# Patient Record
Sex: Male | Born: 1960 | Race: White | Hispanic: No | Marital: Single
Health system: Midwestern US, Community
[De-identification: ages and names within clinical notes are randomized; demographics above are authoritative.]

## PROBLEM LIST (undated history)

## (undated) DIAGNOSIS — R195 Other fecal abnormalities: Secondary | ICD-10-CM

## (undated) DIAGNOSIS — I10 Essential (primary) hypertension: Secondary | ICD-10-CM

## (undated) DIAGNOSIS — E039 Hypothyroidism, unspecified: Secondary | ICD-10-CM

## (undated) DIAGNOSIS — N189 Chronic kidney disease, unspecified: Secondary | ICD-10-CM

## (undated) DIAGNOSIS — Z720 Tobacco use: Secondary | ICD-10-CM

## (undated) DIAGNOSIS — T4145XA Adverse effect of unspecified anesthetic, initial encounter: Secondary | ICD-10-CM

## (undated) DIAGNOSIS — R918 Other nonspecific abnormal finding of lung field: Secondary | ICD-10-CM

## (undated) DIAGNOSIS — J9601 Acute respiratory failure with hypoxia: Secondary | ICD-10-CM

## (undated) DIAGNOSIS — K859 Acute pancreatitis without necrosis or infection, unspecified: Secondary | ICD-10-CM

## (undated) DIAGNOSIS — K602 Anal fissure, unspecified: Secondary | ICD-10-CM

## (undated) DIAGNOSIS — M1990 Unspecified osteoarthritis, unspecified site: Secondary | ICD-10-CM

## (undated) DIAGNOSIS — K603 Anal fistula: Secondary | ICD-10-CM

## (undated) DIAGNOSIS — F419 Anxiety disorder, unspecified: Secondary | ICD-10-CM

## (undated) DIAGNOSIS — K611 Rectal abscess: Secondary | ICD-10-CM

## (undated) DIAGNOSIS — K429 Umbilical hernia without obstruction or gangrene: Secondary | ICD-10-CM

## (undated) DIAGNOSIS — G4733 Obstructive sleep apnea (adult) (pediatric): Secondary | ICD-10-CM

## (undated) DIAGNOSIS — IMO0001 Reserved for inherently not codable concepts without codable children: Secondary | ICD-10-CM

## (undated) DIAGNOSIS — J189 Pneumonia, unspecified organism: Secondary | ICD-10-CM

## (undated) DIAGNOSIS — T8859XA Other complications of anesthesia, initial encounter: Secondary | ICD-10-CM

## (undated) DIAGNOSIS — T7840XA Allergy, unspecified, initial encounter: Secondary | ICD-10-CM

## (undated) DIAGNOSIS — J449 Chronic obstructive pulmonary disease, unspecified: Secondary | ICD-10-CM

## (undated) DIAGNOSIS — Z902 Acquired absence of lung [part of]: Secondary | ICD-10-CM

## (undated) DIAGNOSIS — C801 Malignant (primary) neoplasm, unspecified: Secondary | ICD-10-CM

## (undated) HISTORY — PX: COLONOSCOPY: SHX174

## (undated) HISTORY — PX: SPINE SURGERY: SHX786

## (undated) HISTORY — PX: FACIAL COSMETIC SURGERY: SHX629

## (undated) HISTORY — PX: ABSCESS DRAINAGE: SHX1119

## (undated) HISTORY — PX: KNEE ARTHROSCOPY: SHX127

## (undated) HISTORY — PX: APPENDECTOMY: SHX54

## (undated) HISTORY — PX: CERVICAL FUSION: SHX112

## (undated) HISTORY — PX: INCISION AND DRAINAGE PERIRECTAL ABSCESS: SHX1804

## (undated) HISTORY — PX: HERNIA REPAIR: SHX51

## (undated) HISTORY — DX: Allergy, unspecified, initial encounter: T78.40XA

## (undated) HISTORY — DX: Tobacco use: Z72.0

---

## 1998-11-09 ENCOUNTER — Encounter: Payer: Self-pay | Admitting: *Deleted

## 1998-11-09 ENCOUNTER — Inpatient Hospital Stay (HOSPITAL_COMMUNITY): Admission: EM | Admit: 1998-11-09 | Discharge: 1998-11-11 | Payer: Self-pay | Admitting: Emergency Medicine

## 1998-11-16 ENCOUNTER — Ambulatory Visit (HOSPITAL_COMMUNITY): Admission: RE | Admit: 1998-11-16 | Discharge: 1998-11-16 | Payer: Self-pay | Admitting: Urology

## 1998-11-16 ENCOUNTER — Encounter (INDEPENDENT_AMBULATORY_CARE_PROVIDER_SITE_OTHER): Payer: Self-pay

## 1999-11-02 ENCOUNTER — Emergency Department (HOSPITAL_COMMUNITY): Admission: EM | Admit: 1999-11-02 | Discharge: 1999-11-02 | Payer: Self-pay | Admitting: Emergency Medicine

## 2000-01-30 ENCOUNTER — Encounter: Payer: Self-pay | Admitting: Emergency Medicine

## 2000-01-30 ENCOUNTER — Inpatient Hospital Stay (HOSPITAL_COMMUNITY): Admission: EM | Admit: 2000-01-30 | Discharge: 2000-02-01 | Payer: Self-pay | Admitting: Emergency Medicine

## 2000-01-31 ENCOUNTER — Encounter: Payer: Self-pay | Admitting: Surgery

## 2000-10-16 ENCOUNTER — Inpatient Hospital Stay (HOSPITAL_COMMUNITY): Admission: EM | Admit: 2000-10-16 | Discharge: 2000-10-18 | Payer: Self-pay | Admitting: Psychiatry

## 2000-10-22 ENCOUNTER — Other Ambulatory Visit (HOSPITAL_COMMUNITY): Admission: RE | Admit: 2000-10-22 | Discharge: 2000-11-30 | Payer: Self-pay | Admitting: Psychiatry

## 2001-05-01 ENCOUNTER — Emergency Department (HOSPITAL_COMMUNITY): Admission: EM | Admit: 2001-05-01 | Discharge: 2001-05-01 | Payer: Self-pay | Admitting: Emergency Medicine

## 2001-05-01 ENCOUNTER — Encounter: Payer: Self-pay | Admitting: Emergency Medicine

## 2001-07-16 ENCOUNTER — Encounter: Payer: Self-pay | Admitting: Emergency Medicine

## 2001-07-16 ENCOUNTER — Emergency Department (HOSPITAL_COMMUNITY): Admission: EM | Admit: 2001-07-16 | Discharge: 2001-07-16 | Payer: Self-pay | Admitting: Emergency Medicine

## 2001-08-29 ENCOUNTER — Emergency Department (HOSPITAL_COMMUNITY): Admission: EM | Admit: 2001-08-29 | Discharge: 2001-08-29 | Payer: Self-pay | Admitting: *Deleted

## 2001-09-06 ENCOUNTER — Emergency Department (HOSPITAL_COMMUNITY): Admission: EM | Admit: 2001-09-06 | Discharge: 2001-09-06 | Payer: Self-pay | Admitting: Emergency Medicine

## 2001-10-04 ENCOUNTER — Ambulatory Visit (HOSPITAL_COMMUNITY): Admission: RE | Admit: 2001-10-04 | Discharge: 2001-10-05 | Payer: Self-pay | Admitting: Neurosurgery

## 2001-10-04 ENCOUNTER — Encounter: Payer: Self-pay | Admitting: Neurosurgery

## 2001-11-05 ENCOUNTER — Emergency Department (HOSPITAL_COMMUNITY): Admission: EM | Admit: 2001-11-05 | Discharge: 2001-11-05 | Payer: Self-pay | Admitting: Emergency Medicine

## 2001-11-05 ENCOUNTER — Encounter: Payer: Self-pay | Admitting: Emergency Medicine

## 2002-03-27 ENCOUNTER — Emergency Department (HOSPITAL_COMMUNITY): Admission: EM | Admit: 2002-03-27 | Discharge: 2002-03-27 | Payer: Self-pay | Admitting: *Deleted

## 2002-07-15 ENCOUNTER — Encounter: Payer: Self-pay | Admitting: Emergency Medicine

## 2002-07-15 ENCOUNTER — Emergency Department (HOSPITAL_COMMUNITY): Admission: EM | Admit: 2002-07-15 | Discharge: 2002-07-15 | Payer: Self-pay | Admitting: Emergency Medicine

## 2002-07-19 ENCOUNTER — Emergency Department (HOSPITAL_COMMUNITY): Admission: EM | Admit: 2002-07-19 | Discharge: 2002-07-19 | Payer: Self-pay | Admitting: Emergency Medicine

## 2002-07-20 ENCOUNTER — Emergency Department (HOSPITAL_COMMUNITY): Admission: EM | Admit: 2002-07-20 | Discharge: 2002-07-20 | Payer: Self-pay | Admitting: Internal Medicine

## 2002-07-21 ENCOUNTER — Emergency Department (HOSPITAL_COMMUNITY): Admission: EM | Admit: 2002-07-21 | Discharge: 2002-07-21 | Payer: Self-pay | Admitting: Emergency Medicine

## 2002-07-22 ENCOUNTER — Emergency Department (HOSPITAL_COMMUNITY): Admission: EM | Admit: 2002-07-22 | Discharge: 2002-07-22 | Payer: Self-pay | Admitting: Emergency Medicine

## 2002-07-23 ENCOUNTER — Emergency Department (HOSPITAL_COMMUNITY): Admission: EM | Admit: 2002-07-23 | Discharge: 2002-07-23 | Payer: Self-pay | Admitting: *Deleted

## 2002-07-23 ENCOUNTER — Encounter (HOSPITAL_COMMUNITY): Admission: RE | Admit: 2002-07-23 | Discharge: 2002-08-22 | Payer: Self-pay | Admitting: Emergency Medicine

## 2003-01-27 ENCOUNTER — Emergency Department (HOSPITAL_COMMUNITY): Admission: EM | Admit: 2003-01-27 | Discharge: 2003-01-28 | Payer: Self-pay

## 2003-01-27 ENCOUNTER — Emergency Department (HOSPITAL_COMMUNITY): Admission: EM | Admit: 2003-01-27 | Discharge: 2003-01-27 | Payer: Self-pay | Admitting: Emergency Medicine

## 2003-01-28 ENCOUNTER — Emergency Department (HOSPITAL_COMMUNITY): Admission: EM | Admit: 2003-01-28 | Discharge: 2003-01-28 | Payer: Self-pay | Admitting: Emergency Medicine

## 2003-05-12 ENCOUNTER — Ambulatory Visit (HOSPITAL_COMMUNITY): Admission: RE | Admit: 2003-05-12 | Discharge: 2003-05-12 | Payer: Self-pay | Admitting: Unknown Physician Specialty

## 2003-05-14 ENCOUNTER — Ambulatory Visit (HOSPITAL_COMMUNITY): Admission: RE | Admit: 2003-05-14 | Discharge: 2003-05-14 | Payer: Self-pay | Admitting: Internal Medicine

## 2004-09-02 ENCOUNTER — Emergency Department (HOSPITAL_COMMUNITY): Admission: EM | Admit: 2004-09-02 | Discharge: 2004-09-02 | Payer: Self-pay | Admitting: *Deleted

## 2006-02-23 ENCOUNTER — Emergency Department (HOSPITAL_COMMUNITY): Admission: EM | Admit: 2006-02-23 | Discharge: 2006-02-23 | Payer: Self-pay | Admitting: Emergency Medicine

## 2006-04-19 ENCOUNTER — Emergency Department (HOSPITAL_COMMUNITY): Admission: EM | Admit: 2006-04-19 | Discharge: 2006-04-19 | Payer: Self-pay | Admitting: Emergency Medicine

## 2006-07-07 ENCOUNTER — Emergency Department (HOSPITAL_COMMUNITY): Admission: EM | Admit: 2006-07-07 | Discharge: 2006-07-07 | Payer: Self-pay | Admitting: Emergency Medicine

## 2006-09-16 ENCOUNTER — Emergency Department (HOSPITAL_COMMUNITY): Admission: EM | Admit: 2006-09-16 | Discharge: 2006-09-17 | Payer: Self-pay | Admitting: Emergency Medicine

## 2006-09-26 ENCOUNTER — Emergency Department (HOSPITAL_COMMUNITY): Admission: EM | Admit: 2006-09-26 | Discharge: 2006-09-26 | Payer: Self-pay | Admitting: Emergency Medicine

## 2006-10-01 ENCOUNTER — Emergency Department (HOSPITAL_COMMUNITY): Admission: EM | Admit: 2006-10-01 | Discharge: 2006-10-01 | Payer: Self-pay | Admitting: Emergency Medicine

## 2006-10-30 ENCOUNTER — Emergency Department (HOSPITAL_COMMUNITY): Admission: EM | Admit: 2006-10-30 | Discharge: 2006-10-30 | Payer: Self-pay | Admitting: Emergency Medicine

## 2007-06-04 ENCOUNTER — Emergency Department (HOSPITAL_COMMUNITY): Admission: EM | Admit: 2007-06-04 | Discharge: 2007-06-04 | Payer: Self-pay | Admitting: Emergency Medicine

## 2007-06-05 ENCOUNTER — Emergency Department (HOSPITAL_COMMUNITY): Admission: EM | Admit: 2007-06-05 | Discharge: 2007-06-05 | Payer: Self-pay | Admitting: Emergency Medicine

## 2007-06-09 ENCOUNTER — Ambulatory Visit (HOSPITAL_COMMUNITY): Admission: RE | Admit: 2007-06-09 | Discharge: 2007-06-09 | Payer: Self-pay | Admitting: Emergency Medicine

## 2007-12-09 ENCOUNTER — Emergency Department (HOSPITAL_COMMUNITY): Admission: EM | Admit: 2007-12-09 | Discharge: 2007-12-09 | Payer: Self-pay | Admitting: Emergency Medicine

## 2009-08-15 ENCOUNTER — Inpatient Hospital Stay (HOSPITAL_COMMUNITY): Admission: EM | Admit: 2009-08-15 | Discharge: 2009-08-19 | Payer: Self-pay | Admitting: Cardiovascular Disease

## 2010-01-30 ENCOUNTER — Encounter: Payer: Self-pay | Admitting: General Surgery

## 2010-03-25 LAB — LIPID PANEL
Total CHOL/HDL Ratio: 3.1 RATIO
Triglycerides: 172 mg/dL — ABNORMAL HIGH (ref <150)
VLDL: 34 mg/dL (ref 0–40)

## 2010-03-25 LAB — CARDIAC PANEL(CRET KIN+CKTOT+MB+TROPI)
CK, MB: 5.3 ng/mL — ABNORMAL HIGH (ref 0.3–4.0)
CK, MB: 7.9 ng/mL (ref 0.3–4.0)
Relative Index: 1.5 (ref 0.0–2.5)
Troponin I: 0.01 ng/mL (ref 0.00–0.06)

## 2010-03-25 LAB — URINALYSIS, ROUTINE W REFLEX MICROSCOPIC
Bilirubin Urine: NEGATIVE
Glucose, UA: NEGATIVE mg/dL
Nitrite: NEGATIVE
Specific Gravity, Urine: >1.03 — ABNORMAL HIGH (ref 1.005–1.030)
pH: 5.5 (ref 5.0–8.0)

## 2010-03-25 LAB — MRSA PCR SCREENING: MRSA by PCR: NEGATIVE

## 2010-03-25 LAB — CBC
HCT: 37.1 % — ABNORMAL LOW (ref 39.0–52.0)
HCT: 38.3 % — ABNORMAL LOW (ref 39.0–52.0)
HCT: 39.7 % (ref 39.0–52.0)
Hemoglobin: 13 g/dL (ref 13.0–17.0)
Hemoglobin: 13.2 g/dL (ref 13.0–17.0)
Hemoglobin: 14.1 g/dL (ref 13.0–17.0)
MCH: 32.7 pg (ref 26.0–34.0)
MCH: 33.2 pg (ref 26.0–34.0)
MCH: 33.5 pg (ref 26.0–34.0)
MCH: 33.8 pg (ref 26.0–34.0)
MCHC: 34.5 g/dL (ref 30.0–36.0)
MCV: 94.8 fL (ref 78.0–100.0)
MCV: 95.4 fL (ref 78.0–100.0)
Platelets: 190 10*3/uL (ref 150–400)
RBC: 3.92 MIL/uL — ABNORMAL LOW (ref 4.22–5.81)
RBC: 4.04 MIL/uL — ABNORMAL LOW (ref 4.22–5.81)
RBC: 4.15 MIL/uL — ABNORMAL LOW (ref 4.22–5.81)
RBC: 4.16 MIL/uL — ABNORMAL LOW (ref 4.22–5.81)
RDW: 13.2 % (ref 11.5–15.5)
WBC: 7.5 10*3/uL (ref 4.0–10.5)

## 2010-03-25 LAB — DIFFERENTIAL
Eosinophils Absolute: 0.6 10*3/uL (ref 0.0–0.7)
Eosinophils Relative: 7 % — ABNORMAL HIGH (ref 0–5)
Lymphs Abs: 2.1 10*3/uL (ref 0.7–4.0)
Monocytes Relative: 11 % (ref 3–12)
Neutrophils Relative %: 57 % (ref 43–77)

## 2010-03-25 LAB — BLOOD GAS, ARTERIAL
Acid-base deficit: 0.3 mmol/L (ref 0.0–2.0)
O2 Saturation: 96.9 %
pCO2 arterial: 37.3 mmHg (ref 35.0–45.0)
pO2, Arterial: 85 mmHg (ref 80.0–100.0)

## 2010-03-25 LAB — BASIC METABOLIC PANEL
BUN: 15 mg/dL (ref 6–23)
CO2: 24 mEq/L (ref 19–32)
Calcium: 8.5 mg/dL (ref 8.4–10.5)
Chloride: 103 mEq/L (ref 96–112)
Creatinine, Ser: 1.24 mg/dL (ref 0.4–1.5)
GFR calc Af Amer: >60 mL/min (ref >60)
GFR calc non Af Amer: >60 mL/min (ref >60)
GFR calc non Af Amer: >60 mL/min (ref >60)
Glucose, Bld: 102 mg/dL — ABNORMAL HIGH (ref 70–99)
Potassium: 3.6 mEq/L (ref 3.5–5.1)
Potassium: 3.8 mEq/L (ref 3.5–5.1)
Sodium: 134 mEq/L — ABNORMAL LOW (ref 135–145)
Sodium: 138 mEq/L (ref 135–145)
Sodium: 138 mEq/L (ref 135–145)

## 2010-03-25 LAB — D-DIMER, QUANTITATIVE: D-Dimer, Quant: <0.22 ug/mL-FEU (ref 0.00–0.48)

## 2010-03-25 LAB — RAPID URINE DRUG SCREEN, HOSP PERFORMED
Cocaine: NOT DETECTED
Opiates: NOT DETECTED
Tetrahydrocannabinol: NOT DETECTED

## 2010-03-25 LAB — POCT CARDIAC MARKERS
Troponin i, poc: <0.05 ng/mL (ref 0.00–0.09)
Troponin i, poc: <0.05 ng/mL (ref 0.00–0.09)

## 2010-03-25 LAB — GLUCOSE, CAPILLARY
Glucose-Capillary: 151 mg/dL — ABNORMAL HIGH (ref 70–99)
Glucose-Capillary: 88 mg/dL (ref 70–99)

## 2010-03-25 LAB — PROTIME-INR
INR: 0.97 (ref 0.00–1.49)
Prothrombin Time: 13.1 seconds (ref 11.6–15.2)

## 2010-03-25 LAB — MAGNESIUM: Magnesium: 2.1 mg/dL (ref 1.5–2.5)

## 2010-03-25 LAB — TSH: TSH: 9.054 u[IU]/mL — ABNORMAL HIGH (ref 0.350–4.500)

## 2010-03-25 LAB — T3: T3, Total: 116.2 ng/dl (ref 80.0–204.0)

## 2010-03-25 LAB — HEMOGLOBIN A1C: Hgb A1c MFr Bld: 6.3 % — ABNORMAL HIGH (ref <5.7)
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
## 2010-05-27 NOTE — Op Note (Signed)
NAME:  Fernando [name], Fernando [name]                           ACCOUNT NO.:  [id number]   MEDICAL RECORD NO.:  [id number]                   PATIENT TYPE:  OIB   LOCATION:  3021                                 FACILITY:  [facility]   PHYSICIAN:  Kyle L. Franky Macho, M.D.               DATE OF BIRTH:  [birth year]   DATE OF PROCEDURE:  10/04/2001  DATE OF DISCHARGE:  10/05/2001                                 OPERATIVE REPORT   PREOPERATIVE DIAGNOSES:  1. Displaced disk, C6-7 right.  2. Right C7 radiculopathy.   POSTOPERATIVE DIAGNOSES:  1. Displaced disk, C6-7 right.  2. Right C7 radiculopathy.   OPERATION PERFORMED:  Anterior cervical decompression, C6-7; arthrodesis, C6-  7; anterior instrumentation Synthes 16 mm plate.   COMPLICATIONS:  None.   SURGEON:  Kyle L. Franky Macho, M.D.   ASSISTANT:  Clydene Fake, M.D.   ANESTHESIA:  General endotracheal.   INDICATIONS FOR PROCEDURE:  The patient is a 50 year old gentleman with  weakness in the right triceps, a large herniated disk at C6-7 on the right  and pain in the right upper extremity.  I recommended and he has agreed to  undergo operative decompression.   DESCRIPTION OF PROCEDURE:  The patient was brought to the operating room,  intubated, placed under general anesthesia without difficulty.  His neck was  placed in slight extension on a horseshoe head rest with 10 pounds of  traction applied to the chin strap.  His neck was prepped and he was draped  in sterile fashion.  I infiltrated 2cc 0.5% lidocaine and 1:200,000 strength  epinephrine.  I opened the incision with a #10 blade and took this down to  the platysma.  I dissected above the platysma.  I then opened the platysma  in a horizontal fashion using Metzenbaum scissors.  I then dissected  inferior to the platysma both proximally and caudally.  I developed an  avascular plane between the sternocleidomastoid muscle and the medial strap  muscles.  I placed a spinal needle and I was at the  C6-7 disk space.  I  reflected the longus colli muscles laterally and placed a self-retaining  Caspar retractor.  I placed two distraction pins, one in C6, the other one  in C7.  I used a 15 blade to open the disk space and removed a small amount  of disk.  I then distracted the disk space.  I then used the microscope,  high speed air drill, pituitary rongeur and Kerrison punches.  This was done  and I was able to remove what was large fragments of disk on the right side  compressing the right C7 nerve root.  I decompressed that nerve root until  it was seen and I palpated the neural foramen and it was widely patent.  I  then decompressed the left side although he was not symptomatic and  therefore I did not do an  aggressive decompression.  I placed an 8 mm graft  into the disk space after it was empty.  I irrigated the wound.  Dr. Phoebe Perch  assisted with the arthrodesis and anterior plating.  A 60 mm plate was sized  and then I placed four screws, three 14 x 4 mm screws, one rescue screw  placed on the right side in C7.  Locking screws were placed.  X-ray was  taken.  Plate screws and plug were all in good position.  The patient  tolerated the procedure without  difficulty.  The wound was then closed in a layered fashion, reapproximating  the platysma, then subcutaneous tissues.  Skin edges __________ also with  Vicryl sutures.  Dermabond was used for sterile dressing.  The patient was  then extubated moving all extremities.                                               Kyle L. Franky Macho, M.D.    Luna Kitchens  D:  10/04/2001  T:  10/07/2001  Job:  [id number]

## 2010-05-27 NOTE — H&P (Signed)
NAME:  [name], Fernando                           ACCOUNT NO.:  [id number]   MEDICAL RECORD NO.:  [id number]                   PATIENT TYPE:  AMB   LOCATION:  DAY                                  FACILITY:  [facility]   PHYSICIAN:  Jerolyn Shin C. Katrinka Blazing, M.D.                DATE OF BIRTH:  [birth year]   DATE OF ADMISSION:  DATE OF DISCHARGE:                                HISTORY & PHYSICAL   HISTORY OF PRESENT ILLNESS:  [age]-year-old male with a strong family  history of colon cancer.  He has three aunts with colon cancer in their 65s  and late 81a.  His mother had colon cancer at age 48.  The patient presents  with a history of constant diarrhea.  There has not been any weight loss.  He has had a good appetite.  He denies rectal bleeding.  He is scheduled or  colonoscopy.   PAST HISTORY:  The patient has no major medical illnesses.   PAST SURGICAL HISTORY:  1. I&D of perirectal abscess.  2. Umbilical hernia repair.   REVIEW OF SYSTEMS:  The patient has chronic atopic dermatitis.  He has  chronic swelling of the left lower extremity.   MEDICATIONS:  Ibuprofen p.r.n.   PHYSICAL EXAMINATION:  VITAL SIGNS:  Blood pressure 120/80, pulse 84,  respirations 18 and weight 230 pounds.  HEENT:  The HEENT is unremarkable.  NECK:  Neck is supple with no JVD or bruits.  CHEST:  Chest is clear to auscultation.  HEART:  Regular rate and rhythm without murmur, gallop or rub.  ABDOMEN:  Abdomen is soft and nontender.  No masses.  RECTAL:  Tender anal canal, but no masses.  Stool guaiac positive.  EXTREMITIES:  Tenderness in the left knee without effusion.  Three plus  edema, left leg down to the ankle.  NEUROLOGIC EXAMINATION:  No focal motor, sensory or cerebellar deficits.   Him  1. Strong family history of colon cancer.  2. Chronic diarrhea.  3. Guaiac positive stools.   PLAN:  Screening colonoscopy.    ___________________________________________                                         Dirk Dress. Katrinka Blazing, M.D.   LCS/MEDQ  D:  05/13/2003  T:  05/14/2003  Job:  [id number]

## 2010-05-27 NOTE — Discharge Summary (Signed)
[facility]  Patient:    APOLONIO, CUTTING Visit Number: [id number] MRN: [id number]          Service Type: PSY Location: [facility] Attending Physician:  Rachael Fee Dictated by:   Reymundo Poll Dub Mikes, M.D. Admit Date:  10/22/2000 Discharge Date: 11/30/2000                             Discharge Summary  CHIEF COMPLAINT AND PRESENTING ILLNESS:  This was the first admission to [facility] for this 50 year old male voluntarily admitted for alcohol and drug abuse, requesting detox.  Has a long history of polysubstance abuse and dependence, has had enough and had hit "rock bottom."  Passed out at a neighbors house.  His 15 year old asked him to come home.  He wanted to do well for his family and himself.  Denied any depression or suicidal ideas, no homicidal ideas, no psychotic symptoms.  He binge drinks, gets very mean, has been using Xanax and marijuana.  He sleeps fair, decreased appetite for the past 4 days.  He is very tormented as to what happened, wanted to get himself and his family back to church, reports he tries to stay 3 steps ahead at work, reports he is always fresh urine in case he needs to do a random drug test. Is sneaking around all the time and experiencing medical problems regarding his alcohol and drug use.  PAST PSYCHIATRIC HISTORY:  1996 he was detoxed from opiates and depression.  SUBSTANCE ABUSE HISTORY:  Binge drinks 1 or 2 nights per week, last drink was on Friday before the admission, has been smoking marijuana since 15 every day, has also been abusing Xanax.  He used 50 over a couple of days and flushed the rest.  MEDICATIONS:  None.  PHYSICAL EXAMINATION:  Physical examination performed at [facility], failed to show any active findings.  MENTAL STATUS EXAMINATION:  Reveals a well-nourished, well-developed, alert, cooperative male.  Speech was normal but pressured.  Mood was angry and depressed.  Affect was  depressed.  Patient cried hard at certain times. Thought processes are coherent, very focused on maintaining sobriety in regards to his family.  No evidence of psychosis, no auditory or visual hallucinations, no suicidal or homicidal ideas.  Some questionable paranoia. He trusts no one, as he keeps people from knowing his drug habit.  Cognition well preserved.  ADMITTING  DIAGNOSES: Axis I:    1. Alcohol and marijuana and benzodiazepine dependence.            2. Depressive disorder not otherwise specified. Axis II:   No diagnosis. Axis III:  No diagnosis. Axis IV:   Moderate. Axis V:    Global assessment of function upon admission 35, highest            global assessment of function in past year 70-75.  LABORATORY WORK-UP:  Blood chemistries, SGOT was 49, SGPT 83.  Thyroid profile, T3 38.7, TSH 6.11.  COURSE IN THE HOSPITAL:  He was admitted and started on intensive individual and group psychotherapy.  He was detoxified with phenobarbital.  He was wanting to be discharged.  We had a session with him and his wife who was very supportive.  He wanted to be discharged and go on with his life, was willing to come to the [facility] program.  He did show commitment to sobriety.  He was given phenobarbital 15 mg to continue  detox at home.  He was going to give his medication to his wife.  He was going to do one 3 times a day on October 19, 2000, and one twice a day on October 20, 2000.  DISCHARGE  DIAGNOSES: Axis I:    Alcohol dependency. Axis II:   No diagnosis. Axis III:  No diagnosis. Axis IV:   Moderate. Axis V:    Global assessment of function upon discharge 55-60.  DISCHARGE MEDICATIONS: 1. Trazodone 100 mg at bedtime as needed for sleep. 2. Phenobarbital 15 1 3  times a day for 1 day, then 1 twice a day for    another day, then discontinue.  DISPOSITION:  To start outpatient treatment at the [facility] program at [facility]. Dictated by:   Reymundo Poll Dub Mikes,  M.D. Attending Physician:  Rachael Fee DD:  01/14/01 TD:  01/14/01 Job: [id number]

## 2010-05-27 NOTE — Op Note (Signed)
[facility]  Patient:    Fernando [name], Fernando [name]                        MRN: [id number] Proc. Date: 01/31/00 Adm. Date:  [id number] Attending:  Bonnetta Barry                           Operative Report  PREOPERATIVE DIAGNOSIS:  History of incarcerated umbilical hernia.  POSTOPERATIVE DIAGNOSIS:  History of incarcerated umbilical hernia.  OPERATION:  Repair of umbilical hernia with mesh.  SURGEON:  Thornton Park. Daphine Deutscher, M.D.  ANESTHESIA:  General  INDICATION FOR PROCEDURE:  [name] is a 50 year old gentleman who is admitted on January 30, 2000 by Dr. Gerrit Friends with incarcerated umbilical hernia. He was having tenderness.   A CT scan showed the small umbilical hernia was otherwise normal.  Informed consent was obtained regarding repair with mesh. Realizing the recurrence rate there is an incidence of recurrence.  DESCRIPTION OF PROCEDURE:  Mr. Towson was taken to OR #15 and given general anesthesia.  The abdomen was prepped with Betadine and draped sterilely.  A curvilinear incision was made below the umbilicus and a flap was raised.  I took the umbilical skin off of the hernia and found a 2 cm in diameter transverse oriented fascial defect.  I elected to close it transversely.  I put a piece of mesh in which I sutured in place with two sutures of 0 Prolene at either end about a centimeter end off the edge of the rim and that held this elliptical piece of mesh in place.  I then closed this transversely in a simple fashion with multiple 0 Prolenes again incorporating fascia and mesh and then fascia and tying these down.  This completely obliterated the defect. The area was then irrigated.  The umbilical skin was tacked to the fascia with 4-0 Vicryl.  The skin was closed with 5-0 Vicryl subcuticularly and with Benzoin and Steri-Strips.  The patient tolerated the procedure well and was taken to the recovery room in satisfactory condition. DD:   01/31/00 TD:  01/31/00 Job: [id number]

## 2010-05-27 NOTE — H&P (Signed)
[facility]  Patient:    Fernando [name], Fernando [name] Visit Number: [id number] MRN: [id number]          Service Type: PSY Location: 50 [id number] [id number] Attending Physician:  Rachael Fee Dictated by:   Candi Leash. Orsini, N.P. Admit Date:  10/16/2000                     Psychiatric Admission Assessment  IDENTIFYING INFORMATION:  This is a 50 year old married white male voluntarily admitted on October 16, 2000 for alcohol and drug abuse requesting detox.  HISTORY OF PRESENT ILLNESS:  The patient presents with a long history of polysubstance abuse, wanting to be detoxed.  He reports he "has had enough and has hit rock bottom."  The patient had passed out at neighbors house.  His 51-year-old had asked him to come home.  He wants to do well for his family and himself.  He denies any depression or suicidal ideation.  No homicidal ideation.  Denies any psychotic symptoms.  He states he binge drinks, gets very mean, has been using Xanax and THC.  He reports he sleeps fair.  He has had decreased appetite for the past four days.  He feels very tormented as to what happened.  He wants to get himself and his family back to church. He reports he tries to stay three steps ahead at work, reporting always having fresh urine in case he needs to do a random drug test.  He is tired of sneaking around all the time and experiencing medical problems in regards to his alcohol and drug use.  PAST PSYCHIATRIC HISTORY:  In 1996, he was detoxed from opiates and depression.  No outpatient treatment.  SOCIAL HISTORY:  This is a 50 year old married white male.  Married for seven years, four children, ages 65, 52 and 104 and 81.  Lives with his wife and three children.  He is a [employer] architect.  No legal problems.  FAMILY HISTORY:  None.  ALCOHOL/DRUG HISTORY:  He is a nonsmoker.  He binge drinks about one or two nights per week.  Last drink was on Friday night.  He has been smoking marijuana since the  age of 42 every day.  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  Has been also using Xanax.  Got a prescription.  Used 50 over a couple of days and flushed the rest of the medication.  PRIMARY CARE PHYSICIAN:  Dr. Stephannie Peters in [place].  MEDICAL PROBLEMS:  None.  MEDICATIONS:  None.  DRUG ALLERGIES:  No known drug allergies.  PHYSICAL EXAMINATION:  Performed at [facility].  LABORATORY DATA:  Urine drug screen was positive for benzodiazepines, positive for THC.  Alcohol level was 3.  CBC and CMET were within normal limits.  The patient denies any complaints.  MENTAL STATUS EXAMINATION:  He is an alert, young, middle-aged Caucasian male. Casually dressed, neat.  Speech is normal and pressured.  Mood is angry and depressed.  Affect is depressed.  The patient cries hard at certain intervals. Thought processes are coherent.  The patient is very focused on maintaining sobriety in regards to his family.  There is no evidence of psychosis.  No auditory or visual hallucinations.  No suicidal or homicidal ideation.  There is some questionable paranoia.  The patient states he trusts noone as he tries to keep people from knowing his drug habits.  Cognitive function is intact. Memory is fair.  Judgment is fair.  Insight is good.  DIAGNOSES: Axis I:    1. Depression not otherwise specified.            2. Polysubstance abuse. Axis II:   Deferred. Axis III:  None. Axis IV:   Problems with primary support group and other psychosocial problems            related to alcohol and drug use. Axis V:    Current 35; past year 31-75.  PLAN:  Voluntary admission to [facility] for depression and polysubstance abuse.  Contract for safety.  Check every 15 minutes.  The patient promises safety.  Will initiate the phenobarbital protocol.  The patient to attend groups.  Have trazodone available for sleep.  Will consider marital session for support.  Monitor signs and symptoms of depression.   Our goal is to return patient to prior living arrangement, to detox safely, to increase his coping skills, to follow up with mental health, AA and NA meetings.  TENTATIVE LENGTH OF STAY:  Three to four days. Dictated by:   Candi Leash. Orsini, N.P. Attending Physician:  Rachael Fee DD:  10/17/00 TD:  10/17/00 Job: [id number]

## 2010-08-21 ENCOUNTER — Encounter: Payer: Self-pay | Admitting: Emergency Medicine

## 2010-08-21 ENCOUNTER — Emergency Department (HOSPITAL_COMMUNITY): Payer: Self-pay

## 2010-08-21 ENCOUNTER — Emergency Department (HOSPITAL_COMMUNITY)
Admission: EM | Admit: 2010-08-21 | Discharge: 2010-08-21 | Disposition: A | Discharge status: Home / Self Care | Payer: Self-pay | Attending: Emergency Medicine | Admitting: Emergency Medicine

## 2010-08-21 DIAGNOSIS — X500XXA Overexertion from strenuous movement or load, initial encounter: Secondary | ICD-10-CM | POA: Insufficient documentation

## 2010-08-21 DIAGNOSIS — S8000XA Contusion of unspecified knee, initial encounter: Secondary | ICD-10-CM | POA: Insufficient documentation

## 2010-08-21 DIAGNOSIS — S93409A Sprain of unspecified ligament of unspecified ankle, initial encounter: Secondary | ICD-10-CM | POA: Insufficient documentation

## 2010-08-21 DIAGNOSIS — I10 Essential (primary) hypertension: Secondary | ICD-10-CM | POA: Insufficient documentation

## 2010-08-21 DIAGNOSIS — Z7982 Long term (current) use of aspirin: Secondary | ICD-10-CM | POA: Insufficient documentation

## 2010-08-21 DIAGNOSIS — E119 Type 2 diabetes mellitus without complications: Secondary | ICD-10-CM | POA: Insufficient documentation

## 2010-08-21 DIAGNOSIS — F17200 Nicotine dependence, unspecified, uncomplicated: Secondary | ICD-10-CM | POA: Insufficient documentation

## 2010-08-21 HISTORY — DX: Essential (primary) hypertension: I10

## 2010-08-21 MED ORDER — HYDROCODONE-ACETAMINOPHEN 7.5-325 MG PO TABS: 1.0000 | ORAL_TABLET | ORAL | Status: AC | PRN

## 2010-08-21 MED ORDER — MELOXICAM 7.5 MG PO TABS: 7.5000 mg | ORAL_TABLET | Freq: Two times a day (BID) | ORAL | Status: AC

## 2010-08-21 NOTE — ED Provider Notes (Signed)
Medical screening examination/treatment/procedure(s) were performed by non-physician practitioner and as supervising physician I was immediately available for consultation/collaboration.   Laray Anger, DO 08/21/10 2147

## 2010-08-21 NOTE — ED Provider Notes (Signed)
History     CSN: [id number] Arrival date & time: 08/21/2010  1:32 PM  Chief Complaint  Patient presents with  . Knee Pain  . Ankle Pain   HPI Comments: Pt states he twisted the left ankle and injured the left knee last night when he fell carrying groceries.  Patient is a 50 y.o. male presenting with knee pain and ankle pain. The history is provided by the patient.  Knee Pain This is a new problem. The current episode started yesterday. The problem occurs constantly. The problem has been gradually worsening. Associated symptoms include joint swelling. Pertinent negatives include no abdominal pain, arthralgias, chest pain, coughing or neck pain. The symptoms are aggravated by standing and walking. He has tried nothing for the symptoms.  Ankle Pain     Past Medical History  Diagnosis Date  . Diabetes mellitus   . Hypertension     History reviewed. No pertinent past surgical history.  History reviewed. No pertinent family history.  History  Substance Use Topics  . Smoking status: Current Everyday Smoker -- 0.5 packs/day  . Smokeless tobacco: Not on file  . Alcohol Use: Yes      Review of Systems  Constitutional: Negative for activity change.       All ROS Neg except as noted in HPI  HENT: Negative for nosebleeds and neck pain.   Eyes: Negative for photophobia and discharge.  Respiratory: Negative for cough, shortness of breath and wheezing.   Cardiovascular: Negative for chest pain and palpitations.  Gastrointestinal: Negative for abdominal pain and blood in stool.  Genitourinary: Negative for dysuria, frequency and hematuria.  Musculoskeletal: Positive for joint swelling. Negative for back pain and arthralgias.  Skin: Negative.   Neurological: Negative for dizziness, seizures and speech difficulty.  Psychiatric/Behavioral: Negative for hallucinations and confusion.    Physical Exam  BP 138/110  Pulse 105  Temp(Src) 97.7 F (36.5 C) (Oral)  Resp 16  Ht 6\' 2"   (1.88 m)  Wt 245 lb (111.131 kg)  BMI 31.46 kg/m2  SpO2 96%  Physical Exam  Nursing note and vitals reviewed. Constitutional: He is oriented to person, place, and time. He appears well-developed and well-nourished.  Non-toxic appearance.  HENT:  Head: Normocephalic.  Right Ear: Tympanic membrane and external ear normal.  Left Ear: Tympanic membrane and external ear normal.  Eyes: EOM and lids are normal. Pupils are equal, round, and reactive to light.  Neck: Normal range of motion. Neck supple. Carotid bruit is not present.  Cardiovascular: Normal rate, regular rhythm, normal heart sounds, intact distal pulses and normal pulses.   Pulmonary/Chest: Breath sounds normal. No respiratory distress.  Abdominal: Soft. Bowel sounds are normal. There is no tenderness. There is no guarding.  Musculoskeletal:       No pain or deformity of the left hip. Lateral left knee pain extending into the thigh. No significant effusion. Mod crepitus noted. No posterior mass. Pain with attempted ROM of the left ankle. Achilles intact. Distal pulse and sensory wnl.  Lymphadenopathy:       Head (right side): No submandibular adenopathy present.       Head (left side): No submandibular adenopathy present.    He has no cervical adenopathy.  Neurological: He is alert and oriented to person, place, and time. He has normal strength. No cranial nerve deficit or sensory deficit.  Skin: Skin is warm and dry.  Psychiatric: He has a normal mood and affect. His speech is normal.    ED Course  Procedures  MDM I have reviewed nursing notes, vital signs, and all appropriate lab and imaging results for this patient. Suspect ankle sprain and contusion of the Left knee. Will r/o fx.      Kathie Dike, [place] 08/21/10 1423

## 2010-08-21 NOTE — ED Notes (Signed)
Pt complainig of L knee and L ankle pain after fall last night.

## 2010-10-19 LAB — COMPREHENSIVE METABOLIC PANEL
Albumin: 3.5
Alkaline Phosphatase: 98
BUN: 16
Calcium: 8.8
Creatinine, Ser: 1.22
Potassium: 3.3 — ABNORMAL LOW
Total Protein: 6.6

## 2010-10-19 LAB — DIFFERENTIAL
Lymphocytes Relative: 24
Lymphs Abs: 1.8
Monocytes Absolute: 0.8 — ABNORMAL HIGH
Monocytes Relative: 11
Neutro Abs: 4.4

## 2010-10-19 LAB — CBC
HCT: 38.4 — ABNORMAL LOW
Platelets: 204
RDW: 14.2 — ABNORMAL HIGH

## 2010-10-21 LAB — URINALYSIS, DIPSTICK ONLY
Bilirubin Urine: NEGATIVE
Glucose, UA: NEGATIVE
Hgb urine dipstick: NEGATIVE
Ketones, ur: NEGATIVE
Protein, ur: NEGATIVE

## 2011-02-04 ENCOUNTER — Emergency Department (HOSPITAL_COMMUNITY)
Admission: EM | Admit: 2011-02-04 | Discharge: 2011-02-04 | Disposition: A | Discharge status: Home / Self Care | Payer: Self-pay | Attending: Emergency Medicine | Admitting: Emergency Medicine

## 2011-02-04 ENCOUNTER — Emergency Department (HOSPITAL_COMMUNITY): Payer: Self-pay

## 2011-02-04 ENCOUNTER — Encounter (HOSPITAL_COMMUNITY): Payer: Self-pay

## 2011-02-04 DIAGNOSIS — Y92009 Unspecified place in unspecified non-institutional (private) residence as the place of occurrence of the external cause: Secondary | ICD-10-CM | POA: Insufficient documentation

## 2011-02-04 DIAGNOSIS — L03119 Cellulitis of unspecified part of limb: Secondary | ICD-10-CM | POA: Insufficient documentation

## 2011-02-04 DIAGNOSIS — Y998 Other external cause status: Secondary | ICD-10-CM | POA: Insufficient documentation

## 2011-02-04 DIAGNOSIS — Y9323 Activity, snow (alpine) (downhill) skiing, snow boarding, sledding, tobogganing and snow tubing: Secondary | ICD-10-CM | POA: Insufficient documentation

## 2011-02-04 DIAGNOSIS — Z79899 Other long term (current) drug therapy: Secondary | ICD-10-CM | POA: Insufficient documentation

## 2011-02-04 DIAGNOSIS — Z7982 Long term (current) use of aspirin: Secondary | ICD-10-CM | POA: Insufficient documentation

## 2011-02-04 DIAGNOSIS — S0003XA Contusion of scalp, initial encounter: Secondary | ICD-10-CM | POA: Insufficient documentation

## 2011-02-04 DIAGNOSIS — IMO0002 Reserved for concepts with insufficient information to code with codable children: Secondary | ICD-10-CM | POA: Insufficient documentation

## 2011-02-04 DIAGNOSIS — S139XXA Sprain of joints and ligaments of unspecified parts of neck, initial encounter: Secondary | ICD-10-CM | POA: Insufficient documentation

## 2011-02-04 DIAGNOSIS — M2550 Pain in unspecified joint: Secondary | ICD-10-CM | POA: Insufficient documentation

## 2011-02-04 DIAGNOSIS — S058X9A Other injuries of unspecified eye and orbit, initial encounter: Secondary | ICD-10-CM | POA: Insufficient documentation

## 2011-02-04 DIAGNOSIS — R51 Headache: Secondary | ICD-10-CM | POA: Insufficient documentation

## 2011-02-04 DIAGNOSIS — S161XXA Strain of muscle, fascia and tendon at neck level, initial encounter: Secondary | ICD-10-CM

## 2011-02-04 DIAGNOSIS — S0081XA Abrasion of other part of head, initial encounter: Secondary | ICD-10-CM

## 2011-02-04 DIAGNOSIS — L02419 Cutaneous abscess of limb, unspecified: Secondary | ICD-10-CM | POA: Insufficient documentation

## 2011-02-04 DIAGNOSIS — F17200 Nicotine dependence, unspecified, uncomplicated: Secondary | ICD-10-CM | POA: Insufficient documentation

## 2011-02-04 DIAGNOSIS — S1093XA Contusion of unspecified part of neck, initial encounter: Secondary | ICD-10-CM | POA: Insufficient documentation

## 2011-02-04 DIAGNOSIS — L0390 Cellulitis, unspecified: Secondary | ICD-10-CM

## 2011-02-04 DIAGNOSIS — S0501XA Injury of conjunctiva and corneal abrasion without foreign body, right eye, initial encounter: Secondary | ICD-10-CM

## 2011-02-04 MED ORDER — CEPHALEXIN 500 MG PO CAPS: 500.0000 mg | ORAL_CAPSULE | Freq: Two times a day (BID) | ORAL | Status: AC

## 2011-02-04 MED ORDER — DIAZEPAM 5 MG PO TABS: 5.0000 mg | ORAL_TABLET | Freq: Four times a day (QID) | ORAL | Status: AC | PRN

## 2011-02-04 MED ORDER — CEPHALEXIN 500 MG PO CAPS
500.0000 mg | ORAL_CAPSULE | Freq: Once | ORAL | Status: AC
  Administered 2011-02-04: 500 mg via ORAL
  Filled 2011-02-04: qty 1

## 2011-02-04 MED ORDER — HYDROCODONE-ACETAMINOPHEN 5-325 MG PO TABS
2.0000 | ORAL_TABLET | Freq: Once | ORAL | Status: AC
  Administered 2011-02-04: 2 via ORAL
  Filled 2011-02-04: qty 2

## 2011-02-04 MED ORDER — KETOROLAC TROMETHAMINE 60 MG/2ML IM SOLN
60.0000 mg | Freq: Once | INTRAMUSCULAR | Status: AC
  Administered 2011-02-04: 60 mg via INTRAMUSCULAR
  Filled 2011-02-04: qty 2

## 2011-02-04 MED ORDER — FLUORESCEIN SODIUM 1 MG OP STRP
ORAL_STRIP | OPHTHALMIC | Status: AC
  Filled 2011-02-04: qty 1

## 2011-02-04 MED ORDER — TETRACAINE HCL 0.5 % OP SOLN
2.0000 [drp] | Freq: Once | OPHTHALMIC | Status: AC
  Administered 2011-02-04: 2 [drp] via OPHTHALMIC
  Filled 2011-02-04: qty 2

## 2011-02-04 MED ORDER — TOBRAMYCIN-DEXAMETHASONE 0.3-0.1 % OP SUSP
2.0000 [drp] | OPHTHALMIC | Status: DC
  Administered 2011-02-04: 2 [drp] via OPHTHALMIC
  Filled 2011-02-04: qty 2.5

## 2011-02-04 MED ORDER — DIAZEPAM 5 MG PO TABS
5.0000 mg | ORAL_TABLET | Freq: Once | ORAL | Status: AC
  Administered 2011-02-04: 5 mg via ORAL
  Filled 2011-02-04: qty 1

## 2011-02-04 MED ORDER — FLUORESCEIN SODIUM 1 MG OP STRP
1.0000 | ORAL_STRIP | Freq: Once | OPHTHALMIC | Status: AC
  Administered 2011-02-04: 13:00:00 via OPHTHALMIC

## 2011-02-04 MED ORDER — HYDROCODONE-ACETAMINOPHEN 5-325 MG PO TABS: 2.0000 | ORAL_TABLET | ORAL | Status: AC | PRN
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
## 2011-02-06 NOTE — ED Provider Notes (Signed)
Medical screening examination/treatment/procedure(s) were performed by non-physician practitioner and as supervising physician I was immediately available for consultation/collaboration.   Geet Hosking, MD 02/06/11 1044 

## 2011-12-30 ENCOUNTER — Ambulatory Visit: Payer: Self-pay | Admitting: Emergency Medicine

## 2011-12-30 VITALS — BP 167/115 | HR 86 | Temp 98.0°F | Resp 17 | Ht 71.5 in | Wt 238.0 lb

## 2011-12-30 DIAGNOSIS — I10 Essential (primary) hypertension: Secondary | ICD-10-CM

## 2011-12-30 DIAGNOSIS — Z0289 Encounter for other administrative examinations: Secondary | ICD-10-CM

## 2011-12-30 DIAGNOSIS — Z0000 Encounter for general adult medical examination without abnormal findings: Secondary | ICD-10-CM

## 2011-12-30 HISTORY — DX: Essential (primary) hypertension: I10

## 2011-12-30 NOTE — Progress Notes (Signed)
  Subjective:    Patient ID: Fernando [name], male    DOB: 1960/11/04, 51 y.o.   MRN: [id number]  HPI patient here for self a DOT. He put negative all the questions on the questionnaire however he does have a history of hypertension. He's currently on lisinopril 20 mg a day and has not taken medicine for the last 3 days   Review of Systems positive for hypertension     Objective:   Physical Exam HEENT exam is unremarkable except for a suspicious one by one a half centimeter thyroid nodule. His chest was clear. Heart regular rate no murmurs abdomen soft healed periumbilical scar. No hernias felt. Rectal not done extremities without edema full range of motion        Assessment & Plan:  Patient here for DOT exam. I repeated his blood pressure and it was 156/106 and advised him to start back on his lisinopril. He was issued a three-month color. I told him I felt a nodule in his thyroid. I told him this could be a cancer. I told him it was essential that he have an ultrasound and consider biopsy if it turns out to be a solid lesion. He understands this and will see his PCP in [place]  and have this attended to.

## 2012-03-29 ENCOUNTER — Ambulatory Visit: Payer: Self-pay | Admitting: Emergency Medicine

## 2012-03-29 VITALS — BP 132/86 | HR 84 | Temp 98.4°F | Resp 17 | Ht 71.5 in | Wt 241.0 lb

## 2012-03-29 DIAGNOSIS — I10 Essential (primary) hypertension: Secondary | ICD-10-CM

## 2012-03-29 DIAGNOSIS — Z0289 Encounter for other administrative examinations: Secondary | ICD-10-CM

## 2012-03-29 NOTE — Progress Notes (Signed)
[facility] [address], [place] [address] [phone]  Date:  03/29/2012   Name:  Fernando [name]   DOB:  [birth year]   MRN:  [id number]  PCP:  No primary provider on file.    Chief Complaint: Employment Physical   History of Present Illness:  Fernando [name] is a 52 y.o. very pleasant male patient who presents with the following:  Hypertension.  Had DOT in December and has resumed BP medication.  Asymptomatic   Patient Active Problem List  Diagnosis  . Hypertension    Past Medical History  Diagnosis Date  . Hypertension 12/30/2011  . Allergy     Past Surgical History  Procedure Laterality Date  . Cervical fusion    . Facial cosmetic surgery    . Knee arthroscopy    . Appendectomy    . Hernia repair    . Spine surgery      History  Substance Use Topics  . Smoking status: Current Every Day Smoker -- 0.50 packs/day for 8 years    Types: Cigarettes  . Smokeless tobacco: Not on file  . Alcohol Use: No     Comment: occ    History reviewed. No pertinent family history.  Allergies  Allergen Reactions  . Morphine And Related Other (See Comments)    hallucinations    Medication list has been reviewed and updated.  Current Outpatient Prescriptions on File Prior to Visit  Medication Sig Dispense Refill  . aspirin 81 MG chewable tablet Chew 81 mg by mouth daily.        Marland Kitchen lisinopril (PRINIVIL,ZESTRIL) 20 MG tablet Take 20 mg by mouth daily.         No current facility-administered medications on file prior to visit.    Review of Systems:  As per HPI, otherwise negative.    Physical Examination: Filed Vitals:   03/29/12 1030  BP: 132/86  Pulse: 84  Temp: 98.4 F (36.9 C)  Resp: 17   Filed Vitals:   03/29/12 1030  Height: 5' 11.5" (1.816 m)  Weight: 241 lb (109.317 kg)   Body mass index is 33.15 kg/(m^2). Ideal Body Weight: Weight in (lb) to have BMI = 25: 181.4  GEN: WDWN, NAD, Non-toxic, A & O x 3 HEENT: Atraumatic,  Normocephalic. Neck supple. No masses, No LAD. Ears and Nose: No external deformity. CV: RRR, No M/G/R. No JVD. No thrill. No extra heart sounds. PULM: CTA B, no wheezes, crackles, rhonchi. No retractions. No resp. distress. No accessory muscle use. ABD: S, NT, ND, +BS. No rebound. No HSM. EXTR: No c/c/e NEURO Normal gait.  PSYCH: Normally interactive. Conversant. Not depressed or anxious appearing.  Calm demeanor.    Assessment and Plan: Hypertension Card signed for additional 9 months  Signed,  Phillips Odor, MD

## 2012-05-25 ENCOUNTER — Encounter: Payer: Self-pay | Admitting: Emergency Medicine

## 2013-04-28 ENCOUNTER — Encounter (HOSPITAL_COMMUNITY): Payer: Self-pay | Admitting: Emergency Medicine

## 2013-04-28 ENCOUNTER — Inpatient Hospital Stay (HOSPITAL_COMMUNITY)
Admission: EM | Admit: 2013-04-28 | Discharge: 2013-05-01 | DRG: 728 | Disposition: A | Discharge status: Home / Self Care | Payer: 59 | Attending: Internal Medicine | Admitting: Internal Medicine

## 2013-04-28 DIAGNOSIS — N492 Inflammatory disorders of scrotum: Secondary | ICD-10-CM | POA: Diagnosis present

## 2013-04-28 DIAGNOSIS — E860 Dehydration: Secondary | ICD-10-CM | POA: Diagnosis present

## 2013-04-28 DIAGNOSIS — L0390 Cellulitis, unspecified: Secondary | ICD-10-CM | POA: Diagnosis present

## 2013-04-28 DIAGNOSIS — W57XXXA Bitten or stung by nonvenomous insect and other nonvenomous arthropods, initial encounter: Secondary | ICD-10-CM

## 2013-04-28 DIAGNOSIS — F17200 Nicotine dependence, unspecified, uncomplicated: Secondary | ICD-10-CM | POA: Diagnosis present

## 2013-04-28 DIAGNOSIS — I10 Essential (primary) hypertension: Secondary | ICD-10-CM | POA: Diagnosis present

## 2013-04-28 DIAGNOSIS — N498 Inflammatory disorders of other specified male genital organs: Principal | ICD-10-CM | POA: Diagnosis present

## 2013-04-28 DIAGNOSIS — R319 Hematuria, unspecified: Secondary | ICD-10-CM | POA: Diagnosis present

## 2013-04-28 DIAGNOSIS — E785 Hyperlipidemia, unspecified: Secondary | ICD-10-CM | POA: Diagnosis present

## 2013-04-28 LAB — CBC WITH DIFFERENTIAL/PLATELET
BASOS ABS: 0 10*3/uL (ref 0.0–0.1)
BASOS PCT: 0 % (ref 0–1)
Eosinophils Absolute: 0.4 10*3/uL (ref 0.0–0.7)
Eosinophils Relative: 3 % (ref 0–5)
HEMATOCRIT: 47 % (ref 39.0–52.0)
Hemoglobin: 16.6 g/dL (ref 13.0–17.0)
LYMPHS PCT: 4 % — AB (ref 12–46)
Lymphs Abs: 0.5 10*3/uL — ABNORMAL LOW (ref 0.7–4.0)
MCH: 34.1 pg — ABNORMAL HIGH (ref 26.0–34.0)
MCHC: 35.3 g/dL (ref 30.0–36.0)
MCV: 96.5 fL (ref 78.0–100.0)
MONO ABS: 1.4 10*3/uL — AB (ref 0.1–1.0)
Monocytes Relative: 11 % (ref 3–12)
NEUTROS ABS: 11 10*3/uL — AB (ref 1.7–7.7)
NEUTROS PCT: 82 % — AB (ref 43–77)
PLATELETS: 221 10*3/uL (ref 150–400)
RBC: 4.87 MIL/uL (ref 4.22–5.81)
RDW: 13.7 % (ref 11.5–15.5)
WBC: 13.3 10*3/uL — AB (ref 4.0–10.5)

## 2013-04-28 LAB — BASIC METABOLIC PANEL
BUN: 10 mg/dL (ref 6–23)
CHLORIDE: 94 meq/L — AB (ref 96–112)
CO2: 29 mEq/L (ref 19–32)
CREATININE: 1.13 mg/dL (ref 0.50–1.35)
Calcium: 9.3 mg/dL (ref 8.4–10.5)
GFR calc non Af Amer: 74 mL/min — ABNORMAL LOW (ref >90)
GFR, EST AFRICAN AMERICAN: 85 mL/min — AB (ref >90)
Glucose, Bld: 120 mg/dL — ABNORMAL HIGH (ref 70–99)
Potassium: 3.8 mEq/L (ref 3.7–5.3)
SODIUM: 135 meq/L — AB (ref 137–147)

## 2013-04-28 MED ORDER — HYDROMORPHONE HCL PF 1 MG/ML IJ SOLN
1.0000 mg | Freq: Once | INTRAMUSCULAR | Status: AC
  Administered 2013-04-28: 1 mg via INTRAVENOUS
  Filled 2013-04-28: qty 1

## 2013-04-28 MED ORDER — CEFAZOLIN SODIUM 1-5 GM-% IV SOLN
1.0000 g | Freq: Once | INTRAVENOUS | Status: AC
  Administered 2013-04-28: 1 g via INTRAVENOUS
  Filled 2013-04-28: qty 50

## 2013-04-28 MED ORDER — ONDANSETRON HCL 4 MG/2ML IJ SOLN
4.0000 mg | Freq: Once | INTRAMUSCULAR | Status: AC
  Administered 2013-04-28: 4 mg via INTRAVENOUS
  Filled 2013-04-28: qty 2

## 2013-04-28 MED ORDER — LIDOCAINE HCL (PF) 1 % IJ SOLN
5.0000 mL | Freq: Once | INTRAMUSCULAR | Status: AC
  Administered 2013-04-28: 5 mL
  Filled 2013-04-28: qty 5

## 2013-04-28 NOTE — ED Notes (Addendum)
Pt removed tick from scrotum this morning. Pt scrotum swollen. Pt states having pain w/ urination. Scrotum is red, swollen & noted firmness to the right side.

## 2013-04-28 NOTE — ED Notes (Signed)
Removed tick  From testicles, today, with pain in groin and swelling present.    Dysuria,

## 2013-04-28 NOTE — ED Provider Notes (Signed)
CSN: [id number]     Arrival date & time 04/28/13  2022 History  This chart was scribed for Fernando Fuel, MD by Elby Beck, ED Scribe. This patient was seen in room APA11/APA11 and the patient's care was started at 11:30 PM.   Chief Complaint  Patient presents with  . Groin Swelling  . Tick Removal    The history is provided by the patient. No language interpreter was used.    HPI Comments: Fernando [name] is a 53 y.o. male with a history of HTN who presents to the Emergency Department complaining of constant, gradually worsening scrotal swelling onset last night. Pt reports noticing and removing a tick, with a silver dot on its back, from his testicles this morning. He reports associated "8/10" testicular pain currently, but states that his pain has been "10/10" at its worst. He states that he has also had associated itching to his testicles. He reports associated chills and diaphoresis today. He states that he travels frequently, and that he has recently been in [place] and [place]. He denies noticing any fever at home- his temperature was taken to be 100.2 F in the ED. He states that he is a daily smoker and an occasional drinker. He states that his only medication allergy is to Morphine (which makes him "go crazy").  PCP- Dr. Collene Mares   Past Medical History  Diagnosis Date  . Hypertension 12/30/2011  . Allergy    Past Surgical History  Procedure Laterality Date  . Cervical fusion    . Facial cosmetic surgery    . Knee arthroscopy    . Appendectomy    . Hernia repair    . Spine surgery     History reviewed. No pertinent family history. History  Substance Use Topics  . Smoking status: Current Every Day Smoker -- 0.50 packs/day for 8 years    Types: Cigarettes  . Smokeless tobacco: Not on file  . Alcohol Use: No     Comment: occ    Review of Systems  Constitutional: Positive for chills and diaphoresis. Negative for fever.  Genitourinary: Positive for scrotal swelling  and testicular pain.  All other systems reviewed and are negative.   Allergies  Morphine and related  Home Medications   Prior to Admission medications   Medication Sig Start Date End Date Taking? Authorizing Provider  ALPRAZolam Duanne Moron) 1 MG tablet Take 1 mg by mouth 3 (three) times daily. 04/22/13  Yes Historical Provider, MD  aspirin EC 81 MG tablet Take 81 mg by mouth daily as needed for mild pain or moderate pain.   Yes Historical Provider, MD  lisinopril-hydrochlorothiazide (PRINZIDE,ZESTORETIC) 20-12.5 MG per tablet Take 1 tablet by mouth daily. 04/23/13  Yes Historical Provider, MD  lovastatin (MEVACOR) 20 MG tablet Take 20 mg by mouth at bedtime.   Yes Historical Provider, MD   Triage Vitals: BP 157/89  Pulse 112  Temp(Src) 99.6 F (37.6 C) (Oral)  Resp 20  Ht 6\' 2"  (1.88 m)  Wt 243 lb (110.224 kg)  BMI 31.19 kg/m2  SpO2 97%  Physical Exam  Nursing note and vitals reviewed. Constitutional: He is oriented to person, place, and time. He appears well-developed and well-nourished. No distress.  HENT:  Head: Normocephalic and atraumatic.  Eyes: EOM are normal. Pupils are equal, round, and reactive to light.  Neck: Neck supple. No JVD present. No tracheal deviation present.  Cardiovascular: Normal rate, regular rhythm and normal heart sounds.   No murmur heard. Pulmonary/Chest: Effort normal  and breath sounds normal. No respiratory distress. He has no wheezes. He has no rales.  Abdominal: Soft. Bowel sounds are normal. He exhibits no mass. There is no tenderness.  Genitourinary: Circumcised.  5 cm x 5 cm indurated area on the right side of the scrotum. Diffuse tenderness throughout the scrotum. Entire scrotum is erythematous. Testes descended without masses. Bilateral tender inguinal adenopathy.  Musculoskeletal: Normal range of motion. He exhibits no edema.  Lymphadenopathy:    He has no cervical adenopathy.  Neurological: He is alert and oriented to person, place, and  time. No cranial nerve deficit. Coordination normal.  Skin: Skin is warm and dry.  Psychiatric: He has a normal mood and affect. His behavior is normal.    ED Course  Procedures (including critical care time)  DIAGNOSTIC STUDIES: Oxygen Saturation is 97% on RA, normal by my interpretation.    COORDINATION OF CARE: 11:37 PM- Discussed plan to obtain diagnostic blood work. Will also perform a bedside US to observe for a drain-able abscess. Will also order medicaitons. Pt advised of plan for treatment and pt agrees.  Limited bedside ultrasound done to evaluate for possible abscess. After identifying the patient verbally and with name band, pre-procedural time now was held. Ultrasound showed no evidence of any fluid collection and he is felt to have scrotal cellulitis. Images were saved electronically. Patient tolerated procedure well.  Medications  ceFAZolin (ANCEF) IVPB 1 g/50 mL premix (1 g Intravenous New Bag/Given 04/28/13 2356)  lidocaine (PF) (XYLOCAINE) 1 % injection 5 mL (5 mLs Infiltration Given 04/28/13 2357)  HYDROmorphone (DILAUDID) injection 1 mg (1 mg Intravenous Given 04/28/13 2356)  ondansetron (ZOFRAN) injection 4 mg (4 mg Intravenous Given 04/28/13 2351)   Labs Review Results for orders placed during the hospital encounter of 04/28/13  CBC WITH DIFFERENTIAL      Result Value Ref Range   WBC 13.3 (*) 4.0 - 10.5 K/uL   RBC 4.87  4.22 - 5.81 MIL/uL   Hemoglobin 16.6  13.0 - 17.0 g/dL   HCT 47.0  39.0 - 52.0 %   MCV 96.5  78.0 - 100.0 fL   MCH 34.1 (*) 26.0 - 34.0 pg   MCHC 35.3  30.0 - 36.0 g/dL   RDW 13.7  11.5 - 15.5 %   Platelets 221  150 - 400 K/uL   Neutrophils Relative % 82 (*) 43 - 77 %   Neutro Abs 11.0 (*) 1.7 - 7.7 K/uL   Lymphocytes Relative 4 (*) 12 - 46 %   Lymphs Abs 0.5 (*) 0.7 - 4.0 K/uL   Monocytes Relative 11  3 - 12 %   Monocytes Absolute 1.4 (*) 0.1 - 1.0 K/uL   Eosinophils Relative 3  0 - 5 %   Eosinophils Absolute 0.4  0.0 - 0.7 K/uL    Basophils Relative 0  0 - 1 %   Basophils Absolute 0.0  0.0 - 0.1 K/uL  BASIC METABOLIC PANEL      Result Value Ref Range   Sodium 135 (*) 137 - 147 mEq/L   Potassium 3.8  3.7 - 5.3 mEq/L   Chloride 94 (*) 96 - 112 mEq/L   CO2 29  19 - 32 mEq/L   Glucose, Bld 120 (*) 70 - 99 mg/dL   BUN 10  6 - 23 mg/dL   Creatinine, Ser 1.13  0.50 - 1.35 mg/dL   Calcium 9.3  8.4 - 10.5 mg/dL   GFR calc non Af Amer 74 (*) >90 mL/min  GFR calc Af Amer 85 (*) >90 mL/min  SEDIMENTATION RATE      Result Value Ref Range   Sed Rate 5  0 - 16 mm/hr   Imaging Review Ct Abdomen Pelvis W Contrast  04/29/2013   CLINICAL DATA:  Scrotal infection  EXAM: CT ABDOMEN AND PELVIS WITH CONTRAST  TECHNIQUE: Multidetector CT imaging of the abdomen and pelvis was performed using the standard protocol following bolus administration of intravenous contrast.  CONTRAST:  143mL OMNIPAQUE IOHEXOL 300 MG/ML  SOLN  COMPARISON:  CT ANGIO ABDOMEN W/CM &/OR WO/CM dated 08/17/2009; CT ANGIO PELVIS W/CM &/OR WO/CM dated 08/17/2009  FINDINGS: Diffuse hepatic steatosis.  Gallbladder, spleen, pancreas, adrenal glands are within normal limits. Unremarkable kidneys.  Innumerable borderline enlarged retroperitoneal lymph nodes are present surrounding the aorta. There is associated with stranding in the adjacent fat suggesting an inflammatory process. 15 mm short axis diameter node on image 50. 10 mm node on image 60. 11 mm node on image 66. Small iliac nodes are present. There is abnormal adenopathy in both inguinal regions. 15 mm left inguinal node on image 99. 14 mm inguinal node on the right on image 101.  There is extensive stranding in the right inguinal region. Less prominent stranding in the left inguinal region. There is frank fluid density within the soft tissues of the scrotum. This is likely a combination of skin edema and hydrocele. There is stranding in the right inguinal canal. There is no evidence of gas in the soft tissues. Stranding from  the left inguinal region does extend into the left iliac region.  Bilateral L5 pars defects.  Grade 1 spondylolisthesis at L5-S1.  IMPRESSION: No evidence of gas in the soft tissues of the scrotum.  There is fluid density involving the soft tissues of the scrotum likely related to a combination of soft tissue edema and hydrocele.  There is stranding in both inguinal regions worse on the right. This does extend into the left iliac region. This is consistent with an inflammatory process.  There is abnormal adenopathy in the inguinal regions, iliac regions, and para-aortic chain. This is most likely related to the inflammatory process. Underlying malignancy is not excluded. Correlate clinically as to the need for followup imaging to ensure resolution.   Electronically Signed   By: Maryclare Bean M.D.   On: 04/29/2013 01:30   Images viewed by me.  MDM   Final diagnoses:  Cellulitis of scrotum    Scrotal cellulitis with possible abscess. He'll be evaluated with the ultrasound to see if there is any fluid collection that would be amenable to incision and drainage. Consideration will be given for CT scan to make sure there is no deep infection such as an early Fournier's gangrene. Treatment was started with intravenous cefazolin and is given hydromorphone for pain.  He got good relief of pain with the above-noted treatment. CT confirms soft tissue inflammation extending up into the inguinal regions but no evidence of Fournier is gangrene. Case is discussed with Dr. Maryland Pink of [facility] who agrees to admit the patient.   I personally performed the services described in this documentation, which was scribed in my presence. The recorded information has been reviewed and is accurate.     Fernando Fuel, MD 67/67/20 [id number]

## 2013-04-29 ENCOUNTER — Emergency Department (HOSPITAL_COMMUNITY): Payer: 59

## 2013-04-29 ENCOUNTER — Encounter (HOSPITAL_COMMUNITY): Payer: Self-pay | Admitting: Radiology

## 2013-04-29 DIAGNOSIS — T148 Other injury of unspecified body region: Secondary | ICD-10-CM

## 2013-04-29 DIAGNOSIS — N492 Inflammatory disorders of scrotum: Secondary | ICD-10-CM | POA: Diagnosis present

## 2013-04-29 DIAGNOSIS — N498 Inflammatory disorders of other specified male genital organs: Principal | ICD-10-CM

## 2013-04-29 DIAGNOSIS — W57XXXA Bitten or stung by nonvenomous insect and other nonvenomous arthropods, initial encounter: Secondary | ICD-10-CM | POA: Diagnosis present

## 2013-04-29 DIAGNOSIS — I10 Essential (primary) hypertension: Secondary | ICD-10-CM

## 2013-04-29 LAB — CBC
HEMATOCRIT: 42.9 % (ref 39.0–52.0)
HEMATOCRIT: 41.1 % (ref 39.0–52.0)
Hemoglobin: 15 g/dL (ref 13.0–17.0)
Hemoglobin: 14.1 g/dL (ref 13.0–17.0)
MCH: 33.6 pg (ref 26.0–34.0)
MCH: 33.2 pg (ref 26.0–34.0)
MCHC: 35 g/dL (ref 30.0–36.0)
MCHC: 34.3 g/dL (ref 30.0–36.0)
MCV: 96 fL (ref 78.0–100.0)
MCV: 96.7 fL (ref 78.0–100.0)
Platelets: 194 10*3/uL (ref 150–400)
Platelets: 188 10*3/uL (ref 150–400)
RBC: 4.47 MIL/uL (ref 4.22–5.81)
RBC: 4.25 MIL/uL (ref 4.22–5.81)
RDW: 13.5 % (ref 11.5–15.5)
RDW: 13.6 % (ref 11.5–15.5)
WBC: 15.2 10*3/uL — ABNORMAL HIGH (ref 4.0–10.5)
WBC: 13 10*3/uL — AB (ref 4.0–10.5)

## 2013-04-29 LAB — COMPREHENSIVE METABOLIC PANEL
ALBUMIN: 3.3 g/dL — AB (ref 3.5–5.2)
ALK PHOS: 94 U/L (ref 39–117)
ALT: 23 U/L (ref 0–53)
AST: 22 U/L (ref 0–37)
BUN: 10 mg/dL (ref 6–23)
CO2: 28 mEq/L (ref 19–32)
Calcium: 8.6 mg/dL (ref 8.4–10.5)
Chloride: 95 mEq/L — ABNORMAL LOW (ref 96–112)
Creatinine, Ser: 1.15 mg/dL (ref 0.50–1.35)
GFR calc Af Amer: 83 mL/min — ABNORMAL LOW (ref >90)
GFR calc non Af Amer: 72 mL/min — ABNORMAL LOW (ref >90)
Glucose, Bld: 130 mg/dL — ABNORMAL HIGH (ref 70–99)
POTASSIUM: 3.4 meq/L — AB (ref 3.7–5.3)
SODIUM: 135 meq/L — AB (ref 137–147)
Total Bilirubin: 0.9 mg/dL (ref 0.3–1.2)
Total Protein: 7 g/dL (ref 6.0–8.3)

## 2013-04-29 LAB — HIV ANTIBODY (ROUTINE TESTING W REFLEX): HIV 1&2 Ab, 4th Generation: NONREACTIVE

## 2013-04-29 LAB — SEDIMENTATION RATE: SED RATE: 5 mm/h (ref 0–16)

## 2013-04-29 MED ORDER — DOXYCYCLINE HYCLATE 100 MG IV SOLR
INTRAVENOUS | Status: AC
  Filled 2013-04-29: qty 100

## 2013-04-29 MED ORDER — ONDANSETRON HCL 4 MG PO TABS: 4.0000 mg | ORAL_TABLET | Freq: Four times a day (QID) | ORAL | Status: DC | PRN

## 2013-04-29 MED ORDER — VANCOMYCIN HCL 10 G IV SOLR
1250.0000 mg | Freq: Once | INTRAVENOUS | Status: AC
  Administered 2013-04-29: 1250 mg via INTRAVENOUS
  Filled 2013-04-29: qty 1250

## 2013-04-29 MED ORDER — SIMVASTATIN 10 MG PO TABS
10.0000 mg | ORAL_TABLET | Freq: Every day | ORAL | Status: DC
  Administered 2013-04-29 – 2013-04-30 (×2): 10 mg via ORAL
  Filled 2013-04-29 (×2): qty 1

## 2013-04-29 MED ORDER — HYDROMORPHONE HCL PF 1 MG/ML IJ SOLN
0.5000 mg | INTRAMUSCULAR | Status: DC | PRN
  Administered 2013-04-29 – 2013-05-01 (×8): 0.5 mg via INTRAVENOUS
  Filled 2013-04-29 (×8): qty 1

## 2013-04-29 MED ORDER — ACETAMINOPHEN 325 MG PO TABS: 650.0000 mg | ORAL_TABLET | Freq: Four times a day (QID) | ORAL | Status: DC | PRN

## 2013-04-29 MED ORDER — VANCOMYCIN HCL 10 G IV SOLR
1250.0000 mg | Freq: Two times a day (BID) | INTRAVENOUS | Status: DC
  Administered 2013-04-29 – 2013-05-01 (×4): 1250 mg via INTRAVENOUS
  Filled 2013-04-29 (×6): qty 1250

## 2013-04-29 MED ORDER — SODIUM CHLORIDE 0.9 % IV SOLN
INTRAVENOUS | Status: AC
  Administered 2013-04-29 (×2): via INTRAVENOUS

## 2013-04-29 MED ORDER — PIPERACILLIN-TAZOBACTAM 3.375 G IVPB
3.3750 g | Freq: Once | INTRAVENOUS | Status: AC
  Administered 2013-04-29: 3.375 g via INTRAVENOUS
  Filled 2013-04-29: qty 50

## 2013-04-29 MED ORDER — ALPRAZOLAM 0.5 MG PO TABS
0.5000 mg | ORAL_TABLET | Freq: Three times a day (TID) | ORAL | Status: DC | PRN
  Administered 2013-04-29 – 2013-04-30 (×2): 0.5 mg via ORAL
  Filled 2013-04-29 (×2): qty 1

## 2013-04-29 MED ORDER — ONDANSETRON HCL 4 MG/2ML IJ SOLN
4.0000 mg | Freq: Four times a day (QID) | INTRAMUSCULAR | Status: DC | PRN
  Administered 2013-04-29 (×2): 4 mg via INTRAVENOUS
  Filled 2013-04-29 (×2): qty 2

## 2013-04-29 MED ORDER — PIPERACILLIN-TAZOBACTAM 3.375 G IVPB
INTRAVENOUS | Status: AC
  Filled 2013-04-29: qty 50

## 2013-04-29 MED ORDER — ACETAMINOPHEN 650 MG RE SUPP: 650.0000 mg | Freq: Four times a day (QID) | RECTAL | Status: DC | PRN

## 2013-04-29 MED ORDER — DOXYCYCLINE HYCLATE 100 MG IV SOLR
100.0000 mg | Freq: Two times a day (BID) | INTRAVENOUS | Status: DC
  Administered 2013-04-29 – 2013-05-01 (×5): 100 mg via INTRAVENOUS
  Filled 2013-04-29 (×8): qty 100

## 2013-04-29 MED ORDER — IOHEXOL 300 MG/ML  SOLN
100.0000 mL | Freq: Once | INTRAMUSCULAR | Status: AC | PRN
  Administered 2013-04-29: 100 mL via INTRAVENOUS

## 2013-04-29 MED ORDER — OXYCODONE HCL 5 MG PO TABS
5.0000 mg | ORAL_TABLET | ORAL | Status: DC | PRN
  Administered 2013-04-29 – 2013-05-01 (×8): 5 mg via ORAL
  Filled 2013-04-29 (×8): qty 1

## 2013-04-29 MED ORDER — ALBUTEROL SULFATE (2.5 MG/3ML) 0.083% IN NEBU: 2.5000 mg | INHALATION_SOLUTION | RESPIRATORY_TRACT | Status: DC | PRN

## 2013-04-29 MED ORDER — DOCUSATE SODIUM 100 MG PO CAPS
100.0000 mg | ORAL_CAPSULE | Freq: Two times a day (BID) | ORAL | Status: DC
  Administered 2013-04-29 (×2): 100 mg via ORAL
  Filled 2013-04-29 (×4): qty 1

## 2013-04-29 MED ORDER — PIPERACILLIN-TAZOBACTAM 3.375 G IVPB
3.3750 g | Freq: Three times a day (TID) | INTRAVENOUS | Status: DC
  Administered 2013-04-29 – 2013-05-01 (×6): 3.375 g via INTRAVENOUS
  Filled 2013-04-29 (×10): qty 50

## 2013-04-29 NOTE — Progress Notes (Signed)
Utilization Review Complete  

## 2013-04-29 NOTE — Progress Notes (Signed)
Patient admitted by Dr. Maryland Pink earlier this morning.  Patient seen and examined.  He's been admitted for scrotal cellulitis after probable tick bite. He is on broad-spectrum antibiotics with vancomycin, Zosyn, doxycycline. He has significant erythema, tenderness and induration noted in right scrotal/inguinal area. No clear abscess was palpated. He's being followed by urology. We'll continue current treatments for now.  [employer]

## 2013-04-29 NOTE — H&P (Signed)
[facility] History and Physical  Fernando [name] DVV:[id number] DOB: [birth year] DOA: 04/28/2013   PCP: Collene Mares, PA-C  Specialists: None  Chief Complaint: Pain in the scrotum  HPI: Fernando [name] is a 53 y.o. male with a past medical history of hypertension, hyperlipidemia, who is a truck Geophysicist/field seismologist. He was in [place], [place] from Wednesday to Saturday. Earlier yesterday he noticed some swelling in his scrotum. He took a shower and started itching in that area, which is unusual for him. He felt a bump in his left scrotum and when he tried to manipulate the bump it came off and he realized that it was a tick. His swelling has gotten worse. His pain is increased. He has found it difficult to walk. The pain is 8/10 in intensity, worse with ambulation. He's noticed some yellowish drainage. He noticed blood in the urine. He had fever up to 102F, along with chills. He's had some lower abdominal pain, as well with nausea. Denies any vomiting. He's felt weak and dizzy. Denies any syncopal episodes. He's never had similar problems in the past. Denies unprotected sexual intercourse recently.  Home Medications: Prior to Admission medications   Medication Sig Start Date End Date Taking? Authorizing Provider  ALPRAZolam Duanne Moron) 1 MG tablet Take 1 mg by mouth 3 (three) times daily. 04/22/13  Yes Historical Provider, MD  aspirin EC 81 MG tablet Take 81 mg by mouth daily as needed for mild pain or moderate pain.   Yes Historical Provider, MD  lisinopril-hydrochlorothiazide (PRINZIDE,ZESTORETIC) 20-12.5 MG per tablet Take 1 tablet by mouth daily. 04/23/13  Yes Historical Provider, MD  lovastatin (MEVACOR) 20 MG tablet Take 20 mg by mouth at bedtime.   Yes Historical Provider, MD    Allergies:  Allergies  Allergen Reactions  . Morphine And Related Other (See Comments)    hallucinations    Past Medical History: Past Medical History  Diagnosis Date  . Hypertension 12/30/2011  . Allergy     Past  Surgical History  Procedure Laterality Date  . Cervical fusion    . Facial cosmetic surgery    . Knee arthroscopy    . Appendectomy    . Hernia repair    . Spine surgery      Social History: He lives in [place]. He is a Administrator as discussed earlier. Smokes a half a pack of cigarettes on a daily basis. Occasional alcohol use. Denies any illicit drug use. Independent with daily activities.  Family History:  Family History  Problem Relation Age of Onset  . Breast cancer Mother      Review of Systems - History obtained from the patient General ROS: positive for  - chills, fatigue and fever Psychological ROS: negative Ophthalmic ROS: negative ENT ROS: negative Allergy and Immunology ROS: negative Hematological and Lymphatic ROS: negative Endocrine ROS: negative Respiratory ROS: no cough, shortness of breath, or wheezing Cardiovascular ROS: no chest pain or dyspnea on exertion Gastrointestinal ROS: as in hpi Genito-Urinary ROS: as in hpi Musculoskeletal ROS: negative Neurological ROS: no TIA or stroke symptoms Dermatological ROS: negative  Physical Examination  Filed Vitals:   04/28/13 2050 04/28/13 2220 04/28/13 2300 04/29/13 0142  BP: 157/89  146/89 104/58  Pulse: 112  105 100  Temp: 100.2 F (37.9 C) 99.6 F (37.6 C) 99.4 F (37.4 C) 97.3 F (36.3 C)  TempSrc: Oral Oral Oral Oral  Resp: $Remo'20  18 16  'uiway$ Height: $Rem'6\' 2"'jEOu$  (1.88 m)     Weight: 110.224 kg (243  lb)     SpO2: 97%  93% 93%    BP 104/58  Pulse 100  Temp(Src) 97.3 F (36.3 C) (Oral)  Resp 16  Ht $R'6\' 2"'RN$  (1.88 m)  Wt 110.224 kg (243 lb)  BMI 31.19 kg/m2  SpO2 93%  General appearance: alert, cooperative, appears stated age and no distress Head: Normocephalic, without obvious abnormality, atraumatic Eyes: conjunctivae/corneas clear. PERRL, EOM's intact.  Throat: lips, mucosa, and tongue normal; teeth and gums normal Neck: no adenopathy, no carotid bruit, no JVD, supple, symmetrical, trachea midline and  thyroid not enlarged, symmetric, no tenderness/mass/nodules Resp: clear to auscultation bilaterally Cardio: regular rate and rhythm, S1, S2 normal, no murmur, click, rub or gallop GI: Abdomen is soft. There is some tenderness in the lower abdomen without any rebound, rigidity, or guarding.  no masses, or organomegaly. bowel sounds are present. Male genitalia: Scrotal swelling is noted. There is mild erythema along with warmth to touch. The entire area is tender to palpation. Extremities: extremities normal, atraumatic, no cyanosis or edema Pulses: 2+ and symmetric Skin: Erythema over the scrotum bilaterally Lymph nodes: Bilateral inguinal lymphadenopathy is present which is tender to palpation Neurologic: He is alert and oriented x3. No focal neurological deficits are present.  Laboratory Data: Results for orders placed during the hospital encounter of 04/28/13 (from the past 48 hour(s))  CBC WITH DIFFERENTIAL     Status: Abnormal   Collection Time    04/28/13 10:14 PM      Result Value Ref Range   WBC 13.3 (*) 4.0 - 10.5 K/uL   RBC 4.87  4.22 - 5.81 MIL/uL   Hemoglobin 16.6  13.0 - 17.0 g/dL   HCT 47.0  39.0 - 52.0 %   MCV 96.5  78.0 - 100.0 fL   MCH 34.1 (*) 26.0 - 34.0 pg   MCHC 35.3  30.0 - 36.0 g/dL   RDW 13.7  11.5 - 15.5 %   Platelets 221  150 - 400 K/uL   Neutrophils Relative % 82 (*) 43 - 77 %   Neutro Abs 11.0 (*) 1.7 - 7.7 K/uL   Lymphocytes Relative 4 (*) 12 - 46 %   Lymphs Abs 0.5 (*) 0.7 - 4.0 K/uL   Monocytes Relative 11  3 - 12 %   Monocytes Absolute 1.4 (*) 0.1 - 1.0 K/uL   Eosinophils Relative 3  0 - 5 %   Eosinophils Absolute 0.4  0.0 - 0.7 K/uL   Basophils Relative 0  0 - 1 %   Basophils Absolute 0.0  0.0 - 0.1 K/uL  BASIC METABOLIC PANEL     Status: Abnormal   Collection Time    04/28/13 10:14 PM      Result Value Ref Range   Sodium 135 (*) 137 - 147 mEq/L   Potassium 3.8  3.7 - 5.3 mEq/L   Chloride 94 (*) 96 - 112 mEq/L   CO2 29  19 - 32 mEq/L    Glucose, Bld 120 (*) 70 - 99 mg/dL   BUN 10  6 - 23 mg/dL   Creatinine, Ser 1.13  0.50 - 1.35 mg/dL   Calcium 9.3  8.4 - 10.5 mg/dL   GFR calc non Af Amer 74 (*) >90 mL/min   GFR calc Af Amer 85 (*) >90 mL/min   Comment: (NOTE)     The eGFR has been calculated using the CKD EPI equation.     This calculation has not been validated in all clinical situations.  eGFR's persistently <90 mL/min signify possible Chronic Kidney     Disease.  SEDIMENTATION RATE     Status: None   Collection Time    04/28/13 10:14 PM      Result Value Ref Range   Sed Rate 5  0 - 16 mm/hr    Radiology Reports: Ct Abdomen Pelvis W Contrast  04/29/2013   CLINICAL DATA:  Scrotal infection  EXAM: CT ABDOMEN AND PELVIS WITH CONTRAST  TECHNIQUE: Multidetector CT imaging of the abdomen and pelvis was performed using the standard protocol following bolus administration of intravenous contrast.  CONTRAST:  136mL OMNIPAQUE IOHEXOL 300 MG/ML  SOLN  COMPARISON:  CT ANGIO ABDOMEN W/CM &/OR WO/CM dated 08/17/2009; CT ANGIO PELVIS W/CM &/OR WO/CM dated 08/17/2009  FINDINGS: Diffuse hepatic steatosis.  Gallbladder, spleen, pancreas, adrenal glands are within normal limits. Unremarkable kidneys.  Innumerable borderline enlarged retroperitoneal lymph nodes are present surrounding the aorta. There is associated with stranding in the adjacent fat suggesting an inflammatory process. 15 mm short axis diameter node on image 50. 10 mm node on image 60. 11 mm node on image 66. Small iliac nodes are present. There is abnormal adenopathy in both inguinal regions. 15 mm left inguinal node on image 99. 14 mm inguinal node on the right on image 101.  There is extensive stranding in the right inguinal region. Less prominent stranding in the left inguinal region. There is frank fluid density within the soft tissues of the scrotum. This is likely a combination of skin edema and hydrocele. There is stranding in the right inguinal canal. There is no evidence  of gas in the soft tissues. Stranding from the left inguinal region does extend into the left iliac region.  Bilateral L5 pars defects.  Grade 1 spondylolisthesis at L5-S1.  IMPRESSION: No evidence of gas in the soft tissues of the scrotum.  There is fluid density involving the soft tissues of the scrotum likely related to a combination of soft tissue edema and hydrocele.  There is stranding in both inguinal regions worse on the right. This does extend into the left iliac region. This is consistent with an inflammatory process.  There is abnormal adenopathy in the inguinal regions, iliac regions, and para-aortic chain. This is most likely related to the inflammatory process. Underlying malignancy is not excluded. Correlate clinically as to the need for followup imaging to ensure resolution.   Electronically Signed   By: Maryclare Bean M.D.   On: 04/29/2013 01:30    Problem List  Principal Problem:   Cellulitis of scrotum Active Problems:   Hypertension   Tick bite   Assessment: This is a 53 year old, Caucasian male, who presents with pain in the scrotal area. He appears to have scrotal cellulitis. Some fluid density was noted involving the soft tissues. No obvious abscess was noted. He appears to have scrotal cellulitis. No evidence for Fournier's gangrene. He also had a tick bite in that area. These could be related.  Plan: #1 scrotal cellulitis with tick bite: No obvious abscess noted on CT scan. No intra-abdominal extension. He'll be treated with vancomycin and Zosyn for now. Will also add doxycycline due to the history of tick bite. Consult urology to see him. Scrotal support. Pain control. Blood Cultures will be obtained.  #2 mild dehydration: He'll be given IV fluids.  #3 history of hypertension: Her blood pressure closely. Hold his antihypertensive agents for now.   DVT Prophylaxis: SCD's Code Status: Full code Family Communication: Discussed with the patient  Disposition Plan: Admit to  MedSurg   Further management decisions will depend on results of further testing and patient's response to treatment.   Bonnielee Haff  [facility] Pager [phone]  If 7PM-7AM, please contact night-coverage [email or web address] Password [email or web address]  04/29/2013, 2:13 AM

## 2013-04-29 NOTE — Consult Note (Signed)
i have reviewed the chart will see the patient con tinue present antibiotcs he will also need w/u for gross hemeturia.

## 2013-04-29 NOTE — Progress Notes (Signed)
His temp was not low grade but 102Fyesterday wbc went up.

## 2013-04-29 NOTE — Progress Notes (Signed)
ANTIBIOTIC CONSULT NOTE-Preliminary  Pharmacy Consult for Vancomycin and Zosyn Indication: Cellulitis  Allergies  Allergen Reactions  . Morphine And Related Other (See Comments)    hallucinations    Patient Measurements: Height: 6\' 2"  (188 cm) Weight: 220 lb 1.6 oz (99.837 kg) IBW/kg (Calculated) : 82.2   Vital Signs: Temp: 99 F (37.2 C) (04/21 0240) Temp src: Oral (04/21 0240) BP: 122/69 mmHg (04/21 0240) Pulse Rate: 101 (04/21 0240)  Labs:  Recent Labs  04/28/13 2214  WBC 13.3*  HGB 16.6  PLT 221  CREATININE 1.13    Estimated Creatinine Clearance: 97.6 ml/min (by C-G formula based on Cr of 1.13).  No results found for this basename: VANCOTROUGH, VANCOPEAK, VANCORANDOM, GENTTROUGH, GENTPEAK, GENTRANDOM, TOBRATROUGH, TOBRAPEAK, TOBRARND, AMIKACINPEAK, AMIKACINTROU, AMIKACIN,  in the last 72 hours   Microbiology: No results found for this or any previous visit (from the past 720 hour(s)).  Medical History: Past Medical History  Diagnosis Date  . Hypertension 12/30/2011  . Allergy     Medications:  Cefazolin 1 Gm IV in the ED Doxycycline 100 mg IV every 12 hours  Assessment: 53 yo male admitted with lower abdominal pain, weakness, chills, elevated temperature, elevated WBCs and scrotal swelling s/p tick bite. He will be given Vancomycin for scrotal cellulitis and Zosyn for broad spectrum antibiotic coverage.  Goal of Therapy:  Eradication of infection  Plan:  Preliminary review of pertinent patient information completed.  Protocol will be initiated with a one-time doses of Zosyn 3.375 Gm IV and Vancomycin 1250 mg IV.  Forestine Na clinical pharmacist will complete review during morning rounds to assess patient and finalize treatment regimen.  Norberto Sorenson, [facility] 04/29/2013,3:08 AM .

## 2013-04-29 NOTE — Care Management Note (Addendum)
    Page 1 of 1   05/01/2013     12:51:27 PM CARE MANAGEMENT NOTE 05/01/2013  Patient:  Fernando [name], Fernando [name]   Account Number:  [id number]  Date Initiated:  04/29/2013  Documentation initiated by:  Fernando [name]  Subjective/Objective Assessment:   Pt admitted from home where he lives with his son. No HH or DME needs identified at this time.     Action/Plan:   Anticipated DC Date:  05/01/2013   Anticipated DC Plan:  [place]  CM consult      [facility] Choice  HOME HEALTH   Choice offered to / List presented to:  C-1 Patient        [place] arranged  HH-1 RN      [place].   Status of service:  Completed, signed off Medicare Important Message given?   (If response is "NO", the following Medicare IM given date fields will be blank) Date Medicare IM given:   Date Additional Medicare IM given:    Discharge Disposition:  [place]  Per UR Regulation:    If discussed at Long Length of Stay Meetings, dates discussed:    Comments:  05/01/13 Fernando Cooper RN BSN CM [facility] RN to do wound care tomorrow am at pt's home  04/29/13 Fernando Cooper RN BNS CM

## 2013-04-29 NOTE — Progress Notes (Signed)
ANTIBIOTIC CONSULT NOTE - FOLLOW UP  Pharmacy Consult for Vancomycin and Zosyn Indication: cellulitis  Allergies  Allergen Reactions  . Morphine And Related Other (See Comments)    hallucinations   Patient Measurements: Height: 6\' 2"  (188 cm) Weight: 220 lb 1.6 oz (99.837 kg) IBW/kg (Calculated) : 82.2  Vital Signs: Temp: 99 F (37.2 C) (04/21 0240) Temp src: Oral (04/21 0240) BP: 122/69 mmHg (04/21 0240) Pulse Rate: 101 (04/21 0240) Intake/Output from previous day:   Intake/Output from this shift: Total I/O In: 240 [P.O.:240] Out: -   Labs:  Recent Labs  04/28/13 2214 04/29/13 0336 04/29/13 0500  WBC 13.3*  --  15.2*  HGB 16.6  --  15.0  PLT 221  --  194  CREATININE 1.13 1.15  --    Estimated Creatinine Clearance: 95.9 ml/min (by C-G formula based on Cr of 1.15). No results found for this basename: VANCOTROUGH, Corlis Leak, VANCORANDOM, GENTTROUGH, GENTPEAK, GENTRANDOM, TOBRATROUGH, TOBRAPEAK, TOBRARND, AMIKACINPEAK, AMIKACINTROU, AMIKACIN,  in the last 72 hours   Microbiology: Recent Results (from the past 720 hour(s))  CULTURE, BLOOD (ROUTINE X 2)     Status: None   Collection Time    04/29/13  2:42 AM      Result Value Ref Range Status   Specimen Description BLOOD RIGHT ANTECUBITAL   Final   Special Requests BOTTLES DRAWN AEROBIC AND ANAEROBIC 4CC EACH   Final   Culture NO GROWTH <24 HRS   Final   Report Status PENDING   Incomplete  CULTURE, BLOOD (ROUTINE X 2)     Status: None   Collection Time    04/29/13  2:47 AM      Result Value Ref Range Status   Specimen Description BLOOD RIGHT ARM   Final   Special Requests BOTTLES DRAWN AEROBIC AND ANAEROBIC 4CC EACH   Final   Culture NO GROWTH <24 HRS   Final   Report Status PENDING   Incomplete    Anti-infectives   Start     Dose/Rate Route Frequency Ordered Stop   04/29/13 1800  vancomycin (VANCOCIN) 1,250 mg in sodium chloride 0.9 % 250 mL IVPB     1,250 mg 166.7 mL/hr over 90 Minutes Intravenous Every  12 hours 04/29/13 0738     04/29/13 1200  piperacillin-tazobactam (ZOSYN) IVPB 3.375 g     3.375 g 12.5 mL/hr over 240 Minutes Intravenous Every 8 hours 04/29/13 0737     04/29/13 0315  piperacillin-tazobactam (ZOSYN) IVPB 3.375 g     3.375 g 12.5 mL/hr over 240 Minutes Intravenous  Once 04/29/13 0307 04/29/13 0830   04/29/13 0315  vancomycin (VANCOCIN) 1,250 mg in sodium chloride 0.9 % 250 mL IVPB     1,250 mg 166.7 mL/hr over 90 Minutes Intravenous  Once 04/29/13 0308 04/29/13 0600   04/29/13 0245  doxycycline (VIBRAMYCIN) 100 mg in dextrose 5 % 250 mL IVPB     100 mg 125 mL/hr over 120 Minutes Intravenous Every 12 hours 04/29/13 0241     04/28/13 2345  ceFAZolin (ANCEF) IVPB 1 g/50 mL premix     1 g 100 mL/hr over 30 Minutes Intravenous  Once 04/28/13 2337 04/29/13 0027     Assessment: 53yo male admitted with lower abdominal pain, chills, elevated WBC, fever, and scrotal swelling after reported tick bite.  Pt has been started on Vancomycin, Zosyn, and Doxycycline.  Estimated Creatinine Clearance: 95.9 ml/min (by C-G formula based on Cr of 1.15).  Ancef 1gm IV on 4/20 x 1 dose  Doxycycline 4/21 >> Vancomycin 4/21 >> Zosyn 4/21 >>  Goal of Therapy:  Vancomycin trough level 10-15 mcg/ml Eradicate infection.  Plan:   Continue Doxycycline >> switch to PO when improved  Zosyn 3.375gm IV q8h, each dose over 4 hrs  Vancomycin 1250mg  IV q12hrs  Check trough at steady state or as indicated  Monitor labs, renal fxn, and cultures as indicated  De-escalate antibiotics when improved / appropriate  Duration of therapy per MD  Nicki Reaper A Avry Monteleone 04/29/2013,10:37 AM

## 2013-04-29 NOTE — Consult Note (Signed)
Reason for Consult:pain r scrotum tick bite to scrotum low grade fever gross haemeturia. Referring Physician: hospitalist  Fernando [name] is an 53 y.o. male.  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 No voiding difficulty.now still c/o pain and swelling r scrotum.ct abdomen and pelvis with wo contrast shows retropertoneal lymph adenopathy most likely inflamatory.  Past Medical History  Diagnosis Date  . Hypertension 12/30/2011  . Allergy     Past Surgical History  Procedure Laterality Date  . Cervical fusion    . Facial cosmetic surgery    . Knee arthroscopy    . Appendectomy    . Hernia repair    . Spine surgery      Family History  Problem Relation Age of Onset  . Breast cancer Mother     Social History:  reports that he has been smoking Cigarettes.  He has a 4 pack-year smoking history. He does not have any smokeless tobacco history on file. He reports that he does not drink alcohol or use illicit drugs.  Allergies:  Allergies  Allergen Reactions  . Morphine And Related Other (See Comments)    hallucinations    Medications:   Results for orders placed during the hospital encounter of 04/28/13 (from the past 48 hour(s))  CBC WITH DIFFERENTIAL     Status: Abnormal   Collection Time    04/28/13 10:14 PM      Result Value Ref Range   WBC 13.3 (*) 4.0 - 10.5 K/uL   RBC 4.87  4.22 - 5.81 MIL/uL   Hemoglobin 16.6  13.0 - 17.0 g/dL   HCT 05.4  32.0 - 45.2 %   MCV 96.5  78.0 - 100.0 fL   MCH 34.1 (*) 26.0 - 34.0 pg   MCHC 35.3  30.0 - 36.0 g/dL   RDW 17.8  02.9 - 11.5 %   Platelets 221  150 - 400 K/uL   Neutrophils Relative % 82 (*) 43 - 77 %   Neutro Abs 11.0 (*) 1.7 - 7.7 K/uL   Lymphocytes Relative 4 (*) 12 - 46 %   Lymphs Abs 0.5 (*) 0.7 - 4.0 K/uL   Monocytes Relative 11  3 - 12 %   Monocytes Absolute 1.4 (*) 0.1 - 1.0 K/uL   Eosinophils Relative 3  0 - 5 %    Eosinophils Absolute 0.4  0.0 - 0.7 K/uL   Basophils Relative 0  0 - 1 %   Basophils Absolute 0.0  0.0 - 0.1 K/uL  BASIC METABOLIC PANEL     Status: Abnormal   Collection Time    04/28/13 10:14 PM      Result Value Ref Range   Sodium 135 (*) 137 - 147 mEq/L   Potassium 3.8  3.7 - 5.3 mEq/L   Chloride 94 (*) 96 - 112 mEq/L   CO2 29  19 - 32 mEq/L   Glucose, Bld 120 (*) 70 - 99 mg/dL   BUN 10  6 - 23 mg/dL   Creatinine, Ser 5.29  0.50 - 1.35 mg/dL   Calcium 9.3  8.4 - 19.7 mg/dL   GFR calc non Af Amer 74 (*) >90 mL/min   GFR calc Af Amer 85 (*) >90 mL/min   Comment: (NOTE)     The eGFR has been calculated using the CKD EPI equation.     This calculation has  not been validated in all clinical situations.     eGFR's persistently <90 mL/min signify possible Chronic Kidney     Disease.  SEDIMENTATION RATE     Status: None   Collection Time    04/28/13 10:14 PM      Result Value Ref Range   Sed Rate 5  0 - 16 mm/hr  CULTURE, BLOOD (ROUTINE X 2)     Status: None   Collection Time    04/29/13  2:42 AM      Result Value Ref Range   Specimen Description BLOOD RIGHT ANTECUBITAL     Special Requests BOTTLES DRAWN AEROBIC AND ANAEROBIC 4CC EACH     Culture NO GROWTH <24 HRS     Report Status PENDING    CULTURE, BLOOD (ROUTINE X 2)     Status: None   Collection Time    04/29/13  2:47 AM      Result Value Ref Range   Specimen Description BLOOD RIGHT ARM     Special Requests BOTTLES DRAWN AEROBIC AND ANAEROBIC 4CC EACH     Culture NO GROWTH <24 HRS     Report Status PENDING    COMPREHENSIVE METABOLIC PANEL     Status: Abnormal   Collection Time    04/29/13  3:36 AM      Result Value Ref Range   Sodium 135 (*) 137 - 147 mEq/L   Potassium 3.4 (*) 3.7 - 5.3 mEq/L   Chloride 95 (*) 96 - 112 mEq/L   CO2 28  19 - 32 mEq/L   Glucose, Bld 130 (*) 70 - 99 mg/dL   BUN 10  6 - 23 mg/dL   Creatinine, Ser 1.15  0.50 - 1.35 mg/dL   Calcium 8.6  8.4 - 10.5 mg/dL   Total Protein 7.0  6.0 - 8.3  g/dL   Albumin 3.3 (*) 3.5 - 5.2 g/dL   AST 22  0 - 37 U/L   ALT 23  0 - 53 U/L   Alkaline Phosphatase 94  39 - 117 U/L   Total Bilirubin 0.9  0.3 - 1.2 mg/dL   GFR calc non Af Amer 72 (*) >90 mL/min   GFR calc Af Amer 83 (*) >90 mL/min   Comment: (NOTE)     The eGFR has been calculated using the CKD EPI equation.     This calculation has not been validated in all clinical situations.     eGFR's persistently <90 mL/min signify possible Chronic Kidney     Disease.  CBC     Status: Abnormal   Collection Time    04/29/13  5:00 AM      Result Value Ref Range   WBC 15.2 (*) 4.0 - 10.5 K/uL   RBC 4.47  4.22 - 5.81 MIL/uL   Hemoglobin 15.0  13.0 - 17.0 g/dL   HCT 42.9  39.0 - 52.0 %   MCV 96.0  78.0 - 100.0 fL   MCH 33.6  26.0 - 34.0 pg   MCHC 35.0  30.0 - 36.0 g/dL   RDW 13.5  11.5 - 15.5 %   Platelets 194  150 - 400 K/uL    Ct Abdomen Pelvis W Contrast  04/29/2013   CLINICAL DATA:  Scrotal infection  EXAM: CT ABDOMEN AND PELVIS WITH CONTRAST  TECHNIQUE: Multidetector CT imaging of the abdomen and pelvis was performed using the standard protocol following bolus administration of intravenous contrast.  CONTRAST:  179mL OMNIPAQUE IOHEXOL 300 MG/ML  SOLN  COMPARISON:  CT ANGIO ABDOMEN W/CM &/OR WO/CM dated 08/17/2009; CT ANGIO PELVIS W/CM &/OR WO/CM dated 08/17/2009  FINDINGS: Diffuse hepatic steatosis.  Gallbladder, spleen, pancreas, adrenal glands are within normal limits. Unremarkable kidneys.  Innumerable borderline enlarged retroperitoneal lymph nodes are present surrounding the aorta. There is associated with stranding in the adjacent fat suggesting an inflammatory process. 15 mm short axis diameter node on image 50. 10 mm node on image 60. 11 mm node on image 66. Small iliac nodes are present. There is abnormal adenopathy in both inguinal regions. 15 mm left inguinal node on image 99. 14 mm inguinal node on the right on image 101.  There is extensive stranding in the right inguinal region.  Less prominent stranding in the left inguinal region. There is frank fluid density within the soft tissues of the scrotum. This is likely a combination of skin edema and hydrocele. There is stranding in the right inguinal canal. There is no evidence of gas in the soft tissues. Stranding from the left inguinal region does extend into the left iliac region.  Bilateral L5 pars defects.  Grade 1 spondylolisthesis at L5-S1.  IMPRESSION: No evidence of gas in the soft tissues of the scrotum.  There is fluid density involving the soft tissues of the scrotum likely related to a combination of soft tissue edema and hydrocele.  There is stranding in both inguinal regions worse on the right. This does extend into the left iliac region. This is consistent with an inflammatory process.  There is abnormal adenopathy in the inguinal regions, iliac regions, and para-aortic chain. This is most likely related to the inflammatory process. Underlying malignancy is not excluded. Correlate clinically as to the need for followup imaging to ensure resolution.   Electronically Signed   By: Maryclare Bean M.D.   On: 04/29/2013 01:30    SLP:NPYYF pressure 122/69, pulse 101, temperature 99 F (37.2 C), temperature source Oral, resp. rate 18, height $RemoveBe'6\' 2"'gGqYwquDo$  (1.88 m), weight 99.837 kg (220 lb 1.6 oz), SpO2 96.00%. Physical Exam:tenderness and induration r inguinal region also odema and tenderness r scrotum ant wall size of a quarter both tests feel normal rectal deferred will do at the time of cysto .  Assessment/Plan:will follow continue present antibiotics will send urine cytology and cultures  and schedule local cysto on Thursday. Cysto under local on Thursday.  Marissa Nestle 04/29/2013, 10:20 AM

## 2013-04-30 DIAGNOSIS — R319 Hematuria, unspecified: Secondary | ICD-10-CM

## 2013-04-30 LAB — URINE CULTURE
CULTURE: NO GROWTH
Colony Count: NO GROWTH

## 2013-04-30 LAB — CBC WITH DIFFERENTIAL/PLATELET
BASOS PCT: 0 % (ref 0–1)
Basophils Absolute: 0 10*3/uL (ref 0.0–0.1)
EOS ABS: 0.8 10*3/uL — AB (ref 0.0–0.7)
EOS PCT: 9 % — AB (ref 0–5)
HCT: 39.9 % (ref 39.0–52.0)
Hemoglobin: 13.7 g/dL (ref 13.0–17.0)
LYMPHS ABS: 1.2 10*3/uL (ref 0.7–4.0)
Lymphocytes Relative: 13 % (ref 12–46)
MCH: 33.4 pg (ref 26.0–34.0)
MCHC: 34.3 g/dL (ref 30.0–36.0)
MCV: 97.3 fL (ref 78.0–100.0)
MONO ABS: 0.8 10*3/uL (ref 0.1–1.0)
MONOS PCT: 9 % (ref 3–12)
Neutro Abs: 6.1 10*3/uL (ref 1.7–7.7)
Neutrophils Relative %: 69 % (ref 43–77)
Platelets: 178 10*3/uL (ref 150–400)
RBC: 4.1 MIL/uL — AB (ref 4.22–5.81)
RDW: 13.4 % (ref 11.5–15.5)
WBC: 8.8 10*3/uL (ref 4.0–10.5)

## 2013-04-30 MED ORDER — LISINOPRIL 10 MG PO TABS
20.0000 mg | ORAL_TABLET | Freq: Every day | ORAL | Status: DC
  Administered 2013-04-30: 20 mg via ORAL
  Filled 2013-04-30: qty 2

## 2013-04-30 MED ORDER — NICOTINE 21 MG/24HR TD PT24
21.0000 mg | MEDICATED_PATCH | Freq: Every day | TRANSDERMAL | Status: DC
  Administered 2013-04-30: 21 mg via TRANSDERMAL
  Filled 2013-04-30: qty 1

## 2013-04-30 NOTE — Progress Notes (Signed)
[facility] PROGRESS NOTE  Fernando [name] WER:[id number] DOB: [birth year] DOA: 04/28/2013 PCP: Fernando Mares, PA-C  Assessment/Plan: 1. Scrotal cellulitis. Appears to improve with current antibiotic regimen. Continue treatments for today, likely transition to oral antibiotics tomorrow. 2. Hypertension. Continue outpatient regimen. 3. Tobacco abuse. Patient is on nicotine patch. Counseled on importance of tobacco cessation. 4. Hematuria. Per urology notes, patient reported hematuria prior to admission. Imaging of the abdomen did not indicate any renal masses. He is to undergo cystoscopy tomorrow.  Code Status: full code Family Communication: no family present Disposition Plan: discharge home, possibly tomorrow   Consultants:  Urology  Procedures:    Antibiotics:  Doxycycline 4/21  Zosyn 4/21  Vancomycin 4/21  HPI/Subjective: Feeling significantly improved today.  Objective: Filed Vitals:   04/30/13 1335  BP: 146/86  Pulse: 79  Temp: 97.8 F (36.6 C)  Resp: 18    Intake/Output Summary (Last 24 hours) at 04/30/13 1824 Last data filed at 04/30/13 1747  Gross per 24 hour  Intake    480 ml  Output   2975 ml  Net  -2495 ml   Filed Weights   04/28/13 2050 04/29/13 0240  Weight: 110.224 kg (243 lb) 99.837 kg (220 lb 1.6 oz)    Exam:   General:  NAD  Cardiovascular: S1, S2 RRR  Respiratory: CTA B  Abdomen: soft, nt, nd, bs+  Musculoskeletal: no edema b/l  Skin: Skin over the right groin, scrotum appears to be less erythematous, tender. Overall appears to be improving   Data Reviewed: Basic Metabolic Panel:  Recent Labs Lab 04/28/13 2214 04/29/13 0336  NA 135* 135*  K 3.8 3.4*  CL 94* 95*  CO2 29 28  GLUCOSE 120* 130*  BUN 10 10  CREATININE 1.13 1.15  CALCIUM 9.3 8.6   Liver Function Tests:  Recent Labs Lab 04/29/13 0336  AST 22  ALT 23  ALKPHOS 94  BILITOT 0.9  PROT 7.0  ALBUMIN 3.3*   No results found for this basename:  LIPASE, AMYLASE,  in the last 168 hours No results found for this basename: AMMONIA,  in the last 168 hours CBC:  Recent Labs Lab 04/28/13 2214 04/29/13 0500 04/29/13 1121 04/30/13 1249  WBC 13.3* 15.2* 13.0* 8.8  NEUTROABS 11.0*  --   --  6.1  HGB 16.6 15.0 14.1 13.7  HCT 47.0 42.9 41.1 39.9  MCV 96.5 96.0 96.7 97.3  PLT 221 194 188 178   Cardiac Enzymes: No results found for this basename: CKTOTAL, CKMB, CKMBINDEX, TROPONINI,  in the last 168 hours BNP (last 3 results) No results found for this basename: PROBNP,  in the last 8760 hours CBG: No results found for this basename: GLUCAP,  in the last 168 hours  Recent Results (from the past 240 hour(s))  CULTURE, BLOOD (ROUTINE X 2)     Status: None   Collection Time    04/29/13  2:42 AM      Result Value Ref Range Status   Specimen Description BLOOD RIGHT ANTECUBITAL   Final   Special Requests BOTTLES DRAWN AEROBIC AND ANAEROBIC 4CC EACH   Final   Culture NO GROWTH 1 DAY   Final   Report Status PENDING   Incomplete  CULTURE, BLOOD (ROUTINE X 2)     Status: None   Collection Time    04/29/13  2:47 AM      Result Value Ref Range Status   Specimen Description BLOOD RIGHT ARM   Final   Special Requests  BOTTLES DRAWN AEROBIC AND ANAEROBIC 4CC EACH   Final   Culture NO GROWTH 1 DAY   Final   Report Status PENDING   Incomplete     Studies: Ct Abdomen Pelvis W Contrast  04/29/2013   CLINICAL DATA:  Scrotal infection  EXAM: CT ABDOMEN AND PELVIS WITH CONTRAST  TECHNIQUE: Multidetector CT imaging of the abdomen and pelvis was performed using the standard protocol following bolus administration of intravenous contrast.  CONTRAST:  115mL OMNIPAQUE IOHEXOL 300 MG/ML  SOLN  COMPARISON:  CT ANGIO ABDOMEN W/CM &/OR WO/CM dated 08/17/2009; CT ANGIO PELVIS W/CM &/OR WO/CM dated 08/17/2009  FINDINGS: Diffuse hepatic steatosis.  Gallbladder, spleen, pancreas, adrenal glands are within normal limits. Unremarkable kidneys.  Innumerable borderline  enlarged retroperitoneal lymph nodes are present surrounding the aorta. There is associated with stranding in the adjacent fat suggesting an inflammatory process. 15 mm short axis diameter node on image 50. 10 mm node on image 60. 11 mm node on image 66. Small iliac nodes are present. There is abnormal adenopathy in both inguinal regions. 15 mm left inguinal node on image 99. 14 mm inguinal node on the right on image 101.  There is extensive stranding in the right inguinal region. Less prominent stranding in the left inguinal region. There is frank fluid density within the soft tissues of the scrotum. This is likely a combination of skin edema and hydrocele. There is stranding in the right inguinal canal. There is no evidence of gas in the soft tissues. Stranding from the left inguinal region does extend into the left iliac region.  Bilateral L5 pars defects.  Grade 1 spondylolisthesis at L5-S1.  IMPRESSION: No evidence of gas in the soft tissues of the scrotum.  There is fluid density involving the soft tissues of the scrotum likely related to a combination of soft tissue edema and hydrocele.  There is stranding in both inguinal regions worse on the right. This does extend into the left iliac region. This is consistent with an inflammatory process.  There is abnormal adenopathy in the inguinal regions, iliac regions, and para-aortic chain. This is most likely related to the inflammatory process. Underlying malignancy is not excluded. Correlate clinically as to the need for followup imaging to ensure resolution.   Electronically Signed   By: Fernando [name] M.D.   On: 04/29/2013 01:30    Scheduled Meds: . docusate sodium  100 mg Oral BID  . doxycycline (VIBRAMYCIN) IV  100 mg Intravenous Q12H  . nicotine  21 mg Transdermal Daily  . piperacillin-tazobactam (ZOSYN)  IV  3.375 g Intravenous Q8H  . simvastatin  10 mg Oral q1800  . vancomycin  1,250 mg Intravenous Q12H   Continuous Infusions:   Principal  Problem:   Cellulitis of scrotum Active Problems:   Hypertension   Tick bite    Time spent: 43mins    Fernando [name]  [facility] Pager [phone]. If 7PM-7AM, please contact night-coverage at [email or web address], password [email or web address] 04/30/2013, 6:24 PM  LOS: 2 days

## 2013-04-30 NOTE — Progress Notes (Signed)
Received call from Dr Silvano Rusk office.  Pt cysto has been moved up to 0930 on 4/23 due to cancellation.

## 2013-04-30 NOTE — Progress Notes (Signed)
Call received from Dr Silvano Rusk office concerning a CBC that was supposed to be ordered for this pt.  No orders were in the system for this.  Told to call Dr Michela Pitcher with the results

## 2013-05-01 ENCOUNTER — Encounter (HOSPITAL_COMMUNITY)
Admission: EM | Disposition: A | Discharge status: Home / Self Care | Payer: Self-pay | Source: Home / Self Care | Attending: Internal Medicine

## 2013-05-01 ENCOUNTER — Inpatient Hospital Stay (HOSPITAL_COMMUNITY): Payer: 59 | Admitting: Anesthesiology

## 2013-05-01 ENCOUNTER — Encounter (HOSPITAL_COMMUNITY): Payer: Self-pay | Admitting: *Deleted

## 2013-05-01 ENCOUNTER — Encounter (HOSPITAL_COMMUNITY): Payer: 59 | Admitting: Anesthesiology

## 2013-05-01 DIAGNOSIS — N492 Inflammatory disorders of scrotum: Secondary | ICD-10-CM | POA: Diagnosis present

## 2013-05-01 DIAGNOSIS — L0390 Cellulitis, unspecified: Secondary | ICD-10-CM | POA: Diagnosis present

## 2013-05-01 HISTORY — PX: INCISION AND DRAINAGE ABSCESS: SHX5864

## 2013-05-01 HISTORY — PX: CYSTOSCOPY: SHX5120

## 2013-05-01 LAB — BASIC METABOLIC PANEL
BUN: 7 mg/dL (ref 6–23)
CALCIUM: 8.7 mg/dL (ref 8.4–10.5)
CO2: 33 meq/L — AB (ref 19–32)
CREATININE: 1.06 mg/dL (ref 0.50–1.35)
Chloride: 100 mEq/L (ref 96–112)
GFR calc Af Amer: >90 mL/min (ref >90)
GFR, EST NON AFRICAN AMERICAN: 80 mL/min — AB (ref >90)
Glucose, Bld: 109 mg/dL — ABNORMAL HIGH (ref 70–99)
Potassium: 3.8 mEq/L (ref 3.7–5.3)
Sodium: 139 mEq/L (ref 137–147)

## 2013-05-01 LAB — CBC
HCT: 40 % (ref 39.0–52.0)
Hemoglobin: 13.5 g/dL (ref 13.0–17.0)
MCH: 32.8 pg (ref 26.0–34.0)
MCHC: 33.8 g/dL (ref 30.0–36.0)
MCV: 97.3 fL (ref 78.0–100.0)
PLATELETS: 179 10*3/uL (ref 150–400)
RBC: 4.11 MIL/uL — AB (ref 4.22–5.81)
RDW: 13.4 % (ref 11.5–15.5)
WBC: 6.6 10*3/uL (ref 4.0–10.5)

## 2013-05-01 SURGERY — CANCELLED PROCEDURE

## 2013-05-01 SURGERY — CYSTOSCOPY
Anesthesia: General | Site: Scrotum | Laterality: Right

## 2013-05-01 MED ORDER — ONDANSETRON HCL 4 MG/2ML IJ SOLN
INTRAMUSCULAR | Status: AC
  Filled 2013-05-01: qty 2

## 2013-05-01 MED ORDER — SODIUM CHLORIDE 0.9 % IR SOLN
Status: DC | PRN
  Administered 2013-05-01: 3000 mL

## 2013-05-01 MED ORDER — PROPOFOL 10 MG/ML IV BOLUS
INTRAVENOUS | Status: DC | PRN
  Administered 2013-05-01: 200 mg via INTRAVENOUS

## 2013-05-01 MED ORDER — MIDAZOLAM HCL 2 MG/2ML IJ SOLN
1.0000 mg | INTRAMUSCULAR | Status: AC | PRN
  Administered 2013-05-01 (×3): 2 mg via INTRAVENOUS

## 2013-05-01 MED ORDER — DOXYCYCLINE HYCLATE 50 MG PO CAPS: 50.0000 mg | ORAL_CAPSULE | Freq: Two times a day (BID) | ORAL | Status: DC

## 2013-05-01 MED ORDER — PROPOFOL 10 MG/ML IV EMUL
INTRAVENOUS | Status: AC
  Filled 2013-05-01: qty 20

## 2013-05-01 MED ORDER — ONDANSETRON HCL 4 MG/2ML IJ SOLN: 4.0000 mg | Freq: Once | INTRAMUSCULAR | Status: DC | PRN

## 2013-05-01 MED ORDER — LIDOCAINE HCL (PF) 1 % IJ SOLN
INTRAMUSCULAR | Status: AC
  Filled 2013-05-01: qty 5

## 2013-05-01 MED ORDER — FENTANYL CITRATE 0.05 MG/ML IJ SOLN
INTRAMUSCULAR | Status: AC
  Filled 2013-05-01: qty 2

## 2013-05-01 MED ORDER — MIDAZOLAM HCL 5 MG/5ML IJ SOLN
INTRAMUSCULAR | Status: DC | PRN
  Administered 2013-05-01: 2 mg via INTRAVENOUS

## 2013-05-01 MED ORDER — FENTANYL CITRATE 0.05 MG/ML IJ SOLN
INTRAMUSCULAR | Status: DC | PRN
Start: 2013-05-01 — End: 2013-05-01
  Administered 2013-05-01: 50 ug via INTRAVENOUS

## 2013-05-01 MED ORDER — GLYCOPYRROLATE 0.2 MG/ML IJ SOLN
0.2000 mg | Freq: Once | INTRAMUSCULAR | Status: AC
  Administered 2013-05-01: 0.2 mg via INTRAVENOUS

## 2013-05-01 MED ORDER — FENTANYL CITRATE 0.05 MG/ML IJ SOLN: 25.0000 ug | INTRAMUSCULAR | Status: DC | PRN

## 2013-05-01 MED ORDER — HYDROCODONE-ACETAMINOPHEN 5-325 MG PO TABS: 1.0000 | ORAL_TABLET | Freq: Four times a day (QID) | ORAL | Status: DC | PRN

## 2013-05-01 MED ORDER — FENTANYL CITRATE 0.05 MG/ML IJ SOLN
25.0000 ug | INTRAMUSCULAR | Status: AC
  Administered 2013-05-01 (×2): 25 ug via INTRAVENOUS

## 2013-05-01 MED ORDER — ONDANSETRON HCL 4 MG/2ML IJ SOLN
4.0000 mg | Freq: Once | INTRAMUSCULAR | Status: AC
  Administered 2013-05-01: 4 mg via INTRAVENOUS

## 2013-05-01 MED ORDER — MIDAZOLAM HCL 2 MG/2ML IJ SOLN
INTRAMUSCULAR | Status: AC
  Filled 2013-05-01: qty 2

## 2013-05-01 MED ORDER — MIDAZOLAM HCL 2 MG/2ML IJ SOLN
INTRAMUSCULAR | Status: AC
  Filled 2013-05-01: qty 4

## 2013-05-01 MED ORDER — LIDOCAINE HCL 1 % IJ SOLN
INTRAMUSCULAR | Status: DC | PRN
  Administered 2013-05-01: 50 mg via INTRADERMAL

## 2013-05-01 MED ORDER — LACTATED RINGERS IV SOLN
INTRAVENOUS | Status: DC
  Administered 2013-05-01 (×2): via INTRAVENOUS

## 2013-05-01 MED ORDER — STERILE WATER FOR IRRIGATION IR SOLN
Status: DC | PRN
  Administered 2013-05-01: 500 mL

## 2013-05-01 MED ORDER — GLYCOPYRROLATE 0.2 MG/ML IJ SOLN
INTRAMUSCULAR | Status: AC
  Filled 2013-05-01: qty 1

## 2013-05-01 SURGICAL SUPPLY — 30 items
BAG DRAIN URO TABLE W/ADPT NS (DRAPE) ×4 IMPLANT
BAG DRN 8 ADPR NS SKTRN CSTL (DRAPE) ×2
BAG HAMPER (MISCELLANEOUS) ×4 IMPLANT
BLADE 15 SAFETY STRL DISP (BLADE) ×2 IMPLANT
CATH FOLEY 2WAY SLVR  5CC 18FR (CATHETERS)
CATH FOLEY 2WAY SLVR 5CC 18FR (CATHETERS) IMPLANT
CLOTH BEACON ORANGE TIMEOUT ST (SAFETY) ×4 IMPLANT
COVER MAYO STAND XLG (DRAPE) ×2 IMPLANT
COVER SURGICAL LIGHT HANDLE (MISCELLANEOUS) ×4 IMPLANT
GAUZE PACKING IODOFORM 1/2 (PACKING) ×2 IMPLANT
GAUZE SPONGE 4X4 16PLY XRAY LF (GAUZE/BANDAGES/DRESSINGS) ×2 IMPLANT
GLOVE BIO SURGEON STRL SZ7 (GLOVE) ×4 IMPLANT
GOWN STRL REUS W/TWL LRG LVL3 (GOWN DISPOSABLE) ×8 IMPLANT
IV NS IRRIG 3000ML ARTHROMATIC (IV SOLUTION) ×4 IMPLANT
KIT BLADEGUARD II DBL (SET/KITS/TRAYS/PACK) ×2 IMPLANT
KIT ROOM TURNOVER AP CYSTO (KITS) ×4 IMPLANT
MANIFOLD NEPTUNE II (INSTRUMENTS) ×4 IMPLANT
PACK CYSTO (CUSTOM PROCEDURE TRAY) ×4 IMPLANT
PAD ARMBOARD 7.5X6 YLW CONV (MISCELLANEOUS) ×4 IMPLANT
PENCIL HANDSWITCHING (ELECTRODE) ×2 IMPLANT
SET IRRIGATING DISP (SET/KITS/TRAYS/PACK) ×4 IMPLANT
SPONGE GAUZE 4X4 12PLY (GAUZE/BANDAGES/DRESSINGS) ×2 IMPLANT
SUPPORT SCROTAL LRG (SOFTGOODS) ×1
SUPPORT SCROTAL LRG NO STRP (SOFTGOODS) ×1 IMPLANT
SWAB CULTURE LIQ STUART DBL (MISCELLANEOUS) ×2 IMPLANT
SYRINGE IRR TOOMEY STRL 70CC (SYRINGE) IMPLANT
TOWEL OR 17X26 4PK STRL BLUE (TOWEL DISPOSABLE) ×4 IMPLANT
TUBE ANAEROBIC PORT A CUL  W/M (MISCELLANEOUS) ×2 IMPLANT
WATER STERILE IRR 1000ML POUR (IV SOLUTION) ×4 IMPLANT
YANKAUER SUCT BULB TIP 10FT TU (MISCELLANEOUS) ×2 IMPLANT

## 2013-05-01 SURGICAL SUPPLY — 14 items
CLOTH BEACON ORANGE TIMEOUT ST (SAFETY) ×3 IMPLANT
DRAPE LAPAROTOMY TRNSV 102X78 (DRAPE) ×3 IMPLANT
DRAPE PROXIMA HALF (DRAPES) ×3 IMPLANT
GLOVE BIO SURGEON STRL SZ7 (GLOVE) ×3 IMPLANT
GOWN STRL REUS W/TWL LRG LVL3 (GOWN DISPOSABLE) ×6 IMPLANT
IV NS IRRIG 3000ML ARTHROMATIC (IV SOLUTION) ×3 IMPLANT
MARKER SKIN DUAL TIP RULER LAB (MISCELLANEOUS) ×3 IMPLANT
SET IRRIG Y TYPE TUR BLADDER L (SET/KITS/TRAYS/PACK) ×3 IMPLANT
SPONGE GAUZE 4X4 12PLY (GAUZE/BANDAGES/DRESSINGS) ×3 IMPLANT
TOWEL OR 17X26 4PK STRL BLUE (TOWEL DISPOSABLE) ×3 IMPLANT
TRAY FOLEY CATH 16FR SILVER (SET/KITS/TRAYS/PACK) ×3 IMPLANT
VALVE DISPOSABLE (MISCELLANEOUS) ×3 IMPLANT
WATER STERILE IRR 1000ML POUR (IV SOLUTION) ×3 IMPLANT
YANKAUER SUCT BULB TIP 10FT TU (MISCELLANEOUS) ×3 IMPLANT

## 2013-05-01 NOTE — Progress Notes (Signed)
He requeted to have anesthesia for cystoscy we will do it under mac also he wants to get rid of indurated area in r scrotum it is flctuant now i have told him that this does not need to be removed iiwill do incision and drainage it wii take several days to resolve he probably goes home with it on antibiotics.

## 2013-05-01 NOTE — Progress Notes (Signed)
No change in general condition on reexamination.

## 2013-05-01 NOTE — Brief Op Note (Signed)
04/28/2013 - 05/01/2013  10:30 AM  PATIENT:  Fernando [name]  53 y.o. male  PRE-OPERATIVE DIAGNOSIS:  Hematuria  POST-OPERATIVE DIAGNOSIS:  normal cystoscopy, scrotal abscess  PROCEDURE:  Procedure(s): CYSTOSCOPY (N/A) INCISION AND DRAINAGE RIGHT SCROTUM (Right)  SURGEON:  Surgeon(s) and Role:    * Marissa Nestle, MD - Primary  PHYSICIAN ASSISTANT:   ASSISTANTS: none   ANESTHESIA:   general  EBL:  Total I/O In: 1000 [I.V.:1000] Out: -   BLOOD ADMINISTERED:none  DRAINS: culture from scrotal abscess   LOCAL MEDICATIONS USED:  NONE  SPECIMEN:  No Specimen  DISPOSITION OF SPECIMEN:  N/A  COUNTS:  YES  TOURNIQUET:  * No tourniquets in log *  DICTATION: .Other Dictation: Dictation Number dictation [id number]  PLAN OF CARE: Admit for overnight observation  PATIENT DISPOSITION:  PACU - hemodynamically stable.   Delay start of Pharmacological VTE agent (>24hrs) due to surgical blood loss or risk of bleeding:

## 2013-05-01 NOTE — Transfer of Care (Signed)
Immediate Anesthesia Transfer of Care Note  Patient: Fernando [name]  Procedure(s) Performed: Procedure(s): CYSTOSCOPY (N/A) INCISION AND DRAINAGE RIGHT SCROTUM (Right)  Patient Location: PACU  Anesthesia Type:General  Level of Consciousness: awake, alert , oriented and patient cooperative  Airway & Oxygen Therapy: Patient Spontanous Breathing and Patient connected to face mask oxygen  Post-op Assessment: Report given to PACU RN, Post -op Vital signs reviewed and stable and Patient moving all extremities  Post vital signs: Reviewed and stable  Complications: No apparent anesthesia complications

## 2013-05-01 NOTE — Anesthesia Preprocedure Evaluation (Signed)
Anesthesia Evaluation  Patient identified by MRN, date of birth, ID band Patient awake    Reviewed: Allergy & Precautions, H&P , NPO status , Patient's Chart, lab work & pertinent test results  Airway Mallampati: II TM Distance: >3 FB Neck ROM: Full    Dental  (+) Teeth Intact,    Pulmonary Current Smoker,  breath sounds clear to auscultation        Cardiovascular hypertension, Pt. on medications Rhythm:Regular Rate:Normal     Neuro/Psych    GI/Hepatic negative GI ROS,   Endo/Other    Renal/GU      Musculoskeletal   Abdominal   Peds  Hematology   Anesthesia Other Findings   Reproductive/Obstetrics                           Anesthesia Physical Anesthesia Plan  ASA: II  Anesthesia Plan: General   Post-op Pain Management:    Induction: Intravenous  Airway Management Planned: LMA  Additional Equipment:   Intra-op Plan:   Post-operative Plan: Extubation in OR  Informed Consent: I have reviewed the patients History and Physical, chart, labs and discussed the procedure including the risks, benefits and alternatives for the proposed anesthesia with the patient or authorized representative who has indicated his/her understanding and acceptance.     Plan Discussed with:   Anesthesia Plan Comments: (Pt wants general anesthesia, not local or MAC.)        Anesthesia Quick Evaluation

## 2013-05-01 NOTE — Anesthesia Postprocedure Evaluation (Signed)
  Anesthesia Post-op Note  Patient: Fernando [name]  Procedure(s) Performed: Procedure(s): CYSTOSCOPY (N/A) INCISION AND DRAINAGE RIGHT SCROTUM (Right)  Patient Location: PACU  Anesthesia Type:General  Level of Consciousness: awake, alert , oriented and patient cooperative  Airway and Oxygen Therapy: Patient Spontanous Breathing  Post-op Pain: 2 /10, mild  Post-op Assessment: Post-op Vital signs reviewed, Patient's Cardiovascular Status Stable, Respiratory Function Stable, Patent Airway and Pain level controlled  Post-op Vital Signs: Reviewed and stable  Last Vitals:  Filed Vitals:   05/01/13 0943  BP: 112/64  Pulse: 87  Temp:   Resp: 20    Complications: No apparent anesthesia complications

## 2013-05-01 NOTE — Discharge Instructions (Signed)
Abscess An abscess is an infected area that contains a collection of pus and debris.It can occur in almost any part of the body. An abscess is also known as a furuncle or boil. CAUSES  An abscess occurs when tissue gets infected. This can occur from blockage of oil or sweat glands, infection of hair follicles, or a minor injury to the skin. As the body tries to fight the infection, pus collects in the area and creates pressure under the skin. This pressure causes pain. People with weakened immune systems have difficulty fighting infections and get certain abscesses more often.  SYMPTOMS Usually an abscess develops on the skin and becomes a painful mass that is red, warm, and tender. If the abscess forms under the skin, you may feel a moveable soft area under the skin. Some abscesses break open (rupture) on their own, but most will continue to get worse without care. The infection can spread deeper into the body and eventually into the bloodstream, causing you to feel ill.  DIAGNOSIS  Your caregiver will take your medical history and perform a physical exam. A sample of fluid may also be taken from the abscess to determine what is causing your infection. TREATMENT  Your caregiver may prescribe antibiotic medicines to fight the infection. However, taking antibiotics alone usually does not cure an abscess. Your caregiver may need to make a small cut (incision) in the abscess to drain the pus. In some cases, gauze is packed into the abscess to reduce pain and to continue draining the area. HOME CARE INSTRUCTIONS   Only take over-the-counter or prescription medicines for pain, discomfort, or fever as directed by your caregiver.  If you were prescribed antibiotics, take them as directed. Finish them even if you start to feel better.  If gauze is used, follow your caregiver's directions for changing the gauze.  To avoid spreading the infection:  Keep your draining abscess covered with a  bandage.  Wash your hands well.  Do not share personal care items, towels, or whirlpools with others.  Avoid skin contact with others.  Keep your skin and clothes clean around the abscess.  Keep all follow-up appointments as directed by your caregiver. SEEK MEDICAL CARE IF:   You have increased pain, swelling, redness, fluid drainage, or bleeding.  You have muscle aches, chills, or a general ill feeling.  You have a fever. MAKE SURE YOU:   Understand these instructions.  Will watch your condition.  Will get help right away if you are not doing well or get worse. Document Released: 10/05/2004 Document Revised: 06/27/2011 Document Reviewed: 03/10/2011 [employer] Patient Information 2014 [employer].  

## 2013-05-01 NOTE — Anesthesia Procedure Notes (Signed)
Date/Time: 05/01/2013 9:57 AM Performed by: Charmaine Downs Pre-anesthesia Checklist: Patient being monitored, Suction available, Emergency Drugs available and Patient identified Patient Re-evaluated:Patient Re-evaluated prior to inductionOxygen Delivery Method: Circle system utilized Preoxygenation: Pre-oxygenation with 100% oxygen Intubation Type: IV induction Ventilation: Mask ventilation without difficulty LMA: LMA inserted LMA Size: 4.0 Tube type: Oral Number of attempts: 1 Placement Confirmation: positive ETCO2 and breath sounds checked- equal and bilateral Tube secured with: Tape Dental Injury: Teeth and Oropharynx as per pre-operative assessment

## 2013-05-01 NOTE — Discharge Summary (Signed)
Physician Discharge Summary  Fernando [name] JKK:[id number] DOB: August 30, 1960 DOA: 04/28/2013  PCP: Fernando Mares, PA-C  Admit date: 04/28/2013 Discharge date: 05/01/2013  Time spent: 35 minutes  Recommendations for Outpatient Follow-up:  Follow up with urology in 1 week Home health has been arranged to help with dressing changes Follow up with primary care doctor in 1-2 weeks  Discharge Diagnoses:  Principal Problem:   Cellulitis of scrotum Active Problems:   Hypertension   Tick bite   Hematuria   Scrotal wall abscess   Cellulitis   Discharge Condition: improved  Diet recommendation: low salt  Filed Weights   04/28/13 2050 04/29/13 0240  Weight: 110.224 kg (243 lb) 99.837 kg (220 lb 1.6 oz)    History of present illness:  Fernando [name] is a 53 y.o. male with a past medical history of hypertension, hyperlipidemia, who is a Administrator. He was in [place], [place] from Wednesday to Saturday. Earlier yesterday he noticed some swelling in his scrotum. He took a shower and started itching in that area, which is unusual for him. He felt a bump in his left scrotum and when he tried to manipulate the bump it came off and he realized that it was a tick. His swelling has gotten worse. His pain is increased. He has found it difficult to walk. The pain is 8/10 in intensity, worse with ambulation. He's noticed some yellowish drainage. He noticed blood in the urine. He had fever up to 102F, along with chills. He's had some lower abdominal pain, as well with nausea. Denies any vomiting. He's felt weak and dizzy. Denies any syncopal episodes. He's never had similar problems in the past. Denies unprotected sexual intercourse recently.   Hospital Course:  This patient was admitted to the hospital with a scrotal/groin cellulitis following a possible tick bite.  Patient was started on broad spectrum antibiotics.  He was seen by urology who performed incision and drainage of developing scrotal abscess.   Patient tolerated this well.  With antibiotics he has significantly improved.  Erythema has near resolved. He is not having fevers, leukocytosis has resolved and tenderness has improved.  He also reported hematuria and underwent cytoscopy at the time of abscess [name]. This was found to be unremarkable.  He has been cleared for discharge by urology.  He will be placed on a course of doxycycline and follow up with pcp/urology.  Home health has been arranged for dressing changes.  Procedures:  Cystoscopy   Incision and drainage of scrotal abscess  Consultations:  urology  Discharge Exam: Filed Vitals:   05/01/13 1145  BP: 137/95  Pulse: 79  Temp: 97.5 F (36.4 C)  Resp: 18    General: NAD Cardiovascular: S1, S2 RRR Respiratory: cta b  Discharge Instructions You were cared for by a hospitalist during your hospital stay. If you have any questions about your discharge medications or the care you received while you were in the hospital after you are discharged, you can call the unit and asked to speak with the hospitalist on call if the hospitalist that took care of you is not available. Once you are discharged, your primary care physician will handle any further medical issues. Please note that NO REFILLS for any discharge medications will be authorized once you are discharged, as it is imperative that you return to your primary care physician (or establish a relationship with a primary care physician if you do not have one) for your aftercare needs so that they  can reassess your need for medications and monitor your lab values.  Discharge Orders   Future Orders Complete By Expires   Call MD for:  redness, tenderness, or signs of infection (pain, swelling, redness, odor or green/yellow discharge around incision site)  As directed    Call MD for:  severe uncontrolled pain  As directed    Call MD for:  temperature >100.4  As directed    Diet - low sodium heart healthy  As directed     Increase activity slowly  As directed        Medication List         ALPRAZolam 1 MG tablet  Commonly known as:  XANAX  Take 1 mg by mouth 3 (three) times daily.     aspirin EC 81 MG tablet  Take 81 mg by mouth daily as needed for mild pain or moderate pain.     doxycycline 50 MG capsule  Commonly known as:  VIBRAMYCIN  Take 1 capsule (50 mg total) by mouth 2 (two) times daily.     HYDROcodone-acetaminophen 5-325 MG per tablet  Commonly known as:  NORCO  Take 1 tablet by mouth every 6 (six) hours as needed for moderate pain.     lisinopril-hydrochlorothiazide 20-12.5 MG per tablet  Commonly known as:  PRINZIDE,ZESTORETIC  Take 1 tablet by mouth daily.     lovastatin 20 MG tablet  Commonly known as:  MEVACOR  Take 20 mg by mouth at bedtime.       Allergies  Allergen Reactions  . Morphine And Related Other (See Comments)    hallucinations       Follow-up Information   Follow up with Fernando [name], BENJAMIN, PA-C. Schedule an appointment as soon as possible for a visit in 2 weeks.   Specialty:  Physician Assistant   Contact information:   [address] [place] [address]       Follow up with Fernando Dinning I, MD. Schedule an appointment as soon as possible for a visit in 1 week.   Specialty:  Urology   Contact information:   [address] [place] [address] [phone]       Follow up with [facility]. (RN will call to arrange visit for wound care)    Contact information:   [address] [place] [address] [phone]       The results of significant diagnostics from this hospitalization (including imaging, microbiology, ancillary and laboratory) are listed below for reference.    Significant Diagnostic Studies: Ct Abdomen Pelvis W Contrast  04/29/2013   CLINICAL DATA:  Scrotal infection  EXAM: CT ABDOMEN AND PELVIS WITH CONTRAST  TECHNIQUE: Multidetector CT imaging of the abdomen and pelvis was performed using the standard  protocol following bolus administration of intravenous contrast.  CONTRAST:  140mL OMNIPAQUE IOHEXOL 300 MG/ML  SOLN  COMPARISON:  CT ANGIO ABDOMEN W/CM &/OR WO/CM dated 08/17/2009; CT ANGIO PELVIS W/CM &/OR WO/CM dated 08/17/2009  FINDINGS: Diffuse hepatic steatosis.  Gallbladder, spleen, pancreas, adrenal glands are within normal limits. Unremarkable kidneys.  Innumerable borderline enlarged retroperitoneal lymph nodes are present surrounding the aorta. There is associated with stranding in the adjacent fat suggesting an inflammatory process. 15 mm short axis diameter node on image 50. 10 mm node on image 60. 11 mm node on image 66. Small iliac nodes are present. There is abnormal adenopathy in both inguinal regions. 15 mm left inguinal node on image 99. 14 mm inguinal node on the right  on image 101.  There is extensive stranding in the right inguinal region. Less prominent stranding in the left inguinal region. There is frank fluid density within the soft tissues of the scrotum. This is likely a combination of skin edema and hydrocele. There is stranding in the right inguinal canal. There is no evidence of gas in the soft tissues. Stranding from the left inguinal region does extend into the left iliac region.  Bilateral L5 pars defects.  Grade 1 spondylolisthesis at L5-S1.  IMPRESSION: No evidence of gas in the soft tissues of the scrotum.  There is fluid density involving the soft tissues of the scrotum likely related to a combination of soft tissue edema and hydrocele.  There is stranding in both inguinal regions worse on the right. This does extend into the left iliac region. This is consistent with an inflammatory process.  There is abnormal adenopathy in the inguinal regions, iliac regions, and para-aortic chain. This is most likely related to the inflammatory process. Underlying malignancy is not excluded. Correlate clinically as to the need for followup imaging to ensure resolution.   Electronically Signed    By: Maryclare Bean M.D.   On: 04/29/2013 01:30    Microbiology: Recent Results (from the past 240 hour(s))  CULTURE, BLOOD (ROUTINE X 2)     Status: None   Collection Time    04/29/13  2:42 AM      Result Value Ref Range Status   Specimen Description BLOOD RIGHT ANTECUBITAL   Final   Special Requests BOTTLES DRAWN AEROBIC AND ANAEROBIC 4CC EACH   Final   Culture NO GROWTH 2 DAYS   Final   Report Status PENDING   Incomplete  CULTURE, BLOOD (ROUTINE X 2)     Status: None   Collection Time    04/29/13  2:47 AM      Result Value Ref Range Status   Specimen Description BLOOD RIGHT ARM   Final   Special Requests BOTTLES DRAWN AEROBIC AND ANAEROBIC 4CC EACH   Final   Culture NO GROWTH 2 DAYS   Final   Report Status PENDING   Incomplete  URINE CULTURE     Status: None   Collection Time    04/29/13  2:50 PM      Result Value Ref Range Status   Specimen Description URINE, CLEAN CATCH   Final   Special Requests HEMATURIA   Final   Culture  Setup Time     Final   Value: 04/30/2013 00:18     Performed at [facility]     Final   Value: NO GROWTH     Performed at [facility]   Culture     Final   Value: NO GROWTH     Performed at [facility]   Report Status 04/30/2013 FINAL   Final     Labs: Basic Metabolic Panel:  Recent Labs Lab 04/28/13 2214 04/29/13 0336 05/01/13 0630  NA 135* 135* 139  K 3.8 3.4* 3.8  CL 94* 95* 100  CO2 29 28 33*  GLUCOSE 120* 130* 109*  BUN 10 10 7   CREATININE 1.13 1.15 1.06  CALCIUM 9.3 8.6 8.7   Liver Function Tests:  Recent Labs Lab 04/29/13 0336  AST 22  ALT 23  ALKPHOS 94  BILITOT 0.9  PROT 7.0  ALBUMIN 3.3*   No results found for this basename: LIPASE, AMYLASE,  in the last 168 hours No results found for  this basename: AMMONIA,  in the last 168 hours CBC:  Recent Labs Lab 04/28/13 2214 04/29/13 0500 04/29/13 1121 04/30/13 1249 05/01/13 0630  WBC 13.3* 15.2* 13.0* 8.8 6.6  NEUTROABS  11.0*  --   --  6.1  --   HGB 16.6 15.0 14.1 13.7 13.5  HCT 47.0 42.9 41.1 39.9 40.0  MCV 96.5 96.0 96.7 97.3 97.3  PLT 221 194 188 178 179   Cardiac Enzymes: No results found for this basename: CKTOTAL, CKMB, CKMBINDEX, TROPONINI,  in the last 168 hours BNP: BNP (last 3 results) No results found for this basename: PROBNP,  in the last 8760 hours CBG: No results found for this basename: GLUCAP,  in the last 168 hours     Signed:  Kathie Dike  [facility] 05/01/2013, 7:33 PM

## 2013-05-02 ENCOUNTER — Encounter (HOSPITAL_COMMUNITY): Payer: Self-pay | Admitting: Urology

## 2013-05-02 NOTE — Op Note (Signed)
NAMESEMAJ, Fernando [name]                 ACCOUNT NO.:  [id number]  MEDICAL RECORD NO.:  [id number]  LOCATION:  A340                          FACILITY:  [facility]  PHYSICIAN:  Marissa Nestle, M.D.DATE OF BIRTH:  10/06/1960  DATE OF PROCEDURE: DATE OF DISCHARGE:  05/01/2013                              OPERATIVE REPORT   PREOPERATIVE DIAGNOSES: 1. Gross hematuria. 2. Scrotal abscess.  PROCEDURE:  Cystoscopy and incision and drainage of scrotal abscess.  DESCRIPTION OF PROCEDURE:  The patient was given general endotracheal anesthesia in lithotomy position.  After usual prep and drape, a #22 rigid cystoscope was introduced under direct vision.  Anterior urethra looked normal.  Prostatic urethra was wide open, looked normal.  The bladder was 2+ trabeculated.  No tumor, stone, foreign body, or inflammation.  Both orifices located at normal side with a clear efflux. Cystoscope was removed and then I made a small 1 cm incision over this fluctuant area in the right hemiscrotum where I suspected that he has an abscess and the culture was taking, almost no purulent material came out, and I explored the cavity with the hemostat and left the area packed with iodoform gauze.  Dressing was applied.  The patient left the operating room in satisfactory condition.     Marissa Nestle, M.D.     MIJ/MEDQ  D:  05/01/2013  T:  05/01/2013  Job:  [id number]

## 2013-05-04 LAB — CULTURE, BLOOD (ROUTINE X 2)
CULTURE: NO GROWTH
Culture: NO GROWTH

## 2013-05-04 LAB — CULTURE, ROUTINE-ABSCESS: Culture: NO GROWTH

## 2013-05-06 LAB — ANAEROBIC CULTURE

## 2014-01-22 ENCOUNTER — Encounter (HOSPITAL_COMMUNITY): Payer: Self-pay | Admitting: Urology

## 2014-11-10 ENCOUNTER — Emergency Department (HOSPITAL_COMMUNITY): Payer: Self-pay

## 2014-11-10 ENCOUNTER — Emergency Department (HOSPITAL_COMMUNITY)
Admission: EM | Admit: 2014-11-10 | Discharge: 2014-11-11 | Disposition: A | Discharge status: Home / Self Care | Payer: Self-pay | Attending: Emergency Medicine | Admitting: Emergency Medicine

## 2014-11-10 ENCOUNTER — Encounter (HOSPITAL_COMMUNITY): Payer: Self-pay

## 2014-11-10 DIAGNOSIS — I10 Essential (primary) hypertension: Secondary | ICD-10-CM | POA: Insufficient documentation

## 2014-11-10 DIAGNOSIS — M791 Myalgia: Secondary | ICD-10-CM | POA: Insufficient documentation

## 2014-11-10 DIAGNOSIS — Z7982 Long term (current) use of aspirin: Secondary | ICD-10-CM | POA: Insufficient documentation

## 2014-11-10 DIAGNOSIS — F17210 Nicotine dependence, cigarettes, uncomplicated: Secondary | ICD-10-CM | POA: Insufficient documentation

## 2014-11-10 DIAGNOSIS — M7989 Other specified soft tissue disorders: Secondary | ICD-10-CM | POA: Insufficient documentation

## 2014-11-10 DIAGNOSIS — J40 Bronchitis, not specified as acute or chronic: Secondary | ICD-10-CM | POA: Insufficient documentation

## 2014-11-10 DIAGNOSIS — Z792 Long term (current) use of antibiotics: Secondary | ICD-10-CM | POA: Insufficient documentation

## 2014-11-10 DIAGNOSIS — Z79899 Other long term (current) drug therapy: Secondary | ICD-10-CM | POA: Insufficient documentation

## 2014-11-10 MED ORDER — PREDNISONE 50 MG PO TABS
60.0000 mg | ORAL_TABLET | Freq: Once | ORAL | Status: AC
  Administered 2014-11-11: 60 mg via ORAL
  Filled 2014-11-10 (×2): qty 1

## 2014-11-10 MED ORDER — ALBUTEROL SULFATE HFA 108 (90 BASE) MCG/ACT IN AERS
2.0000 | INHALATION_SPRAY | RESPIRATORY_TRACT | Status: DC | PRN
  Administered 2014-11-11: 2 via RESPIRATORY_TRACT
  Filled 2014-11-10: qty 6.7

## 2014-11-10 NOTE — ED Provider Notes (Signed)
CSN: [id number]     Arrival date & time 11/10/14  2258 History  By signing my name below, I, Meriel Pica, attest that this documentation has been prepared under the direction and in the presence of Merryl Hacker, MD. Electronically Signed: Meriel Pica, ED Scribe. 11/10/2014. 11:47 PM.  Chief Complaint  Patient presents with  . Cough    The history is provided by the patient. No language interpreter was used.   HPI Comments: Fernando [name] is a 54 y.o. male, with a PMhx of HTN, who presents to the Emergency Department complaining of a gradually worsening, constant cough that has been present for 1 week and has progressed to be productive with clear sputum. Pt is a current, daily smoker and notes worsening of his cough with smoking. He reports onset of his cough after being exposed to excessive dust and cooper while at work 6 days ago. He has taken several OTC URI medications without significant relief. Pt additionally reports BLE edemea, CP, and SOB that is worse on exertion. He is a long distance Administrator. Denies fevers or chills. No PMhx of cardiomyopathies.   Past Medical History  Diagnosis Date  . Hypertension 12/30/2011  . Allergy    Past Surgical History  Procedure Laterality Date  . Cervical fusion    . Facial cosmetic surgery    . Knee arthroscopy    . Appendectomy    . Hernia repair    . Spine surgery    . Cystoscopy N/A 05/01/2013    Procedure: CYSTOSCOPY;  Surgeon: Marissa Nestle, MD;  Location: [facility];  Service: Urology;  Laterality: N/A;  . Incision and drainage abscess Right 05/01/2013    Procedure: INCISION AND DRAINAGE RIGHT SCROTAL ABSCESS;  Surgeon: Marissa Nestle, MD;  Location: [facility];  Service: Urology;  Laterality: Right;   Family History  Problem Relation Age of Onset  . Breast cancer Mother    Social History  Substance Use Topics  . Smoking status: Current Every Day Smoker -- 0.50 packs/day for 8 years    Types: Cigarettes  . Smokeless  tobacco: None  . Alcohol Use: No     Comment: occ    Review of Systems  Constitutional: Negative for fever.  Respiratory: Positive for cough and shortness of breath.   Cardiovascular: Positive for leg swelling. Negative for chest pain.  Gastrointestinal: Negative for nausea, vomiting and abdominal pain.  Musculoskeletal: Positive for myalgias.  Neurological: Positive for headaches.  All other systems reviewed and are negative.  Allergies  Morphine and related  Home Medications   Prior to Admission medications   Medication Sig Start Date End Date Taking? Authorizing Provider  ALPRAZolam Duanne Moron) 1 MG tablet Take 1 mg by mouth 3 (three) times daily. 04/22/13  Yes Historical Provider, MD  lisinopril-hydrochlorothiazide (PRINZIDE,ZESTORETIC) 20-12.5 MG per tablet Take 1 tablet by mouth daily. 04/23/13  Yes Historical Provider, MD  albuterol (PROVENTIL HFA;VENTOLIN HFA) 108 (90 BASE) MCG/ACT inhaler Inhale 2 puffs into the lungs every 6 (six) hours as needed for wheezing or shortness of breath. 11/11/14   Merryl Hacker, MD  aspirin EC 81 MG tablet Take 81 mg by mouth daily as needed for mild pain or moderate pain.    Historical Provider, MD  doxycycline (VIBRAMYCIN) 50 MG capsule Take 1 capsule (50 mg total) by mouth 2 (two) times daily. 05/01/13   Kathie Dike, MD  HYDROcodone-acetaminophen (NORCO) 5-325 MG per tablet Take 1 tablet by mouth every 6 (six) hours  as needed for moderate pain. 05/01/13   Kathie Dike, MD  lovastatin (MEVACOR) 20 MG tablet Take 20 mg by mouth at bedtime.    Historical Provider, MD  predniSONE (DELTASONE) 20 MG tablet Take 3 tablets (60 mg total) by mouth daily with breakfast. 11/11/14   Merryl Hacker, MD   BP 148/93 mmHg  Pulse 70  Temp(Src) 98.1 F (36.7 C) (Oral)  Resp 22  Ht 6\' 2"  (1.88 m)  Wt 235 lb (106.595 kg)  BMI 30.16 kg/m2  SpO2 96% Physical Exam  Constitutional: He is oriented to person, place, and time. No distress.  HENT:  Head:  Normocephalic and atraumatic.  Mouth/Throat: Oropharynx is clear and moist.  Cardiovascular: Normal rate, regular rhythm and normal heart sounds.   No murmur heard. Pulmonary/Chest: Effort normal. No respiratory distress. He has wheezes. He has rales.  Abdominal: Soft. Bowel sounds are normal. There is no tenderness. There is no rebound.  Musculoskeletal: He exhibits edema.  1+ BLE edema  Neurological: He is alert and oriented to person, place, and time.  Skin: Skin is warm and dry.  Psychiatric: He has a normal mood and affect.  Nursing note and vitals reviewed.   ED Course  Procedures  DIAGNOSTIC STUDIES: Oxygen Saturation is 96% on RA, adequate by my interpretation.    COORDINATION OF CARE: 11:47 PM Discussed treatment plan with pt at bedside and pt agreed to plan. Will order steroids and an albuterol inhaler. Will also diagnostic labs, chest Xray, and EKG.   Labs Review Labs Reviewed  BASIC METABOLIC PANEL - Abnormal; Notable for the following:    Chloride 100 (*)    All other components within normal limits  TROPONIN I  BRAIN NATRIURETIC PEPTIDE  D-DIMER, QUANTITATIVE (NOT AT [facility])  CBC WITH DIFFERENTIAL/PLATELET    Imaging Review Dg Chest 2 View  11/10/2014  CLINICAL DATA:  Productive cough for 2 weeks, worsened today. EXAM: CHEST  2 VIEW COMPARISON:  None. FINDINGS: The heart size and mediastinal contours are within normal limits. Both lungs are clear. The visualized skeletal structures are unremarkable. IMPRESSION: No active cardiopulmonary disease. Electronically Signed   By: Andreas Newport M.D.   On: 11/10/2014 23:31   I have personally reviewed and evaluated these images and lab results as part of my medical decision-making.   EKG Interpretation   Date/Time:  Wednesday November 11 2014 00:42:56 EDT Ventricular Rate:  72 PR Interval:  171 QRS Duration: 85 QT Interval:  402 QTC Calculation: 440 R Axis:   -27 Text Interpretation:  Sinus rhythm Borderline  left axis deviation  Confirmed by Yazhini Mcaulay  MD, Dariona Postma ([id number]) on 11/11/2014 12:58:45 AM      MDM   Final diagnoses:  Bronchitis    Patient presents with cough, shortness of breath, and leg swelling. Nontoxic on exam. Afebrile. Does have expiratory wheezes. Given cough and wheezing, suspect acute bronchitis. However, patient also reports increasing shortness of breath, orthopnea, and lower extremity edema. 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 Given multiple other complaints, will do a full evaluation including EKG, troponin, d-dimer. EKG is nonischemic. Chest x-ray shows no evidence of pneumonia. Basic lab work including troponin and d-dimer is negative. Patient was given inhaler and prednisone. He reports significant improvement of his symptoms. He was able to ambulate and maintain pulse ox greater than 94%. Will treat for acute bronchitis. Discuss smoking cessation.  Discharge with prednisone and albuterol.  After history, exam, and  medical workup I feel the patient has been appropriately medically screened and is safe for discharge home. Pertinent diagnoses were discussed with the patient. Patient was given return precautions.  I personally performed the services described in this documentation, which was scribed in my presence. The recorded information has been reviewed and is accurate.     Merryl Hacker, MD 11/11/14 0110

## 2014-11-10 NOTE — ED Notes (Signed)
   11/10/14 2325  Cough  Cough Present Yes  Cough Congested  Pt c/o cough x one week with body aches and headache.

## 2014-11-10 NOTE — ED Notes (Signed)
Pt states he has been coughing for a week, reports headache and body pain as well.  Taking otc meds without relief.

## 2014-11-11 LAB — CBC WITH DIFFERENTIAL/PLATELET
Basophils Absolute: 0 10*3/uL (ref 0.0–0.1)
Basophils Relative: 0 %
EOS ABS: 0.5 10*3/uL (ref 0.0–0.7)
Eosinophils Relative: 8 %
HCT: 44.3 % (ref 39.0–52.0)
Hemoglobin: 15.7 g/dL (ref 13.0–17.0)
Lymphocytes Relative: 28 %
Lymphs Abs: 2 10*3/uL (ref 0.7–4.0)
MCH: 33.8 pg (ref 26.0–34.0)
MCHC: 35.4 g/dL (ref 30.0–36.0)
MCV: 95.5 fL (ref 78.0–100.0)
MONO ABS: 0.6 10*3/uL (ref 0.1–1.0)
MONOS PCT: 9 %
NEUTROS PCT: 55 %
Neutro Abs: 3.8 10*3/uL (ref 1.7–7.7)
Platelets: 206 10*3/uL (ref 150–400)
RBC: 4.64 MIL/uL (ref 4.22–5.81)
RDW: 13.2 % (ref 11.5–15.5)
WBC: 7 10*3/uL (ref 4.0–10.5)

## 2014-11-11 LAB — BASIC METABOLIC PANEL
Anion gap: 9 (ref 5–15)
BUN: 11 mg/dL (ref 6–20)
CHLORIDE: 100 mmol/L — AB (ref 101–111)
CO2: 28 mmol/L (ref 22–32)
Calcium: 9.2 mg/dL (ref 8.9–10.3)
Creatinine, Ser: 1.18 mg/dL (ref 0.61–1.24)
GFR calc Af Amer: >60 mL/min (ref >60)
GFR calc non Af Amer: >60 mL/min (ref >60)
Glucose, Bld: 99 mg/dL (ref 65–99)
Potassium: 3.6 mmol/L (ref 3.5–5.1)
SODIUM: 137 mmol/L (ref 135–145)

## 2014-11-11 LAB — D-DIMER, QUANTITATIVE: D-Dimer, Quant: 0.33 ug/mL-FEU (ref 0.00–0.48)

## 2014-11-11 LAB — BRAIN NATRIURETIC PEPTIDE: B Natriuretic Peptide: 33 pg/mL (ref 0.0–100.0)

## 2014-11-11 LAB — TROPONIN I

## 2014-11-11 MED ORDER — PREDNISONE 20 MG PO TABS: 60.0000 mg | ORAL_TABLET | Freq: Every day | ORAL | Status: DC

## 2014-11-11 MED ORDER — ALBUTEROL SULFATE HFA 108 (90 BASE) MCG/ACT IN AERS: 2.0000 | INHALATION_SPRAY | Freq: Four times a day (QID) | RESPIRATORY_TRACT | Status: DC | PRN

## 2014-11-11 NOTE — ED Notes (Signed)
Patient ambulated with pulse ox. Saturation range from 94 to 97 percent on room air. Patient states that he feels ok no wheezing, shortness of breath.

## 2014-11-11 NOTE — Discharge Instructions (Signed)

## 2015-03-07 ENCOUNTER — Emergency Department (HOSPITAL_COMMUNITY): Payer: Self-pay

## 2015-03-07 ENCOUNTER — Inpatient Hospital Stay (HOSPITAL_COMMUNITY)
Admission: EM | Admit: 2015-03-07 | Discharge: 2015-03-12 | DRG: 393 | Disposition: A | Discharge status: Home / Self Care | Payer: Self-pay | Attending: Internal Medicine | Admitting: Internal Medicine

## 2015-03-07 ENCOUNTER — Encounter (HOSPITAL_COMMUNITY): Payer: Self-pay | Admitting: Emergency Medicine

## 2015-03-07 DIAGNOSIS — I10 Essential (primary) hypertension: Secondary | ICD-10-CM | POA: Diagnosis present

## 2015-03-07 DIAGNOSIS — R9389 Abnormal findings on diagnostic imaging of other specified body structures: Secondary | ICD-10-CM | POA: Diagnosis present

## 2015-03-07 DIAGNOSIS — N179 Acute kidney failure, unspecified: Secondary | ICD-10-CM | POA: Diagnosis present

## 2015-03-07 DIAGNOSIS — I119 Hypertensive heart disease without heart failure: Secondary | ICD-10-CM | POA: Diagnosis present

## 2015-03-07 DIAGNOSIS — J471 Bronchiectasis with (acute) exacerbation: Secondary | ICD-10-CM | POA: Diagnosis present

## 2015-03-07 DIAGNOSIS — Z803 Family history of malignant neoplasm of breast: Secondary | ICD-10-CM

## 2015-03-07 DIAGNOSIS — K611 Rectal abscess: Secondary | ICD-10-CM

## 2015-03-07 DIAGNOSIS — E860 Dehydration: Secondary | ICD-10-CM | POA: Diagnosis present

## 2015-03-07 DIAGNOSIS — F17210 Nicotine dependence, cigarettes, uncomplicated: Secondary | ICD-10-CM | POA: Diagnosis present

## 2015-03-07 DIAGNOSIS — F419 Anxiety disorder, unspecified: Secondary | ICD-10-CM | POA: Diagnosis present

## 2015-03-07 DIAGNOSIS — Z79891 Long term (current) use of opiate analgesic: Secondary | ICD-10-CM

## 2015-03-07 DIAGNOSIS — R197 Diarrhea, unspecified: Secondary | ICD-10-CM | POA: Diagnosis present

## 2015-03-07 DIAGNOSIS — J470 Bronchiectasis with acute lower respiratory infection: Secondary | ICD-10-CM | POA: Diagnosis present

## 2015-03-07 DIAGNOSIS — L03317 Cellulitis of buttock: Secondary | ICD-10-CM

## 2015-03-07 DIAGNOSIS — K612 Anorectal abscess: Principal | ICD-10-CM | POA: Diagnosis present

## 2015-03-07 DIAGNOSIS — Z7952 Long term (current) use of systemic steroids: Secondary | ICD-10-CM

## 2015-03-07 DIAGNOSIS — K610 Anal abscess: Secondary | ICD-10-CM

## 2015-03-07 DIAGNOSIS — J189 Pneumonia, unspecified organism: Secondary | ICD-10-CM

## 2015-03-07 HISTORY — DX: Rectal abscess: K61.1

## 2015-03-07 LAB — COMPREHENSIVE METABOLIC PANEL
ALBUMIN: 3.5 g/dL (ref 3.5–5.0)
ALK PHOS: 89 U/L (ref 38–126)
ALT: 20 U/L (ref 17–63)
ANION GAP: 7 (ref 5–15)
AST: 21 U/L (ref 15–41)
BUN: 12 mg/dL (ref 6–20)
CO2: 25 mmol/L (ref 22–32)
Calcium: 8.3 mg/dL — ABNORMAL LOW (ref 8.9–10.3)
Chloride: 108 mmol/L (ref 101–111)
Creatinine, Ser: 1.25 mg/dL — ABNORMAL HIGH (ref 0.61–1.24)
GFR calc Af Amer: >60 mL/min (ref >60)
GFR calc non Af Amer: >60 mL/min (ref >60)
GLUCOSE: 109 mg/dL — AB (ref 65–99)
POTASSIUM: 3.7 mmol/L (ref 3.5–5.1)
SODIUM: 140 mmol/L (ref 135–145)
Total Bilirubin: 0.2 mg/dL — ABNORMAL LOW (ref 0.3–1.2)
Total Protein: 7.2 g/dL (ref 6.5–8.1)

## 2015-03-07 LAB — CBC WITH DIFFERENTIAL/PLATELET
BASOS PCT: 0 %
Basophils Absolute: 0 10*3/uL (ref 0.0–0.1)
Eosinophils Absolute: 0.5 10*3/uL (ref 0.0–0.7)
Eosinophils Relative: 4 %
HEMATOCRIT: 40.7 % (ref 39.0–52.0)
Hemoglobin: 13.8 g/dL (ref 13.0–17.0)
Lymphocytes Relative: 15 %
Lymphs Abs: 1.9 10*3/uL (ref 0.7–4.0)
MCH: 32.5 pg (ref 26.0–34.0)
MCHC: 33.9 g/dL (ref 30.0–36.0)
MCV: 96 fL (ref 78.0–100.0)
MONO ABS: 1.2 10*3/uL — AB (ref 0.1–1.0)
Monocytes Relative: 10 %
NEUTROS ABS: 8.7 10*3/uL — AB (ref 1.7–7.7)
NEUTROS PCT: 71 %
Platelets: 237 10*3/uL (ref 150–400)
RBC: 4.24 MIL/uL (ref 4.22–5.81)
RDW: 13.9 % (ref 11.5–15.5)
WBC: 12.3 10*3/uL — ABNORMAL HIGH (ref 4.0–10.5)

## 2015-03-07 LAB — URINALYSIS, ROUTINE W REFLEX MICROSCOPIC
Bilirubin Urine: NEGATIVE
GLUCOSE, UA: NEGATIVE mg/dL
Hgb urine dipstick: NEGATIVE
Ketones, ur: NEGATIVE mg/dL
LEUKOCYTES UA: NEGATIVE
Nitrite: NEGATIVE
PH: 6 (ref 5.0–8.0)
Protein, ur: NEGATIVE mg/dL
Specific Gravity, Urine: 1.015 (ref 1.005–1.030)

## 2015-03-07 MED ORDER — VANCOMYCIN HCL IN DEXTROSE 1-5 GM/200ML-% IV SOLN
1000.0000 mg | Freq: Once | INTRAVENOUS | Status: AC
  Administered 2015-03-07: 1000 mg via INTRAVENOUS
  Filled 2015-03-07: qty 200

## 2015-03-07 MED ORDER — PIPERACILLIN-TAZOBACTAM 3.375 G IVPB
3.3750 g | Freq: Once | INTRAVENOUS | Status: AC
  Administered 2015-03-07: 3.375 g via INTRAVENOUS
  Filled 2015-03-07: qty 50

## 2015-03-07 MED ORDER — ALPRAZOLAM 1 MG PO TABS
1.0000 mg | ORAL_TABLET | Freq: Three times a day (TID) | ORAL | Status: DC | PRN
  Administered 2015-03-07 – 2015-03-12 (×10): 1 mg via ORAL
  Filled 2015-03-07 (×11): qty 1

## 2015-03-07 MED ORDER — SODIUM CHLORIDE 0.9 % IV SOLN
Freq: Once | INTRAVENOUS | Status: AC
  Administered 2015-03-07: 16:00:00 via INTRAVENOUS

## 2015-03-07 MED ORDER — ONDANSETRON HCL 4 MG/2ML IJ SOLN
4.0000 mg | Freq: Once | INTRAMUSCULAR | Status: AC
  Administered 2015-03-07: 4 mg via INTRAVENOUS
  Filled 2015-03-07: qty 2

## 2015-03-07 MED ORDER — PIPERACILLIN-TAZOBACTAM 3.375 G IVPB
3.3750 g | Freq: Three times a day (TID) | INTRAVENOUS | Status: DC
  Administered 2015-03-07 – 2015-03-11 (×13): 3.375 g via INTRAVENOUS
  Filled 2015-03-07 (×12): qty 50

## 2015-03-07 MED ORDER — ENOXAPARIN SODIUM 40 MG/0.4ML ~~LOC~~ SOLN
40.0000 mg | SUBCUTANEOUS | Status: DC
  Administered 2015-03-08 – 2015-03-12 (×5): 40 mg via SUBCUTANEOUS
  Filled 2015-03-07 (×5): qty 0.4

## 2015-03-07 MED ORDER — DIATRIZOATE MEGLUMINE & SODIUM 66-10 % PO SOLN
ORAL | Status: AC
  Filled 2015-03-07: qty 30

## 2015-03-07 MED ORDER — ALBUTEROL SULFATE (2.5 MG/3ML) 0.083% IN NEBU
3.0000 mL | INHALATION_SOLUTION | Freq: Four times a day (QID) | RESPIRATORY_TRACT | Status: DC | PRN
  Administered 2015-03-07 – 2015-03-12 (×4): 3 mL via RESPIRATORY_TRACT
  Filled 2015-03-07 (×4): qty 3

## 2015-03-07 MED ORDER — HYDROMORPHONE HCL 1 MG/ML IJ SOLN
0.5000 mg | INTRAMUSCULAR | Status: DC | PRN
  Administered 2015-03-07 – 2015-03-10 (×13): 0.5 mg via INTRAVENOUS
  Filled 2015-03-07 (×13): qty 1

## 2015-03-07 MED ORDER — HYDROCHLOROTHIAZIDE 12.5 MG PO CAPS
12.5000 mg | ORAL_CAPSULE | Freq: Every day | ORAL | Status: DC
  Administered 2015-03-08 – 2015-03-12 (×5): 12.5 mg via ORAL
  Filled 2015-03-07 (×5): qty 1

## 2015-03-07 MED ORDER — LISINOPRIL 10 MG PO TABS
20.0000 mg | ORAL_TABLET | Freq: Every day | ORAL | Status: DC
  Administered 2015-03-08 – 2015-03-12 (×5): 20 mg via ORAL
  Filled 2015-03-07 (×5): qty 2

## 2015-03-07 MED ORDER — PIPERACILLIN-TAZOBACTAM 3.375 G IVPB: 3.3750 g | Freq: Once | INTRAVENOUS | Status: DC

## 2015-03-07 MED ORDER — PIPERACILLIN-TAZOBACTAM 3.375 G IVPB 30 MIN: 3.3750 g | Freq: Three times a day (TID) | INTRAVENOUS | Status: DC

## 2015-03-07 MED ORDER — SODIUM CHLORIDE 0.9 % IV SOLN
Freq: Once | INTRAVENOUS | Status: AC
  Administered 2015-03-07: 18:00:00 via INTRAVENOUS

## 2015-03-07 MED ORDER — HYDROMORPHONE HCL 1 MG/ML IJ SOLN
1.0000 mg | Freq: Once | INTRAMUSCULAR | Status: AC
  Administered 2015-03-07: 1 mg via INTRAVENOUS
  Filled 2015-03-07: qty 1

## 2015-03-07 MED ORDER — DEXTROSE-NACL 5-0.9 % IV SOLN
INTRAVENOUS | Status: DC
Start: 2015-03-07 — End: 2015-03-12
  Administered 2015-03-07 – 2015-03-11 (×5): via INTRAVENOUS

## 2015-03-07 MED ORDER — LORAZEPAM 2 MG/ML IJ SOLN
1.0000 mg | Freq: Once | INTRAMUSCULAR | Status: AC
  Administered 2015-03-07: 1 mg via INTRAVENOUS
  Filled 2015-03-07: qty 1

## 2015-03-07 MED ORDER — IOHEXOL 300 MG/ML  SOLN
100.0000 mL | Freq: Once | INTRAMUSCULAR | Status: AC | PRN
  Administered 2015-03-07: 100 mL via INTRAVENOUS

## 2015-03-07 NOTE — ED Provider Notes (Addendum)
CSN: [id number]     Arrival date & time 03/07/15  1551 History   First MD Initiated Contact with Patient 03/07/15 1603     Chief Complaint  Patient presents with  . Rectal Pain     (Consider location/radiation/quality/duration/timing/severity/associated sxs/prior Treatment) HPI Comments:  Patient reports 3 week history of rectal pain that is fairly constant worse after having a bowel movement. States history of perirectal abscess that was drained in 1998. Symptoms are similar. Says he has frequent loose stools that her "80% loose and 20% hard". Denies any blood in his stools. Has had nausea but no vomiting. No fever. Patient works as a Magazine features editor. Denies any urinary symptoms. Denies abdominal pain. Still has a good appetite. Denies any testicular pain. Denies any dizziness or lightheadedness.  The history is provided by the patient.    Past Medical History  Diagnosis Date  . Hypertension 12/30/2011  . Allergy   . Peri-rectal abscess    Past Surgical History  Procedure Laterality Date  . Cervical fusion    . Facial cosmetic surgery    . Knee arthroscopy    . Appendectomy    . Hernia repair    . Spine surgery    . Cystoscopy N/A 05/01/2013    Procedure: CYSTOSCOPY;  Surgeon: Marissa Nestle, MD;  Location: [facility];  Service: Urology;  Laterality: N/A;  . Incision and drainage abscess Right 05/01/2013    Procedure: INCISION AND DRAINAGE RIGHT SCROTAL ABSCESS;  Surgeon: Marissa Nestle, MD;  Location: [facility];  Service: Urology;  Laterality: Right;   Family History  Problem Relation Age of Onset  . Breast cancer Mother    Social History  Substance Use Topics  . Smoking status: Current Every Day Smoker -- 0.50 packs/day for 8 years    Types: Cigarettes  . Smokeless tobacco: None  . Alcohol Use: No     Comment: occ    Review of Systems  Constitutional: Negative for fever, activity change and appetite change.  HENT: Negative for congestion and rhinorrhea.    Respiratory: Negative for cough, chest tightness and shortness of breath.   Cardiovascular: Negative for chest pain.  Gastrointestinal: Positive for diarrhea, constipation and rectal pain. Negative for vomiting, abdominal pain and blood in stool.  Genitourinary: Negative for dysuria, urgency and hematuria.  Musculoskeletal: Negative for back pain, joint swelling and arthralgias.  Skin: Negative for rash.  Neurological: Negative for dizziness, weakness and headaches.  A complete 10 system review of systems was obtained and all systems are negative except as noted in the HPI and PMH.      Allergies  Morphine and related  Home Medications   Prior to Admission medications   Medication Sig Start Date End Date Taking? Authorizing Provider  ALPRAZolam Duanne Moron) 1 MG tablet Take 1 mg by mouth 3 (three) times daily as needed for anxiety.  04/22/13  Yes Historical Provider, MD  lisinopril-hydrochlorothiazide (PRINZIDE,ZESTORETIC) 20-12.5 MG per tablet Take 1 tablet by mouth daily. 04/23/13  Yes Historical Provider, MD  albuterol (PROVENTIL HFA;VENTOLIN HFA) 108 (90 BASE) MCG/ACT inhaler Inhale 2 puffs into the lungs every 6 (six) hours as needed for wheezing or shortness of breath. 11/11/14   Merryl Hacker, MD  doxycycline (VIBRAMYCIN) 50 MG capsule Take 1 capsule (50 mg total) by mouth 2 (two) times daily. 05/01/13   Kathie Dike, MD  HYDROcodone-acetaminophen (NORCO) 5-325 MG per tablet Take 1 tablet by mouth every 6 (six) hours as needed for moderate pain. 05/01/13  Kathie Dike, MD  predniSONE (DELTASONE) 20 MG tablet Take 3 tablets (60 mg total) by mouth daily with breakfast. 11/11/14   Merryl Hacker, MD   BP 138/93 mmHg  Pulse 72  Temp(Src) 98 F (36.7 C) (Oral)  Resp 16  Ht 6\' 2"  (1.88 m)  Wt 245 lb (111.131 kg)  BMI 31.44 kg/m2  SpO2 96% Physical Exam  Constitutional: He is oriented to person, place, and time. He appears well-developed and well-nourished. No distress.   Laying on R side, very uncomfortable  HENT:  Head: Normocephalic and atraumatic.  Mouth/Throat: Oropharynx is clear and moist. No oropharyngeal exudate.  Eyes: Conjunctivae and EOM are normal. Pupils are equal, round, and reactive to light.  Neck: Normal range of motion. Neck supple.  No meningismus.  Cardiovascular: Normal rate, regular rhythm, normal heart sounds and intact distal pulses.   No murmur heard. Pulmonary/Chest: Effort normal and breath sounds normal. No respiratory distress.  Abdominal: Soft. There is no tenderness. There is no rebound and no guarding.  Genitourinary:   There is slight induration along left buttock superior to the anus. There is no fluctuance or erythema.  External anus appears normal. There is no hemorrhoid or fissure. Patient with extreme pain on attempted rectal exam which was stopped.  Musculoskeletal: Normal range of motion. He exhibits no edema or tenderness.  Neurological: He is alert and oriented to person, place, and time. No cranial nerve deficit. He exhibits normal muscle tone. Coordination normal.  No ataxia on finger to nose bilaterally. No pronator drift. 5/5 strength throughout. CN 2-12 intact.Equal grip strength. Sensation intact.   Skin: Skin is warm.  Psychiatric: He has a normal mood and affect. His behavior is normal.  Nursing note and vitals reviewed.   ED Course  Procedures (including critical care time) Labs Review Labs Reviewed  CBC WITH DIFFERENTIAL/PLATELET - Abnormal; Notable for the following:    WBC 12.3 (*)    Neutro Abs 8.7 (*)    Monocytes Absolute 1.2 (*)    All other components within normal limits  COMPREHENSIVE METABOLIC PANEL - Abnormal; Notable for the following:    Glucose, Bld 109 (*)    Creatinine, Ser 1.25 (*)    Calcium 8.3 (*)    Total Bilirubin 0.2 (*)    All other components within normal limits  CULTURE, BLOOD (ROUTINE X 2)  CULTURE, BLOOD (ROUTINE X 2)  URINALYSIS, ROUTINE W REFLEX MICROSCOPIC  (NOT AT [facility])  POC OCCULT BLOOD, ED    Imaging Review Dg Chest 2 View  03/07/2015  CLINICAL DATA:  Patient with airspace opacities on recent CT. EXAM: CHEST  2 VIEW COMPARISON:  Chest radiograph 11/10/2014 FINDINGS: Anterior cervical spinal fusion hardware. Stable cardiomegaly and tortuosity of the thoracic aorta. Focal consolidation is demonstrated within the left lower lobe. Additionally consolidative opacities are demonstrated within the peripheral right mid lung. No pleural effusion or pneumothorax. Mid thoracic spine degenerative changes. IMPRESSION: Right mid lung and left lower lobe consolidative opacities which are nonspecific however may represent infection in the appropriate clinical setting. Additional consideration of pulmonary mass is not excluded. Followup PA and lateral chest X-ray is recommended in 3-4 weeks following trial of antibiotic therapy to ensure resolution and exclude underlying malignancy. Electronically Signed   By: Lovey Newcomer M.D.   On: 03/07/2015 19:02   Ct Abdomen Pelvis W Contrast  03/07/2015  CLINICAL DATA:  55 year old presenting with a 3 week history of rectal pain. Personal history of perirectal abscess requiring surgical drainage.  Surgical history also includes appendectomy and unspecified hernia repair. EXAM: CT ABDOMEN AND PELVIS WITH CONTRAST TECHNIQUE: Multidetector CT imaging of the abdomen and pelvis was performed using the standard protocol following bolus administration of intravenous contrast. CONTRAST:  150mL OMNIPAQUE IOHEXOL 300 MG/ML IV. Oral contrast was also administered. COMPARISON:  04/29/2013, 08/17/2009, 10/30/2006, 09/16/2006. FINDINGS: Lower chest: Mass or focal airspace consolidation involving the left lower lobe, incompletely imaged. Heart size upper normal. Hepatobiliary: Liver normal in size and appearance. Gallbladder normal in appearance without calcified gallstones. No biliary ductal dilation. Pancreas: Normal in appearance without evidence  of mass, ductal dilation, or inflammation. Spleen: Normal in size and appearance. Adrenals/Urinary Tract: Normal appearing adrenal glands. Kidneys normal in size and appearance without focal parenchymal abnormality. No evidence of urinary tract calculi or obstruction. Normal-appearing urinary bladder. Stomach/Bowel: Normal-appearing stomach filled with food. Normal-appearing small bowel. Numerous small bowel loops lateral to the ascending colon, a new finding. Edema/inflammation surrounding the distal rectum, extending into the perirectal and perianal tissues below the anal verge. Approximate 2.9 x 1.3 x 1.7 cm fluid collection in the perianal region. Remainder of the colon decompressed and unremarkable. Appendix surgically absent. Vascular/Lymphatic: Mild iliofemoral atherosclerosis. Patent visceral arteries. Normal-appearing portal venous and systemic venous systems. Scattered normal sized retroperitoneal lymph nodes. No pathologic lymphadenopathy. Reproductive: Mild median lobe prostate gland enlargement as noted previously. Normal seminal vesicles. Other: None. 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 Facet degenerative changes at L4-5 and L5-S1. No acute abnormality. IMPRESSION: 1. Perirectal and perianal inflammation with a small perianal abscess measured above. 2. Small bowel loops lateral to the ascending colon which may indicate an internal hernia. 3. Airspace consolidation versus mass involving the visualized left lower lobe, incompletely imaged on the current CT. 4. Stable mild median lobe prostate gland enlargement (likely BPH). Electronically Signed   By: Evangeline Dakin M.D.   On: 03/07/2015 18:02   I have personally reviewed and evaluated these images and lab results as part of my medical decision-making.   EKG Interpretation None      MDM   Final diagnoses:  Cellulitis  of buttock, left  Perianal abscess  CAP (community acquired pneumonia)    rectal pain with high suspicion for recurrent perirectal abscess. Vital stable. Afebrile.   Leukocytosis noted. CT scan results discussed with Dr. Arnoldo Morale. Perirectal and perianal inflammation. He agrees with IV antibiotics and hospitalist admission. Patient with no abdominal pain or vomiting so possible internal hernia does not seem likely.   We'll obtain chest x-ray given airspace disease seen on CT scan.   On external exam there is no area of fluctuance or palpable tenderness along patient's perianal region.  IV antibiotics for perirectal cellulitis and possible abscess. Chest x-ray shows air space consolidation and possible pneumonia as well. Patient desaturates to the mid 80s once asleep. Denies any history of sleep apnea.  Admission d/w Dr. Burnadette Pop, MD 03/07/15 2133  Ezequiel Essex, MD 03/07/15 2134

## 2015-03-07 NOTE — ED Notes (Signed)
Patient complaining of rectal pain x 3 weeks. States he has history of perirectal abscess with surgery.

## 2015-03-07 NOTE — H&P (Signed)
[facility] History and Physical  Fernando [name] [id number] DOB: 05-19-1960    PCP:   Collene Mares, PA-C   Chief Complaint: rectal pain.   HPI: Fernando [name] is an 55 y.o. male with hx of HTN, prior hx of rectal abscess requiring surgery in the distant past, no hx of crohn's disease, presented with rectal pain for several days.  He denied fever, chills, rectal bleeding, and had BM yesterday.  Evaluation in the ER included a leukocytosis with WBC of 12K, and Cr of 1.25.  His abd/pelvic CT showed peri rectal abscess, and possible infiltrate. Radiologist had recommneded a follow CXR in 3 months. Surgical consultation with Aviva Signs who recommnended IV antibiotics, and he will see him in the morning.  Hospitalist was asked to admit him for same.   Rewiew of Systems:  Constitutional: Negative for malaise, fever and chills. No significant weight loss or weight gain Eyes: Negative for eye pain, redness and discharge, diplopia, visual changes, or flashes of light. ENMT: Negative for ear pain, hoarseness, nasal congestion, sinus pressure and sore throat. No headaches; tinnitus, drooling, or problem swallowing. Cardiovascular: Negative for chest pain, palpitations, diaphoresis, dyspnea and peripheral edema. ; No orthopnea, PND Respiratory: Negative for cough, hemoptysis, wheezing and stridor. No pleuritic chestpain. Gastrointestinal: Negative for nausea, vomiting, diarrhea, constipation, abdominal pain, melena, blood in stool, hematemesis, jaundice and rectal bleeding.    Genitourinary: Negative for frequency, dysuria, incontinence,flank pain and hematuria; Musculoskeletal: Negative for back pain and neck pain. Negative for swelling and trauma.;  Skin: . Negative for pruritus, rash, abrasions, bruising and skin lesion.; ulcerations Neuro: Negative for headache, lightheadedness and neck stiffness. Negative for weakness, altered level of consciousness , altered mental status, extremity  weakness, burning feet, involuntary movement, seizure and syncope.  Psych: negative for anxiety, depression, insomnia, tearfulness, panic attacks, hallucinations, paranoia, suicidal or homicidal ideation    Past Medical History  Diagnosis Date  . Hypertension 12/30/2011  . Allergy   . Peri-rectal abscess     Past Surgical History  Procedure Laterality Date  . Cervical fusion    . Facial cosmetic surgery    . Knee arthroscopy    . Appendectomy    . Hernia repair    . Spine surgery    . Cystoscopy N/A 05/01/2013    Procedure: CYSTOSCOPY;  Surgeon: Marissa Nestle, MD;  Location: [facility];  Service: Urology;  Laterality: N/A;  . Incision and drainage abscess Right 05/01/2013    Procedure: INCISION AND DRAINAGE RIGHT SCROTAL ABSCESS;  Surgeon: Marissa Nestle, MD;  Location: [facility];  Service: Urology;  Laterality: Right;    Medications:  HOME MEDS: Prior to Admission medications   Medication Sig Start Date End Date Taking? Authorizing Provider  ALPRAZolam Duanne Moron) 1 MG tablet Take 1 mg by mouth 3 (three) times daily as needed for anxiety.  04/22/13  Yes Historical Provider, MD  lisinopril-hydrochlorothiazide (PRINZIDE,ZESTORETIC) 20-12.5 MG per tablet Take 1 tablet by mouth daily. 04/23/13  Yes Historical Provider, MD  albuterol (PROVENTIL HFA;VENTOLIN HFA) 108 (90 BASE) MCG/ACT inhaler Inhale 2 puffs into the lungs every 6 (six) hours as needed for wheezing or shortness of breath. 11/11/14   Merryl Hacker, MD  doxycycline (VIBRAMYCIN) 50 MG capsule Take 1 capsule (50 mg total) by mouth 2 (two) times daily. 05/01/13   Kathie Dike, MD  HYDROcodone-acetaminophen (NORCO) 5-325 MG per tablet Take 1 tablet by mouth every 6 (six) hours as needed for moderate pain. 05/01/13   Kathie Dike,  MD  predniSONE (DELTASONE) 20 MG tablet Take 3 tablets (60 mg total) by mouth daily with breakfast. 11/11/14   Merryl Hacker, MD     Allergies:  Allergies  Allergen Reactions  . Morphine And  Related Other (See Comments)    hallucinations    Social History:   reports that he has been smoking Cigarettes.  He has a 4 pack-year smoking history. He does not have any smokeless tobacco history on file. He reports that he does not drink alcohol or use illicit drugs.  Family History: Family History  Problem Relation Age of Onset  . Breast cancer Mother      Physical Exam: Filed Vitals:   03/07/15 1555 03/07/15 1711 03/07/15 1800 03/07/15 1900  BP: 154/93 121/81 127/92 142/94  Pulse: 87 79 80 78  Temp: 98.7 F (37.1 C) 98 F (36.7 C)    TempSrc: Oral     Resp: 18 18  16   Height: 6\' 2"  (1.88 m)     Weight: 111.131 kg (245 lb)     SpO2: 100% 94% 93% 93%   Blood pressure 142/94, pulse 78, temperature 98 F (36.7 C), temperature source Oral, resp. rate 16, height 6\' 2"  (1.88 m), weight 111.131 kg (245 lb), SpO2 93 %.  GEN:  Pleasant patient lying in the stretcher in no acute distress; cooperative with exam. PSYCH:  alert and oriented x4; does not appear anxious or depressed; affect is appropriate. HEENT: Mucous membranes pink and anicteric; PERRLA; EOM intact; no cervical lymphadenopathy nor thyromegaly or carotid bruit; no JVD; There were no stridor. Neck is very supple. Breasts:: Not examined CHEST WALL: No tenderness CHEST: Normal respiration, clear to auscultation bilaterally.  HEART: Regular rate and rhythm.  There are no murmur, rub, or gallops.   BACK: No kyphosis or scoliosis; no CVA tenderness ABDOMEN: soft and non-tender; no masses, no organomegaly, normal abdominal bowel sounds; no pannus; no intertriginous candida. There is no rebound and no distention. Rectal Exam: Not done. EXTREMITIES: No bone or joint deformity; age-appropriate arthropathy of the hands and knees; no edema; no ulcerations.  There is no calf tenderness. Genitalia: not examined PULSES: 2+ and symmetric SKIN: Normal hydration no rash or ulceration CNS: Cranial nerves 2-12 grossly intact no  focal lateralizing neurologic deficit.  Speech is fluent; uvula elevated with phonation, facial symmetry and tongue midline. DTR are normal bilaterally, cerebella exam is intact, barbinski is negative and strengths are equaled bilaterally.  No sensory loss.   Labs on Admission:  Basic Metabolic Panel:  Recent Labs Lab 03/07/15 1619  NA 140  K 3.7  CL 108  CO2 25  GLUCOSE 109*  BUN 12  CREATININE 1.25*  CALCIUM 8.3*   Liver Function Tests:  Recent Labs Lab 03/07/15 1619  AST 21  ALT 20  ALKPHOS 89  BILITOT 0.2*  PROT 7.2  ALBUMIN 3.5   CBC:  Recent Labs Lab 03/07/15 1619  WBC 12.3*  NEUTROABS 8.7*  HGB 13.8  HCT 40.7  MCV 96.0  PLT 237    Radiological Exams on Admission: Dg Chest 2 View  03/07/2015  CLINICAL DATA:  Patient with airspace opacities on recent CT. EXAM: CHEST  2 VIEW COMPARISON:  Chest radiograph 11/10/2014 FINDINGS: Anterior cervical spinal fusion hardware. Stable cardiomegaly and tortuosity of the thoracic aorta. Focal consolidation is demonstrated within the left lower lobe. Additionally consolidative opacities are demonstrated within the peripheral right mid lung. No pleural effusion or pneumothorax. Mid thoracic spine degenerative changes. IMPRESSION: Right mid  lung and left lower lobe consolidative opacities which are nonspecific however may represent infection in the appropriate clinical setting. Additional consideration of pulmonary mass is not excluded. Followup PA and lateral chest X-ray is recommended in 3-4 weeks following trial of antibiotic therapy to ensure resolution and exclude underlying malignancy. Electronically Signed   By: Lovey Newcomer M.D.   On: 03/07/2015 19:02   Ct Abdomen Pelvis W Contrast  03/07/2015  CLINICAL DATA:  55 year old presenting with a 3 week history of rectal pain. Personal history of perirectal abscess requiring surgical drainage. Surgical history also includes appendectomy and unspecified hernia repair. EXAM: CT  ABDOMEN AND PELVIS WITH CONTRAST TECHNIQUE: Multidetector CT imaging of the abdomen and pelvis was performed using the standard protocol following bolus administration of intravenous contrast. CONTRAST:  177mL OMNIPAQUE IOHEXOL 300 MG/ML IV. Oral contrast was also administered. COMPARISON:  04/29/2013, 08/17/2009, 10/30/2006, 09/16/2006. FINDINGS: Lower chest: Mass or focal airspace consolidation involving the left lower lobe, incompletely imaged. Heart size upper normal. Hepatobiliary: Liver normal in size and appearance. Gallbladder normal in appearance without calcified gallstones. No biliary ductal dilation. Pancreas: Normal in appearance without evidence of mass, ductal dilation, or inflammation. Spleen: Normal in size and appearance. Adrenals/Urinary Tract: Normal appearing adrenal glands. Kidneys normal in size and appearance without focal parenchymal abnormality. No evidence of urinary tract calculi or obstruction. Normal-appearing urinary bladder. Stomach/Bowel: Normal-appearing stomach filled with food. Normal-appearing small bowel. Numerous small bowel loops lateral to the ascending colon, a new finding. Edema/inflammation surrounding the distal rectum, extending into the perirectal and perianal tissues below the anal verge. Approximate 2.9 x 1.3 x 1.7 cm fluid collection in the perianal region. Remainder of the colon decompressed and unremarkable. Appendix surgically absent. Vascular/Lymphatic: Mild iliofemoral atherosclerosis. Patent visceral arteries. Normal-appearing portal venous and systemic venous systems. Scattered normal sized retroperitoneal lymph nodes. No pathologic lymphadenopathy. Reproductive: Mild median lobe prostate gland enlargement as noted previously. Normal seminal vesicles. Other: None. 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 Facet degenerative changes at L4-5 and  L5-S1. No acute abnormality. IMPRESSION: 1. Perirectal and perianal inflammation with a small perianal abscess measured above. 2. Small bowel loops lateral to the ascending colon which may indicate an internal hernia. 3. Airspace consolidation versus mass involving the visualized left lower lobe, incompletely imaged on the current CT. 4. Stable mild median lobe prostate gland enlargement (likely BPH). Electronically Signed   By: Evangeline Dakin M.D.   On: 03/07/2015 18:02    EKG: Independently reviewed.    Assessment/Plan Present on Admission:  . Peri-rectal abscess . Hypertension   PLAN:    Peri rectal abscess:  Will continue with IV Zosyn.  IVF, and we will try to give clear liquid, as it is quite painful for him to have any BM.  Surgery will see him in consultation.  He had colosnoscopy several years ago, and said there was no evidence of crohn's diasease, though I am suspicious.  Will continue with his meds.  He is stable, full code, and will be admitted to [facility] service.   Other plans as per orders. Code Status: FULL Haskel Khan, MD. FACP [facility] Pager [phone] 7pm to 7am.  03/07/2015, 8:26 PM

## 2015-03-08 ENCOUNTER — Encounter (HOSPITAL_COMMUNITY): Payer: Self-pay | Admitting: *Deleted

## 2015-03-08 DIAGNOSIS — J471 Bronchiectasis with (acute) exacerbation: Secondary | ICD-10-CM

## 2015-03-08 DIAGNOSIS — N179 Acute kidney failure, unspecified: Secondary | ICD-10-CM

## 2015-03-08 DIAGNOSIS — F17200 Nicotine dependence, unspecified, uncomplicated: Secondary | ICD-10-CM

## 2015-03-08 DIAGNOSIS — F411 Generalized anxiety disorder: Secondary | ICD-10-CM

## 2015-03-08 LAB — CBC
HEMATOCRIT: 37.7 % — AB (ref 39.0–52.0)
Hemoglobin: 12.9 g/dL — ABNORMAL LOW (ref 13.0–17.0)
MCH: 33 pg (ref 26.0–34.0)
MCHC: 34.2 g/dL (ref 30.0–36.0)
MCV: 96.4 fL (ref 78.0–100.0)
PLATELETS: 165 10*3/uL (ref 150–400)
RBC: 3.91 MIL/uL — ABNORMAL LOW (ref 4.22–5.81)
RDW: 13.9 % (ref 11.5–15.5)
WBC: 13.8 10*3/uL — AB (ref 4.0–10.5)

## 2015-03-08 LAB — BASIC METABOLIC PANEL
Anion gap: 8 (ref 5–15)
BUN: 5 mg/dL — AB (ref 6–20)
CO2: 23 mmol/L (ref 22–32)
CREATININE: 1.18 mg/dL (ref 0.61–1.24)
Calcium: 7.6 mg/dL — ABNORMAL LOW (ref 8.9–10.3)
Chloride: 101 mmol/L (ref 101–111)
Glucose, Bld: 126 mg/dL — ABNORMAL HIGH (ref 65–99)
POTASSIUM: 3.7 mmol/L (ref 3.5–5.1)
SODIUM: 132 mmol/L — AB (ref 135–145)

## 2015-03-08 LAB — OCCULT BLOOD, POC DEVICE: Fecal Occult Bld: NEGATIVE

## 2015-03-08 MED ORDER — SODIUM CHLORIDE 0.9 % IV SOLN: INTRAVENOUS | Status: DC

## 2015-03-08 MED ORDER — CETYLPYRIDINIUM CHLORIDE 0.05 % MT LIQD
7.0000 mL | Freq: Two times a day (BID) | OROMUCOSAL | Status: DC
  Administered 2015-03-08 – 2015-03-12 (×8): 7 mL via OROMUCOSAL

## 2015-03-08 MED ORDER — BUDESONIDE 0.25 MG/2ML IN SUSP
0.2500 mg | Freq: Two times a day (BID) | RESPIRATORY_TRACT | Status: DC
  Administered 2015-03-08 – 2015-03-12 (×7): 0.25 mg via RESPIRATORY_TRACT
  Filled 2015-03-08 (×7): qty 2

## 2015-03-08 NOTE — Progress Notes (Signed)
[facility] PROGRESS NOTE  Fernando [name] [id number] DOB: [birth year] DOA: 03/07/2015 PCP: Collene Mares, PA-C  Assessment/Plan: 1-Perirectal abscess/abd pain: -continue current abx's therapy -continue supportive care and CLD -Surgery on board, will follow rec's  2-bronchitis/bronchiectasis with exacerbation -will treat with pulmicort and PRN nebulizers -Flutter valve ordered -current antibiotics will cover lung disease as well -if wheezing worsens might require some steroids -will follow clinical response  3-HTN: -continue current antihypertensive regimen   4-AKI: pre-renal in nature and secondary to dehydration -will continue IVF's -follow trend  5-tobacco abuse: -Cessation counseling provided  -nicotine patch decline  6-anxiety: continue xanax PRN   Code Status: Full Family Communication: no family at bedside Disposition Plan: remains inpatient, continue supportive care, stabilizes breathing and follow surgery rec's.   Consultants:  General surgery   Procedures:  See below for x-ray reports   Antibiotics:  Zosyn 2/26  HPI/Subjective: Afebrile, denies CP, nausea and vomiting. Complaining of pain on his rectal area and also feeling SOB, wheezing and with some congestion.  Objective: Filed Vitals:   03/08/15 0355 03/08/15 1424  BP: 128/85 118/80  Pulse: 88 79  Temp: 99.1 F (37.3 C)   Resp: 16     Intake/Output Summary (Last 24 hours) at 03/08/15 1609 Last data filed at 03/08/15 1249  Gross per 24 hour  Intake      0 ml  Output   1800 ml  Net  -1800 ml   Filed Weights   03/07/15 1555 03/07/15 2321  Weight: 111.131 kg (245 lb) 113.309 kg (249 lb 12.8 oz)    Exam:   General:  Afebrile currently, complaining of SOB and congestion. Denies CP, nausea, vomiting. Rectal pain is bothering him a lot and endorses decrease appetite   Cardiovascular: S1 and S2, no rubs or gallops, no JVD  Respiratory: good air movement, no crackles;  positive rhonchi and exp wheezing.  Abdomen: soft, positive BS, vague diffuse tenderness   Musculoskeletal: FROM, no cyanosis    Data Reviewed: Basic Metabolic Panel:  Recent Labs Lab 03/07/15 1619 03/08/15 0906  NA 140 132*  K 3.7 3.7  CL 108 101  CO2 25 23  GLUCOSE 109* 126*  BUN 12 5*  CREATININE 1.25* 1.18  CALCIUM 8.3* 7.6*   Liver Function Tests:  Recent Labs Lab 03/07/15 1619  AST 21  ALT 20  ALKPHOS 89  BILITOT 0.2*  PROT 7.2  ALBUMIN 3.5   CBC:  Recent Labs Lab 03/07/15 1619 03/08/15 0906  WBC 12.3* 13.8*  NEUTROABS 8.7*  --   HGB 13.8 12.9*  HCT 40.7 37.7*  MCV 96.0 96.4  PLT 237 165   BNP (last 3 results)  Recent Labs  11/11/14 0003  BNP 33.0   CBG: No results for input(s): GLUCAP in the last 168 hours.  Recent Results (from the past 240 hour(s))  Blood culture (routine x 2)     Status: None (Preliminary result)   Collection Time: 03/07/15  7:52 PM  Result Value Ref Range Status   Specimen Description LEFT ANTECUBITAL  Final   Special Requests BOTTLES DRAWN AEROBIC AND ANAEROBIC 6CC EACH  Final   Culture NO GROWTH < 24 HOURS  Final   Report Status PENDING  Incomplete  Blood culture (routine x 2)     Status: None (Preliminary result)   Collection Time: 03/07/15  8:03 PM  Result Value Ref Range Status   Specimen Description BLOOD LEFT HAND  Final   Special Requests BOTTLES DRAWN AEROBIC AND ANAEROBIC 4CC  EACH  Final   Culture NO GROWTH < 24 HOURS  Final   Report Status PENDING  Incomplete     Studies: Dg Chest 2 View  03/07/2015  CLINICAL DATA:  Patient with airspace opacities on recent CT. EXAM: CHEST  2 VIEW COMPARISON:  Chest radiograph 11/10/2014 FINDINGS: Anterior cervical spinal fusion hardware. Stable cardiomegaly and tortuosity of the thoracic aorta. Focal consolidation is demonstrated within the left lower lobe. Additionally consolidative opacities are demonstrated within the peripheral right mid lung. No pleural effusion  or pneumothorax. Mid thoracic spine degenerative changes. IMPRESSION: Right mid lung and left lower lobe consolidative opacities which are nonspecific however may represent infection in the appropriate clinical setting. Additional consideration of pulmonary mass is not excluded. Followup PA and lateral chest X-ray is recommended in 3-4 weeks following trial of antibiotic therapy to ensure resolution and exclude underlying malignancy. Electronically Signed   By: Lovey Newcomer M.D.   On: 03/07/2015 19:02   Ct Abdomen Pelvis W Contrast  03/07/2015  CLINICAL DATA:  55 year old presenting with a 3 week history of rectal pain. Personal history of perirectal abscess requiring surgical drainage. Surgical history also includes appendectomy and unspecified hernia repair. EXAM: CT ABDOMEN AND PELVIS WITH CONTRAST TECHNIQUE: Multidetector CT imaging of the abdomen and pelvis was performed using the standard protocol following bolus administration of intravenous contrast. CONTRAST:  192mL OMNIPAQUE IOHEXOL 300 MG/ML IV. Oral contrast was also administered. COMPARISON:  04/29/2013, 08/17/2009, 10/30/2006, 09/16/2006. FINDINGS: Lower chest: Mass or focal airspace consolidation involving the left lower lobe, incompletely imaged. Heart size upper normal. Hepatobiliary: Liver normal in size and appearance. Gallbladder normal in appearance without calcified gallstones. No biliary ductal dilation. Pancreas: Normal in appearance without evidence of mass, ductal dilation, or inflammation. Spleen: Normal in size and appearance. Adrenals/Urinary Tract: Normal appearing adrenal glands. Kidneys normal in size and appearance without focal parenchymal abnormality. No evidence of urinary tract calculi or obstruction. Normal-appearing urinary bladder. Stomach/Bowel: Normal-appearing stomach filled with food. Normal-appearing small bowel. Numerous small bowel loops lateral to the ascending colon, a new finding. Edema/inflammation surrounding the  distal rectum, extending into the perirectal and perianal tissues below the anal verge. Approximate 2.9 x 1.3 x 1.7 cm fluid collection in the perianal region. Remainder of the colon decompressed and unremarkable. Appendix surgically absent. Vascular/Lymphatic: Mild iliofemoral atherosclerosis. Patent visceral arteries. Normal-appearing portal venous and systemic venous systems. Scattered normal sized retroperitoneal lymph nodes. No pathologic lymphadenopathy. Reproductive: Mild median lobe prostate gland enlargement as noted previously. Normal seminal vesicles. Other: None. 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 Facet degenerative changes at L4-5 and L5-S1. No acute abnormality. IMPRESSION: 1. Perirectal and perianal inflammation with a small perianal abscess measured above. 2. Small bowel loops lateral to the ascending colon which may indicate an internal hernia. 3. Airspace consolidation versus mass involving the visualized left lower lobe, incompletely imaged on the current CT. 4. Stable mild median lobe prostate gland enlargement (likely BPH). Electronically Signed   By: Evangeline Dakin M.D.   On: 03/07/2015 18:02    Scheduled Meds: . antiseptic oral rinse  7 mL Mouth Rinse BID  . budesonide (PULMICORT) nebulizer solution  0.25 mg Nebulization BID  . enoxaparin (LOVENOX) injection  40 mg Subcutaneous Q24H  . hydrochlorothiazide  12.5 mg Oral Daily  . lisinopril  20 mg Oral Daily  . piperacillin-tazobactam (ZOSYN)  IV  3.375 g Intravenous Q8H   Continuous Infusions: .  sodium chloride 75 mL/hr (03/08/15 0830)  . dextrose 5 % and 0.9% NaCl 100 mL/hr at 03/07/15 2351     Time spent: 30 minutes    Barton Dubois  [facility] Pager [phone]. If 7PM-7AM, please contact night-coverage at [email or web address], password [facility] 03/08/2015, 4:09 PM  LOS: 1 day

## 2015-03-08 NOTE — Consult Note (Signed)
Reason for Consult: Perirectal abscess Referring Physician: Hospitalist  Fernando [name] is an 55 y.o. male.  HPI: Patient is a 55 year old white male who presented emergency room with a 3 week history of rectal pain. CT scan of the abdomen and pelvis was performed in the emergency room which reveals a subtle 2 cm perianal induration and possible abscess. Patient denies any drainage. No history of colonoscopy. Patient also complains of shortness of breath this morning. He is a smoker.  Past Medical History  Diagnosis Date  . Hypertension 12/30/2011  . Allergy   . Peri-rectal abscess     Past Surgical History  Procedure Laterality Date  . Cervical fusion    . Facial cosmetic surgery    . Knee arthroscopy    . Appendectomy    . Hernia repair    . Spine surgery    . Cystoscopy N/A 05/01/2013    Procedure: CYSTOSCOPY;  Surgeon: Marissa Nestle, MD;  Location: [facility];  Service: Urology;  Laterality: N/A;  . Incision and drainage abscess Right 05/01/2013    Procedure: INCISION AND DRAINAGE RIGHT SCROTAL ABSCESS;  Surgeon: Marissa Nestle, MD;  Location: [facility];  Service: Urology;  Laterality: Right;    Family History  Problem Relation Age of Onset  . Breast cancer Mother     Social History:  reports that he has been smoking Cigarettes.  He has a 4 pack-year smoking history. He does not have any smokeless tobacco history on file. He reports that he does not drink alcohol or use illicit drugs.  Allergies:  Allergies  Allergen Reactions  . Morphine And Related Other (See Comments)    hallucinations    Medications: I have reviewed the patient's current medications.  Results for orders placed or performed during the hospital encounter of 03/07/15 (from the past 48 hour(s))  CBC with Differential/Platelet     Status: Abnormal   Collection Time: 03/07/15  4:19 PM  Result Value Ref Range   WBC 12.3 (H) 4.0 - 10.5 K/uL   RBC 4.24 4.22 - 5.81 MIL/uL   Hemoglobin 13.8 13.0 - 17.0 g/dL    HCT 40.7 39.0 - 52.0 %   MCV 96.0 78.0 - 100.0 fL   MCH 32.5 26.0 - 34.0 pg   MCHC 33.9 30.0 - 36.0 g/dL   RDW 13.9 11.5 - 15.5 %   Platelets 237 150 - 400 K/uL   Neutrophils Relative % 71 %   Neutro Abs 8.7 (H) 1.7 - 7.7 K/uL   Lymphocytes Relative 15 %   Lymphs Abs 1.9 0.7 - 4.0 K/uL   Monocytes Relative 10 %   Monocytes Absolute 1.2 (H) 0.1 - 1.0 K/uL   Eosinophils Relative 4 %   Eosinophils Absolute 0.5 0.0 - 0.7 K/uL   Basophils Relative 0 %   Basophils Absolute 0.0 0.0 - 0.1 K/uL  Comprehensive metabolic panel     Status: Abnormal   Collection Time: 03/07/15  4:19 PM  Result Value Ref Range   Sodium 140 135 - 145 mmol/L   Potassium 3.7 3.5 - 5.1 mmol/L   Chloride 108 101 - 111 mmol/L   CO2 25 22 - 32 mmol/L   Glucose, Bld 109 (H) 65 - 99 mg/dL   BUN 12 6 - 20 mg/dL   Creatinine, Ser 1.25 (H) 0.61 - 1.24 mg/dL   Calcium 8.3 (L) 8.9 - 10.3 mg/dL   Total Protein 7.2 6.5 - 8.1 g/dL   Albumin 3.5 3.5 - 5.0 g/dL  AST 21 15 - 41 U/L   ALT 20 17 - 63 U/L   Alkaline Phosphatase 89 38 - 126 U/L   Total Bilirubin 0.2 (L) 0.3 - 1.2 mg/dL   GFR calc non Af Amer >60 >60 mL/min   GFR calc Af Amer >60 >60 mL/min    Comment: (NOTE) The eGFR has been calculated using the CKD EPI equation. This calculation has not been validated in all clinical situations. eGFR's persistently <60 mL/min signify possible Chronic Kidney Disease.    Anion gap 7 5 - 15  Urinalysis, Routine w reflex microscopic (not at [facility])     Status: None   Collection Time: 03/07/15  6:55 PM  Result Value Ref Range   Color, Urine YELLOW YELLOW   APPearance CLEAR CLEAR   Specific Gravity, Urine 1.015 1.005 - 1.030   pH 6.0 5.0 - 8.0   Glucose, UA NEGATIVE NEGATIVE mg/dL   Hgb urine dipstick NEGATIVE NEGATIVE   Bilirubin Urine NEGATIVE NEGATIVE   Ketones, ur NEGATIVE NEGATIVE mg/dL   Protein, ur NEGATIVE NEGATIVE mg/dL   Nitrite NEGATIVE NEGATIVE   Leukocytes, UA NEGATIVE NEGATIVE    Comment: MICROSCOPIC  NOT DONE ON URINES WITH NEGATIVE PROTEIN, BLOOD, LEUKOCYTES, NITRITE, OR GLUCOSE <1000 mg/dL.  Blood culture (routine x 2)     Status: None (Preliminary result)   Collection Time: 03/07/15  7:52 PM  Result Value Ref Range   Specimen Description LEFT ANTECUBITAL    Special Requests BOTTLES DRAWN AEROBIC AND ANAEROBIC 6CC EACH    Culture PENDING    Report Status PENDING   Blood culture (routine x 2)     Status: None (Preliminary result)   Collection Time: 03/07/15  8:03 PM  Result Value Ref Range   Specimen Description BLOOD LEFT HAND    Special Requests BOTTLES DRAWN AEROBIC AND ANAEROBIC 4CC EACH    Culture PENDING    Report Status PENDING     Dg Chest 2 View  03/07/2015  CLINICAL DATA:  Patient with airspace opacities on recent CT. EXAM: CHEST  2 VIEW COMPARISON:  Chest radiograph 11/10/2014 FINDINGS: Anterior cervical spinal fusion hardware. Stable cardiomegaly and tortuosity of the thoracic aorta. Focal consolidation is demonstrated within the left lower lobe. Additionally consolidative opacities are demonstrated within the peripheral right mid lung. No pleural effusion or pneumothorax. Mid thoracic spine degenerative changes. IMPRESSION: Right mid lung and left lower lobe consolidative opacities which are nonspecific however may represent infection in the appropriate clinical setting. Additional consideration of pulmonary mass is not excluded. Followup PA and lateral chest X-ray is recommended in 3-4 weeks following trial of antibiotic therapy to ensure resolution and exclude underlying malignancy. Electronically Signed   By: Lovey Newcomer M.D.   On: 03/07/2015 19:02   Ct Abdomen Pelvis W Contrast  03/07/2015  CLINICAL DATA:  55 year old presenting with a 3 week history of rectal pain. Personal history of perirectal abscess requiring surgical drainage. Surgical history also includes appendectomy and unspecified hernia repair. EXAM: CT ABDOMEN AND PELVIS WITH CONTRAST TECHNIQUE: Multidetector  CT imaging of the abdomen and pelvis was performed using the standard protocol following bolus administration of intravenous contrast. CONTRAST:  179m OMNIPAQUE IOHEXOL 300 MG/ML IV. Oral contrast was also administered. COMPARISON:  04/29/2013, 08/17/2009, 10/30/2006, 09/16/2006. FINDINGS: Lower chest: Mass or focal airspace consolidation involving the left lower lobe, incompletely imaged. Heart size upper normal. Hepatobiliary: Liver normal in size and appearance. Gallbladder normal in appearance without calcified gallstones. No biliary ductal dilation. Pancreas: Normal in appearance  without evidence of mass, ductal dilation, or inflammation. Spleen: Normal in size and appearance. Adrenals/Urinary Tract: Normal appearing adrenal glands. Kidneys normal in size and appearance without focal parenchymal abnormality. No evidence of urinary tract calculi or obstruction. Normal-appearing urinary bladder. Stomach/Bowel: Normal-appearing stomach filled with food. Normal-appearing small bowel. Numerous small bowel loops lateral to the ascending colon, a new finding. Edema/inflammation surrounding the distal rectum, extending into the perirectal and perianal tissues below the anal verge. Approximate 2.9 x 1.3 x 1.7 cm fluid collection in the perianal region. Remainder of the colon decompressed and unremarkable. Appendix surgically absent. Vascular/Lymphatic: Mild iliofemoral atherosclerosis. Patent visceral arteries. Normal-appearing portal venous and systemic venous systems. Scattered normal sized retroperitoneal lymph nodes. No pathologic lymphadenopathy. Reproductive: Mild median lobe prostate gland enlargement as noted previously. Normal seminal vesicles. Other: None. 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 Facet degenerative changes at L4-5 and L5-S1. No acute abnormality. IMPRESSION: 1. Perirectal and  perianal inflammation with a small perianal abscess measured above. 2. Small bowel loops lateral to the ascending colon which may indicate an internal hernia. 3. Airspace consolidation versus mass involving the visualized left lower lobe, incompletely imaged on the current CT. 4. Stable mild median lobe prostate gland enlargement (likely BPH). Electronically Signed   By: Evangeline Dakin M.D.   On: 03/07/2015 18:02    ROS: See chart Blood pressure 128/85, pulse 88, temperature 99.1 F (37.3 C), temperature source Oral, resp. rate 16, height '6\' 2"'$  (1.88 m), weight 113.309 kg (249 lb 12.8 oz), SpO2 95 %. Physical Exam: Pleasant white male in mild discomfort Rectal examination was limited secondary to pain. He does have a developing indurated area along the posterior aspect of the anus. Examination is limited due to pain.  Assessment/Plan: Impression: Perianal abscess, upper respiratory infection with a history of smoking. Plan: Agree with IV Zosyn. Given his pulmonary status, I can't put him under general anesthesia until this is improved. This may end up clearing on its own. Will follow with you.  Prachi Oftedahl A 03/08/2015, 9:37 AM

## 2015-03-09 DIAGNOSIS — R197 Diarrhea, unspecified: Secondary | ICD-10-CM

## 2015-03-09 LAB — BASIC METABOLIC PANEL
ANION GAP: 7 (ref 5–15)
BUN: 6 mg/dL (ref 6–20)
CALCIUM: 8.5 mg/dL — AB (ref 8.9–10.3)
CHLORIDE: 98 mmol/L — AB (ref 101–111)
CO2: 31 mmol/L (ref 22–32)
Creatinine, Ser: 1.16 mg/dL (ref 0.61–1.24)
GFR calc non Af Amer: >60 mL/min (ref >60)
Glucose, Bld: 116 mg/dL — ABNORMAL HIGH (ref 65–99)
Potassium: 3.8 mmol/L (ref 3.5–5.1)
SODIUM: 136 mmol/L (ref 135–145)

## 2015-03-09 LAB — CBC
HCT: 40.9 % (ref 39.0–52.0)
HEMOGLOBIN: 14.1 g/dL (ref 13.0–17.0)
MCH: 33.3 pg (ref 26.0–34.0)
MCHC: 34.5 g/dL (ref 30.0–36.0)
MCV: 96.7 fL (ref 78.0–100.0)
PLATELETS: 246 10*3/uL (ref 150–400)
RBC: 4.23 MIL/uL (ref 4.22–5.81)
RDW: 13.6 % (ref 11.5–15.5)
WBC: 14.8 10*3/uL — AB (ref 4.0–10.5)

## 2015-03-09 LAB — C DIFFICILE QUICK SCREEN W PCR REFLEX
C DIFFICILE (CDIFF) INTERP: NEGATIVE
C DIFFICLE (CDIFF) ANTIGEN: NEGATIVE
C Diff toxin: NEGATIVE

## 2015-03-09 MED ORDER — ACETAMINOPHEN 325 MG PO TABS: 650.0000 mg | ORAL_TABLET | Freq: Four times a day (QID) | ORAL | Status: DC | PRN

## 2015-03-09 MED ORDER — IBUPROFEN 400 MG PO TABS
400.0000 mg | ORAL_TABLET | Freq: Once | ORAL | Status: AC
Start: 2015-03-09 — End: 2015-03-09
  Administered 2015-03-09: 400 mg via ORAL
  Filled 2015-03-09: qty 1

## 2015-03-09 MED ORDER — SACCHAROMYCES BOULARDII 250 MG PO CAPS
250.0000 mg | ORAL_CAPSULE | Freq: Two times a day (BID) | ORAL | Status: DC
  Administered 2015-03-09 – 2015-03-12 (×7): 250 mg via ORAL
  Filled 2015-03-09 (×7): qty 1

## 2015-03-09 NOTE — Progress Notes (Signed)
Subjective: Patient still with rectal pain, but somewhat better. He states his breathing is somewhat better.  Objective: Vital signs in last 24 hours: Temp:  [98.6 F (37 C)-99.3 F (37.4 C)] 98.6 F (37 C) (02/28 0536) Pulse Rate:  [79-86] 84 (02/28 0536) Resp:  [18-20] 20 (02/28 0536) BP: (101-118)/(65-80) 101/69 mmHg (02/28 0536) SpO2:  [93 %-98 %] 93 % (02/28 0848) Last BM Date: 03/08/15  Intake/Output from previous day: 02/27 0701 - 02/28 0700 In: 1777.5 [P.O.:240; I.V.:1537.5] Out: 2250 [Urine:2250] Intake/Output this shift: Total I/O In: -  Out: 400 [Urine:400]  General appearance: alert, cooperative and no distress Pelvic: Now with drainage from perirectal abscess posteriorly. Less induration noted. Tender to touch.  Lab Results:   Recent Labs  03/08/15 0906 03/09/15 0545  WBC 13.8* 14.8*  HGB 12.9* 14.1  HCT 37.7* 40.9  PLT 165 246   BMET  Recent Labs  03/08/15 0906 03/09/15 0545  NA 132* 136  K 3.7 3.8  CL 101 98*  CO2 23 31  GLUCOSE 126* 116*  BUN 5* 6  CREATININE 1.18 1.16  CALCIUM 7.6* 8.5*   PT/INR No results for input(s): LABPROT, INR in the last 72 hours.  Studies/Results: Dg Chest 2 View  03/07/2015  CLINICAL DATA:  Patient with airspace opacities on recent CT. EXAM: CHEST  2 VIEW COMPARISON:  Chest radiograph 11/10/2014 FINDINGS: Anterior cervical spinal fusion hardware. Stable cardiomegaly and tortuosity of the thoracic aorta. Focal consolidation is demonstrated within the left lower lobe. Additionally consolidative opacities are demonstrated within the peripheral right mid lung. No pleural effusion or pneumothorax. Mid thoracic spine degenerative changes. IMPRESSION: Right mid lung and left lower lobe consolidative opacities which are nonspecific however may represent infection in the appropriate clinical setting. Additional consideration of pulmonary mass is not excluded. Followup PA and lateral chest X-ray is recommended in 3-4 weeks  following trial of antibiotic therapy to ensure resolution and exclude underlying malignancy. Electronically Signed   By: Lovey Newcomer M.D.   On: 03/07/2015 19:02   Ct Abdomen Pelvis W Contrast  03/07/2015  CLINICAL DATA:  55 year old presenting with a 3 week history of rectal pain. Personal history of perirectal abscess requiring surgical drainage. Surgical history also includes appendectomy and unspecified hernia repair. EXAM: CT ABDOMEN AND PELVIS WITH CONTRAST TECHNIQUE: Multidetector CT imaging of the abdomen and pelvis was performed using the standard protocol following bolus administration of intravenous contrast. CONTRAST:  120mL OMNIPAQUE IOHEXOL 300 MG/ML IV. Oral contrast was also administered. COMPARISON:  04/29/2013, 08/17/2009, 10/30/2006, 09/16/2006. FINDINGS: Lower chest: Mass or focal airspace consolidation involving the left lower lobe, incompletely imaged. Heart size upper normal. Hepatobiliary: Liver normal in size and appearance. Gallbladder normal in appearance without calcified gallstones. No biliary ductal dilation. Pancreas: Normal in appearance without evidence of mass, ductal dilation, or inflammation. Spleen: Normal in size and appearance. Adrenals/Urinary Tract: Normal appearing adrenal glands. Kidneys normal in size and appearance without focal parenchymal abnormality. No evidence of urinary tract calculi or obstruction. Normal-appearing urinary bladder. Stomach/Bowel: Normal-appearing stomach filled with food. Normal-appearing small bowel. Numerous small bowel loops lateral to the ascending colon, a new finding. Edema/inflammation surrounding the distal rectum, extending into the perirectal and perianal tissues below the anal verge. Approximate 2.9 x 1.3 x 1.7 cm fluid collection in the perianal region. Remainder of the colon decompressed and unremarkable. Appendix surgically absent. Vascular/Lymphatic: Mild iliofemoral atherosclerosis. Patent visceral arteries. Normal-appearing  portal venous and systemic venous systems. Scattered normal sized retroperitoneal lymph nodes. No pathologic lymphadenopathy.  Reproductive: Mild median lobe prostate gland enlargement as noted previously. Normal seminal vesicles. Other: None. 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 Facet degenerative changes at L4-5 and L5-S1. No acute abnormality. IMPRESSION: 1. Perirectal and perianal inflammation with a small perianal abscess measured above. 2. Small bowel loops lateral to the ascending colon which may indicate an internal hernia. 3. Airspace consolidation versus mass involving the visualized left lower lobe, incompletely imaged on the current CT. 4. Stable mild median lobe prostate gland enlargement (likely BPH). Electronically Signed   By: Evangeline Dakin M.D.   On: 03/07/2015 18:02    Anti-infectives: Anti-infectives    Start     Dose/Rate Route Frequency Ordered Stop   03/07/15 2330  piperacillin-tazobactam (ZOSYN) IVPB 3.375 g     3.375 g 12.5 mL/hr over 240 Minutes Intravenous Every 8 hours 03/07/15 2329     03/07/15 2315  piperacillin-tazobactam (ZOSYN) IVPB 3.375 g  Status:  Discontinued     3.375 g 100 mL/hr over 30 Minutes Intravenous 3 times per day 03/07/15 2313 03/07/15 2329   03/07/15 1930  vancomycin (VANCOCIN) IVPB 1000 mg/200 mL premix     1,000 mg 200 mL/hr over 60 Minutes Intravenous  Once 03/07/15 1920 03/07/15 2142   03/07/15 1830  piperacillin-tazobactam (ZOSYN) IVPB 3.375 g  Status:  Discontinued     3.375 g 12.5 mL/hr over 240 Minutes Intravenous  Once 03/07/15 1818 03/07/15 1818   03/07/15 1830  piperacillin-tazobactam (ZOSYN) IVPB 3.375 g     3.375 g 12.5 mL/hr over 240 Minutes Intravenous  Once 03/07/15 1819 03/07/15 2142      Assessment/Plan: Impression: Perianal abscess, now with drainage. No need for formal incision and drainage. Plan:  Continue IV antibiotics as prescribed.  LOS: 2 days    Aylani Spurlock A 03/09/2015

## 2015-03-09 NOTE — Progress Notes (Signed)
[facility] PROGRESS NOTE  Fernando [name] [id number] DOB: 1960/09/07 DOA: 03/07/2015 PCP: Collene Mares, PA-C  Assessment/Plan: 1-Perirectal abscess/abd pain: -continue current abx's therapy -continue supportive care and clear liquid diet  -Surgery on board, will follow rec's -abscess spontaneously draining now. -will repeat CT/abd and pelvis in am.  2-bronchitis/bronchiectasis with exacerbation -will continue treatment with pulmicort and PRN nebulizers -continue Flutter valve  -current antibiotics will cover lung disease as well -will follow clinical response -breathing easier and currently w/o wheezing   3-HTN: -continue current antihypertensive regimen  -BP is well controlled   4-AKI: pre-renal in nature and secondary to dehydration -will continue IVF's; but will adjust rate -renal function down to normal again -follow trend  5-tobacco abuse: -Cessation counseling provided  -nicotine patch offered and declined  6-anxiety: continue xanax PRN  7-Diarrhea: -most likely from abx's -will start florastor BID -will check C. diff  Code Status: Full Family Communication: no family at bedside Disposition Plan: remains inpatient, continue supportive care, continue IV abx's; follow surgery rec's.   Consultants:  General surgery   Procedures:  See below for x-ray reports   Antibiotics:  Zosyn 2/26  HPI/Subjective: Afebrile, denies CP, nausea and vomiting. Complaining of pain on his rectal area and diarrhea. Patient reports breathing is better.   Objective: Filed Vitals:   03/09/15 0536 03/09/15 1123  BP: 101/69 119/73  Pulse: 84   Temp: 98.6 F (37 C)   Resp: 20     Intake/Output Summary (Last 24 hours) at 03/09/15 1321 Last data filed at 03/09/15 0856  Gross per 24 hour  Intake 1777.5 ml  Output   1950 ml  Net -172.5 ml   Filed Weights   03/07/15 1555 03/07/15 2321  Weight: 111.131 kg (245 lb) 113.309 kg (249 lb 12.8 oz)     Exam:   General:  Afebrile currently, breathing easier and w/o CP. Patient complaining of diarrhea and and with pain in his rectal area. Denies nausea, vomiting.  Cardiovascular: S1 and S2, no rubs or gallops, no JVD  Respiratory: good air movement, no crackles; positive rhonchi and exp wheezing.  Abdomen: soft, positive BS, vague diffuse tenderness; no distension    Musculoskeletal: FROM, no cyanosis, no clubbing, no edema   Data Reviewed: Basic Metabolic Panel:  Recent Labs Lab 03/07/15 1619 03/08/15 0906 03/09/15 0545  NA 140 132* 136  K 3.7 3.7 3.8  CL 108 101 98*  CO2 25 23 31   GLUCOSE 109* 126* 116*  BUN 12 5* 6  CREATININE 1.25* 1.18 1.16  CALCIUM 8.3* 7.6* 8.5*   Liver Function Tests:  Recent Labs Lab 03/07/15 1619  AST 21  ALT 20  ALKPHOS 89  BILITOT 0.2*  PROT 7.2  ALBUMIN 3.5   CBC:  Recent Labs Lab 03/07/15 1619 03/08/15 0906 03/09/15 0545  WBC 12.3* 13.8* 14.8*  NEUTROABS 8.7*  --   --   HGB 13.8 12.9* 14.1  HCT 40.7 37.7* 40.9  MCV 96.0 96.4 96.7  PLT 237 165 246   BNP (last 3 results)  Recent Labs  11/11/14 0003  BNP 33.0   CBG: No results for input(s): GLUCAP in the last 168 hours.  Recent Results (from the past 240 hour(s))  Blood culture (routine x 2)     Status: None (Preliminary result)   Collection Time: 03/07/15  7:52 PM  Result Value Ref Range Status   Specimen Description LEFT ANTECUBITAL  Final   Special Requests BOTTLES DRAWN AEROBIC AND ANAEROBIC [place]  Final  Culture NO GROWTH 2 DAYS  Final   Report Status PENDING  Incomplete  Blood culture (routine x 2)     Status: None (Preliminary result)   Collection Time: 03/07/15  8:03 PM  Result Value Ref Range Status   Specimen Description BLOOD LEFT HAND  Final   Special Requests BOTTLES DRAWN AEROBIC AND ANAEROBIC 4CC EACH  Final   Culture NO GROWTH 2 DAYS  Final   Report Status PENDING  Incomplete     Studies: Dg Chest 2 View  03/07/2015  CLINICAL  DATA:  Patient with airspace opacities on recent CT. EXAM: CHEST  2 VIEW COMPARISON:  Chest radiograph 11/10/2014 FINDINGS: Anterior cervical spinal fusion hardware. Stable cardiomegaly and tortuosity of the thoracic aorta. Focal consolidation is demonstrated within the left lower lobe. Additionally consolidative opacities are demonstrated within the peripheral right mid lung. No pleural effusion or pneumothorax. Mid thoracic spine degenerative changes. IMPRESSION: Right mid lung and left lower lobe consolidative opacities which are nonspecific however may represent infection in the appropriate clinical setting. Additional consideration of pulmonary mass is not excluded. Followup PA and lateral chest X-ray is recommended in 3-4 weeks following trial of antibiotic therapy to ensure resolution and exclude underlying malignancy. Electronically Signed   By: Lovey Newcomer M.D.   On: 03/07/2015 19:02   Ct Abdomen Pelvis W Contrast  03/07/2015  CLINICAL DATA:  55 year old presenting with a 3 week history of rectal pain. Personal history of perirectal abscess requiring surgical drainage. Surgical history also includes appendectomy and unspecified hernia repair. EXAM: CT ABDOMEN AND PELVIS WITH CONTRAST TECHNIQUE: Multidetector CT imaging of the abdomen and pelvis was performed using the standard protocol following bolus administration of intravenous contrast. CONTRAST:  185mL OMNIPAQUE IOHEXOL 300 MG/ML IV. Oral contrast was also administered. COMPARISON:  04/29/2013, 08/17/2009, 10/30/2006, 09/16/2006. FINDINGS: Lower chest: Mass or focal airspace consolidation involving the left lower lobe, incompletely imaged. Heart size upper normal. Hepatobiliary: Liver normal in size and appearance. Gallbladder normal in appearance without calcified gallstones. No biliary ductal dilation. Pancreas: Normal in appearance without evidence of mass, ductal dilation, or inflammation. Spleen: Normal in size and appearance. Adrenals/Urinary  Tract: Normal appearing adrenal glands. Kidneys normal in size and appearance without focal parenchymal abnormality. No evidence of urinary tract calculi or obstruction. Normal-appearing urinary bladder. Stomach/Bowel: Normal-appearing stomach filled with food. Normal-appearing small bowel. Numerous small bowel loops lateral to the ascending colon, a new finding. Edema/inflammation surrounding the distal rectum, extending into the perirectal and perianal tissues below the anal verge. Approximate 2.9 x 1.3 x 1.7 cm fluid collection in the perianal region. Remainder of the colon decompressed and unremarkable. Appendix surgically absent. Vascular/Lymphatic: Mild iliofemoral atherosclerosis. Patent visceral arteries. Normal-appearing portal venous and systemic venous systems. Scattered normal sized retroperitoneal lymph nodes. No pathologic lymphadenopathy. Reproductive: Mild median lobe prostate gland enlargement as noted previously. Normal seminal vesicles. Other: None. 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 Facet degenerative changes at L4-5 and L5-S1. No acute abnormality. IMPRESSION: 1. Perirectal and perianal inflammation with a small perianal abscess measured above. 2. Small bowel loops lateral to the ascending colon which may indicate an internal hernia. 3. Airspace consolidation versus mass involving the visualized left lower lobe, incompletely imaged on the current CT. 4. Stable mild median lobe prostate gland enlargement (likely BPH). Electronically Signed   By: Evangeline Dakin M.D.   On: 03/07/2015 18:02    Scheduled Meds: . antiseptic oral  rinse  7 mL Mouth Rinse BID  . budesonide (PULMICORT) nebulizer solution  0.25 mg Nebulization BID  . enoxaparin (LOVENOX) injection  40 mg Subcutaneous Q24H  . hydrochlorothiazide  12.5 mg Oral Daily  . lisinopril  20 mg Oral Daily  .  piperacillin-tazobactam (ZOSYN)  IV  3.375 g Intravenous Q8H  . saccharomyces boulardii  250 mg Oral BID   Continuous Infusions: . sodium chloride 75 mL/hr at 03/09/15 0500  . dextrose 5 % and 0.9% NaCl 100 mL/hr at 03/09/15 [id number]     Time spent: 30 minutes    Barton Dubois  [facility] Pager [phone]. If 7PM-7AM, please contact night-coverage at [email or web address], password [facility] 03/09/2015, 1:21 PM  LOS: 2 days

## 2015-03-09 NOTE — Care Management Note (Signed)
Case Management Note  Patient Details  Name: Fernando [name] MRN: [id number] Date of Birth: Jul 15, 1960  Subjective/Objective:     Independent from home, uninsured although he works for [employer]ing [employer]. May need home health and assistance with meds at discharge.  Action/Plan:  Home with self care at this time.  Expected Discharge Date:  03/11/15               Expected Discharge Plan:  [place]  In-House Referral:     Discharge planning Services  CM Consult  Post Acute Care Choice:    Choice offered to:     DME Arranged:    DME Agency:     HH Arranged:    HH Agency:     Status of Service:  In process, will continue to follow  Medicare Important Message Given:    Date Medicare IM Given:    Medicare IM give by:    Date Additional Medicare IM Given:    Additional Medicare Important Message give by:     If discussed at [place] of Stay Meetings, dates discussed:    Additional Comments:  Alvie Heidelberg, RN 03/09/2015, 5:22 PM

## 2015-03-10 ENCOUNTER — Inpatient Hospital Stay (HOSPITAL_COMMUNITY): Payer: Self-pay

## 2015-03-10 DIAGNOSIS — R938 Abnormal findings on diagnostic imaging of other specified body structures: Secondary | ICD-10-CM

## 2015-03-10 LAB — BASIC METABOLIC PANEL
Anion gap: 8 (ref 5–15)
BUN: 9 mg/dL (ref 6–20)
CO2: 28 mmol/L (ref 22–32)
Calcium: 8.4 mg/dL — ABNORMAL LOW (ref 8.9–10.3)
Chloride: 100 mmol/L — ABNORMAL LOW (ref 101–111)
Creatinine, Ser: 1.11 mg/dL (ref 0.61–1.24)
GFR calc Af Amer: >60 mL/min (ref >60)
GLUCOSE: 106 mg/dL — AB (ref 65–99)
POTASSIUM: 3.3 mmol/L — AB (ref 3.5–5.1)
Sodium: 136 mmol/L (ref 135–145)

## 2015-03-10 LAB — CBC
HCT: 41.1 % (ref 39.0–52.0)
Hemoglobin: 14.3 g/dL (ref 13.0–17.0)
MCH: 33.1 pg (ref 26.0–34.0)
MCHC: 34.8 g/dL (ref 30.0–36.0)
MCV: 95.1 fL (ref 78.0–100.0)
PLATELETS: 240 10*3/uL (ref 150–400)
RBC: 4.32 MIL/uL (ref 4.22–5.81)
RDW: 13.3 % (ref 11.5–15.5)
WBC: 10.5 10*3/uL (ref 4.0–10.5)

## 2015-03-10 MED ORDER — OXYCODONE-ACETAMINOPHEN 5-325 MG PO TABS
1.0000 | ORAL_TABLET | ORAL | Status: DC | PRN
  Administered 2015-03-10: 1 via ORAL
  Administered 2015-03-12: 2 via ORAL
  Filled 2015-03-10 (×2): qty 2
  Filled 2015-03-10: qty 1

## 2015-03-10 MED ORDER — NICOTINE 21 MG/24HR TD PT24
21.0000 mg | MEDICATED_PATCH | Freq: Every day | TRANSDERMAL | Status: DC
  Administered 2015-03-10 – 2015-03-11 (×2): 21 mg via TRANSDERMAL
  Filled 2015-03-10 (×2): qty 1

## 2015-03-10 MED ORDER — IOHEXOL 300 MG/ML  SOLN
100.0000 mL | Freq: Once | INTRAMUSCULAR | Status: AC | PRN
  Administered 2015-03-10: 100 mL via INTRAVENOUS

## 2015-03-10 MED ORDER — DIATRIZOATE MEGLUMINE & SODIUM 66-10 % PO SOLN
ORAL | Status: AC
  Filled 2015-03-10: qty 30

## 2015-03-10 MED ORDER — PROMETHAZINE HCL 12.5 MG PO TABS
25.0000 mg | ORAL_TABLET | Freq: Four times a day (QID) | ORAL | Status: DC | PRN
  Administered 2015-03-10 – 2015-03-12 (×2): 25 mg via ORAL
  Filled 2015-03-10 (×3): qty 2

## 2015-03-10 MED ORDER — POTASSIUM CHLORIDE CRYS ER 20 MEQ PO TBCR
40.0000 meq | EXTENDED_RELEASE_TABLET | Freq: Once | ORAL | Status: AC
  Administered 2015-03-10: 40 meq via ORAL
  Filled 2015-03-10: qty 2

## 2015-03-10 MED ORDER — HYDROMORPHONE HCL 1 MG/ML IJ SOLN
1.0000 mg | INTRAMUSCULAR | Status: DC | PRN
  Administered 2015-03-10 – 2015-03-12 (×9): 1 mg via INTRAVENOUS
  Filled 2015-03-10 (×9): qty 1

## 2015-03-10 NOTE — Progress Notes (Signed)
Patient states that when he takes po pain medication they make him have nausea.  States that he has to take Phenergan with them.  Dr Tana Coast notified via text paged.  Dr. Tana Coast called and gave phone order for patient to receive Phenergan 25 mg po every 6 hours as needed for nausea.

## 2015-03-10 NOTE — Progress Notes (Signed)
Subjective: Still with rectal pain, but seems to be easing.  Objective: Vital signs in last 24 hours: Temp:  [98 F (36.7 C)-98.2 F (36.8 C)] 98 F (36.7 C) (03/01 0535) Pulse Rate:  [72-88] 88 (03/01 1014) Resp:  [18-20] 20 (03/01 0535) BP: (92-118)/(65-87) 118/87 mmHg (03/01 1014) SpO2:  [95 %-100 %] 100 % (03/01 1014) Last BM Date: 03/08/15  Intake/Output from previous day: 02/28 0701 - 03/01 0700 In: 240 [P.O.:240] Out: 900 [Urine:900] Intake/Output this shift: Total I/O In: 240 [P.O.:240] Out: -   General appearance: alert, cooperative and no distress Pelvic: Less perianal drainage and induration are noted. Slightly less tenderness noted to palpation.  Lab Results:   Recent Labs  03/09/15 0545 03/10/15 0613  WBC 14.8* 10.5  HGB 14.1 14.3  HCT 40.9 41.1  PLT 246 240   BMET  Recent Labs  03/09/15 0545 03/10/15 0613  NA 136 136  K 3.8 3.3*  CL 98* 100*  CO2 31 28  GLUCOSE 116* 106*  BUN 6 9  CREATININE 1.16 1.11  CALCIUM 8.5* 8.4*   PT/INR No results for input(s): LABPROT, INR in the last 72 hours.  Studies/Results: Ct Chest W Contrast  03/10/2015  CLINICAL DATA:  Perirectal access, rectal pain, possible mass or focal airspace disease left lower lobe. EXAM: CT CHEST, ABDOMEN, AND PELVIS WITH CONTRAST TECHNIQUE: Multidetector CT imaging of the chest, abdomen and pelvis was performed following the standard protocol during bolus administration of intravenous contrast. CONTRAST:  122mL OMNIPAQUE IOHEXOL 300 MG/ML  SOLN COMPARISON:  CT scan abdomen and pelvis 03/07/2015 and CT of the chest and abdomen 08/17/2009. FINDINGS: CT CHEST Sagittal images of the spine shows degenerative changes thoracic spine. Sagittal view of the sternum is unremarkable. Images of the thoracic inlet are unremarkable. Central airways are patent. No mediastinal hematoma or adenopathy. Heart size within normal limits. No pericardial effusion. There is no hilar adenopathy. Central  thoracic aorta and pulmonary artery is unremarkable. Mild centrilobular emphysematous changes are noted bilateral upper lobe. As noted on recent CT scan of the abdomen again noted multilobular somewhat clustered nodular consolidation in left lower lobe centrally posteriorly infrahilar region. This is best seen in axial image 33 measures at least 3.3 by 2.8 cm. This is confirmed on coronal image 81. This is highly suspicious for malignancy. Further correlation with PET scan, bronchoscopy and/or biopsy is recommended. Minimal peripheral postobstructive changes noted in left lower lobe posterior see axial image 34. A pleural based nodule in left lower lobe measures 7 mm please see axial image 24. No destructive rib lesions are noted. No segmental infiltrate or pulmonary edema. There is no pneumothorax. CT ABDOMEN AND PELVIS Enhanced liver is unremarkable. No focal hepatic mass. No calcified gallstones are noted within gallbladder. The pancreas, spleen and adrenal glands are unremarkable. Abdominal aorta is unremarkable.  No aortic aneurysm. Kidneys are symmetrical in size and enhancement. No hydronephrosis or hydroureter. Delayed renal images shows bilateral renal symmetrical excretion. Bilateral visualized proximal ureter is unremarkable. There is no small bowel obstruction. No ascites or free air. No adenopathy. No pericecal inflammation. The patient is status post appendectomy. The terminal ileum is unremarkable. Prostate gland and seminal vesicles are unremarkable. Again noted fat containing left inguinal scrotal canal hernia without evidence of acute complication. There is a right inguinal lymph node measures 1.5 cm in short-axis. A left inguinal lymph node measures 1.7 cm in short-axis. These are borderline enlarged probable reactive. Axial image 127 again noted inflammatory process surrounding the distal  rectum and perianal region. Again noted small perianal fluid collection consistent with abscess measures  about 3.2 by 1.3 by 1.4 cm. Sagittal images of the spine shows again bilateral pars defect at L5 level. Stable facet degenerative changes L4 and L5 level. IMPRESSION: 1. There is somewhat lobulated clustered nodular consolidation in left lower lobe centrally infrahilar region. This measures at least 3.3 x 2.8 cm. This is highly suspicious for malignancy. Further correlation with PET scan, bronchoscopy and/or biopsy is recommended. Small parenchymal postobstructive changes noted in left lower lobe posterior to the lesion, please see axial image 35. A pleural based nodule in left lower lobe posteriorly measures 7 mm. 2. Mild emphysematous changes bilateral upper lobe. 3. No mediastinal or hilar adenopathy. 4. Again noted perirectal and perianal inflammatory changes with a perianal abscess measures 3.2 x 1.3 x 1.4 cm without significant change from prior exam. No air-fluid level is noted within abscess. 5. No hydronephrosis or hydroureter. 6. Status post appendectomy. No pericecal inflammation. Unremarkable terminal ileum. 7. No small bowel obstruction. 8. Borderline bilateral inguinal lymph nodes probable reactive. 9. Stable degenerative changes lumbar spine. Electronically Signed   By: Lahoma Crocker M.D.   On: 03/10/2015 13:10   Ct Abdomen Pelvis W Contrast  03/10/2015  CLINICAL DATA:  Perirectal access, rectal pain, possible mass or focal airspace disease left lower lobe. EXAM: CT CHEST, ABDOMEN, AND PELVIS WITH CONTRAST TECHNIQUE: Multidetector CT imaging of the chest, abdomen and pelvis was performed following the standard protocol during bolus administration of intravenous contrast. CONTRAST:  139mL OMNIPAQUE IOHEXOL 300 MG/ML  SOLN COMPARISON:  CT scan abdomen and pelvis 03/07/2015 and CT of the chest and abdomen 08/17/2009. FINDINGS: CT CHEST Sagittal images of the spine shows degenerative changes thoracic spine. Sagittal view of the sternum is unremarkable. Images of the thoracic inlet are unremarkable. Central  airways are patent. No mediastinal hematoma or adenopathy. Heart size within normal limits. No pericardial effusion. There is no hilar adenopathy. Central thoracic aorta and pulmonary artery is unremarkable. Mild centrilobular emphysematous changes are noted bilateral upper lobe. As noted on recent CT scan of the abdomen again noted multilobular somewhat clustered nodular consolidation in left lower lobe centrally posteriorly infrahilar region. This is best seen in axial image 33 measures at least 3.3 by 2.8 cm. This is confirmed on coronal image 81. This is highly suspicious for malignancy. Further correlation with PET scan, bronchoscopy and/or biopsy is recommended. Minimal peripheral postobstructive changes noted in left lower lobe posterior see axial image 34. A pleural based nodule in left lower lobe measures 7 mm please see axial image 24. No destructive rib lesions are noted. No segmental infiltrate or pulmonary edema. There is no pneumothorax. CT ABDOMEN AND PELVIS Enhanced liver is unremarkable. No focal hepatic mass. No calcified gallstones are noted within gallbladder. The pancreas, spleen and adrenal glands are unremarkable. Abdominal aorta is unremarkable.  No aortic aneurysm. Kidneys are symmetrical in size and enhancement. No hydronephrosis or hydroureter. Delayed renal images shows bilateral renal symmetrical excretion. Bilateral visualized proximal ureter is unremarkable. There is no small bowel obstruction. No ascites or free air. No adenopathy. No pericecal inflammation. The patient is status post appendectomy. The terminal ileum is unremarkable. Prostate gland and seminal vesicles are unremarkable. Again noted fat containing left inguinal scrotal canal hernia without evidence of acute complication. There is a right inguinal lymph node measures 1.5 cm in short-axis. A left inguinal lymph node measures 1.7 cm in short-axis. These are borderline enlarged probable reactive. Axial image  127 again  noted inflammatory process surrounding the distal rectum and perianal region. Again noted small perianal fluid collection consistent with abscess measures about 3.2 by 1.3 by 1.4 cm. Sagittal images of the spine shows again bilateral pars defect at L5 level. Stable facet degenerative changes L4 and L5 level. IMPRESSION: 1. There is somewhat lobulated clustered nodular consolidation in left lower lobe centrally infrahilar region. This measures at least 3.3 x 2.8 cm. This is highly suspicious for malignancy. Further correlation with PET scan, bronchoscopy and/or biopsy is recommended. Small parenchymal postobstructive changes noted in left lower lobe posterior to the lesion, please see axial image 35. A pleural based nodule in left lower lobe posteriorly measures 7 mm. 2. Mild emphysematous changes bilateral upper lobe. 3. No mediastinal or hilar adenopathy. 4. Again noted perirectal and perianal inflammatory changes with a perianal abscess measures 3.2 x 1.3 x 1.4 cm without significant change from prior exam. No air-fluid level is noted within abscess. 5. No hydronephrosis or hydroureter. 6. Status post appendectomy. No pericecal inflammation. Unremarkable terminal ileum. 7. No small bowel obstruction. 8. Borderline bilateral inguinal lymph nodes probable reactive. 9. Stable degenerative changes lumbar spine. Electronically Signed   By: Lahoma Crocker M.D.   On: 03/10/2015 13:10    Anti-infectives: Anti-infectives    Start     Dose/Rate Route Frequency Ordered Stop   03/07/15 2330  piperacillin-tazobactam (ZOSYN) IVPB 3.375 g     3.375 g 12.5 mL/hr over 240 Minutes Intravenous Every 8 hours 03/07/15 2329     03/07/15 2315  piperacillin-tazobactam (ZOSYN) IVPB 3.375 g  Status:  Discontinued     3.375 g 100 mL/hr over 30 Minutes Intravenous 3 times per day 03/07/15 2313 03/07/15 2329   03/07/15 1930  vancomycin (VANCOCIN) IVPB 1000 mg/200 mL premix     1,000 mg 200 mL/hr over 60 Minutes Intravenous  Once  03/07/15 1920 03/07/15 2142   03/07/15 1830  piperacillin-tazobactam (ZOSYN) IVPB 3.375 g  Status:  Discontinued     3.375 g 12.5 mL/hr over 240 Minutes Intravenous  Once 03/07/15 1818 03/07/15 1818   03/07/15 1830  piperacillin-tazobactam (ZOSYN) IVPB 3.375 g     3.375 g 12.5 mL/hr over 240 Minutes Intravenous  Once 03/07/15 1819 03/07/15 2142      Assessment/Plan: Impression: Resolving perianal abscess. Plan: Patient is undergoing CT scan of the chest later today. I would recommend 2 weeks total of ciprofloxacin and Flagyl by mouth. I can follow him up as an outpatient. No need for surgical intervention at this time.  LOS: 3 days    Kerri Kovacik A 03/10/2015

## 2015-03-10 NOTE — Progress Notes (Signed)
[facility]                                                                              Patient Demographics  Fernando [name], is a 55 y.o. male, DOB - 1960/09/03, [id number]  Admit date - 03/07/2015   Admitting Physician Orvan Falconer, MD  Outpatient Primary MD for the patient is MANN, BENJAMIN, PA-C  LOS - 3   Chief Complaint  Patient presents with  . Rectal Pain       Brief HPI   Fernando [name] is an 55 y.o. male with hx of HTN, prior hx of rectal abscess requiring surgery in the distant past, no hx of crohn's disease, presented with rectal pain for several days. He denied fever, chills, rectal bleeding, and had BM yesterday. Evaluation in the ER included a leukocytosis with WBC of 12K, and Cr of 1.25. His abd/pelvic CT showed peri rectal abscess, and possible infiltrate. Radiologist had recommneded a follow CXR in 3 months.  Assessment & Plan   Perirectal abscess/abd pain: -Continue IV Zosyn  -Continue clear liquid diet, surgery following  -abscess spontaneously draining now. -Repeat CT abdomen and pelvis pending  bronchitis/bronchiectasis with exacerbation, nicotine abuse - Chest x-ray had shown right midlung and left lower lobe consolidative opacities, pulmonary mass cannot be excluded - Continue IV Zosyn, flutter valve, Pulmicort, PRN nebulizers - will obtain CT chest with contrast to rule out malignancy given long-standing history of nicotine abuse   HTN: -continue current antihypertensive regimen  -BP is well controlled   Acute kidney injury: pre-renal in nature and secondary to dehydration -Creatinine function improving, continue gentle hydration  tobacco abuse: -Cessation counseling provided  -placed ast on nicotine patch  Anxiety: continue xanax PRN  Diarrhea: -C diff negative   Code Status: full code   Family Communication: Discussed in detail with the patient, all imaging results, lab results explained to the patient     Disposition Plan:   Time Spent in minutes   25 minutes  Procedures  CT abdomen  Consults   Surgery  DVT Prophylaxis  Lovenox  Medications  Scheduled Meds: . antiseptic oral rinse  7 mL Mouth Rinse BID  . budesonide (PULMICORT) nebulizer solution  0.25 mg Nebulization BID  . diatrizoate meglumine-sodium      . enoxaparin (LOVENOX) injection  40 mg Subcutaneous Q24H  . hydrochlorothiazide  12.5 mg Oral Daily  . lisinopril  20 mg Oral Daily  . nicotine  21 mg Transdermal Daily  . piperacillin-tazobactam (ZOSYN)  IV  3.375 g Intravenous Q8H  . saccharomyces boulardii  250 mg Oral BID   Continuous Infusions: . sodium chloride 75 mL/hr at 03/09/15 0500  . dextrose 5 % and 0.9% NaCl 100 mL/hr at 03/09/15 0709   PRN Meds:.acetaminophen, albuterol, ALPRAZolam, HYDROmorphone (DILAUDID) injection   Antibiotics   Anti-infectives    Start     Dose/Rate Route Frequency Ordered Stop   03/07/15 2330  piperacillin-tazobactam (ZOSYN) IVPB 3.375 g     3.375 g 12.5 mL/hr over 240 Minutes Intravenous Every 8 hours 03/07/15 2329     03/07/15 2315  piperacillin-tazobactam (ZOSYN) IVPB 3.375 g  Status:  Discontinued     3.375 g 100 mL/hr over 30 Minutes Intravenous 3 times per day 03/07/15 2313 03/07/15 2329   03/07/15 1930  vancomycin (VANCOCIN) IVPB 1000 mg/200 mL premix     1,000 mg 200 mL/hr over 60 Minutes Intravenous  Once 03/07/15 1920 03/07/15 2142   03/07/15 1830  piperacillin-tazobactam (ZOSYN) IVPB 3.375 g  Status:  Discontinued     3.375 g 12.5 mL/hr over 240 Minutes Intravenous  Once 03/07/15 1818 03/07/15 1818   03/07/15 1830  piperacillin-tazobactam (ZOSYN) IVPB 3.375 g     3.375 g 12.5 mL/hr over 240 Minutes Intravenous  Once 03/07/15 1819 03/07/15 2142        Subjective:   Fernando [name] was seen and examined today.  Complains of abdominal pain and perirectal pain upon coughing or straining. Patient denies dizziness, chest pain, shortness of breath, nausea,  vomiting, new weakness, numbess, tingling. No acute events overnight.  No fevers or chills.  Objective:   Filed Vitals:   03/09/15 2101 03/10/15 0535 03/10/15 1003 03/10/15 1014  BP: 110/65 92/70  118/87  Pulse: 85 72  88  Temp: 98.2 F (36.8 C) 98 F (36.7 C)    TempSrc: Oral Oral    Resp: 18 20    Height:      Weight:      SpO2: 95% 97% 98% 100%    Intake/Output Summary (Last 24 hours) at 03/10/15 1034 Last data filed at 03/10/15 0800  Gross per 24 hour  Intake    480 ml  Output    500 ml  Net    -20 ml     Wt Readings from Last 3 Encounters:  03/07/15 113.309 kg (249 lb 12.8 oz)  11/10/14 106.595 kg (235 lb)  04/29/13 99.837 kg (220 lb 1.6 oz)     Exam  General: Alert and oriented x 3, NAD  HEENT:  PERRLA, EOMI, Anicteric Sclera, mucous membranes moist.   Neck: Supple, no JVD, no masses  CVS: S1 S2 auscultated, no rubs, murmurs or gallops. Regular rate and rhythm.  Respiratory: Clear to auscultation bilaterally, no wheezing, rales or rhonchi  Abdomen: Soft, NT, nondistended, + bowel sounds  Ext: no cyanosis clubbing or edema  Neuro: AAOx3, Cr N's II- XII. Strength 5/5 upper and lower extremities bilaterally  Skin: No rashes  Psych: Normal affect and demeanor, alert and oriented x3    Data Review   Micro Results Recent Results (from the past 240 hour(s))  Blood culture (routine x 2)     Status: None (Preliminary result)   Collection Time: 03/07/15  7:52 PM  Result Value Ref Range Status   Specimen Description BLOOD LEFT ANTECUBITAL  Final   Special Requests BOTTLES DRAWN AEROBIC AND ANAEROBIC 6CC EACH  Final   Culture NO GROWTH 3 DAYS  Final   Report Status PENDING  Incomplete  Blood culture (routine x 2)     Status: None (Preliminary result)   Collection Time: 03/07/15  8:03 PM  Result Value Ref Range Status   Specimen Description BLOOD LEFT HAND  Final   Special Requests BOTTLES DRAWN AEROBIC AND ANAEROBIC 4CC EACH  Final   Culture NO  GROWTH 3 DAYS  Final   Report Status PENDING  Incomplete  C difficile quick scan w PCR reflex     Status: None   Collection Time: 03/09/15  1:20 PM  Result Value Ref Range Status   C Diff antigen NEGATIVE NEGATIVE Final   C Diff toxin NEGATIVE NEGATIVE  Final   C Diff interpretation Negative for toxigenic C. difficile  Final    Radiology Reports Dg Chest 2 View  03/07/2015  CLINICAL DATA:  Patient with airspace opacities on recent CT. EXAM: CHEST  2 VIEW COMPARISON:  Chest radiograph 11/10/2014 FINDINGS: Anterior cervical spinal fusion hardware. Stable cardiomegaly and tortuosity of the thoracic aorta. Focal consolidation is demonstrated within the left lower lobe. Additionally consolidative opacities are demonstrated within the peripheral right mid lung. No pleural effusion or pneumothorax. Mid thoracic spine degenerative changes. IMPRESSION: Right mid lung and left lower lobe consolidative opacities which are nonspecific however may represent infection in the appropriate clinical setting. Additional consideration of pulmonary mass is not excluded. Followup PA and lateral chest X-ray is recommended in 3-4 weeks following trial of antibiotic therapy to ensure resolution and exclude underlying malignancy. Electronically Signed   By: Lovey Newcomer M.D.   On: 03/07/2015 19:02   Ct Abdomen Pelvis W Contrast  03/07/2015  CLINICAL DATA:  55 year old presenting with a 3 week history of rectal pain. Personal history of perirectal abscess requiring surgical drainage. Surgical history also includes appendectomy and unspecified hernia repair. EXAM: CT ABDOMEN AND PELVIS WITH CONTRAST TECHNIQUE: Multidetector CT imaging of the abdomen and pelvis was performed using the standard protocol following bolus administration of intravenous contrast. CONTRAST:  161mL OMNIPAQUE IOHEXOL 300 MG/ML IV. Oral contrast was also administered. COMPARISON:  04/29/2013, 08/17/2009, 10/30/2006, 09/16/2006. FINDINGS: Lower chest: Mass or  focal airspace consolidation involving the left lower lobe, incompletely imaged. Heart size upper normal. Hepatobiliary: Liver normal in size and appearance. Gallbladder normal in appearance without calcified gallstones. No biliary ductal dilation. Pancreas: Normal in appearance without evidence of mass, ductal dilation, or inflammation. Spleen: Normal in size and appearance. Adrenals/Urinary Tract: Normal appearing adrenal glands. Kidneys normal in size and appearance without focal parenchymal abnormality. No evidence of urinary tract calculi or obstruction. Normal-appearing urinary bladder. Stomach/Bowel: Normal-appearing stomach filled with food. Normal-appearing small bowel. Numerous small bowel loops lateral to the ascending colon, a new finding. Edema/inflammation surrounding the distal rectum, extending into the perirectal and perianal tissues below the anal verge. Approximate 2.9 x 1.3 x 1.7 cm fluid collection in the perianal region. Remainder of the colon decompressed and unremarkable. Appendix surgically absent. Vascular/Lymphatic: Mild iliofemoral atherosclerosis. Patent visceral arteries. Normal-appearing portal venous and systemic venous systems. Scattered normal sized retroperitoneal lymph nodes. No pathologic lymphadenopathy. Reproductive: Mild median lobe prostate gland enlargement as noted previously. Normal seminal vesicles. Other: None. 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 Facet degenerative changes at L4-5 and L5-S1. No acute abnormality. IMPRESSION: 1. Perirectal and perianal inflammation with a small perianal abscess measured above. 2. Small bowel loops lateral to the ascending colon which may indicate an internal hernia. 3. Airspace consolidation versus mass involving the visualized left lower lobe, incompletely imaged on the current CT. 4. Stable mild median lobe  prostate gland enlargement (likely BPH). Electronically Signed   By: Evangeline Dakin M.D.   On: 03/07/2015 18:02    CBC  Recent Labs Lab 03/07/15 1619 03/08/15 0906 03/09/15 0545 03/10/15 0613  WBC 12.3* 13.8* 14.8* 10.5  HGB 13.8 12.9* 14.1 14.3  HCT 40.7 37.7* 40.9 41.1  PLT 237 165 246 240  MCV 96.0 96.4 96.7 95.1  MCH 32.5 33.0 33.3 33.1  MCHC 33.9 34.2 34.5 34.8  RDW 13.9 13.9 13.6 13.3  LYMPHSABS 1.9  --   --   --  MONOABS 1.2*  --   --   --   EOSABS 0.5  --   --   --   BASOSABS 0.0  --   --   --     Chemistries   Recent Labs Lab 03/07/15 1619 03/08/15 0906 03/09/15 0545 03/10/15 0613  NA 140 132* 136 136  K 3.7 3.7 3.8 3.3*  CL 108 101 98* 100*  CO2 25 23 31 28   GLUCOSE 109* 126* 116* 106*  BUN 12 5* 6 9  CREATININE 1.25* 1.18 1.16 1.11  CALCIUM 8.3* 7.6* 8.5* 8.4*  AST 21  --   --   --   ALT 20  --   --   --   ALKPHOS 89  --   --   --   BILITOT 0.2*  --   --   --    ------------------------------------------------------------------------------------------------------------------ estimated creatinine clearance is 103 mL/min (by C-G formula based on Cr of 1.11). ------------------------------------------------------------------------------------------------------------------ No results for input(s): HGBA1C in the last 72 hours. ------------------------------------------------------------------------------------------------------------------ No results for input(s): CHOL, HDL, LDLCALC, TRIG, CHOLHDL, LDLDIRECT in the last 72 hours. ------------------------------------------------------------------------------------------------------------------ No results for input(s): TSH, T4TOTAL, T3FREE, THYROIDAB in the last 72 hours.  Invalid input(s): FREET3 ------------------------------------------------------------------------------------------------------------------ No results for input(s): VITAMINB12, FOLATE, FERRITIN, TIBC, IRON, RETICCTPCT in the last 72  hours.  Coagulation profile No results for input(s): INR, PROTIME in the last 168 hours.  No results for input(s): DDIMER in the last 72 hours.  Cardiac Enzymes No results for input(s): CKMB, TROPONINI, MYOGLOBIN in the last 168 hours.  Invalid input(s): CK ------------------------------------------------------------------------------------------------------------------ Invalid input(s): POCBNP  No results for input(s): GLUCAP in the last 72 hours.   Anysia Choi M.D. [facility] 03/10/2015, 10:34 AM  Pager: [phone] Between 7am to 7pm - call Pager - [phone]  After 7pm go to [email or web address] - password [email or web address]  Call night coverage person covering after 7pm

## 2015-03-11 DIAGNOSIS — K611 Rectal abscess: Secondary | ICD-10-CM

## 2015-03-11 DIAGNOSIS — I10 Essential (primary) hypertension: Secondary | ICD-10-CM

## 2015-03-11 DIAGNOSIS — J189 Pneumonia, unspecified organism: Secondary | ICD-10-CM

## 2015-03-11 LAB — CBC
HEMATOCRIT: 39.7 % (ref 39.0–52.0)
HEMOGLOBIN: 13.6 g/dL (ref 13.0–17.0)
MCH: 32.7 pg (ref 26.0–34.0)
MCHC: 34.3 g/dL (ref 30.0–36.0)
MCV: 95.4 fL (ref 78.0–100.0)
Platelets: 256 10*3/uL (ref 150–400)
RBC: 4.16 MIL/uL — ABNORMAL LOW (ref 4.22–5.81)
RDW: 13.2 % (ref 11.5–15.5)
WBC: 7.9 10*3/uL (ref 4.0–10.5)

## 2015-03-11 LAB — BASIC METABOLIC PANEL
ANION GAP: 7 (ref 5–15)
BUN: 10 mg/dL (ref 6–20)
CALCIUM: 8.4 mg/dL — AB (ref 8.9–10.3)
CHLORIDE: 103 mmol/L (ref 101–111)
CO2: 27 mmol/L (ref 22–32)
Creatinine, Ser: 1.04 mg/dL (ref 0.61–1.24)
GFR calc Af Amer: >60 mL/min (ref >60)
GFR calc non Af Amer: >60 mL/min (ref >60)
GLUCOSE: 105 mg/dL — AB (ref 65–99)
POTASSIUM: 3.5 mmol/L (ref 3.5–5.1)
Sodium: 137 mmol/L (ref 135–145)

## 2015-03-11 NOTE — Progress Notes (Signed)
CT scan shows perirectal inflammation, but no drainable abscess. I suspect that it is already drained on its own. No need for surgical intervention at this time. Will follow peripherally with you.

## 2015-03-11 NOTE — Progress Notes (Signed)
[facility]                                                                              Patient Demographics  Fernando [name], is a 55 y.o. male, DOB - [birth year], [id number]  Admit date - 03/07/2015   Admitting Physician Orvan Falconer, MD  Outpatient Primary MD for the patient is MANN, BENJAMIN, PA-C  LOS - 4   Chief Complaint  Patient presents with  . Rectal Pain       Brief HPI   Fernando [name] is an 55 y.o. male with hx of HTN, prior hx of rectal abscess requiring surgery in the distant past, no hx of crohn's disease, presented with rectal pain for several days. He denied fever, chills, rectal bleeding, and had BM yesterday. Evaluation in the ER included a leukocytosis with WBC of 12K, and Cr of 1.25. His abd/pelvic CT showed peri rectal abscess, and possible infiltrate. Radiologist had recommneded a follow CXR in 3 months.  Assessment & Plan   Perirectal abscess/abd pain: (reported prior episode in 1998 required surgery and prolonged hospitalization ) -Continue IV Zosyn  -abscess spontaneously draining now. -Repeat CT abdomen and pelvis on 3/1 result noted, per surgery no drainable abscess, continue abx.  bronchitis/bronchiectasis with exacerbation, nicotine abuse - Chest x-ray had shown right midlung and left lower lobe consolidative opacities, pulmonary mass cannot be excluded - Continue IV Zosyn, flutter valve, Pulmicort, PRN nebulizers - CT chest with contrast on 3/1 result noted, pulmonology Dr Luan Pulling consulted, input appreciated, continue treat copd /pna, then need outpatient PET scan, +/-bronchoscopy after PET scan.   HTN: -continue current antihypertensive regimen  -BP is well controlled   Acute kidney injury: pre-renal in nature and secondary to dehydration -Creatinine function improving, continue gentle hydration  tobacco abuse: -Cessation counseling provided  -placed ast on nicotine patch  Anxiety: continue xanax PRN  Diarrhea: -C  diff negative   Code Status: full code   Family Communication: Discussed in detail with the patient, all imaging results, lab results explained to the patient    Disposition Plan: pending  Time Spent in minutes   25 minutes  Procedures  CT abdomen  Consults   Surgery Pulmonary   DVT Prophylaxis  Lovenox  Medications  Scheduled Meds: . antiseptic oral rinse  7 mL Mouth Rinse BID  . budesonide (PULMICORT) nebulizer solution  0.25 mg Nebulization BID  . enoxaparin (LOVENOX) injection  40 mg Subcutaneous Q24H  . hydrochlorothiazide  12.5 mg Oral Daily  . lisinopril  20 mg Oral Daily  . nicotine  21 mg Transdermal Daily  . piperacillin-tazobactam (ZOSYN)  IV  3.375 g Intravenous Q8H  . saccharomyces boulardii  250 mg Oral BID   Continuous Infusions: . sodium chloride 75 mL/hr at 03/09/15 0500  . dextrose 5 % and 0.9% NaCl 100 mL/hr at 03/11/15 0451   PRN Meds:.acetaminophen, albuterol, ALPRAZolam, HYDROmorphone (DILAUDID) injection, oxyCODONE-acetaminophen, promethazine   Antibiotics   Anti-infectives    Start     Dose/Rate Route Frequency Ordered Stop   03/07/15 2330  piperacillin-tazobactam (ZOSYN) IVPB 3.375 g     3.375 g 12.5 mL/hr over 240  Minutes Intravenous Every 8 hours 03/07/15 2329     03/07/15 2315  piperacillin-tazobactam (ZOSYN) IVPB 3.375 g  Status:  Discontinued     3.375 g 100 mL/hr over 30 Minutes Intravenous 3 times per day 03/07/15 2313 03/07/15 2329   03/07/15 1930  vancomycin (VANCOCIN) IVPB 1000 mg/200 mL premix     1,000 mg 200 mL/hr over 60 Minutes Intravenous  Once 03/07/15 1920 03/07/15 2142   03/07/15 1830  piperacillin-tazobactam (ZOSYN) IVPB 3.375 g  Status:  Discontinued     3.375 g 12.5 mL/hr over 240 Minutes Intravenous  Once 03/07/15 1818 03/07/15 1818   03/07/15 1830  piperacillin-tazobactam (ZOSYN) IVPB 3.375 g     3.375 g 12.5 mL/hr over 240 Minutes Intravenous  Once 03/07/15 1819 03/07/15 2142        Subjective:   Fernando [name] was seen and examined today.  Complains of persistent perirectal pain upon coughing or straining. Patient denies dizziness, chest pain, shortness of breath, nausea, vomiting, new weakness, numbess, tingling. No acute events overnight.  No fevers or chills.  Objective:   Filed Vitals:   03/10/15 2017 03/10/15 2051 03/11/15 0700 03/11/15 0831  BP:  118/78 100/68   Pulse:  83 70   Temp:  98.1 F (36.7 C) 97.8 F (36.6 C)   TempSrc:  Oral Oral   Resp:  18 20   Height:      Weight:      SpO2: 92% 96% 95% 92%    Intake/Output Summary (Last 24 hours) at 03/11/15 0945 Last data filed at 03/11/15 0700  Gross per 24 hour  Intake   1480 ml  Output   1100 ml  Net    380 ml     Wt Readings from Last 3 Encounters:  03/07/15 113.309 kg (249 lb 12.8 oz)  11/10/14 106.595 kg (235 lb)  04/29/13 99.837 kg (220 lb 1.6 oz)     Exam  General: Alert and oriented x 3, NAD  HEENT:  PERRLA, EOMI, Anicteric Sclera, mucous membranes moist.   Neck: Supple, no JVD, no masses  CVS: S1 S2 auscultated, no rubs, murmurs or gallops. Regular rate and rhythm.  Respiratory: Clear to auscultation bilaterally, no wheezing, rales or rhonchi  Abdomen: Soft, NT, nondistended, + bowel sounds, perirectal abscess on right, tender to touch.  Ext: no cyanosis clubbing or edema  Neuro: AAOx3, Cr N's II- XII. Strength 5/5 upper and lower extremities bilaterally  Skin: No rashes  Psych: Normal affect and demeanor, alert and oriented x3    Data Review   Micro Results Recent Results (from the past 240 hour(s))  Blood culture (routine x 2)     Status: None (Preliminary result)   Collection Time: 03/07/15  7:52 PM  Result Value Ref Range Status   Specimen Description BLOOD LEFT ANTECUBITAL  Final   Special Requests BOTTLES DRAWN AEROBIC AND ANAEROBIC 6CC EACH  Final   Culture NO GROWTH 3 DAYS  Final   Report Status PENDING  Incomplete  Blood culture (routine x 2)     Status: None (Preliminary  result)   Collection Time: 03/07/15  8:03 PM  Result Value Ref Range Status   Specimen Description BLOOD LEFT HAND  Final   Special Requests BOTTLES DRAWN AEROBIC AND ANAEROBIC 4CC EACH  Final   Culture NO GROWTH 3 DAYS  Final   Report Status PENDING  Incomplete  C difficile quick scan w PCR reflex     Status: None   Collection Time:  03/09/15  1:20 PM  Result Value Ref Range Status   C Diff antigen NEGATIVE NEGATIVE Final   C Diff toxin NEGATIVE NEGATIVE Final   C Diff interpretation Negative for toxigenic C. difficile  Final    Radiology Reports Dg Chest 2 View  03/07/2015  CLINICAL DATA:  Patient with airspace opacities on recent CT. EXAM: CHEST  2 VIEW COMPARISON:  Chest radiograph 11/10/2014 FINDINGS: Anterior cervical spinal fusion hardware. Stable cardiomegaly and tortuosity of the thoracic aorta. Focal consolidation is demonstrated within the left lower lobe. Additionally consolidative opacities are demonstrated within the peripheral right mid lung. No pleural effusion or pneumothorax. Mid thoracic spine degenerative changes. IMPRESSION: Right mid lung and left lower lobe consolidative opacities which are nonspecific however may represent infection in the appropriate clinical setting. Additional consideration of pulmonary mass is not excluded. Followup PA and lateral chest X-ray is recommended in 3-4 weeks following trial of antibiotic therapy to ensure resolution and exclude underlying malignancy. Electronically Signed   By: Lovey Newcomer M.D.   On: 03/07/2015 19:02   Ct Chest W Contrast  03/10/2015  CLINICAL DATA:  Perirectal access, rectal pain, possible mass or focal airspace disease left lower lobe. EXAM: CT CHEST, ABDOMEN, AND PELVIS WITH CONTRAST TECHNIQUE: Multidetector CT imaging of the chest, abdomen and pelvis was performed following the standard protocol during bolus administration of intravenous contrast. CONTRAST:  135mL OMNIPAQUE IOHEXOL 300 MG/ML  SOLN COMPARISON:  CT scan  abdomen and pelvis 03/07/2015 and CT of the chest and abdomen 08/17/2009. FINDINGS: CT CHEST Sagittal images of the spine shows degenerative changes thoracic spine. Sagittal view of the sternum is unremarkable. Images of the thoracic inlet are unremarkable. Central airways are patent. No mediastinal hematoma or adenopathy. Heart size within normal limits. No pericardial effusion. There is no hilar adenopathy. Central thoracic aorta and pulmonary artery is unremarkable. Mild centrilobular emphysematous changes are noted bilateral upper lobe. As noted on recent CT scan of the abdomen again noted multilobular somewhat clustered nodular consolidation in left lower lobe centrally posteriorly infrahilar region. This is best seen in axial image 33 measures at least 3.3 by 2.8 cm. This is confirmed on coronal image 81. This is highly suspicious for malignancy. Further correlation with PET scan, bronchoscopy and/or biopsy is recommended. Minimal peripheral postobstructive changes noted in left lower lobe posterior see axial image 34. A pleural based nodule in left lower lobe measures 7 mm please see axial image 24. No destructive rib lesions are noted. No segmental infiltrate or pulmonary edema. There is no pneumothorax. CT ABDOMEN AND PELVIS Enhanced liver is unremarkable. No focal hepatic mass. No calcified gallstones are noted within gallbladder. The pancreas, spleen and adrenal glands are unremarkable. Abdominal aorta is unremarkable.  No aortic aneurysm. Kidneys are symmetrical in size and enhancement. No hydronephrosis or hydroureter. Delayed renal images shows bilateral renal symmetrical excretion. Bilateral visualized proximal ureter is unremarkable. There is no small bowel obstruction. No ascites or free air. No adenopathy. No pericecal inflammation. The patient is status post appendectomy. The terminal ileum is unremarkable. Prostate gland and seminal vesicles are unremarkable. Again noted fat containing left  inguinal scrotal canal hernia without evidence of acute complication. There is a right inguinal lymph node measures 1.5 cm in short-axis. A left inguinal lymph node measures 1.7 cm in short-axis. These are borderline enlarged probable reactive. Axial image 127 again noted inflammatory process surrounding the distal rectum and perianal region. Again noted small perianal fluid collection consistent with abscess measures about 3.2 by  1.3 by 1.4 cm. Sagittal images of the spine shows again bilateral pars defect at L5 level. Stable facet degenerative changes L4 and L5 level. IMPRESSION: 1. There is somewhat lobulated clustered nodular consolidation in left lower lobe centrally infrahilar region. This measures at least 3.3 x 2.8 cm. This is highly suspicious for malignancy. Further correlation with PET scan, bronchoscopy and/or biopsy is recommended. Small parenchymal postobstructive changes noted in left lower lobe posterior to the lesion, please see axial image 35. A pleural based nodule in left lower lobe posteriorly measures 7 mm. 2. Mild emphysematous changes bilateral upper lobe. 3. No mediastinal or hilar adenopathy. 4. Again noted perirectal and perianal inflammatory changes with a perianal abscess measures 3.2 x 1.3 x 1.4 cm without significant change from prior exam. No air-fluid level is noted within abscess. 5. No hydronephrosis or hydroureter. 6. Status post appendectomy. No pericecal inflammation. Unremarkable terminal ileum. 7. No small bowel obstruction. 8. Borderline bilateral inguinal lymph nodes probable reactive. 9. Stable degenerative changes lumbar spine. Electronically Signed   By: Lahoma Crocker M.D.   On: 03/10/2015 13:10   Ct Abdomen Pelvis W Contrast  03/10/2015  CLINICAL DATA:  Perirectal access, rectal pain, possible mass or focal airspace disease left lower lobe. EXAM: CT CHEST, ABDOMEN, AND PELVIS WITH CONTRAST TECHNIQUE: Multidetector CT imaging of the chest, abdomen and pelvis was performed  following the standard protocol during bolus administration of intravenous contrast. CONTRAST:  126mL OMNIPAQUE IOHEXOL 300 MG/ML  SOLN COMPARISON:  CT scan abdomen and pelvis 03/07/2015 and CT of the chest and abdomen 08/17/2009. FINDINGS: CT CHEST Sagittal images of the spine shows degenerative changes thoracic spine. Sagittal view of the sternum is unremarkable. Images of the thoracic inlet are unremarkable. Central airways are patent. No mediastinal hematoma or adenopathy. Heart size within normal limits. No pericardial effusion. There is no hilar adenopathy. Central thoracic aorta and pulmonary artery is unremarkable. Mild centrilobular emphysematous changes are noted bilateral upper lobe. As noted on recent CT scan of the abdomen again noted multilobular somewhat clustered nodular consolidation in left lower lobe centrally posteriorly infrahilar region. This is best seen in axial image 33 measures at least 3.3 by 2.8 cm. This is confirmed on coronal image 81. This is highly suspicious for malignancy. Further correlation with PET scan, bronchoscopy and/or biopsy is recommended. Minimal peripheral postobstructive changes noted in left lower lobe posterior see axial image 34. A pleural based nodule in left lower lobe measures 7 mm please see axial image 24. No destructive rib lesions are noted. No segmental infiltrate or pulmonary edema. There is no pneumothorax. CT ABDOMEN AND PELVIS Enhanced liver is unremarkable. No focal hepatic mass. No calcified gallstones are noted within gallbladder. The pancreas, spleen and adrenal glands are unremarkable. Abdominal aorta is unremarkable.  No aortic aneurysm. Kidneys are symmetrical in size and enhancement. No hydronephrosis or hydroureter. Delayed renal images shows bilateral renal symmetrical excretion. Bilateral visualized proximal ureter is unremarkable. There is no small bowel obstruction. No ascites or free air. No adenopathy. No pericecal inflammation. The patient  is status post appendectomy. The terminal ileum is unremarkable. Prostate gland and seminal vesicles are unremarkable. Again noted fat containing left inguinal scrotal canal hernia without evidence of acute complication. There is a right inguinal lymph node measures 1.5 cm in short-axis. A left inguinal lymph node measures 1.7 cm in short-axis. These are borderline enlarged probable reactive. Axial image 127 again noted inflammatory process surrounding the distal rectum and perianal region. Again noted small perianal fluid  collection consistent with abscess measures about 3.2 by 1.3 by 1.4 cm. Sagittal images of the spine shows again bilateral pars defect at L5 level. Stable facet degenerative changes L4 and L5 level. IMPRESSION: 1. There is somewhat lobulated clustered nodular consolidation in left lower lobe centrally infrahilar region. This measures at least 3.3 x 2.8 cm. This is highly suspicious for malignancy. Further correlation with PET scan, bronchoscopy and/or biopsy is recommended. Small parenchymal postobstructive changes noted in left lower lobe posterior to the lesion, please see axial image 35. A pleural based nodule in left lower lobe posteriorly measures 7 mm. 2. Mild emphysematous changes bilateral upper lobe. 3. No mediastinal or hilar adenopathy. 4. Again noted perirectal and perianal inflammatory changes with a perianal abscess measures 3.2 x 1.3 x 1.4 cm without significant change from prior exam. No air-fluid level is noted within abscess. 5. No hydronephrosis or hydroureter. 6. Status post appendectomy. No pericecal inflammation. Unremarkable terminal ileum. 7. No small bowel obstruction. 8. Borderline bilateral inguinal lymph nodes probable reactive. 9. Stable degenerative changes lumbar spine. Electronically Signed   By: Lahoma Crocker M.D.   On: 03/10/2015 13:10   Ct Abdomen Pelvis W Contrast  03/07/2015  CLINICAL DATA:  55 year old presenting with a 3 week history of rectal pain. Personal  history of perirectal abscess requiring surgical drainage. Surgical history also includes appendectomy and unspecified hernia repair. EXAM: CT ABDOMEN AND PELVIS WITH CONTRAST TECHNIQUE: Multidetector CT imaging of the abdomen and pelvis was performed using the standard protocol following bolus administration of intravenous contrast. CONTRAST:  170mL OMNIPAQUE IOHEXOL 300 MG/ML IV. Oral contrast was also administered. COMPARISON:  04/29/2013, 08/17/2009, 10/30/2006, 09/16/2006. FINDINGS: Lower chest: Mass or focal airspace consolidation involving the left lower lobe, incompletely imaged. Heart size upper normal. Hepatobiliary: Liver normal in size and appearance. Gallbladder normal in appearance without calcified gallstones. No biliary ductal dilation. Pancreas: Normal in appearance without evidence of mass, ductal dilation, or inflammation. Spleen: Normal in size and appearance. Adrenals/Urinary Tract: Normal appearing adrenal glands. Kidneys normal in size and appearance without focal parenchymal abnormality. No evidence of urinary tract calculi or obstruction. Normal-appearing urinary bladder. Stomach/Bowel: Normal-appearing stomach filled with food. Normal-appearing small bowel. Numerous small bowel loops lateral to the ascending colon, a new finding. Edema/inflammation surrounding the distal rectum, extending into the perirectal and perianal tissues below the anal verge. Approximate 2.9 x 1.3 x 1.7 cm fluid collection in the perianal region. Remainder of the colon decompressed and unremarkable. Appendix surgically absent. Vascular/Lymphatic: Mild iliofemoral atherosclerosis. Patent visceral arteries. Normal-appearing portal venous and systemic venous systems. Scattered normal sized retroperitoneal lymph nodes. No pathologic lymphadenopathy. Reproductive: Mild median lobe prostate gland enlargement as noted previously. Normal seminal vesicles. Other: None. 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 Facet degenerative changes at L4-5 and L5-S1. No acute abnormality. IMPRESSION: 1. Perirectal and perianal inflammation with a small perianal abscess measured above. 2. Small bowel loops lateral to the ascending colon which may indicate an internal hernia. 3. Airspace consolidation versus mass involving the visualized left lower lobe, incompletely imaged on the current CT. 4. Stable mild median lobe prostate gland enlargement (likely BPH). Electronically Signed   By: Evangeline Dakin M.D.   On: 03/07/2015 18:02    CBC  Recent Labs Lab 03/07/15 1619 03/08/15 0906 03/09/15 0545 03/10/15 0613 03/11/15 0602  WBC 12.3* 13.8* 14.8* 10.5 7.9  HGB 13.8 12.9* 14.1 14.3 13.6  HCT 40.7  37.7* 40.9 41.1 39.7  PLT 237 165 246 240 256  MCV 96.0 96.4 96.7 95.1 95.4  MCH 32.5 33.0 33.3 33.1 32.7  MCHC 33.9 34.2 34.5 34.8 34.3  RDW 13.9 13.9 13.6 13.3 13.2  LYMPHSABS 1.9  --   --   --   --   MONOABS 1.2*  --   --   --   --   EOSABS 0.5  --   --   --   --   BASOSABS 0.0  --   --   --   --     Chemistries   Recent Labs Lab 03/07/15 1619 03/08/15 0906 03/09/15 0545 03/10/15 0613 03/11/15 0602  NA 140 132* 136 136 137  K 3.7 3.7 3.8 3.3* 3.5  CL 108 101 98* 100* 103  CO2 25 23 31 28 27   GLUCOSE 109* 126* 116* 106* 105*  BUN 12 5* 6 9 10   CREATININE 1.25* 1.18 1.16 1.11 1.04  CALCIUM 8.3* 7.6* 8.5* 8.4* 8.4*  AST 21  --   --   --   --   ALT 20  --   --   --   --   ALKPHOS 89  --   --   --   --   BILITOT 0.2*  --   --   --   --    ------------------------------------------------------------------------------------------------------------------ estimated creatinine clearance is 109.9 mL/min (by C-G formula based on Cr of 1.04). ------------------------------------------------------------------------------------------------------------------ No results for input(s): HGBA1C in the last 72  hours. ------------------------------------------------------------------------------------------------------------------ No results for input(s): CHOL, HDL, LDLCALC, TRIG, CHOLHDL, LDLDIRECT in the last 72 hours. ------------------------------------------------------------------------------------------------------------------ No results for input(s): TSH, T4TOTAL, T3FREE, THYROIDAB in the last 72 hours.  Invalid input(s): FREET3 ------------------------------------------------------------------------------------------------------------------ No results for input(s): VITAMINB12, FOLATE, FERRITIN, TIBC, IRON, RETICCTPCT in the last 72 hours.  Coagulation profile No results for input(s): INR, PROTIME in the last 168 hours.  No results for input(s): DDIMER in the last 72 hours.  Cardiac Enzymes No results for input(s): CKMB, TROPONINI, MYOGLOBIN in the last 168 hours.  Invalid input(s): CK ------------------------------------------------------------------------------------------------------------------ Invalid input(s): POCBNP  No results for input(s): GLUCAP in the last 72 hours.   Sabrine Patchen M.D. Ph.D. [facility] 03/11/2015, 9:45 AM Pager: [phone]   After 7pm go to [email or web address] - password [email or web address]  Call night coverage person covering after 7pm

## 2015-03-11 NOTE — Discharge Instructions (Signed)
Perirectal Abscess An abscess is an infected area that contains a collection of pus. A perirectal abscess is an abscess that is near the opening of the anus or around the rectum. A perirectal abscess can cause a lot of pain, especially during bowel movements. CAUSES This condition is almost always caused by an infection that starts in an anal gland. RISK FACTORS This condition is more likely to develop in:  People with diabetes or inflammatory bowel disease.  People whose body defense system (immune system) is weak.  People who have anal sex.  People who have a sexually transmitted disease (STD).  People who have certain kinds of cancers, such as rectal carcinoma, leukemia, or lymphoma. SYMPTOMS The main symptom of this condition is pain. The pain may be a throbbing pain that gets worse during bowel movements. Other symptoms include:  Fever.  Swelling.  Redness.  Bleeding.  Constipation. DIAGNOSIS The condition is diagnosed with a physical exam. If the abscess is not visible, a health care provider may need to place a finger inside the rectum to find the abscess. Sometimes, imaging tests are done to determine the size and location of the abscess. These tests may include:  An ultrasound.  An MRI.  A CT scan. TREATMENT This condition is usually treated with incision and drainage surgery. Incision and drainage surgery involves making an incision over the abscess to drain the pus. Treatment may also involve antibiotic medicine, pain medicine, stool softeners, or laxatives. HOME CARE INSTRUCTIONS  Take medicines only as directed by your health care provider.  If you were prescribed an antibiotic, finish all of it even if you start to feel better.  To relieve pain, try sitting:  In a warm, shallow bath (sitz bath).  On a heating pad with the setting on low.  On an inflatable donut-shaped cushion.  Follow any diet instructions as directed by your health care  provider.  Keep all follow-up visits as directed by your health care provider. This is important. SEEK MEDICAL CARE IF:  Your abscess is bleeding.  You have pain, swelling, or redness that is getting worse.  You are constipated.  You feel ill.  You have muscle aches or chills.  You have a fever.  Your symptoms return after the abscess has healed.   This information is not intended to replace advice given to you by your health care provider. Make sure you discuss any questions you have with your health care provider.   Document Released: 12/24/1999 Document Revised: 09/16/2014 Document Reviewed: 11/05/2013 Elsevier Interactive Patient Education 2016 [employer]  

## 2015-03-11 NOTE — Consult Note (Signed)
Full note dictated. CT has a area in the left lung that may represent malignancy. He is however having symptoms of COPD exacerbation and questionable pneumonia with Zosyn should be adequate treatment for that. I agree PET scan is probably the way to go from here and then plus minus bronchoscopy depending on the results of PET scan.

## 2015-03-12 ENCOUNTER — Other Ambulatory Visit (HOSPITAL_COMMUNITY): Payer: Self-pay | Admitting: Pulmonary Disease

## 2015-03-12 DIAGNOSIS — J852 Abscess of lung without pneumonia: Secondary | ICD-10-CM

## 2015-03-12 LAB — BASIC METABOLIC PANEL
Anion gap: 8 (ref 5–15)
BUN: 11 mg/dL (ref 6–20)
CHLORIDE: 103 mmol/L (ref 101–111)
CO2: 25 mmol/L (ref 22–32)
CREATININE: 1.06 mg/dL (ref 0.61–1.24)
Calcium: 8.3 mg/dL — ABNORMAL LOW (ref 8.9–10.3)
GFR calc Af Amer: >60 mL/min (ref >60)
GFR calc non Af Amer: >60 mL/min (ref >60)
GLUCOSE: 98 mg/dL (ref 65–99)
POTASSIUM: 3.5 mmol/L (ref 3.5–5.1)
SODIUM: 136 mmol/L (ref 135–145)

## 2015-03-12 LAB — CBC
HEMATOCRIT: 41.1 % (ref 39.0–52.0)
Hemoglobin: 14 g/dL (ref 13.0–17.0)
MCH: 32.4 pg (ref 26.0–34.0)
MCHC: 34.1 g/dL (ref 30.0–36.0)
MCV: 95.1 fL (ref 78.0–100.0)
PLATELETS: 277 10*3/uL (ref 150–400)
RBC: 4.32 MIL/uL (ref 4.22–5.81)
RDW: 13.1 % (ref 11.5–15.5)
WBC: 6.5 10*3/uL (ref 4.0–10.5)

## 2015-03-12 LAB — CULTURE, BLOOD (ROUTINE X 2)
Culture: NO GROWTH
Culture: NO GROWTH

## 2015-03-12 MED ORDER — BUDESONIDE-FORMOTEROL FUMARATE 80-4.5 MCG/ACT IN AERO: 2.0000 | INHALATION_SPRAY | Freq: Two times a day (BID) | RESPIRATORY_TRACT | Status: DC

## 2015-03-12 MED ORDER — METRONIDAZOLE 500 MG PO TABS: 500.0000 mg | ORAL_TABLET | Freq: Three times a day (TID) | ORAL | Status: DC

## 2015-03-12 MED ORDER — ALBUTEROL SULFATE HFA 108 (90 BASE) MCG/ACT IN AERS: 2.0000 | INHALATION_SPRAY | Freq: Four times a day (QID) | RESPIRATORY_TRACT | Status: DC | PRN

## 2015-03-12 MED ORDER — LEVOFLOXACIN 500 MG PO TABS
500.0000 mg | ORAL_TABLET | Freq: Every day | ORAL | Status: DC
Start: 2015-03-12 — End: 2015-04-01

## 2015-03-12 MED ORDER — SACCHAROMYCES BOULARDII 250 MG PO CAPS: 250.0000 mg | ORAL_CAPSULE | Freq: Two times a day (BID) | ORAL | Status: DC

## 2015-03-12 MED ORDER — HYDROCODONE-ACETAMINOPHEN 5-325 MG PO TABS: 1.0000 | ORAL_TABLET | Freq: Four times a day (QID) | ORAL | Status: DC | PRN

## 2015-03-12 MED ORDER — ALPRAZOLAM 1 MG PO TABS: 1.0000 mg | ORAL_TABLET | Freq: Three times a day (TID) | ORAL | Status: DC | PRN

## 2015-03-12 MED ORDER — METRONIDAZOLE 500 MG PO TABS
500.0000 mg | ORAL_TABLET | Freq: Three times a day (TID) | ORAL | Status: DC
  Administered 2015-03-12: 500 mg via ORAL
  Filled 2015-03-12: qty 1

## 2015-03-12 MED ORDER — PROMETHAZINE HCL 25 MG PO TABS: 25.0000 mg | ORAL_TABLET | Freq: Four times a day (QID) | ORAL | Status: DC | PRN

## 2015-03-12 MED ORDER — LISINOPRIL-HYDROCHLOROTHIAZIDE 20-12.5 MG PO TABS: 1.0000 | ORAL_TABLET | Freq: Every day | ORAL | Status: DC

## 2015-03-12 MED ORDER — LEVOFLOXACIN 500 MG PO TABS
500.0000 mg | ORAL_TABLET | Freq: Every day | ORAL | Status: DC
  Administered 2015-03-12: 500 mg via ORAL
  Filled 2015-03-12: qty 1

## 2015-03-12 NOTE — Progress Notes (Signed)
[name] discharged Home per MD order.  Discharge instructions reviewed and discussed with the patient, all questions and concerns answered. Copy of instructions and scripts given to patient.    Medication List    STOP taking these medications        doxycycline 50 MG capsule  Commonly known as:  VIBRAMYCIN     predniSONE 20 MG tablet  Commonly known as:  DELTASONE      TAKE these medications        albuterol 108 (90 Base) MCG/ACT inhaler  Commonly known as:  PROVENTIL HFA;VENTOLIN HFA  Inhale 2 puffs into the lungs every 6 (six) hours as needed for wheezing or shortness of breath.     ALPRAZolam 1 MG tablet  Commonly known as:  XANAX  Take 1 tablet (1 mg total) by mouth 3 (three) times daily as needed for anxiety.     budesonide-formoterol 80-4.5 MCG/ACT inhaler  Commonly known as:  SYMBICORT  Inhale 2 puffs into the lungs 2 (two) times daily.     HYDROcodone-acetaminophen 5-325 MG tablet  Commonly known as:  NORCO  Take 1 tablet by mouth every 6 (six) hours as needed for moderate pain.     levofloxacin 500 MG tablet  Commonly known as:  LEVAQUIN  Take 1 tablet (500 mg total) by mouth daily. X 2 weeks     lisinopril-hydrochlorothiazide 20-12.5 MG tablet  Commonly known as:  PRINZIDE,ZESTORETIC  Take 1 tablet by mouth daily.     metroNIDAZOLE 500 MG tablet  Commonly known as:  FLAGYL  Take 1 tablet (500 mg total) by mouth 3 (three) times daily. X 2 weeks     promethazine 25 MG tablet  Commonly known as:  PHENERGAN  Take 1 tablet (25 mg total) by mouth every 6 (six) hours as needed for nausea or vomiting.     saccharomyces boulardii 250 MG capsule  Commonly known as:  FLORASTOR  Take 1 capsule (250 mg total) by mouth 2 (two) times daily.        Patients skin is clean, dry and intact, no evidence of skin break down. IV site discontinued and catheter remains intact. Site without signs and symptoms of complications. Dressing and pressure applied.  Patient  escorted to car by NT in a wheelchair,  no distress noted upon discharge.  Fernando [name] Lizandro Spellman 03/12/2015 12:37 PM

## 2015-03-12 NOTE — Progress Notes (Signed)
He is being discharged today so I will plan to order PET scan as an outpatient. Once I get the results I will see him in my office. Thanks for allowing me to see him with you

## 2015-03-12 NOTE — Discharge Summary (Signed)
Physician Discharge Summary   Patient ID: Fernando [name] MRN: [id number] DOB/AGE: 53-15-1962 53 y.o.  Admit date: 03/07/2015 Discharge date: 03/12/2015  Primary Care Physician:  Collene Mares, PA-C  Discharge Diagnoses:    . Peri-rectal abscess . Hypertension . Abnormal CXR  Pneumonia/ left lower lobe consolidation versus mass  Nicotine abuse  Consults:   Gen. Surgery Pulmonology, Dr. Luan Pulling  Recommendations for Outpatient Follow-up:  1. Dr. Luan Pulling will order outpatient PET scan 2. Please repeat CBC/BMET at next visit 3.    DIET: heart healthy    Allergies:   Allergies  Allergen Reactions  . Morphine And Related Other (See Comments)    hallucinations     DISCHARGE MEDICATIONS: Current Discharge Medication List    START taking these medications   Details  budesonide-formoterol (SYMBICORT) 80-4.5 MCG/ACT inhaler Inhale 2 puffs into the lungs 2 (two) times daily. Qty: 1 Inhaler, Refills: 12    levofloxacin (LEVAQUIN) 500 MG tablet Take 1 tablet (500 mg total) by mouth daily. X 2 weeks Qty: 14 tablet, Refills: 0    metroNIDAZOLE (FLAGYL) 500 MG tablet Take 1 tablet (500 mg total) by mouth 3 (three) times daily. X 2 weeks Qty: 42 tablet, Refills: 0    promethazine (PHENERGAN) 25 MG tablet Take 1 tablet (25 mg total) by mouth every 6 (six) hours as needed for nausea or vomiting. Qty: 30 tablet, Refills: 0    saccharomyces boulardii (FLORASTOR) 250 MG capsule Take 1 capsule (250 mg total) by mouth 2 (two) times daily. Qty: 60 capsule, Refills: 0      CONTINUE these medications which have CHANGED   Details  albuterol (PROVENTIL HFA;VENTOLIN HFA) 108 (90 Base) MCG/ACT inhaler Inhale 2 puffs into the lungs every 6 (six) hours as needed for wheezing or shortness of breath. Qty: 1 Inhaler, Refills: 12    ALPRAZolam (XANAX) 1 MG tablet Take 1 tablet (1 mg total) by mouth 3 (three) times daily as needed for anxiety. Qty: 30 tablet, Refills: 0     HYDROcodone-acetaminophen (NORCO) 5-325 MG tablet Take 1 tablet by mouth every 6 (six) hours as needed for moderate pain. Qty: 30 tablet, Refills: 0    lisinopril-hydrochlorothiazide (PRINZIDE,ZESTORETIC) 20-12.5 MG tablet Take 1 tablet by mouth daily. Qty: 30 tablet, Refills: 3      STOP taking these medications     doxycycline (VIBRAMYCIN) 50 MG capsule      predniSONE (DELTASONE) 20 MG tablet          Brief H and P: For complete details please refer to admission H and P, but in brief Fernando [name] is an 55 y.o. male with hx of HTN, prior hx of rectal abscess requiring surgery in the distant past, no hx of crohn's disease, presented with rectal pain for several days. He denied fever, chills, rectal bleeding, and had BM yesterday. Evaluation in the ER included a leukocytosis with WBC of 12K, and Cr of 1.25. His abd/pelvic CT showed peri rectal abscess, and possible infiltrate. Radiologist had recommneded a follow CXR in 3 months.  Hospital Course:   Perirectal abscess/abd pain: (reported prior episode in 1998 required surgery and prolonged hospitalization ) - Patient was placed on IV Zosyn, abscess spontaneously draining now. -Repeat CT abdomen and pelvis on 3/1 result noted, per surgery no drainable abscess - No surgery was consulted, patient was seen by Dr. Arnoldo Morale, recommended 2 weeks of oral antibiotics, outpatient follow-up. Placed on oral levofloxacin and Flagyl which will cover bronchitis/pneumonia as well.  bronchitis/bronchiectasis with  exacerbation, nicotine abuse - Chest x-ray had shown right midlung and left lower lobe consolidative opacities, pulmonary mass cannot be excluded - Patient was continued on IV Zosyn IV Zosyn, flutter valve, Pulmicort, PRN nebulizers - CT chest with contrast on 3/1 result noted showed left lobe consolidation versus mass. Pulmonology Dr Luan Pulling was consulted, recommended continue treat copd /pna, then need outpatient PET scan,  +/-bronchoscopy after PET scan. Dr Luan Pulling will order PET scan    HTN: -continue current antihypertensive regimen  -BP is well controlled   Acute kidney injury: pre-renal in nature and secondary to dehydration, Cr 1.2 at admission -Creatinine function , 1.06  At discharge after gentle hydration  tobacco abuse: -Cessation counseling provided  - Patient declined a nicotine patch or any medication assistance  Anxiety: continue xanax PRN  Diarrhea: -C diff negative   Day of Discharge BP 110/72 mmHg  Pulse 70  Temp(Src) 98.4 F (36.9 C) (Oral)  Resp 20  Ht 6\' 2"  (1.88 m)  Wt 113.309 kg (249 lb 12.8 oz)  BMI 32.06 kg/m2  SpO2 98%  Physical Exam: General: Alert and awake oriented x3 not in any acute distress. HEENT: anicteric sclera, pupils reactive to light and accommodation CVS: S1-S2 clear no murmur rubs or gallops Chest: clear to auscultation bilaterally, no wheezing rales or rhonchi Abdomen: soft nontender, nondistended, normal bowel sounds Extremities: no cyanosis, clubbing or edema noted bilaterally Neuro: Cranial nerves II-XII intact, no focal neurological deficits   The results of significant diagnostics from this hospitalization (including imaging, microbiology, ancillary and laboratory) are listed below for reference.    LAB RESULTS: Basic Metabolic Panel:  Recent Labs Lab 03/11/15 0602 03/12/15 0621  NA 137 136  K 3.5 3.5  CL 103 103  CO2 27 25  GLUCOSE 105* 98  BUN 10 11  CREATININE 1.04 1.06  CALCIUM 8.4* 8.3*   Liver Function Tests:  Recent Labs Lab 03/07/15 1619  AST 21  ALT 20  ALKPHOS 89  BILITOT 0.2*  PROT 7.2  ALBUMIN 3.5   No results for input(s): LIPASE, AMYLASE in the last 168 hours. No results for input(s): AMMONIA in the last 168 hours. CBC:  Recent Labs Lab 03/07/15 1619  03/11/15 0602 03/12/15 0621  WBC 12.3*  < > 7.9 6.5  NEUTROABS 8.7*  --   --   --   HGB 13.8  < > 13.6 14.0  HCT 40.7  < > 39.7 41.1  MCV 96.0   < > 95.4 95.1  PLT 237  < > 256 277  < > = values in this interval not displayed. Cardiac Enzymes: No results for input(s): CKTOTAL, CKMB, CKMBINDEX, TROPONINI in the last 168 hours. BNP: Invalid input(s): POCBNP CBG: No results for input(s): GLUCAP in the last 168 hours.  Significant Diagnostic Studies:  Dg Chest 2 View  03/07/2015  CLINICAL DATA:  Patient with airspace opacities on recent CT. EXAM: CHEST  2 VIEW COMPARISON:  Chest radiograph 11/10/2014 FINDINGS: Anterior cervical spinal fusion hardware. Stable cardiomegaly and tortuosity of the thoracic aorta. Focal consolidation is demonstrated within the left lower lobe. Additionally consolidative opacities are demonstrated within the peripheral right mid lung. No pleural effusion or pneumothorax. Mid thoracic spine degenerative changes. IMPRESSION: Right mid lung and left lower lobe consolidative opacities which are nonspecific however may represent infection in the appropriate clinical setting. Additional consideration of pulmonary mass is not excluded. Followup PA and lateral chest X-ray is recommended in 3-4 weeks following trial of antibiotic therapy to ensure resolution  and exclude underlying malignancy. Electronically Signed   By: Lovey Newcomer M.D.   On: 03/07/2015 19:02   Ct Abdomen Pelvis W Contrast  03/07/2015  CLINICAL DATA:  55 year old presenting with a 3 week history of rectal pain. Personal history of perirectal abscess requiring surgical drainage. Surgical history also includes appendectomy and unspecified hernia repair. EXAM: CT ABDOMEN AND PELVIS WITH CONTRAST TECHNIQUE: Multidetector CT imaging of the abdomen and pelvis was performed using the standard protocol following bolus administration of intravenous contrast. CONTRAST:  142mL OMNIPAQUE IOHEXOL 300 MG/ML IV. Oral contrast was also administered. COMPARISON:  04/29/2013, 08/17/2009, 10/30/2006, 09/16/2006. FINDINGS: Lower chest: Mass or focal airspace consolidation involving  the left lower lobe, incompletely imaged. Heart size upper normal. Hepatobiliary: Liver normal in size and appearance. Gallbladder normal in appearance without calcified gallstones. No biliary ductal dilation. Pancreas: Normal in appearance without evidence of mass, ductal dilation, or inflammation. Spleen: Normal in size and appearance. Adrenals/Urinary Tract: Normal appearing adrenal glands. Kidneys normal in size and appearance without focal parenchymal abnormality. No evidence of urinary tract calculi or obstruction. Normal-appearing urinary bladder. Stomach/Bowel: Normal-appearing stomach filled with food. Normal-appearing small bowel. Numerous small bowel loops lateral to the ascending colon, a new finding. Edema/inflammation surrounding the distal rectum, extending into the perirectal and perianal tissues below the anal verge. Approximate 2.9 x 1.3 x 1.7 cm fluid collection in the perianal region. Remainder of the colon decompressed and unremarkable. Appendix surgically absent. Vascular/Lymphatic: Mild iliofemoral atherosclerosis. Patent visceral arteries. Normal-appearing portal venous and systemic venous systems. Scattered normal sized retroperitoneal lymph nodes. No pathologic lymphadenopathy. Reproductive: Mild median lobe prostate gland enlargement as noted previously. Normal seminal vesicles. Other: None. 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 Facet degenerative changes at L4-5 and L5-S1. No acute abnormality. IMPRESSION: 1. Perirectal and perianal inflammation with a small perianal abscess measured above. 2. Small bowel loops lateral to the ascending colon which may indicate an internal hernia. 3. Airspace consolidation versus mass involving the visualized left lower lobe, incompletely imaged on the current CT. 4. Stable mild median lobe prostate gland enlargement (likely BPH).  Electronically Signed   By: Evangeline Dakin M.D.   On: 03/07/2015 18:02       Disposition and Follow-up: Discharge Instructions    Diet - low sodium heart healthy    Complete by:  As directed      Discharge instructions    Complete by:  As directed   Please avoid straining and constipation. If you have no BM for 24 hours, you can take over the counter stool softener like colace.   Dr Luan Pulling is ordering your outpatient PET scan, you will be called with instructions.     Increase activity slowly    Complete by:  As directed             DISPOSITION: home    DISCHARGE FOLLOW-UP Follow-up Information    Follow up with Jamesetta So, MD On 03/25/2015.   Specialty:  General Surgery   Why:  at 9:15 am   Contact information:   [address] [place] [id number] [phone]       Follow up with HAWKINS,EDWARD L, MD. Schedule an appointment as soon as possible for a visit in 2 weeks.   Specialty:  Pulmonary Disease   Why:  for hospital follow-up   Contact information:   [address] [place] [address] [phone]  Follow up with Collene Mares, PA-C On 03/24/2015.   Specialties:  Physician Assistant, Internal Medicine   Why:  at 10:15 am   Contact information:   [address] [place] [id number] [phone]        Time spent on Discharge: 43mins   Signed:   Zamiah Tollett M.D. [facility] 03/12/2015, 12:29 PM Pager: [phone]

## 2015-03-12 NOTE — Care Management Note (Signed)
Case Management Note  Patient Details  Name: PORFIRIO ZULOAGA MRN: [id number] Date of Birth: 02/15/1960   Expected Discharge Date:  03/11/15               Expected Discharge Plan:  Home/Self Care  In-House Referral:  NA  Discharge planning Services  CM Consult, [facility]  Post Acute Care Choice:  NA Choice offered to:  NA  DME Arranged:    DME Agency:     HH Arranged:    HH Agency:     Status of Service:  Completed, signed off  Medicare Important Message Given:    Date Medicare IM Given:    Medicare IM give by:    Date Additional Medicare IM Given:    Additional Medicare Important Message give by:     If discussed at [facility] of Stay Meetings, dates discussed:    Additional Comments: Pt discharging home today with self care. Pt given MATCH voucher to cover dual abx. No further CM needs.  Sherald Barge, RN 03/12/2015, 11:33 AM

## 2015-03-16 ENCOUNTER — Encounter (HOSPITAL_COMMUNITY): Payer: Self-pay

## 2015-03-16 ENCOUNTER — Ambulatory Visit (HOSPITAL_COMMUNITY)
Admission: RE | Admit: 2015-03-16 | Discharge: 2015-03-16 | Disposition: A | Discharge status: Home / Self Care | Payer: MEDICAID | Source: Ambulatory Visit | Attending: Pulmonary Disease | Admitting: Pulmonary Disease

## 2015-03-16 DIAGNOSIS — J852 Abscess of lung without pneumonia: Secondary | ICD-10-CM

## 2015-03-16 DIAGNOSIS — I2510 Atherosclerotic heart disease of native coronary artery without angina pectoris: Secondary | ICD-10-CM | POA: Insufficient documentation

## 2015-03-16 DIAGNOSIS — K610 Anal abscess: Secondary | ICD-10-CM | POA: Insufficient documentation

## 2015-03-16 LAB — GLUCOSE, CAPILLARY: GLUCOSE-CAPILLARY: 94 mg/dL (ref 65–99)

## 2015-03-16 MED ORDER — FLUDEOXYGLUCOSE F - 18 (FDG) INJECTION
12.4500 | Freq: Once | INTRAVENOUS | Status: AC | PRN
  Administered 2015-03-16: 12.45 via INTRAVENOUS

## 2015-03-19 ENCOUNTER — Telehealth: Payer: Self-pay | Admitting: *Deleted

## 2015-03-19 NOTE — Telephone Encounter (Signed)
Oncology Nurse Navigator Documentation  Oncology Nurse Navigator Flowsheets 03/19/2015  Navigator Encounter Type Introductory phone call/I received a referral today on Fernando [name].  I called and scheduled him to be seen with thoracic surgeon on 04/01/15 arrive at 3:45.  I also left my name and phone number to call if needed.   Treatment Phase Abnormal Scans  Barriers/Navigation Needs Coordination of Care  Interventions Coordination of Care  Coordination of Care Appts  Acuity Level 1  Time Spent with Patient 15

## 2015-03-26 ENCOUNTER — Encounter: Payer: Self-pay | Admitting: *Deleted

## 2015-03-26 ENCOUNTER — Telehealth: Payer: Self-pay | Admitting: *Deleted

## 2015-03-26 NOTE — Telephone Encounter (Signed)
Oncology Nurse Navigator Documentation  Oncology Nurse Navigator Flowsheets 03/26/2015  Navigator Encounter Type Telephone  Telephone Outgoing Call  Treatment Phase Abnormal Scans  Barriers/Navigation Needs Coordination of Care  Interventions Coordination of Care  Coordination of Care Appts  Acuity Level 1  Time Spent with Patient 15   I called Fernando [name] and left vm message to call me.  I called referring office and left vm message with Dr. Luan Pulling nurse stating I stated I was having a hard time getting in tough with patient and asked for assistance.  I also asked that they return my phone call with an update giving my name and phone number.

## 2015-03-26 NOTE — Progress Notes (Signed)
Oncology Nurse Navigator Documentation  Oncology Nurse Navigator Flowsheets 03/26/2015  Navigator Encounter Type Telephone  Telephone Incoming Call  Treatment Phase Abnormal Scans  Barriers/Navigation Needs Coordination of Care  Interventions Coordination of Care  Coordination of Care Appts  Acuity Level 1  Time Spent with Patient 15   Referring office called and left me a vm message.  They have the same phone numbers we have.  Will keep trying to get patient

## 2015-03-30 ENCOUNTER — Telehealth: Payer: Self-pay | Admitting: *Deleted

## 2015-03-30 DIAGNOSIS — R918 Other nonspecific abnormal finding of lung field: Secondary | ICD-10-CM

## 2015-03-30 NOTE — Telephone Encounter (Signed)
Oncology Nurse Navigator Documentation  Oncology Nurse Navigator Flowsheets 03/30/2015  Navigator Encounter Type Telephone  Telephone Outgoing Call  Treatment Phase Abnormal Scans  Barriers/Navigation Needs Coordination of Care  Interventions Coordination of Care  Coordination of Care Appts  Acuity Level 1  Time Spent with Patient 15   Called and left vm message to call me with my name and phone number

## 2015-03-31 ENCOUNTER — Telehealth: Payer: Self-pay | Admitting: *Deleted

## 2015-03-31 NOTE — Telephone Encounter (Signed)
Called and confirmed 04/01/15 clinic appt w/ pt.

## 2015-04-01 ENCOUNTER — Ambulatory Visit (HOSPITAL_BASED_OUTPATIENT_CLINIC_OR_DEPARTMENT_OTHER): Payer: Self-pay | Admitting: Internal Medicine

## 2015-04-01 ENCOUNTER — Encounter: Payer: Self-pay | Admitting: Thoracic Surgery (Cardiothoracic Vascular Surgery)

## 2015-04-01 ENCOUNTER — Encounter: Payer: Self-pay | Admitting: *Deleted

## 2015-04-01 ENCOUNTER — Institutional Professional Consult (permissible substitution) (INDEPENDENT_AMBULATORY_CARE_PROVIDER_SITE_OTHER): Payer: Self-pay | Admitting: Thoracic Surgery (Cardiothoracic Vascular Surgery)

## 2015-04-01 ENCOUNTER — Ambulatory Visit: Payer: Self-pay | Attending: Internal Medicine | Admitting: Physical Therapy

## 2015-04-01 ENCOUNTER — Encounter: Payer: Self-pay | Admitting: Internal Medicine

## 2015-04-01 VITALS — BP 126/94 | HR 82 | Temp 98.2°F | Resp 17 | Ht 74.0 in | Wt 237.2 lb

## 2015-04-01 DIAGNOSIS — K611 Rectal abscess: Secondary | ICD-10-CM

## 2015-04-01 DIAGNOSIS — R918 Other nonspecific abnormal finding of lung field: Secondary | ICD-10-CM

## 2015-04-01 DIAGNOSIS — R590 Localized enlarged lymph nodes: Secondary | ICD-10-CM

## 2015-04-01 DIAGNOSIS — R0609 Other forms of dyspnea: Secondary | ICD-10-CM | POA: Insufficient documentation

## 2015-04-01 DIAGNOSIS — R5381 Other malaise: Secondary | ICD-10-CM | POA: Insufficient documentation

## 2015-04-01 DIAGNOSIS — R599 Enlarged lymph nodes, unspecified: Secondary | ICD-10-CM

## 2015-04-01 DIAGNOSIS — J984 Other disorders of lung: Secondary | ICD-10-CM

## 2015-04-01 DIAGNOSIS — Z720 Tobacco use: Secondary | ICD-10-CM

## 2015-04-01 MED ORDER — METRONIDAZOLE 500 MG PO TABS: 500.0000 mg | ORAL_TABLET | Freq: Three times a day (TID) | ORAL | Status: DC

## 2015-04-01 MED ORDER — LEVOFLOXACIN 500 MG PO TABS: 500.0000 mg | ORAL_TABLET | Freq: Every day | ORAL | Status: DC

## 2015-04-01 NOTE — Progress Notes (Signed)
PCP is Collene Mares, PA-C Referring Provider is Curt Bears, MD  No chief complaint on file.   HPI: Fernando [name] is sent for consultation re: a left lower lobe mass.  He is a 55 yo man with a history of tobacco abuse (1.5 ppd x 15 years, quit one month ago.). He also has a history of hypertension, some degree of CAD managed medically, cervical fusion, and multiple perirectal abscesses/ scrotal abscess. He recently was having perirectal pain and constipation. He was seen at [facility]. As preparation for possible surgery he had a CXR which showed an opacity in the left lower lobe. The perirectal inflammation was managed nonoperatively. He was treated with antibiotics.  He saw Dr. Luan Pulling and a CT showed nodular consolidation in the left lower lobe. A PET CT showed the area was hypermetabolic and there was a hypermetabolic lymph node in the left hilum.  He has had a cough, mostly nonproductive but he has seen blood on a couple of occassions. He denies headaches or visual changes. He does complain of chest tightness and shortness of breath with exertion that has worsened over the past 2-3 weeks, which he attributes to quitting smoking. He has lost 10 pounds in the past 3 months. He has been off antibiotics for over a week and the pain and inability to defecate have worsened over the past couple of days. He denies HA or visual changes. 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  Zubrod Score: At the time of surgery this patient's most appropriate activity status/level should be described as: []     0    Normal activity, no symptoms [x]     1    Restricted in physical strenuous activity but ambulatory, able to do out light work []     2    Ambulatory and capable of self care, unable to do work activities, up and about >50 % of waking hours                              []     3    Only limited self care, in bed greater than 50% of waking hours []     4    Completely  disabled, no self care, confined to bed or chair []     5    Moribund   Past Medical History  Diagnosis Date  . Hypertension 12/30/2011  . Allergy   . Peri-rectal abscess     multiple.   . Tobacco abuse     Past Surgical History  Procedure Laterality Date  . Cervical fusion    . Facial cosmetic surgery    . Knee arthroscopy    . Appendectomy    . Hernia repair    . Spine surgery    . Cystoscopy N/A 05/01/2013    Procedure: CYSTOSCOPY;  Surgeon: Marissa Nestle, MD;  Location: [facility];  Service: Urology;  Laterality: N/A;  . Incision and drainage abscess Right 05/01/2013    Procedure: INCISION AND DRAINAGE RIGHT SCROTAL ABSCESS;  Surgeon: Marissa Nestle, MD;  Location: [facility];  Service: Urology;  Laterality: Right;    Family History  Problem Relation Age of Onset  . Breast cancer Mother     Social History Social History  Substance Use Topics  . Smoking status: Current Every Day Smoker -- 1.50 packs/day for 15 years    Types: Cigarettes  . Smokeless  tobacco: None     Comment: Quit one month ago  . Alcohol Use: No     Comment: occ    Current Outpatient Prescriptions  Medication Sig Dispense Refill  . albuterol (PROVENTIL HFA;VENTOLIN HFA) 108 (90 Base) MCG/ACT inhaler Inhale 2 puffs into the lungs every 6 (six) hours as needed for wheezing or shortness of breath. 1 Inhaler 12  . ALPRAZolam (XANAX) 1 MG tablet Take 1 tablet (1 mg total) by mouth 3 (three) times daily as needed for anxiety. 30 tablet 0  . budesonide-formoterol (SYMBICORT) 80-4.5 MCG/ACT inhaler Inhale 2 puffs into the lungs 2 (two) times daily. 1 Inhaler 12  . HYDROcodone-acetaminophen (NORCO) 5-325 MG tablet Take 1 tablet by mouth every 6 (six) hours as needed for moderate pain. 30 tablet 0  . levofloxacin (LEVAQUIN) 500 MG tablet Take 1 tablet (500 mg total) by mouth daily. X 2 weeks 14 tablet 0  . lisinopril-hydrochlorothiazide (PRINZIDE,ZESTORETIC) 20-12.5 MG tablet Take 1 tablet by mouth daily. 30  tablet 3  . metroNIDAZOLE (FLAGYL) 500 MG tablet Take 1 tablet (500 mg total) by mouth 3 (three) times daily. X 2 weeks 42 tablet 0  . promethazine (PHENERGAN) 25 MG tablet Take 1 tablet (25 mg total) by mouth every 6 (six) hours as needed for nausea or vomiting. 30 tablet 0  . saccharomyces boulardii (FLORASTOR) 250 MG capsule Take 1 capsule (250 mg total) by mouth 2 (two) times daily. 60 capsule 0   No current facility-administered medications for this visit.    Allergies  Allergen Reactions  . Morphine And Related Other (See Comments)    hallucinations    Review of Systems  Constitutional: Positive for diaphoresis and fatigue. Negative for fever, chills and unexpected weight change.  HENT: Negative for trouble swallowing and voice change.   Eyes: Negative for visual disturbance.  Respiratory: Positive for cough and shortness of breath. Negative for wheezing.   Cardiovascular: Positive for chest pain.  Gastrointestinal: Positive for constipation and rectal pain. Negative for blood in stool.  Endocrine: Negative for polydipsia and polyuria.  Genitourinary: Positive for scrotal swelling. Negative for dysuria and difficulty urinating.  Musculoskeletal: Negative for arthralgias and gait problem.  Neurological: Negative for syncope, weakness and headaches.  Hematological: Negative for adenopathy. Does not bruise/bleed easily.  All other systems reviewed and are negative.   There were no vitals taken for this visit. Physical Exam  Constitutional: He is oriented to person, place, and time. He appears well-developed and well-nourished. No distress.  HENT:  Head: Normocephalic and atraumatic.  Eyes: Conjunctivae and EOM are normal. No scleral icterus.  Neck: Neck supple. No thyromegaly present.  Cardiovascular: Normal rate, regular rhythm, normal heart sounds and intact distal pulses.  Exam reveals no gallop and no friction rub.   No murmur heard. Pulmonary/Chest: Effort normal. He has  wheezes (faint right base). He has no rales.  Abdominal: Soft. There is no tenderness.  Musculoskeletal: He exhibits no edema.  Lymphadenopathy:    He has no cervical adenopathy.  Neurological: He is alert and oriented to person, place, and time. No cranial nerve deficit.  Motor intact  Skin: Skin is warm and dry.  Vitals reviewed.    Diagnostic Tests: CT CHEST, ABDOMEN, AND PELVIS WITH CONTRAST  TECHNIQUE: Multidetector CT imaging of the chest, abdomen and pelvis was performed following the standard protocol during bolus administration of intravenous contrast.  CONTRAST: 135mL OMNIPAQUE IOHEXOL 300 MG/ML SOLN  COMPARISON: CT scan abdomen and pelvis 03/07/2015 and CT of the  chest and abdomen 08/17/2009.  FINDINGS: CT CHEST  Sagittal images of the spine shows degenerative changes thoracic spine. Sagittal view of the sternum is unremarkable. Images of the thoracic inlet are unremarkable. Central airways are patent. No mediastinal hematoma or adenopathy. Heart size within normal limits. No pericardial effusion. There is no hilar adenopathy.  Central thoracic aorta and pulmonary artery is unremarkable. Mild centrilobular emphysematous changes are noted bilateral upper lobe.  As noted on recent CT scan of the abdomen again noted multilobular somewhat clustered nodular consolidation in left lower lobe centrally posteriorly infrahilar region. This is best seen in axial image 33 measures at least 3.3 by 2.8 cm. This is confirmed on coronal image 81. This is highly suspicious for malignancy. Further correlation with PET scan, bronchoscopy and/or biopsy is recommended. Minimal peripheral postobstructive changes noted in left lower lobe posterior see axial image 34. A pleural based nodule in left lower lobe measures 7 mm please see axial image 24. No destructive rib lesions are noted.  No segmental infiltrate or pulmonary edema. There is no pneumothorax.  CT  ABDOMEN AND PELVIS  Enhanced liver is unremarkable. No focal hepatic mass. No calcified gallstones are noted within gallbladder. The pancreas, spleen and adrenal glands are unremarkable.  Abdominal aorta is unremarkable. No aortic aneurysm.  Kidneys are symmetrical in size and enhancement. No hydronephrosis or hydroureter.  Delayed renal images shows bilateral renal symmetrical excretion. Bilateral visualized proximal ureter is unremarkable.  There is no small bowel obstruction. No ascites or free air. No adenopathy. No pericecal inflammation. The patient is status post appendectomy. The terminal ileum is unremarkable. Prostate gland and seminal vesicles are unremarkable. Again noted fat containing left inguinal scrotal canal hernia without evidence of acute complication.  There is a right inguinal lymph node measures 1.5 cm in short-axis. A left inguinal lymph node measures 1.7 cm in short-axis. These are borderline enlarged probable reactive.  Axial image 127 again noted inflammatory process surrounding the distal rectum and perianal region. Again noted small perianal fluid collection consistent with abscess measures about 3.2 by 1.3 by 1.4 cm.  Sagittal images of the spine shows again bilateral pars defect at L5 level. Stable facet degenerative changes L4 and L5 level.  IMPRESSION: 1. There is somewhat lobulated clustered nodular consolidation in left lower lobe centrally infrahilar region. This measures at least 3.3 x 2.8 cm. This is highly suspicious for malignancy. Further correlation with PET scan, bronchoscopy and/or biopsy is recommended. Small parenchymal postobstructive changes noted in left lower lobe posterior to the lesion, please see axial image 35. A pleural based nodule in left lower lobe posteriorly measures 7 mm. 2. Mild emphysematous changes bilateral upper lobe. 3. No mediastinal or hilar adenopathy. 4. Again noted perirectal and perianal  inflammatory changes with a perianal abscess measures 3.2 x 1.3 x 1.4 cm without significant change from prior exam. No air-fluid level is noted within abscess. 5. No hydronephrosis or hydroureter. 6. Status post appendectomy. No pericecal inflammation. Unremarkable terminal ileum. 7. No small bowel obstruction. 8. Borderline bilateral inguinal lymph nodes probable reactive. 9. Stable degenerative changes lumbar spine.   Electronically Signed  By: Lahoma Crocker M.D.  On: 03/10/2015 13:10        NUCLEAR MEDICINE PET SKULL BASE TO THIGH  TECHNIQUE: 12.45 mCi F-18 FDG was injected intravenously. Full-ring PET imaging was performed from the skull base to thigh after the radiotracer. CT data was obtained and used for attenuation correction and anatomic localization.  FASTING BLOOD GLUCOSE: Value: 94 mg/dl  COMPARISON: CT the chest, abdomen and pelvis 03/10/2015.  FINDINGS: NECK  Enlargement and hypermetabolism (SUVmax = 12.6)in the region of the pharyngeal tonsils bilaterally, favored to be physiologic, presumably related to underlying infection. No suspicious-appearing hypermetabolic lymph nodes in the neck.  CHEST  Poorly defined macrolobulated mass in the central aspect of the basal segments of the left lower lobe (image 84 of series 4) estimated to measure approximately 3.7 x 2.7 cm demonstrates hypermetabolism (SUVmax = 13.5), highly concerning for primary bronchogenic malignancy. Hypermetabolic (SUVmax = A999333 tissue in the left hilar region likely reflects underlying lymphadenopathy, although this is not measurable on today's noncontrast CT images. No hypermetabolic mediastinal or right hilar lymphadenopathy noted. No other suspicious appearing pulmonary nodules or masses are noted. No acute consolidative airspace disease. No pleural effusions. There are several areas of low-level hypermetabolism in the mid to lower esophagus, most notably just  before the gastroesophageal junction (SUVmax = 5.5)where there appears to be some mild asymmetric mural thickening (image 93 of series 4). Heart size is normal. There is no significant pericardial fluid, thickening or pericardial calcification. There is atherosclerosis of the thoracic aorta, the great vessels of the mediastinum and the coronary arteries, including calcified atherosclerotic plaque in the left anterior descending coronary artery. Mild calcifications of the mitral annulus.  ABDOMEN/PELVIS  No abnormal hypermetabolic activity within the liver, pancreas, adrenal glands, or spleen. No hypermetabolic lymph nodes in the abdomen. No significant volume of ascites. No pneumoperitoneum. No pathologic distention of small bowel or colon. There is again some asymmetric soft tissue prominence posterior to the anorectal junction (image 205 of series 4) where there is a mass-like area that demonstrates extensive hypermetabolism (SUVmax = 232.9), corresponding to the perianal abscess noted on prior examinations. There are borderline enlarged and mildly enlarged external iliac and inguinal lymph nodes bilaterally measuring up to 14 mm in short axis in the left inguinal region, demonstrating hypermetabolism (SUVmax = 4.4-6.4).  SKELETON  No focal hypermetabolic activity to suggest skeletal metastasis.  IMPRESSION: 1. 3.7 x 2.7 cm left lower lobe hypermetabolic mass with hypermetabolic left hilar nodal tissue, concerning for primary bronchogenic malignancy with left hilar nodal involvement. No definite evidence of metastatic disease elsewhere in the neck, chest, abdomen or pelvis. This lesion should be accessible by bronchoscopy for biopsy for further diagnostic and staging purposes. 2. Extensive mass-like soft tissue thickening and hypermetabolism in the posterior perianal region, corresponding to the previously diagnosed perianal abscess. Lymphadenopathy in the pelvis  is presumably reactive. 3. There is also some enlargement and hypermetabolism in the region of the pharyngeal tonsils bilaterally which is relatively symmetric, favored to be physiologic, likely related under low acute infection. Clinical correlation is recommended. 4. Atherosclerosis, including left anterior descending coronary artery disease. Please note that although the presence of coronary artery calcium documents the presence of coronary artery disease, the severity of this disease and any potential stenosis cannot be assessed on this non-gated CT examination. Assessment for potential risk factor modification, dietary therapy or pharmacologic therapy may be warranted, if clinically indicated. 5. Additional incidental findings, as above.   Electronically Signed  By: Vinnie Langton M.D.  On: 03/16/2015 10:35  I personally reviewed the CT and PET CT and concur with the findings noted above  Impression:  55 yo man with a modest smoking history who was found to have a left lower lobe mass/ consolidation while being worked up for perirectal issues. He was having a cough and some hemoptysis as well, but those were not his  primary complaints.  He has a 3.7 x 2.7 cm mass in the left lower lobe with a hilar node that is hypermetabolic as well. 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 This has to be considered lung cancer unless it can be proven otherwise. Other considerations in the differential diagnosis include infectious or inflammatory conditions.  He has several issues to deal with. His primary complaints remain rectal pain and difficulty defecating. He says that has worsened significantly in the past week. I will restart his antibiotics (levofloxacin and metronidazole) until he can have that re-evaluated.   He also is having significant chest tightness and shortness of breath with exertion over the past couple of weeks. He attributes it to quitting  smoking, but it sounds anginal in nature. He has had a cath in the past which showed some CAD but was treated medically. He has not seen a Cardiologist in many years, so I think should see one prior to elective surgery.  I think the best initial option for Fernando [name] is a English as a second language teacher and EBUS. Hopefully, that will give [place] a definitive diagnosis. The left lower lobe lesion would require a lobectomy to remove so I think we need to at least attempt to biopsy it before proceeding with surgery.  He will need PFTs with and without bronchodilators prior to any resection.  After he is seen by Cardiology, we will plan to proceed with Bronchoscopy and EBUS. I discussed the general nature of the procedure with him. He understands it is an outpatient procedure, done in the OR under general anesthesia. He understands it is diagnostic and not therapeutic. He understands we may not be able to get a definitive diagnosis. I informed him of the indications, risks, benefits and alternatives. He understands the risks include those of general anesthesia, as well as bleeding, pneumothorax, failure to make a diagnosis, and the possibility of other unforeseeable complications.   He accepts the risks and wishes to proceed.  Plan:  Restart Levaquin and Flagyl PFTs Cardiology evaluation Bronchoscopy and EBUS to be scheduled once cleared by Cardiology  Melrose Nakayama, MD [facility] [phone]

## 2015-04-01 NOTE — Progress Notes (Signed)
Oncology Nurse Navigator Documentation  Oncology Nurse Navigator Flowsheets 04/01/2015  Navigator Encounter Type Clinic/MDC  Abnormal Finding Date 03/07/2015  Treatment Phase Abnormal Scans  Barriers/Navigation Needs Coordination of Care;Education  Education Other  Interventions Coordination of Care  Coordination of Care Appts  Acuity Level 1  Acuity Level 1 Initial guidance, education and coordination as needed  Time Spent with Patient 77   Spoke with patient and family today.  Patient is going to get work up before surgery.  He needs to be set up with cardiology, general surgery, and get PFT's.  Patient is aware of next steps.

## 2015-04-01 NOTE — Therapy (Signed)
[place], [place], [address] Phone: [phone]   Fax:  [phone]  Physical Therapy Evaluation  Patient Details  Name: Fernando [name] MRN: [id number] Date of Birth: 1960-09-20 Referring Provider: Dr. Curt Bears  Encounter Date: 04/01/2015      PT End of Session - 04/01/15 2017    Visit Number 1   Number of Visits 1   PT Start Time 1600   PT Stop Time 1623   PT Time Calculation (min) 23 min   Activity Tolerance Patient tolerated treatment well   Behavior During Therapy [facility] for tasks assessed/performed      Past Medical History  Diagnosis Date  . Hypertension 12/30/2011  . Allergy   . Peri-rectal abscess     multiple.   . Tobacco abuse     Past Surgical History  Procedure Laterality Date  . Cervical fusion    . Facial cosmetic surgery    . Knee arthroscopy    . Appendectomy    . Hernia repair    . Spine surgery    . Cystoscopy N/A 05/01/2013    Procedure: CYSTOSCOPY;  Surgeon: Marissa Nestle, MD;  Location: [facility];  Service: Urology;  Laterality: N/A;  . Incision and drainage abscess Right 05/01/2013    Procedure: INCISION AND DRAINAGE RIGHT SCROTAL ABSCESS;  Surgeon: Marissa Nestle, MD;  Location: [facility];  Service: Urology;  Laterality: Right;    There were no vitals filed for this visit.  Visit Diagnosis:  Dyspnea on exertion - Plan: PT plan of care cert/re-cert  Physical deconditioning - Plan: PT plan of care cert/re-cert      Subjective Assessment - 04/01/15 2005    Subjective reports rectal pain; says he quit smoking one  month ago   Patient is accompained by: Family member  mother   Pertinent History Incidental finding of left lung mass on CT abdomen when being worked up for rectal pain.  No tissue diagnosis yet, so needs that as well as brain MR, then may have resection, chemo, and/or possibly radiation.  h/o cervical fusion, knee arthroscopy, HTN.   Patient  Stated Goals get info from all lung clinic providers   Currently in Pain? Yes   Pain Score --  declined to rate   Pain Location Rectum   Pain Descriptors / Indicators Sharp   Aggravating Factors  certain movements   Pain Relieving Factors certain positions            [facility] PT Assessment - 04/01/15 0001    Assessment   Medical Diagnosis left lung mass; no pathology to date   Referring Provider Dr. Curt Bears   Onset Date/Surgical Date 03/07/15   Precautions   Precautions Other (comment)   Precaution Comments cancer precautions   Restrictions   Weight Bearing Restrictions No   Balance Screen   Has the patient fallen in the past 6 months No   Has the patient had a decrease in activity level because of a fear of falling?  No   Is the patient reluctant to leave their home because of a fear of falling?  No   Home Environment   Living Environment Private residence   Living Arrangements Parent   Type of [place] Two level   Prior Function   Level of Independence Independent   Leisure no regular exercise; does household chores   Cognition   Overall Cognitive Status Within Functional Limits for tasks  assessed   Functional Tests   Functional tests Sit to Stand   Sit to Stand   Comments 9 times in 30 seconds,below average for age  and with mild dyspnea after this   Posture/Postural Control   Posture/Postural Control No significant limitations   Posture Comments stands and sits very erect   ROM / Strength   AROM / PROM / Strength AROM   AROM   Overall AROM Comments standing trunk AROM WFL all directions   Ambulation/Gait   Ambulation/Gait Yes   Ambulation/Gait Assistance 7: Independent  but reports SOB with distance   Balance   Balance Assessed Yes   Dynamic Standing Balance   Dynamic Standing - Comments reaches 16 inches forward in standing (good)                           PT Education - 04/01/15 2016    Education provided Yes    Education Details posture, breathing, energy conservation, walking, Cure article on staying active, PT info   Person(s) Educated Patient;Parent(s)   Methods Explanation;Handout   Comprehension Verbalized understanding               Lung Clinic Goals - 04/01/15 2020    Patient will be able to verbalize understanding of the benefit of exercise to decrease fatigue.   Status Achieved   Patient will be able to verbalize the importance of posture.   Status Achieved   Patient will be able to demonstrate diaphragmatic breathing for improved lung function.   Status Achieved   Patient will be able to verbalize understanding of the role of physical therapy to prevent functional decline and who to contact if physical therapy is needed.   Status Achieved             Plan - 04/01/15 2017    Clinical Impression Statement Patient with incidental finding of lung mass will have biopsy done for diagnosis, then may be treated with resection, chemo, or possibly radiation.  He does have some dyspnea with exertion and scored low on 30 second sit to/from stand for a man of his age.   Pt will benefit from skilled therapeutic intervention in order to improve on the following deficits Cardiopulmonary status limiting activity   Rehab Potential Good   PT Frequency One time visit   PT Treatment/Interventions Patient/family education   PT Next Visit Plan None at this time.   PT Home Exercise Plan See education section.   Consulted and Agree with Plan of Care Patient;Family member/caregiver         Problem List Patient Active Problem List   Diagnosis Date Noted  . Lung mass 03/30/2015  . Peri-rectal abscess 03/07/2015  . Abnormal CXR 03/07/2015  . Scrotal wall abscess 05/01/2013  . Cellulitis 05/01/2013  . Hematuria 04/30/2013  . Cellulitis of scrotum 04/29/2013  . Tick bite 04/29/2013  . Hypertension 12/30/2011    FernandoDONNA 04/01/2015, 8:22 PM  [place], [place], [address] Phone: [phone]   Fax:  [phone]  Name: Fernando [name] MRN: [id number] Date of Birth: 12/08/1960   Serafina Royals, PT 04/01/2015 8:22 PM

## 2015-04-01 NOTE — Progress Notes (Signed)
[place] Telephone:([phone]   Fax:([phone]) [phone] Multidisciplinary thoracic oncology clinic  CONSULT NOTE  REFERRING PHYSICIAN: Dr. Sinda Du  REASON FOR CONSULTATION:  55 years old white male with questionable lung cancer.  HPI Fernando [name] is a 55 y.o. male with past medical history significant for hypertension, status post cervical fusion, history of perirectal abscess as well as long history of smoking. The patient used to work as a Administrator and he has a history of perirectal abscess several years ago. He presented to the emergency Department at [facility] on 03/07/2015 complaining of rectal pain. CT of the abdomen and pelvis at that time showed perirectal and perianal inflammation with a small perianal abscesses. The scan also showed airspace consolidation versus mass involving the visualized left lower lobe. This was followed by CT scan of the chest on 03/10/2015 and it showed multilobular somewhat clustered nodular consolidation in left lower lobe centrally posteriorly infrahilar region measures at least 3.3 by 2.8 cm. This is highly suspicious for malignancy. There is no mediastinal or hilar adenopathy. There is a pleural based nodule in the left lower lobe posteriorly measured 0.7 cm. The patient was seen by Dr. Luan Pulling and a PET scan was performed on 03/16/2015 and it showed 3.7 x 2.7 cm left lower lobe hypermetabolic mass with hypermetabolic left hilar nodal tissue, concerning for primary bronchogenic malignancy with left hilar nodal involvement. No definite evidence of metastatic disease elsewhere in the neck, chest, abdomen or pelvis. The patient was seen during his hospitalization. Dr. Arnoldo Morale for evaluation of the perirectal abscess. He was treated with a course of antibiotics. Dr. Luan Pulling kindly referred the patient to the multicentric by thoracic oncology clinic today for evaluation and recommendation regarding treatment of his  questionable lung cancer. He continues to have significant pain in the rectal area. He denied having any significant chest pain but has shortness breath with exertion and mild cough with occasional production of dark sputum. He quit smoking and months ago. He lost around 10 pounds recently. The patient has no headache or visual changes. He has occasional nausea but no vomiting. Family history significant for a mother and 2 aunts with breast cancer. The patient is single and has 2 sons. He was accompanied today by his mother Fernando [name]. Used to work as a Administrator. He has a history of smoking up to 1.5 pack per day for 15 years and quit 1 month ago. He also drinks alcohol at regular basis and no history of drug abuse.  HPI  Past Medical History  Diagnosis Date  . Hypertension 12/30/2011  . Allergy   . Peri-rectal abscess     Past Surgical History  Procedure Laterality Date  . Cervical fusion    . Facial cosmetic surgery    . Knee arthroscopy    . Appendectomy    . Hernia repair    . Spine surgery    . Cystoscopy N/A 05/01/2013    Procedure: CYSTOSCOPY;  Surgeon: Marissa Nestle, MD;  Location: [facility];  Service: Urology;  Laterality: N/A;  . Incision and drainage abscess Right 05/01/2013    Procedure: INCISION AND DRAINAGE RIGHT SCROTAL ABSCESS;  Surgeon: Marissa Nestle, MD;  Location: [facility];  Service: Urology;  Laterality: Right;    Family History  Problem Relation Age of Onset  . Breast cancer Mother     Social History Social History  Substance Use Topics  . Smoking status: Current Every Day Smoker --  0.50 packs/day for 8 years    Types: Cigarettes  . Smokeless tobacco: Not on file  . Alcohol Use: No     Comment: occ    Allergies  Allergen Reactions  . Morphine And Related Other (See Comments)    hallucinations    Current Outpatient Prescriptions  Medication Sig Dispense Refill  . albuterol (PROVENTIL HFA;VENTOLIN HFA) 108 (90 Base) MCG/ACT inhaler Inhale 2  puffs into the lungs every 6 (six) hours as needed for wheezing or shortness of breath. 1 Inhaler 12  . ALPRAZolam (XANAX) 1 MG tablet Take 1 tablet (1 mg total) by mouth 3 (three) times daily as needed for anxiety. 30 tablet 0  . budesonide-formoterol (SYMBICORT) 80-4.5 MCG/ACT inhaler Inhale 2 puffs into the lungs 2 (two) times daily. 1 Inhaler 12  . HYDROcodone-acetaminophen (NORCO) 5-325 MG tablet Take 1 tablet by mouth every 6 (six) hours as needed for moderate pain. 30 tablet 0  . levofloxacin (LEVAQUIN) 500 MG tablet Take 1 tablet (500 mg total) by mouth daily. X 2 weeks 14 tablet 0  . lisinopril-hydrochlorothiazide (PRINZIDE,ZESTORETIC) 20-12.5 MG tablet Take 1 tablet by mouth daily. 30 tablet 3  . metroNIDAZOLE (FLAGYL) 500 MG tablet Take 1 tablet (500 mg total) by mouth 3 (three) times daily. X 2 weeks 42 tablet 0  . promethazine (PHENERGAN) 25 MG tablet Take 1 tablet (25 mg total) by mouth every 6 (six) hours as needed for nausea or vomiting. 30 tablet 0  . saccharomyces boulardii (FLORASTOR) 250 MG capsule Take 1 capsule (250 mg total) by mouth 2 (two) times daily. 60 capsule 0   No current facility-administered medications for this visit.    Review of Systems  Constitutional: negative Eyes: negative Ears, nose, mouth, throat, and face: negative Respiratory: positive for cough, dyspnea on exertion and sputum Cardiovascular: negative Gastrointestinal: positive for Rectal pain Genitourinary:negative Integument/breast: negative Hematologic/lymphatic: negative Musculoskeletal:negative Neurological: negative Behavioral/Psych: negative Endocrine: negative Allergic/Immunologic: negative  Physical Exam  [id number], healthy, no distress, well nourished and well developed SKIN: skin color, texture, turgor are normal, no rashes or significant lesions HEAD: Normocephalic, No masses, lesions, tenderness or abnormalities EYES: normal, PERRLA, Conjunctiva are pink and  non-injected EARS: External ears normal, Canals clear OROPHARYNX:no exudate, no erythema and lips, buccal mucosa, and tongue normal  NECK: supple, no adenopathy, no JVD LYMPH:  no palpable lymphadenopathy, no hepatosplenomegaly LUNGS: clear to auscultation , and palpation HEART: regular rate & rhythm, no murmurs and no gallops ABDOMEN:abdomen soft, non-tender, normal bowel sounds and no masses or organomegaly BACK: Back symmetric, no curvature., No CVA tenderness EXTREMITIES:no joint deformities, effusion, or inflammation, no edema, no skin discoloration  NEURO: alert & oriented x 3 with fluent speech, no focal motor/sensory deficits  PERFORMANCE STATUS: ECOG 1  LABORATORY DATA: Lab Results  Component Value Date   WBC 6.5 03/12/2015   HGB 14.0 03/12/2015   HCT 41.1 03/12/2015   MCV 95.1 03/12/2015   PLT 277 03/12/2015      Chemistry      Component Value Date/Time   NA 136 03/12/2015 0621   K 3.5 03/12/2015 0621   CL 103 03/12/2015 0621   CO2 25 03/12/2015 0621   BUN 11 03/12/2015 0621   CREATININE 1.06 03/12/2015 0621      Component Value Date/Time   CALCIUM 8.3* 03/12/2015 0621   ALKPHOS 89 03/07/2015 1619   AST 21 03/07/2015 1619   ALT 20 03/07/2015 1619   BILITOT 0.2* 03/07/2015 1619       RADIOGRAPHIC  STUDIES: Dg Chest 2 View  03/07/2015  CLINICAL DATA:  Patient with airspace opacities on recent CT. EXAM: CHEST  2 VIEW COMPARISON:  Chest radiograph 11/10/2014 FINDINGS: Anterior cervical spinal fusion hardware. Stable cardiomegaly and tortuosity of the thoracic aorta. Focal consolidation is demonstrated within the left lower lobe. Additionally consolidative opacities are demonstrated within the peripheral right mid lung. No pleural effusion or pneumothorax. Mid thoracic spine degenerative changes. IMPRESSION: Right mid lung and left lower lobe consolidative opacities which are nonspecific however may represent infection in the appropriate clinical setting. Additional  consideration of pulmonary mass is not excluded. Followup PA and lateral chest X-ray is recommended in 3-4 weeks following trial of antibiotic therapy to ensure resolution and exclude underlying malignancy. Electronically Signed   By: Lovey Newcomer M.D.   On: 03/07/2015 19:02   Ct Chest W Contrast  03/10/2015  CLINICAL DATA:  Perirectal access, rectal pain, possible mass or focal airspace disease left lower lobe. EXAM: CT CHEST, ABDOMEN, AND PELVIS WITH CONTRAST TECHNIQUE: Multidetector CT imaging of the chest, abdomen and pelvis was performed following the standard protocol during bolus administration of intravenous contrast. CONTRAST:  157mL OMNIPAQUE IOHEXOL 300 MG/ML  SOLN COMPARISON:  CT scan abdomen and pelvis 03/07/2015 and CT of the chest and abdomen 08/17/2009. FINDINGS: CT CHEST Sagittal images of the spine shows degenerative changes thoracic spine. Sagittal view of the sternum is unremarkable. Images of the thoracic inlet are unremarkable. Central airways are patent. No mediastinal hematoma or adenopathy. Heart size within normal limits. No pericardial effusion. There is no hilar adenopathy. Central thoracic aorta and pulmonary artery is unremarkable. Mild centrilobular emphysematous changes are noted bilateral upper lobe. As noted on recent CT scan of the abdomen again noted multilobular somewhat clustered nodular consolidation in left lower lobe centrally posteriorly infrahilar region. This is best seen in axial image 33 measures at least 3.3 by 2.8 cm. This is confirmed on coronal image 81. This is highly suspicious for malignancy. Further correlation with PET scan, bronchoscopy and/or biopsy is recommended. Minimal peripheral postobstructive changes noted in left lower lobe posterior see axial image 34. A pleural based nodule in left lower lobe measures 7 mm please see axial image 24. No destructive rib lesions are noted. No segmental infiltrate or pulmonary edema. There is no pneumothorax. CT ABDOMEN  AND PELVIS Enhanced liver is unremarkable. No focal hepatic mass. No calcified gallstones are noted within gallbladder. The pancreas, spleen and adrenal glands are unremarkable. Abdominal aorta is unremarkable.  No aortic aneurysm. Kidneys are symmetrical in size and enhancement. No hydronephrosis or hydroureter. Delayed renal images shows bilateral renal symmetrical excretion. Bilateral visualized proximal ureter is unremarkable. There is no small bowel obstruction. No ascites or free air. No adenopathy. No pericecal inflammation. The patient is status post appendectomy. The terminal ileum is unremarkable. Prostate gland and seminal vesicles are unremarkable. Again noted fat containing left inguinal scrotal canal hernia without evidence of acute complication. There is a right inguinal lymph node measures 1.5 cm in short-axis. A left inguinal lymph node measures 1.7 cm in short-axis. These are borderline enlarged probable reactive. Axial image 127 again noted inflammatory process surrounding the distal rectum and perianal region. Again noted small perianal fluid collection consistent with abscess measures about 3.2 by 1.3 by 1.4 cm. Sagittal images of the spine shows again bilateral pars defect at L5 level. Stable facet degenerative changes L4 and L5 level. IMPRESSION: 1. There is somewhat lobulated clustered nodular consolidation in left lower lobe centrally infrahilar region. This  measures at least 3.3 x 2.8 cm. This is highly suspicious for malignancy. Further correlation with PET scan, bronchoscopy and/or biopsy is recommended. Small parenchymal postobstructive changes noted in left lower lobe posterior to the lesion, please see axial image 35. A pleural based nodule in left lower lobe posteriorly measures 7 mm. 2. Mild emphysematous changes bilateral upper lobe. 3. No mediastinal or hilar adenopathy. 4. Again noted perirectal and perianal inflammatory changes with a perianal abscess measures 3.2 x 1.3 x 1.4 cm  without significant change from prior exam. No air-fluid level is noted within abscess. 5. No hydronephrosis or hydroureter. 6. Status post appendectomy. No pericecal inflammation. Unremarkable terminal ileum. 7. No small bowel obstruction. 8. Borderline bilateral inguinal lymph nodes probable reactive. 9. Stable degenerative changes lumbar spine. Electronically Signed   By: Lahoma Crocker M.D.   On: 03/10/2015 13:10   Ct Abdomen Pelvis W Contrast  03/10/2015  CLINICAL DATA:  Perirectal access, rectal pain, possible mass or focal airspace disease left lower lobe. EXAM: CT CHEST, ABDOMEN, AND PELVIS WITH CONTRAST TECHNIQUE: Multidetector CT imaging of the chest, abdomen and pelvis was performed following the standard protocol during bolus administration of intravenous contrast. CONTRAST:  166mL OMNIPAQUE IOHEXOL 300 MG/ML  SOLN COMPARISON:  CT scan abdomen and pelvis 03/07/2015 and CT of the chest and abdomen 08/17/2009. FINDINGS: CT CHEST Sagittal images of the spine shows degenerative changes thoracic spine. Sagittal view of the sternum is unremarkable. Images of the thoracic inlet are unremarkable. Central airways are patent. No mediastinal hematoma or adenopathy. Heart size within normal limits. No pericardial effusion. There is no hilar adenopathy. Central thoracic aorta and pulmonary artery is unremarkable. Mild centrilobular emphysematous changes are noted bilateral upper lobe. As noted on recent CT scan of the abdomen again noted multilobular somewhat clustered nodular consolidation in left lower lobe centrally posteriorly infrahilar region. This is best seen in axial image 33 measures at least 3.3 by 2.8 cm. This is confirmed on coronal image 81. This is highly suspicious for malignancy. Further correlation with PET scan, bronchoscopy and/or biopsy is recommended. Minimal peripheral postobstructive changes noted in left lower lobe posterior see axial image 34. A pleural based nodule in left lower lobe measures  7 mm please see axial image 24. No destructive rib lesions are noted. No segmental infiltrate or pulmonary edema. There is no pneumothorax. CT ABDOMEN AND PELVIS Enhanced liver is unremarkable. No focal hepatic mass. No calcified gallstones are noted within gallbladder. The pancreas, spleen and adrenal glands are unremarkable. Abdominal aorta is unremarkable.  No aortic aneurysm. Kidneys are symmetrical in size and enhancement. No hydronephrosis or hydroureter. Delayed renal images shows bilateral renal symmetrical excretion. Bilateral visualized proximal ureter is unremarkable. There is no small bowel obstruction. No ascites or free air. No adenopathy. No pericecal inflammation. The patient is status post appendectomy. The terminal ileum is unremarkable. Prostate gland and seminal vesicles are unremarkable. Again noted fat containing left inguinal scrotal canal hernia without evidence of acute complication. There is a right inguinal lymph node measures 1.5 cm in short-axis. A left inguinal lymph node measures 1.7 cm in short-axis. These are borderline enlarged probable reactive. Axial image 127 again noted inflammatory process surrounding the distal rectum and perianal region. Again noted small perianal fluid collection consistent with abscess measures about 3.2 by 1.3 by 1.4 cm. Sagittal images of the spine shows again bilateral pars defect at L5 level. Stable facet degenerative changes L4 and L5 level. IMPRESSION: 1. There is somewhat lobulated clustered nodular consolidation  in left lower lobe centrally infrahilar region. This measures at least 3.3 x 2.8 cm. This is highly suspicious for malignancy. Further correlation with PET scan, bronchoscopy and/or biopsy is recommended. Small parenchymal postobstructive changes noted in left lower lobe posterior to the lesion, please see axial image 35. A pleural based nodule in left lower lobe posteriorly measures 7 mm. 2. Mild emphysematous changes bilateral upper lobe.  3. No mediastinal or hilar adenopathy. 4. Again noted perirectal and perianal inflammatory changes with a perianal abscess measures 3.2 x 1.3 x 1.4 cm without significant change from prior exam. No air-fluid level is noted within abscess. 5. No hydronephrosis or hydroureter. 6. Status post appendectomy. No pericecal inflammation. Unremarkable terminal ileum. 7. No small bowel obstruction. 8. Borderline bilateral inguinal lymph nodes probable reactive. 9. Stable degenerative changes lumbar spine. Electronically Signed   By: Lahoma Crocker M.D.   On: 03/10/2015 13:10   Ct Abdomen Pelvis W Contrast  03/07/2015  CLINICAL DATA:  55 year old presenting with a 3 week history of rectal pain. Personal history of perirectal abscess requiring surgical drainage. Surgical history also includes appendectomy and unspecified hernia repair. EXAM: CT ABDOMEN AND PELVIS WITH CONTRAST TECHNIQUE: Multidetector CT imaging of the abdomen and pelvis was performed using the standard protocol following bolus administration of intravenous contrast. CONTRAST:  134mL OMNIPAQUE IOHEXOL 300 MG/ML IV. Oral contrast was also administered. COMPARISON:  04/29/2013, 08/17/2009, 10/30/2006, 09/16/2006. FINDINGS: Lower chest: Mass or focal airspace consolidation involving the left lower lobe, incompletely imaged. Heart size upper normal. Hepatobiliary: Liver normal in size and appearance. Gallbladder normal in appearance without calcified gallstones. No biliary ductal dilation. Pancreas: Normal in appearance without evidence of mass, ductal dilation, or inflammation. Spleen: Normal in size and appearance. Adrenals/Urinary Tract: Normal appearing adrenal glands. Kidneys normal in size and appearance without focal parenchymal abnormality. No evidence of urinary tract calculi or obstruction. Normal-appearing urinary bladder. Stomach/Bowel: Normal-appearing stomach filled with food. Normal-appearing small bowel. Numerous small bowel loops lateral to the  ascending colon, a new finding. Edema/inflammation surrounding the distal rectum, extending into the perirectal and perianal tissues below the anal verge. Approximate 2.9 x 1.3 x 1.7 cm fluid collection in the perianal region. Remainder of the colon decompressed and unremarkable. Appendix surgically absent. Vascular/Lymphatic: Mild iliofemoral atherosclerosis. Patent visceral arteries. Normal-appearing portal venous and systemic venous systems. Scattered normal sized retroperitoneal lymph nodes. No pathologic lymphadenopathy. Reproductive: Mild median lobe prostate gland enlargement as noted previously. Normal seminal vesicles. Other: None. 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 Facet degenerative changes at L4-5 and L5-S1. No acute abnormality. IMPRESSION: 1. Perirectal and perianal inflammation with a small perianal abscess measured above. 2. Small bowel loops lateral to the ascending colon which may indicate an internal hernia. 3. Airspace consolidation versus mass involving the visualized left lower lobe, incompletely imaged on the current CT. 4. Stable mild median lobe prostate gland enlargement (likely BPH). Electronically Signed   By: Evangeline Dakin M.D.   On: 03/07/2015 18:02   Nm Pet Image Initial (pi) Skull Base To Thigh  03/16/2015  CLINICAL DATA:  Initial treatment strategy for lung lesion. EXAM: NUCLEAR MEDICINE PET SKULL BASE TO THIGH TECHNIQUE: 12.45 mCi F-18 FDG was injected intravenously. Full-ring PET imaging was performed from the skull base to thigh after the radiotracer. CT data was obtained and used for attenuation correction and anatomic localization. FASTING BLOOD GLUCOSE:  Value: 94 mg/dl COMPARISON:  CT the chest,  abdomen and pelvis 03/10/2015. FINDINGS: NECK Enlargement and hypermetabolism (SUVmax = 12.6)in the region of the pharyngeal tonsils bilaterally, favored to  be physiologic, presumably related to underlying infection. No suspicious-appearing hypermetabolic lymph nodes in the neck. CHEST Poorly defined macrolobulated mass in the central aspect of the basal segments of the left lower lobe (image 84 of series 4) estimated to measure approximately 3.7 x 2.7 cm demonstrates hypermetabolism (SUVmax = 13.5), highly concerning for primary bronchogenic malignancy. Hypermetabolic (SUVmax = A999333 tissue in the left hilar region likely reflects underlying lymphadenopathy, although this is not measurable on today's noncontrast CT images. No hypermetabolic mediastinal or right hilar lymphadenopathy noted. No other suspicious appearing pulmonary nodules or masses are noted. No acute consolidative airspace disease. No pleural effusions. There are several areas of low-level hypermetabolism in the mid to lower esophagus, most notably just before the gastroesophageal junction (SUVmax = 5.5)where there appears to be some mild asymmetric mural thickening (image 93 of series 4). Heart size is normal. There is no significant pericardial fluid, thickening or pericardial calcification. There is atherosclerosis of the thoracic aorta, the great vessels of the mediastinum and the coronary arteries, including calcified atherosclerotic plaque in the left anterior descending coronary artery. Mild calcifications of the mitral annulus. ABDOMEN/PELVIS No abnormal hypermetabolic activity within the liver, pancreas, adrenal glands, or spleen. No hypermetabolic lymph nodes in the abdomen. No significant volume of ascites. No pneumoperitoneum. No pathologic distention of small bowel or colon. There is again some asymmetric soft tissue prominence posterior to the anorectal junction (image 205 of series 4) where there is a mass-like area that demonstrates extensive hypermetabolism (SUVmax = 232.9), corresponding to the perianal abscess noted on prior examinations. There are borderline enlarged and mildly  enlarged external iliac and inguinal lymph nodes bilaterally measuring up to 14 mm in short axis in the left inguinal region, demonstrating hypermetabolism (SUVmax = 4.4-6.4). SKELETON No focal hypermetabolic activity to suggest skeletal metastasis. IMPRESSION: 1. 3.7 x 2.7 cm left lower lobe hypermetabolic mass with hypermetabolic left hilar nodal tissue, concerning for primary bronchogenic malignancy with left hilar nodal involvement. No definite evidence of metastatic disease elsewhere in the neck, chest, abdomen or pelvis. This lesion should be accessible by bronchoscopy for biopsy for further diagnostic and staging purposes. 2. Extensive mass-like soft tissue thickening and hypermetabolism in the posterior perianal region, corresponding to the previously diagnosed perianal abscess. Lymphadenopathy in the pelvis is presumably reactive. 3. There is also some enlargement and hypermetabolism in the region of the pharyngeal tonsils bilaterally which is relatively symmetric, favored to be physiologic, likely related under low acute infection. Clinical correlation is recommended. 4. Atherosclerosis, including left anterior descending coronary artery disease. Please note that although the presence of coronary artery calcium documents the presence of coronary artery disease, the severity of this disease and any potential stenosis cannot be assessed on this non-gated CT examination. Assessment for potential risk factor modification, dietary therapy or pharmacologic therapy may be warranted, if clinically indicated. 5. Additional incidental findings, as above. Electronically Signed   By: Vinnie Langton M.D.   On: 03/16/2015 10:35    ASSESSMENT: This is a very pleasant 55 years old white male with questionable stage IIB (T2a, N1, M0) lung cancer highly suspicious for non-small cell carcinoma presented with left lower lobe lung mass in addition to left hilar adenopathy. The patient also has a perirectal abscess that  has not been resolved at this point.   PLAN: I had a lengthy discussion with the patient and his mother  about his current disease stage, prognosis and treatment options. I will complete the staging workup by ordering a MRI of the brain to rule out brain metastasis. The patient will see Dr. Roxan Hockey later today for evaluation and consideration of surgical resection as he is a good surgical candidate. I will see the patient back for follow-up visit after his surgical resection for reevaluation and discussion of adjuvant therapy if needed. He was seen by Dr. Arnoldo Morale for the perirectal abscess but he has no scheduled follow-up visit with him for management of this condition. The patient is thinking about second opinion for his perirectal abscess. The patient was seen during the multidisciplinary thoracic oncology clinic today by medical oncology, thoracic surgery, thoracic navigator, social worker and physical therapist. He was advised to call if he has any concerning symptoms in the interval. The patient voices understanding of current disease status and treatment options and is in agreement with the current care plan.  All questions were answered. The patient knows to call the clinic with any problems, questions or concerns. We can certainly see the patient much sooner if necessary.  Thank you so much for allowing me to participate in the care of Fernando [name]. I will continue to follow up the patient with you and assist in his care.  I spent 40 minutes counseling the patient face to face. The total time spent in the appointment was 60 minutes.  Disclaimer: This note was dictated with voice recognition software. Similar sounding words can inadvertently be transcribed and may not be corrected upon review.   Fernando Richeson K. April 01, 2015, 4:04 PM

## 2015-04-02 ENCOUNTER — Other Ambulatory Visit: Payer: Self-pay | Admitting: *Deleted

## 2015-04-02 DIAGNOSIS — R918 Other nonspecific abnormal finding of lung field: Secondary | ICD-10-CM

## 2015-04-05 ENCOUNTER — Ambulatory Visit (HOSPITAL_COMMUNITY)
Admission: RE | Admit: 2015-04-05 | Discharge: 2015-04-05 | Disposition: A | Discharge status: Home / Self Care | Payer: Self-pay | Source: Ambulatory Visit | Attending: Thoracic Surgery (Cardiothoracic Vascular Surgery) | Admitting: Thoracic Surgery (Cardiothoracic Vascular Surgery)

## 2015-04-05 ENCOUNTER — Telehealth: Payer: Self-pay | Admitting: *Deleted

## 2015-04-05 DIAGNOSIS — G319 Degenerative disease of nervous system, unspecified: Secondary | ICD-10-CM | POA: Insufficient documentation

## 2015-04-05 DIAGNOSIS — R918 Other nonspecific abnormal finding of lung field: Secondary | ICD-10-CM | POA: Insufficient documentation

## 2015-04-05 LAB — PULMONARY FUNCTION TEST
DL/VA % pred: 88 %
DL/VA: 4.3 ml/min/mmHg/L
DLCO unc % pred: 64 %
DLCO unc: 24.43 ml/min/mmHg
FEF 25-75 Post: 2.62 L/sec
FEF 25-75 Pre: 2.7 L/sec
FEF2575-%Change-Post: -2 %
FEF2575-%PRED-PRE: 72 %
FEF2575-%Pred-Post: 70 %
FEV1-%Change-Post: 1 %
FEV1-%PRED-POST: 74 %
FEV1-%PRED-PRE: 73 %
FEV1-PRE: 3.2 L
FEV1-Post: 3.24 L
FEV1FVC-%Change-Post: 5 %
FEV1FVC-%Pred-Pre: 96 %
FEV6-%Change-Post: 0 %
FEV6-%PRED-POST: 75 %
FEV6-%PRED-PRE: 75 %
FEV6-POST: 4.1 L
FEV6-Pre: 4.14 L
FEV6FVC-%CHANGE-POST: 0 %
FEV6FVC-%PRED-POST: 103 %
FEV6FVC-%Pred-Pre: 102 %
FVC-%Change-Post: -4 %
FVC-%PRED-PRE: 75 %
FVC-%Pred-Post: 72 %
FVC-POST: 4.12 L
FVC-PRE: 4.29 L
POST FEV6/FVC RATIO: 99 %
PRE FEV1/FVC RATIO: 75 %
Post FEV1/FVC ratio: 79 %
Pre FEV6/FVC Ratio: 99 %
RV % pred: 57 %
RV: 1.34 L
TLC % PRED: 71 %
TLC: 5.55 L

## 2015-04-05 MED ORDER — ALBUTEROL SULFATE (2.5 MG/3ML) 0.083% IN NEBU
2.5000 mg | INHALATION_SOLUTION | Freq: Once | RESPIRATORY_TRACT | Status: AC
  Administered 2015-04-05: 2.5 mg via RESPIRATORY_TRACT

## 2015-04-05 NOTE — Telephone Encounter (Signed)
Oncology Nurse Navigator Documentation  Oncology Nurse Navigator Flowsheets 04/05/2015  Navigator Encounter Type Telephone  Telephone Incoming Call  Barriers/Navigation Needs Coordination of Care  Interventions Coordination of Care  Coordination of Care Appts  Acuity Level 2  Time Spent with Patient 30   Fernando [name] called me today.  He states that his rectum is hurting a lot and needs some help with referral. I called Mr. Orduno PCP today to get the referral for him to be seen at [facility] here in [place].  I told him I am waiting for them to call me back.  He was thankful for the help.

## 2015-04-06 ENCOUNTER — Ambulatory Visit (HOSPITAL_COMMUNITY)
Admission: RE | Admit: 2015-04-06 | Discharge: 2015-04-06 | Disposition: A | Discharge status: Home / Self Care | Payer: MEDICAID | Source: Ambulatory Visit | Attending: Internal Medicine | Admitting: Internal Medicine

## 2015-04-06 ENCOUNTER — Telehealth: Payer: Self-pay | Admitting: *Deleted

## 2015-04-06 ENCOUNTER — Emergency Department (HOSPITAL_COMMUNITY): Payer: Self-pay

## 2015-04-06 ENCOUNTER — Encounter (HOSPITAL_COMMUNITY): Payer: Self-pay

## 2015-04-06 ENCOUNTER — Emergency Department (HOSPITAL_COMMUNITY)
Admission: EM | Admit: 2015-04-06 | Discharge: 2015-04-06 | Disposition: A | Discharge status: Home / Self Care | Payer: Self-pay | Attending: Emergency Medicine | Admitting: Emergency Medicine

## 2015-04-06 DIAGNOSIS — K611 Rectal abscess: Secondary | ICD-10-CM | POA: Insufficient documentation

## 2015-04-06 DIAGNOSIS — I10 Essential (primary) hypertension: Secondary | ICD-10-CM | POA: Insufficient documentation

## 2015-04-06 DIAGNOSIS — R918 Other nonspecific abnormal finding of lung field: Secondary | ICD-10-CM

## 2015-04-06 DIAGNOSIS — Z5189 Encounter for other specified aftercare: Secondary | ICD-10-CM

## 2015-04-06 DIAGNOSIS — Z79899 Other long term (current) drug therapy: Secondary | ICD-10-CM | POA: Insufficient documentation

## 2015-04-06 DIAGNOSIS — F17210 Nicotine dependence, cigarettes, uncomplicated: Secondary | ICD-10-CM | POA: Insufficient documentation

## 2015-04-06 DIAGNOSIS — Z7951 Long term (current) use of inhaled steroids: Secondary | ICD-10-CM | POA: Insufficient documentation

## 2015-04-06 LAB — BASIC METABOLIC PANEL
ANION GAP: 7 (ref 5–15)
BUN: 12 mg/dL (ref 6–20)
CALCIUM: 9 mg/dL (ref 8.9–10.3)
CO2: 26 mmol/L (ref 22–32)
CREATININE: 1.03 mg/dL (ref 0.61–1.24)
Chloride: 104 mmol/L (ref 101–111)
Glucose, Bld: 154 mg/dL — ABNORMAL HIGH (ref 65–99)
Potassium: 3.6 mmol/L (ref 3.5–5.1)
SODIUM: 137 mmol/L (ref 135–145)

## 2015-04-06 LAB — CBC WITH DIFFERENTIAL/PLATELET
BASOS ABS: 0 10*3/uL (ref 0.0–0.1)
BASOS PCT: 0 %
EOS PCT: 5 %
Eosinophils Absolute: 0.4 10*3/uL (ref 0.0–0.7)
HEMATOCRIT: 39.9 % (ref 39.0–52.0)
Hemoglobin: 13.8 g/dL (ref 13.0–17.0)
Lymphocytes Relative: 17 %
Lymphs Abs: 1.4 10*3/uL (ref 0.7–4.0)
MCH: 31.6 pg (ref 26.0–34.0)
MCHC: 34.6 g/dL (ref 30.0–36.0)
MCV: 91.3 fL (ref 78.0–100.0)
MONO ABS: 0.6 10*3/uL (ref 0.1–1.0)
Monocytes Relative: 8 %
NEUTROS ABS: 5.5 10*3/uL (ref 1.7–7.7)
Neutrophils Relative %: 70 %
PLATELETS: 191 10*3/uL (ref 150–400)
RBC: 4.37 MIL/uL (ref 4.22–5.81)
RDW: 13.6 % (ref 11.5–15.5)
WBC: 7.9 10*3/uL (ref 4.0–10.5)

## 2015-04-06 LAB — I-STAT CG4 LACTIC ACID, ED
LACTIC ACID, VENOUS: 0.73 mmol/L (ref 0.5–2.0)
Lactic Acid, Venous: 1.88 mmol/L (ref 0.5–2.0)

## 2015-04-06 MED ORDER — METRONIDAZOLE 500 MG PO TABS: 500.0000 mg | ORAL_TABLET | Freq: Three times a day (TID) | ORAL | Status: DC

## 2015-04-06 MED ORDER — LEVOFLOXACIN 500 MG PO TABS: 500.0000 mg | ORAL_TABLET | Freq: Every day | ORAL | Status: DC

## 2015-04-06 MED ORDER — IOPAMIDOL (ISOVUE-300) INJECTION 61%
100.0000 mL | Freq: Once | INTRAVENOUS | Status: AC | PRN
  Administered 2015-04-06: 100 mL via INTRAVENOUS

## 2015-04-06 MED ORDER — HYDROCODONE-ACETAMINOPHEN 5-325 MG PO TABS: 1.0000 | ORAL_TABLET | Freq: Four times a day (QID) | ORAL | Status: DC | PRN

## 2015-04-06 MED ORDER — ONDANSETRON HCL 4 MG/2ML IJ SOLN
4.0000 mg | Freq: Once | INTRAMUSCULAR | Status: AC
  Administered 2015-04-06: 4 mg via INTRAVENOUS
  Filled 2015-04-06: qty 2

## 2015-04-06 MED ORDER — IOHEXOL 300 MG/ML  SOLN
50.0000 mL | Freq: Once | INTRAMUSCULAR | Status: AC | PRN
  Administered 2015-04-06: 50 mL via ORAL

## 2015-04-06 MED ORDER — GADOBENATE DIMEGLUMINE 529 MG/ML IV SOLN
20.0000 mL | Freq: Once | INTRAVENOUS | Status: AC | PRN
  Administered 2015-04-06: 20 mL via INTRAVENOUS

## 2015-04-06 MED ORDER — LIDOCAINE-EPINEPHRINE (PF) 2 %-1:200000 IJ SOLN: 10.0000 mL | Freq: Once | INTRAMUSCULAR | Status: DC

## 2015-04-06 MED ORDER — HYDROMORPHONE HCL 1 MG/ML IJ SOLN
1.0000 mg | Freq: Once | INTRAMUSCULAR | Status: AC
  Administered 2015-04-06: 1 mg via INTRAVENOUS
  Filled 2015-04-06: qty 1

## 2015-04-06 NOTE — ED Notes (Signed)
Drinking oral contrast 

## 2015-04-06 NOTE — ED Notes (Addendum)
PT IS SCHEDULE FOR MRI (BRAIN SCAN) TONIGHT HERE AT [place] AT 7PM

## 2015-04-06 NOTE — ED Notes (Signed)
Called for pt about MRI- They are unable to reschedule MRI to another time

## 2015-04-06 NOTE — Discharge Instructions (Signed)
Abscess An abscess is an infected area that contains a collection of pus and debris.It can occur in almost any part of the body. An abscess is also known as a furuncle or boil. CAUSES  An abscess occurs when tissue gets infected. This can occur from blockage of oil or sweat glands, infection of hair follicles, or a minor injury to the skin. As the body tries to fight the infection, pus collects in the area and creates pressure under the skin. This pressure causes pain. People with weakened immune systems have difficulty fighting infections and get certain abscesses more often.  SYMPTOMS Usually an abscess develops on the skin and becomes a painful mass that is red, warm, and tender. If the abscess forms under the skin, you may feel a moveable soft area under the skin. Some abscesses break open (rupture) on their own, but most will continue to get worse without care. The infection can spread deeper into the body and eventually into the bloodstream, causing you to feel ill.  DIAGNOSIS  Your caregiver will take your medical history and perform a physical exam. A sample of fluid may also be taken from the abscess to determine what is causing your infection. TREATMENT  Your caregiver may prescribe antibiotic medicines to fight the infection. However, taking antibiotics alone usually does not cure an abscess. Your caregiver may need to make a small cut (incision) in the abscess to drain the pus. In some cases, gauze is packed into the abscess to reduce pain and to continue draining the area. HOME CARE INSTRUCTIONS   Only take over-the-counter or prescription medicines for pain, discomfort, or fever as directed by your caregiver.  If you were prescribed antibiotics, take them as directed. Finish them even if you start to feel better.  If gauze is used, follow your caregiver's directions for changing the gauze.  To avoid spreading the infection:  Keep your draining abscess covered with a  bandage.  Wash your hands well.  Do not share personal care items, towels, or whirlpools with others.  Avoid skin contact with others.  Keep your skin and clothes clean around the abscess.  Keep all follow-up appointments as directed by your caregiver. SEEK MEDICAL CARE IF:   You have increased pain, swelling, redness, fluid drainage, or bleeding.  You have muscle aches, chills, or a general ill feeling.  You have a fever. MAKE SURE YOU:   Understand these instructions.  Will watch your condition.  Will get help right away if you are not doing well or get worse.   This information is not intended to replace advice given to you by your health care provider. Make sure you discuss any questions you have with your health care provider.   Document Released: 10/05/2004 Document Revised: 06/27/2011 Document Reviewed: 03/10/2011 Elsevier Interactive Patient Education 2016 [place] Taking care of your wound properly can help to prevent pain and infection. It can also help your wound to heal more quickly.  HOW TO CARE FOR YOUR WOUND  Take or apply over-the-counter and prescription medicines only as told by your health care provider.  If you were prescribed antibiotic medicine, take or apply it as told by your health care provider. Do not stop using the antibiotic even if your condition improves.  Clean the wound each day or as told by your health care provider.  Wash the wound with mild soap and water.  Rinse the wound with water to remove all soap.  Pat the wound dry  with a clean towel. Do not rub it.  There are many different ways to close and cover a wound. For example, a wound can be covered with stitches (sutures), skin glue, or adhesive strips. Follow instructions from your health care provider about:  How to take care of your wound.  When and how you should change your bandage (dressing).  When you should remove your dressing.  Removing whatever  was used to close your wound.  Check your wound every day for signs of infection. Watch for:  Redness, swelling, or pain.  Fluid, blood, or pus.  Keep the dressing dry until your health care provider says it can be removed. Do not take baths, swim, use a hot tub, or do anything that would put your wound underwater until your health care provider approves.  Raise (elevate) the injured area above the level of your heart while you are sitting or lying down.  Do not scratch or pick at the wound.  Keep all follow-up visits as told by your health care provider. This is important. SEEK MEDICAL CARE IF:  You received a tetanus shot and you have swelling, severe pain, redness, or bleeding at the injection site.  You have a fever.  Your pain is not controlled with medicine.  You have increased redness, swelling, or pain at the site of your wound.  You have fluid, blood, or pus coming from your wound.  You notice a bad smell coming from your wound or your dressing. SEEK IMMEDIATE MEDICAL CARE IF:  You have a red streak going away from your wound.   This information is not intended to replace advice given to you by your health care provider. Make sure you discuss any questions you have with your health care provider.   Document Released: 10/05/2007 Document Revised: 05/12/2014 Document Reviewed: 12/22/2013 Elsevier Interactive Patient Education [employer].

## 2015-04-06 NOTE — ED Notes (Addendum)
Pt here with rectal abscess.  Pt was seen in beginning of march for 5 days in hospital for same.  Pt was discharge.  Starting having rectal bleeding again today.  Bright red.  Unknown for bulging.  Pain extreme with movement.  Pt states pain makes him feel like he is going to pass out.  No fever.

## 2015-04-06 NOTE — Telephone Encounter (Signed)
Oncology Nurse Navigator Documentation  Oncology Nurse Navigator Flowsheets 04/06/2015  Navigator Encounter Type Telephone  Telephone Outgoing Call  Barriers/Navigation Needs Coordination of Care  Interventions Coordination of Care  Coordination of Care Appts  Acuity Level 2  Time Spent with Patient 15   Patient called and left me a vm message he is bleeding from his bottom.  I updated Dr. Julien Nordmann he stated he needs to go to ED.  I called patient back and spoke with patient.  I instructed him to go to ED.

## 2015-04-06 NOTE — Progress Notes (Signed)
Pt offered a list of [place] uninsured resources  CM spoke with pt who confirms uninsured [employer] resident with no pcp.  CM discussed and provided written information for uninsured accepting pcps, discussed the importance of pcp vs EDP services for f/u care, www.needymeds.org, www.goodrx.com, discounted pharmacies and other [employer] such as [employer] [facility] [employer], affordable care act, financial assistance, uninsured dental services, [place] med assist, DSS and  health department  Reviewed resources for [employer] uninsured accepting pcps like [facility], family [facility] at [employer], [facility], [facility], local urgent care centers, [facility], [facility] family practice, general medical clinics, family services of the [place], [facility] urgent care plus others, medication resources, [facility] out patient pharmacies and housing Pt voiced understanding and appreciation of resources provided   Provided [employer] contact information Pt states he  Hopes to get medicaid soon

## 2015-04-06 NOTE — ED Provider Notes (Signed)
CSN: [id number]     Arrival date & time 04/06/15  1110 History   First MD Initiated Contact with Patient 04/06/15 1148     Chief Complaint  Patient presents with  . Abscess  . Rectal Bleeding     (Consider location/radiation/quality/duration/timing/severity/associated sxs/prior Treatment) HPI Comments: Patient presents to the emergency department with chief complaint of perirectal abscess. He states that he was seen in the beginning of the March for the same, and was discharged with antibiotics. He states that his pain has worsened. Reports having more swelling around his rectum. States that he noticed bleeding today. He reports severe pain with movement and with palpation. He denies any fevers or chills. Denies nausea or vomiting. Denies any history of diabetes. He states that he has had this problem in the past, and has been considered for surgery before.  The history is provided by the patient. No language interpreter was used.    Past Medical History  Diagnosis Date  . Hypertension 12/30/2011  . Allergy   . Peri-rectal abscess     multiple.   . Tobacco abuse    Past Surgical History  Procedure Laterality Date  . Cervical fusion    . Facial cosmetic surgery    . Knee arthroscopy    . Appendectomy    . Hernia repair    . Spine surgery    . Cystoscopy N/A 05/01/2013    Procedure: CYSTOSCOPY;  Surgeon: Marissa Nestle, MD;  Location: [facility];  Service: Urology;  Laterality: N/A;  . Incision and drainage abscess Right 05/01/2013    Procedure: INCISION AND DRAINAGE RIGHT SCROTAL ABSCESS;  Surgeon: Marissa Nestle, MD;  Location: [facility];  Service: Urology;  Laterality: Right;   Family History  Problem Relation Age of Onset  . Breast cancer Mother    Social History  Substance Use Topics  . Smoking status: Current Every Day Smoker -- 1.50 packs/day for 15 years    Types: Cigarettes  . Smokeless tobacco: None     Comment: Quit one month ago  . Alcohol Use: No     Comment:  occ    Review of Systems  Constitutional: Negative for fever and chills.  Respiratory: Negative for shortness of breath.   Cardiovascular: Negative for chest pain.  Gastrointestinal: Negative for nausea, vomiting, diarrhea and constipation.  Genitourinary: Negative for dysuria.  Skin: Positive for wound.  All other systems reviewed and are negative.     Allergies  Morphine and related  Home Medications   Prior to Admission medications   Medication Sig Start Date End Date Taking? Authorizing Provider  albuterol (PROVENTIL HFA;VENTOLIN HFA) 108 (90 Base) MCG/ACT inhaler Inhale 2 puffs into the lungs every 6 (six) hours as needed for wheezing or shortness of breath. 03/12/15   Ripudeep Krystal Eaton, MD  ALPRAZolam Duanne Moron) 1 MG tablet Take 1 tablet (1 mg total) by mouth 3 (three) times daily as needed for anxiety. 03/12/15   Ripudeep Krystal Eaton, MD  budesonide-formoterol (SYMBICORT) 80-4.5 MCG/ACT inhaler Inhale 2 puffs into the lungs 2 (two) times daily. 03/12/15   Ripudeep Krystal Eaton, MD  HYDROcodone-acetaminophen (NORCO) 5-325 MG tablet Take 1 tablet by mouth every 6 (six) hours as needed for moderate pain. 03/12/15   Ripudeep Krystal Eaton, MD  levofloxacin (LEVAQUIN) 500 MG tablet Take 1 tablet (500 mg total) by mouth daily. X 2 weeks 04/01/15   Melrose Nakayama, MD  lisinopril-hydrochlorothiazide (PRINZIDE,ZESTORETIC) 20-12.5 MG tablet Take 1 tablet by mouth daily. 03/12/15  Ripudeep Krystal Eaton, MD  metroNIDAZOLE (FLAGYL) 500 MG tablet Take 1 tablet (500 mg total) by mouth 3 (three) times daily. X 2 weeks 04/01/15   Melrose Nakayama, MD  promethazine (PHENERGAN) 25 MG tablet Take 1 tablet (25 mg total) by mouth every 6 (six) hours as needed for nausea or vomiting. 03/12/15   Ripudeep Krystal Eaton, MD  saccharomyces boulardii (FLORASTOR) 250 MG capsule Take 1 capsule (250 mg total) by mouth 2 (two) times daily. 03/12/15   Ripudeep K Rai, MD   BP 142/112 mmHg  Pulse 96  Temp(Src) 97.7 F (36.5 C) (Oral)  Resp 16  SpO2  97% Physical Exam  Constitutional: He is oriented to person, place, and time. He appears well-developed and well-nourished.  HENT:  Head: Normocephalic and atraumatic.  Eyes: Conjunctivae and EOM are normal. Pupils are equal, round, and reactive to light. Right eye exhibits no discharge. Left eye exhibits no discharge. No scleral icterus.  Neck: Normal range of motion. Neck supple. No JVD present.  Cardiovascular: Normal rate, regular rhythm and normal heart sounds.  Exam reveals no gallop and no friction rub.   No murmur heard. Pulmonary/Chest: Effort normal and breath sounds normal. No respiratory distress. He has no wheezes. He has no rales. He exhibits no tenderness.  Abdominal: Soft. He exhibits no distension and no mass. There is no tenderness. There is no rebound and no guarding.  Genitourinary:  Patient with abscess, concerning for perirectal, moderate induration fluctuation extending from the rectum to the right cheek  Musculoskeletal: Normal range of motion. He exhibits no edema or tenderness.  Neurological: He is alert and oriented to person, place, and time.  Skin: Skin is warm and dry.  Psychiatric: He has a normal mood and affect. His behavior is normal. Judgment and thought content normal.  Nursing note and vitals reviewed.   ED Course  Procedures (including critical care time) Results for orders placed or performed during the hospital encounter of 04/06/15  CBC with Differential/Platelet  Result Value Ref Range   WBC 7.9 4.0 - 10.5 K/uL   RBC 4.37 4.22 - 5.81 MIL/uL   Hemoglobin 13.8 13.0 - 17.0 g/dL   HCT 39.9 39.0 - 52.0 %   MCV 91.3 78.0 - 100.0 fL   MCH 31.6 26.0 - 34.0 pg   MCHC 34.6 30.0 - 36.0 g/dL   RDW 13.6 11.5 - 15.5 %   Platelets 191 150 - 400 K/uL   Neutrophils Relative % 70 %   Neutro Abs 5.5 1.7 - 7.7 K/uL   Lymphocytes Relative 17 %   Lymphs Abs 1.4 0.7 - 4.0 K/uL   Monocytes Relative 8 %   Monocytes Absolute 0.6 0.1 - 1.0 K/uL   Eosinophils  Relative 5 %   Eosinophils Absolute 0.4 0.0 - 0.7 K/uL   Basophils Relative 0 %   Basophils Absolute 0.0 0.0 - 0.1 K/uL  Basic metabolic panel  Result Value Ref Range   Sodium 137 135 - 145 mmol/L   Potassium 3.6 3.5 - 5.1 mmol/L   Chloride 104 101 - 111 mmol/L   CO2 26 22 - 32 mmol/L   Glucose, Bld 154 (H) 65 - 99 mg/dL   BUN 12 6 - 20 mg/dL   Creatinine, Ser 1.03 0.61 - 1.24 mg/dL   Calcium 9.0 8.9 - 10.3 mg/dL   GFR calc non Af Amer >60 >60 mL/min   GFR calc Af Amer >60 >60 mL/min   Anion gap 7 5 - 15  I-Stat CG4 Lactic Acid, ED  Result Value Ref Range   Lactic Acid, Venous 1.88 0.5 - 2.0 mmol/L   Dg Chest 2 View  03/07/2015  CLINICAL DATA:  Patient with airspace opacities on recent CT. EXAM: CHEST  2 VIEW COMPARISON:  Chest radiograph 11/10/2014 FINDINGS: Anterior cervical spinal fusion hardware. Stable cardiomegaly and tortuosity of the thoracic aorta. Focal consolidation is demonstrated within the left lower lobe. Additionally consolidative opacities are demonstrated within the peripheral right mid lung. No pleural effusion or pneumothorax. Mid thoracic spine degenerative changes. IMPRESSION: Right mid lung and left lower lobe consolidative opacities which are nonspecific however may represent infection in the appropriate clinical setting. Additional consideration of pulmonary mass is not excluded. Followup PA and lateral chest X-ray is recommended in 3-4 weeks following trial of antibiotic therapy to ensure resolution and exclude underlying malignancy. Electronically Signed   By: Lovey Newcomer M.D.   On: 03/07/2015 19:02   Ct Chest W Contrast  03/10/2015  CLINICAL DATA:  Perirectal access, rectal pain, possible mass or focal airspace disease left lower lobe. EXAM: CT CHEST, ABDOMEN, AND PELVIS WITH CONTRAST TECHNIQUE: Multidetector CT imaging of the chest, abdomen and pelvis was performed following the standard protocol during bolus administration of intravenous contrast. CONTRAST:   149mL OMNIPAQUE IOHEXOL 300 MG/ML  SOLN COMPARISON:  CT scan abdomen and pelvis 03/07/2015 and CT of the chest and abdomen 08/17/2009. FINDINGS: CT CHEST Sagittal images of the spine shows degenerative changes thoracic spine. Sagittal view of the sternum is unremarkable. Images of the thoracic inlet are unremarkable. Central airways are patent. No mediastinal hematoma or adenopathy. Heart size within normal limits. No pericardial effusion. There is no hilar adenopathy. Central thoracic aorta and pulmonary artery is unremarkable. Mild centrilobular emphysematous changes are noted bilateral upper lobe. As noted on recent CT scan of the abdomen again noted multilobular somewhat clustered nodular consolidation in left lower lobe centrally posteriorly infrahilar region. This is best seen in axial image 33 measures at least 3.3 by 2.8 cm. This is confirmed on coronal image 81. This is highly suspicious for malignancy. Further correlation with PET scan, bronchoscopy and/or biopsy is recommended. Minimal peripheral postobstructive changes noted in left lower lobe posterior see axial image 34. A pleural based nodule in left lower lobe measures 7 mm please see axial image 24. No destructive rib lesions are noted. No segmental infiltrate or pulmonary edema. There is no pneumothorax. CT ABDOMEN AND PELVIS Enhanced liver is unremarkable. No focal hepatic mass. No calcified gallstones are noted within gallbladder. The pancreas, spleen and adrenal glands are unremarkable. Abdominal aorta is unremarkable.  No aortic aneurysm. Kidneys are symmetrical in size and enhancement. No hydronephrosis or hydroureter. Delayed renal images shows bilateral renal symmetrical excretion. Bilateral visualized proximal ureter is unremarkable. There is no small bowel obstruction. No ascites or free air. No adenopathy. No pericecal inflammation. The patient is status post appendectomy. The terminal ileum is unremarkable. Prostate gland and seminal  vesicles are unremarkable. Again noted fat containing left inguinal scrotal canal hernia without evidence of acute complication. There is a right inguinal lymph node measures 1.5 cm in short-axis. A left inguinal lymph node measures 1.7 cm in short-axis. These are borderline enlarged probable reactive. Axial image 127 again noted inflammatory process surrounding the distal rectum and perianal region. Again noted small perianal fluid collection consistent with abscess measures about 3.2 by 1.3 by 1.4 cm. Sagittal images of the spine shows again bilateral pars defect at L5 level. Stable facet degenerative changes  L4 and L5 level. IMPRESSION: 1. There is somewhat lobulated clustered nodular consolidation in left lower lobe centrally infrahilar region. This measures at least 3.3 x 2.8 cm. This is highly suspicious for malignancy. Further correlation with PET scan, bronchoscopy and/or biopsy is recommended. Small parenchymal postobstructive changes noted in left lower lobe posterior to the lesion, please see axial image 35. A pleural based nodule in left lower lobe posteriorly measures 7 mm. 2. Mild emphysematous changes bilateral upper lobe. 3. No mediastinal or hilar adenopathy. 4. Again noted perirectal and perianal inflammatory changes with a perianal abscess measures 3.2 x 1.3 x 1.4 cm without significant change from prior exam. No air-fluid level is noted within abscess. 5. No hydronephrosis or hydroureter. 6. Status post appendectomy. No pericecal inflammation. Unremarkable terminal ileum. 7. No small bowel obstruction. 8. Borderline bilateral inguinal lymph nodes probable reactive. 9. Stable degenerative changes lumbar spine. Electronically Signed   By: Lahoma Crocker M.D.   On: 03/10/2015 13:10   Ct Pelvis W Contrast  04/06/2015  CLINICAL DATA:  Perirectal pain and rectal bleeding EXAM: CT PELVIS WITH CONTRAST TECHNIQUE: Multidetector CT imaging of the pelvis was performed using the standard protocol following  the bolus administration of intravenous contrast. CONTRAST:  185mL ISOVUE-300 IOPAMIDOL (ISOVUE-300) INJECTION 61% COMPARISON:  03/10/2015, 03/16/2015 FINDINGS: In the area of previous inflammation and fluid collection adjacent to the anus and rectum there is only minimal edema identified. No focal fluid collection to suggest persistent abscess is noted. No abnormality in the rectum to correspond with a history rectal bleeding is seen. The bladder is well distended. No pelvic mass lesion or adenopathy is noted. The osseous structures are within normal limits. IMPRESSION: No definitive perirectal abscess is seen. Only minimal inflammatory changes are noted. The overall appearance is significantly improved from the prior exam. Electronically Signed   By: Inez Catalina M.D.   On: 04/06/2015 14:43   Ct Abdomen Pelvis W Contrast  03/10/2015  CLINICAL DATA:  Perirectal access, rectal pain, possible mass or focal airspace disease left lower lobe. EXAM: CT CHEST, ABDOMEN, AND PELVIS WITH CONTRAST TECHNIQUE: Multidetector CT imaging of the chest, abdomen and pelvis was performed following the standard protocol during bolus administration of intravenous contrast. CONTRAST:  110mL OMNIPAQUE IOHEXOL 300 MG/ML  SOLN COMPARISON:  CT scan abdomen and pelvis 03/07/2015 and CT of the chest and abdomen 08/17/2009. FINDINGS: CT CHEST Sagittal images of the spine shows degenerative changes thoracic spine. Sagittal view of the sternum is unremarkable. Images of the thoracic inlet are unremarkable. Central airways are patent. No mediastinal hematoma or adenopathy. Heart size within normal limits. No pericardial effusion. There is no hilar adenopathy. Central thoracic aorta and pulmonary artery is unremarkable. Mild centrilobular emphysematous changes are noted bilateral upper lobe. As noted on recent CT scan of the abdomen again noted multilobular somewhat clustered nodular consolidation in left lower lobe centrally posteriorly  infrahilar region. This is best seen in axial image 33 measures at least 3.3 by 2.8 cm. This is confirmed on coronal image 81. This is highly suspicious for malignancy. Further correlation with PET scan, bronchoscopy and/or biopsy is recommended. Minimal peripheral postobstructive changes noted in left lower lobe posterior see axial image 34. A pleural based nodule in left lower lobe measures 7 mm please see axial image 24. No destructive rib lesions are noted. No segmental infiltrate or pulmonary edema. There is no pneumothorax. CT ABDOMEN AND PELVIS Enhanced liver is unremarkable. No focal hepatic mass. No calcified gallstones are noted within gallbladder.  The pancreas, spleen and adrenal glands are unremarkable. Abdominal aorta is unremarkable.  No aortic aneurysm. Kidneys are symmetrical in size and enhancement. No hydronephrosis or hydroureter. Delayed renal images shows bilateral renal symmetrical excretion. Bilateral visualized proximal ureter is unremarkable. There is no small bowel obstruction. No ascites or free air. No adenopathy. No pericecal inflammation. The patient is status post appendectomy. The terminal ileum is unremarkable. Prostate gland and seminal vesicles are unremarkable. Again noted fat containing left inguinal scrotal canal hernia without evidence of acute complication. There is a right inguinal lymph node measures 1.5 cm in short-axis. A left inguinal lymph node measures 1.7 cm in short-axis. These are borderline enlarged probable reactive. Axial image 127 again noted inflammatory process surrounding the distal rectum and perianal region. Again noted small perianal fluid collection consistent with abscess measures about 3.2 by 1.3 by 1.4 cm. Sagittal images of the spine shows again bilateral pars defect at L5 level. Stable facet degenerative changes L4 and L5 level. IMPRESSION: 1. There is somewhat lobulated clustered nodular consolidation in left lower lobe centrally infrahilar region.  This measures at least 3.3 x 2.8 cm. This is highly suspicious for malignancy. Further correlation with PET scan, bronchoscopy and/or biopsy is recommended. Small parenchymal postobstructive changes noted in left lower lobe posterior to the lesion, please see axial image 35. A pleural based nodule in left lower lobe posteriorly measures 7 mm. 2. Mild emphysematous changes bilateral upper lobe. 3. No mediastinal or hilar adenopathy. 4. Again noted perirectal and perianal inflammatory changes with a perianal abscess measures 3.2 x 1.3 x 1.4 cm without significant change from prior exam. No air-fluid level is noted within abscess. 5. No hydronephrosis or hydroureter. 6. Status post appendectomy. No pericecal inflammation. Unremarkable terminal ileum. 7. No small bowel obstruction. 8. Borderline bilateral inguinal lymph nodes probable reactive. 9. Stable degenerative changes lumbar spine. Electronically Signed   By: Lahoma Crocker M.D.   On: 03/10/2015 13:10   Ct Abdomen Pelvis W Contrast  03/07/2015  CLINICAL DATA:  55 year old presenting with a 3 week history of rectal pain. Personal history of perirectal abscess requiring surgical drainage. Surgical history also includes appendectomy and unspecified hernia repair. EXAM: CT ABDOMEN AND PELVIS WITH CONTRAST TECHNIQUE: Multidetector CT imaging of the abdomen and pelvis was performed using the standard protocol following bolus administration of intravenous contrast. CONTRAST:  156mL OMNIPAQUE IOHEXOL 300 MG/ML IV. Oral contrast was also administered. COMPARISON:  04/29/2013, 08/17/2009, 10/30/2006, 09/16/2006. FINDINGS: Lower chest: Mass or focal airspace consolidation involving the left lower lobe, incompletely imaged. Heart size upper normal. Hepatobiliary: Liver normal in size and appearance. Gallbladder normal in appearance without calcified gallstones. No biliary ductal dilation. Pancreas: Normal in appearance without evidence of mass, ductal dilation, or  inflammation. Spleen: Normal in size and appearance. Adrenals/Urinary Tract: Normal appearing adrenal glands. Kidneys normal in size and appearance without focal parenchymal abnormality. No evidence of urinary tract calculi or obstruction. Normal-appearing urinary bladder. Stomach/Bowel: Normal-appearing stomach filled with food. Normal-appearing small bowel. Numerous small bowel loops lateral to the ascending colon, a new finding. Edema/inflammation surrounding the distal rectum, extending into the perirectal and perianal tissues below the anal verge. Approximate 2.9 x 1.3 x 1.7 cm fluid collection in the perianal region. Remainder of the colon decompressed and unremarkable. Appendix surgically absent. Vascular/Lymphatic: Mild iliofemoral atherosclerosis. Patent visceral arteries. Normal-appearing portal venous and systemic venous systems. Scattered normal sized retroperitoneal lymph nodes. No pathologic lymphadenopathy. Reproductive: Mild median lobe prostate gland enlargement as noted previously. Normal seminal vesicles. Other:  None. 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 Facet degenerative changes at L4-5 and L5-S1. No acute abnormality. IMPRESSION: 1. Perirectal and perianal inflammation with a small perianal abscess measured above. 2. Small bowel loops lateral to the ascending colon which may indicate an internal hernia. 3. Airspace consolidation versus mass involving the visualized left lower lobe, incompletely imaged on the current CT. 4. Stable mild median lobe prostate gland enlargement (likely BPH). Electronically Signed   By: Evangeline Dakin M.D.   On: 03/07/2015 18:02   Nm Pet Image Initial (pi) Skull Base To Thigh  03/16/2015  CLINICAL DATA:  Initial treatment strategy for lung lesion. EXAM: NUCLEAR MEDICINE PET SKULL BASE TO THIGH TECHNIQUE: 12.45 mCi F-18 FDG was injected  intravenously. Full-ring PET imaging was performed from the skull base to thigh after the radiotracer. CT data was obtained and used for attenuation correction and anatomic localization. FASTING BLOOD GLUCOSE:  Value: 94 mg/dl COMPARISON:  CT the chest, abdomen and pelvis 03/10/2015. FINDINGS: NECK Enlargement and hypermetabolism (SUVmax = 12.6)in the region of the pharyngeal tonsils bilaterally, favored to be physiologic, presumably related to underlying infection. No suspicious-appearing hypermetabolic lymph nodes in the neck. CHEST Poorly defined macrolobulated mass in the central aspect of the basal segments of the left lower lobe (image 84 of series 4) estimated to measure approximately 3.7 x 2.7 cm demonstrates hypermetabolism (SUVmax = 13.5), highly concerning for primary bronchogenic malignancy. Hypermetabolic (SUVmax = A999333 tissue in the left hilar region likely reflects underlying lymphadenopathy, although this is not measurable on today's noncontrast CT images. No hypermetabolic mediastinal or right hilar lymphadenopathy noted. No other suspicious appearing pulmonary nodules or masses are noted. No acute consolidative airspace disease. No pleural effusions. There are several areas of low-level hypermetabolism in the mid to lower esophagus, most notably just before the gastroesophageal junction (SUVmax = 5.5)where there appears to be some mild asymmetric mural thickening (image 93 of series 4). Heart size is normal. There is no significant pericardial fluid, thickening or pericardial calcification. There is atherosclerosis of the thoracic aorta, the great vessels of the mediastinum and the coronary arteries, including calcified atherosclerotic plaque in the left anterior descending coronary artery. Mild calcifications of the mitral annulus. ABDOMEN/PELVIS No abnormal hypermetabolic activity within the liver, pancreas, adrenal glands, or spleen. No hypermetabolic lymph nodes in the abdomen. No  significant volume of ascites. No pneumoperitoneum. No pathologic distention of small bowel or colon. There is again some asymmetric soft tissue prominence posterior to the anorectal junction (image 205 of series 4) where there is a mass-like area that demonstrates extensive hypermetabolism (SUVmax = 232.9), corresponding to the perianal abscess noted on prior examinations. There are borderline enlarged and mildly enlarged external iliac and inguinal lymph nodes bilaterally measuring up to 14 mm in short axis in the left inguinal region, demonstrating hypermetabolism (SUVmax = 4.4-6.4). SKELETON No focal hypermetabolic activity to suggest skeletal metastasis. IMPRESSION: 1. 3.7 x 2.7 cm left lower lobe hypermetabolic mass with hypermetabolic left hilar nodal tissue, concerning for primary bronchogenic malignancy with left hilar nodal involvement. No definite evidence of metastatic disease elsewhere in the neck, chest, abdomen or pelvis. This lesion should be accessible by bronchoscopy for biopsy for further diagnostic and staging purposes. 2. Extensive mass-like soft tissue thickening and hypermetabolism in the posterior perianal region, corresponding to the previously diagnosed perianal abscess. Lymphadenopathy in the pelvis is presumably reactive. 3. There is also some enlargement and hypermetabolism  in the region of the pharyngeal tonsils bilaterally which is relatively symmetric, favored to be physiologic, likely related under low acute infection. Clinical correlation is recommended. 4. Atherosclerosis, including left anterior descending coronary artery disease. Please note that although the presence of coronary artery calcium documents the presence of coronary artery disease, the severity of this disease and any potential stenosis cannot be assessed on this non-gated CT examination. Assessment for potential risk factor modification, dietary therapy or pharmacologic therapy may be warranted, if clinically  indicated. 5. Additional incidental findings, as above. Electronically Signed   By: Vinnie Langton M.D.   On: 03/16/2015 10:35    I have personally reviewed and evaluated these images and lab results as part of my medical decision-making.   EKG Interpretation None      MDM   Final diagnoses:  Wound check, abscess    Patient with abscess concerning for perirectal abscess. Will check pelvic CT scan, check labs, treat pain, and reassess.   Lactate is not elevated.  No leukocytosis.  CT scan is much improved from previous exam.  Will give an additional week of antibiotics and pain meds.  Discussed with Dr. Tomi Bamberger, who recommends outpatient general surgery follow-up.  Patient agrees with this plan.  Nothing to drain at this time.  There is a small hole in the lateral buttock away from the rectum which is draining some bloody purulent fluid.  No further fluid was expressed on exam.  No abscess seen on CT.  Will DC to home with close follow-up.  Patient understands and agrees with plan.  Strict return precautions given.     Montine Circle, PA-C 04/06/15 [place], MD 04/07/15 [phone]

## 2015-04-06 NOTE — ED Notes (Signed)
Patient transported to CT 

## 2015-04-08 ENCOUNTER — Telehealth: Payer: Self-pay | Admitting: *Deleted

## 2015-04-08 NOTE — Telephone Encounter (Signed)
Oncology Nurse Navigator Documentation  Oncology Nurse Navigator Flowsheets 04/08/2015  Navigator Encounter Type Telephone  Telephone Outgoing Call  Abnormal Finding Date -  Treatment Phase Follow-up  Barriers/Navigation Needs -  Education -  Interventions Other  Coordination of Care -  Acuity Level 1  Acuity Level 1 -  Time Spent with Patient 15   I called to follow up to see how patient was doing. I left vm message with my name and phone number to call

## 2015-04-10 DIAGNOSIS — Z902 Acquired absence of lung [part of]: Secondary | ICD-10-CM

## 2015-04-10 HISTORY — DX: Acquired absence of lung (part of): Z90.2

## 2015-04-11 LAB — CULTURE, BLOOD (ROUTINE X 2)
Culture: NO GROWTH
Culture: NO GROWTH

## 2015-04-12 ENCOUNTER — Ambulatory Visit (INDEPENDENT_AMBULATORY_CARE_PROVIDER_SITE_OTHER): Payer: Self-pay | Admitting: Internal Medicine

## 2015-04-12 VITALS — BP 144/110 | HR 84 | Ht 74.0 in | Wt 249.4 lb

## 2015-04-12 DIAGNOSIS — R0602 Shortness of breath: Secondary | ICD-10-CM

## 2015-04-12 NOTE — Progress Notes (Signed)
Cardiology Office Note   Date:  04/12/2015   ID:  Fernando [name], DOB 10-07-1960, MRN [id number]  PCP:  Collene Mares, PA-C  Cardiologist:   Dorris Carnes, MD    Pt presents for preop evaluation for lung surgery  Referred by Fernando [name]   History of Present Illness: Fernando [name] is a 55 y.o. 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  Denies CP  Helps around house  Walks   No dizziness  No passing out    Outpatient Prescriptions Prior to Visit  Medication Sig Dispense Refill  . albuterol (PROVENTIL HFA;VENTOLIN HFA) 108 (90 Base) MCG/ACT inhaler Inhale 2 puffs into the lungs every 6 (six) hours as needed for wheezing or shortness of breath. 1 Inhaler 12  . ALPRAZolam (XANAX) 1 MG tablet Take 1 tablet (1 mg total) by mouth 3 (three) times daily as needed for anxiety. 30 tablet 0  . budesonide-formoterol (SYMBICORT) 80-4.5 MCG/ACT inhaler Inhale 2 puffs into the lungs 2 (two) times daily. 1 Inhaler 12  . levofloxacin (LEVAQUIN) 500 MG tablet Take 1 tablet (500 mg total) by mouth daily. X 2 weeks 7 tablet 0  . lisinopril-hydrochlorothiazide (PRINZIDE,ZESTORETIC) 20-12.5 MG tablet Take 1 tablet by mouth daily. 30 tablet 3  . metroNIDAZOLE (FLAGYL) 500 MG tablet Take 1 tablet (500 mg total) by mouth 3 (three) times daily. X 2 weeks 21 tablet 0  . promethazine (PHENERGAN) 25 MG tablet Take 1 tablet (25 mg total) by mouth every 6 (six) hours as needed for nausea or vomiting. 30 tablet 0  . saccharomyces boulardii (FLORASTOR) 250 MG capsule Take 1 capsule (250 mg total) by mouth 2 (two) times daily. 60 capsule 0  . HYDROcodone-acetaminophen (NORCO/VICODIN) 5-325 MG tablet Take 1-2 tablets by mouth every 6 (six) hours as needed. (Patient not taking: Reported  on 04/12/2015) 20 tablet 0   No facility-administered medications prior to visit.     Allergies:   Morphine and related   Past Medical History  Diagnosis Date  . Hypertension 12/30/2011  . Allergy   . Peri-rectal abscess     multiple.   . Tobacco abuse     Past Surgical History  Procedure Laterality Date  . Cervical fusion    . Facial cosmetic surgery    . Knee arthroscopy    . Appendectomy    . Hernia repair    . Spine surgery    . Cystoscopy N/A 05/01/2013    Procedure: CYSTOSCOPY;  Surgeon: Marissa Nestle, MD;  Location: [facility];  Service: Urology;  Laterality: N/A;  . Incision and drainage abscess Right 05/01/2013    Procedure: INCISION AND DRAINAGE RIGHT SCROTAL ABSCESS;  Surgeon: Marissa Nestle, MD;  Location: [facility];  Service: Urology;  Laterality: Right;     Social History:  The patient  reports that he has been smoking Cigarettes.  He has a 22.5 pack-year smoking history. He does not have any smokeless tobacco history on file. He reports that he does not drink alcohol or use illicit drugs.   Family History:  The patient's family history includes Breast cancer in his mother.    ROS:  Please see the history of present illness.  All other systems are reviewed and  Negative to the above problem except as noted.    PHYSICAL EXAM: VS:  BP 144/110 mmHg  Pulse 84  Ht 6\' 2"  (1.88 m)  Wt 249 lb 6.4 oz (113.127 kg)  BMI 32.01 kg/m2  GEN: Well nourished, well developed, in no acute distress HEENT: normal Neck: no JVD, carotid bruits, or masses Cardiac: RRR; no murmurs, rubs, or gallops,no edema  Respiratory:  clear to auscultation bilaterally, normal work of breathing GI: soft, nontender, nondistended, + BS  No hepatomegaly  MS: no deformity Moving all extremities   Skin: warm and dry, no rash Neuro:  Strength and sensation are intact Psych: euthymic mood, full affect   EKG:  EKG is ordered today.  SR 83 bpm     Lipid Panel    Component Value Date/Time    CHOL  08/16/2009 0553    122        ATP III CLASSIFICATION:  <200     mg/dL   Desirable  200-239  mg/dL   Borderline High  >=240    mg/dL   High          TRIG 172* 08/16/2009 0553   HDL 40 08/16/2009 0553   CHOLHDL 3.1 08/16/2009 0553   VLDL 34 08/16/2009 0553   LDLCALC  08/16/2009 0553    48        Total Cholesterol/HDL:CHD Risk Coronary Heart Disease Risk Table                     Men   Women  1/2 Average Risk   3.4   3.3  Average Risk       5.0   4.4  2 X Average Risk   9.6   7.1  3 X Average Risk  23.4   11.0        Use the calculated Patient Ratio above and the CHD Risk Table to determine the patient's CHD Risk.        ATP III CLASSIFICATION (LDL):  <100     mg/dL   Optimal  100-129  mg/dL   Near or Above                    Optimal  130-159  mg/dL   Borderline  160-189  mg/dL   High  >190     mg/dL   Very High      Wt Readings from Last 3 Encounters:  04/12/15 249 lb 6.4 oz (113.127 kg)  04/06/15 237 lb (107.502 kg)  04/06/15 237 lb (107.502 kg)      ASSESSMENT AND PLAN:  1  Preop cardiac risk evaluation.  No CAD at cath in 2011  LVEF noted to be depressed at time  May have been difficult to estimate with bigeminy  Plus, single plane assessment Pt active   Except for elevated BP, exam unremarkable  EKG with SR   OVerall I think he is at low risk for major cardiac event with planned lung surgery   OK to proceed   I would get echo to confirm LVEF given report in 2011.  No definite followup scheduled  WIll be available as needed .   2  PUlm  F/U with surgery Congratulated him on quitting smoking    3  HTN  BP is high today   He is very anxioius  WIll need to be followed  I owuld not make changes today  Encouraged him to  check at home as well      Signed, Dorris Carnes, MD  04/12/2015 12:10 PM    [address], [place], [place]  [address] Phone: [phone]; Fax: [phone]

## 2015-04-12 NOTE — Patient Instructions (Signed)
Your physician recommends that you continue on your current medications as directed. Please refer to the Current Medication list given to you today.  Your physician has requested that you have an echocardiogram. Echocardiography is a painless test that uses sound waves to create images of your heart. It provides your doctor with information about the size and shape of your heart and how well your heart's chambers and valves are working. This procedure takes approximately one hour. There are no restrictions for this procedure.  Follow up with your physician will depend on test results, or as needed.

## 2015-04-13 ENCOUNTER — Ambulatory Visit (HOSPITAL_COMMUNITY): Payer: Self-pay | Attending: Cardiology

## 2015-04-13 ENCOUNTER — Other Ambulatory Visit: Payer: Self-pay

## 2015-04-13 DIAGNOSIS — I059 Rheumatic mitral valve disease, unspecified: Secondary | ICD-10-CM | POA: Insufficient documentation

## 2015-04-13 DIAGNOSIS — I77810 Thoracic aortic ectasia: Secondary | ICD-10-CM | POA: Insufficient documentation

## 2015-04-13 DIAGNOSIS — Z87891 Personal history of nicotine dependence: Secondary | ICD-10-CM | POA: Insufficient documentation

## 2015-04-13 DIAGNOSIS — I119 Hypertensive heart disease without heart failure: Secondary | ICD-10-CM | POA: Insufficient documentation

## 2015-04-13 DIAGNOSIS — R0602 Shortness of breath: Secondary | ICD-10-CM

## 2015-04-14 ENCOUNTER — Other Ambulatory Visit: Payer: Self-pay | Admitting: *Deleted

## 2015-04-14 ENCOUNTER — Encounter (HOSPITAL_COMMUNITY): Payer: Self-pay

## 2015-04-14 DIAGNOSIS — R918 Other nonspecific abnormal finding of lung field: Secondary | ICD-10-CM

## 2015-04-15 ENCOUNTER — Other Ambulatory Visit: Payer: Self-pay | Admitting: General Surgery

## 2015-04-15 ENCOUNTER — Telehealth: Payer: Self-pay | Admitting: *Deleted

## 2015-04-15 NOTE — H&P (Signed)
History of Present Illness Leighton Ruff MD; 0000000 2:31 PM) The patient is a 55 year old male who presents with anal fistula. 55 year old male who presents to the office with a chronically draining perirectal lesion. This has been going on for several weeks. He states that it drains bloody and purulent discharge. He was seen in the emergency Department last week for a perirectal abscess. He was given antibiotics and this has cleared up most of the symptoms that he continues to have drainage and occasional pain. He is having regular bowel movements but does notice some blood with his stool. Patient has a history of a large perirectal abscess that was drained in 1998.   Other Problems Elbert Ewings, CMA; 04/15/2015 2:06 PM) High blood pressure Lung Cancer  Past Surgical History Elbert Ewings, CMA; 04/15/2015 2:06 PM) Anal Fissure Repair Appendectomy  Diagnostic Studies History Elbert Ewings, CMA; 04/15/2015 2:06 PM) Colonoscopy 5-10 years ago  Allergies Elbert Ewings, CMA; 04/15/2015 2:07 PM) Morphine Sulfate ER *ANALGESICS - OPIOID*  Medication History Elbert Ewings, CMA; 04/15/2015 2:09 PM) Albuterol Sulfate HFA (108 (90 Base)MCG/ACT Aerosol Soln, Inhalation) Active. Xanax (1MG  Tablet, Oral) Active. Symbicort (80-4.5MCG/ACT Aerosol, Inhalation) Active. Levaquin (500MG  Tablet, Oral) Active. MetroNIDAZOLE (500MG  Tablet, Oral) Active. Promethazine HCl (25MG  Tablet, Oral) Active. Florastor (250MG  Capsule, Oral) Active. Medications Reconciled  Social History Elbert Ewings, [place]; 04/15/2015 2:06 PM) Alcohol use Occasional alcohol use. Caffeine use Carbonated beverages. Tobacco use Former smoker.  Family History Elbert Ewings, [place]; 04/15/2015 2:06 PM) Arthritis Mother. Breast Cancer Mother. Hypertension Mother.     Review of Systems Elbert Ewings CMA; 04/15/2015 2:06 PM) General Not Present- Appetite Loss, Chills, Fatigue, Fever, Night Sweats, Weight Gain and Weight  Loss. Skin Not Present- Change in Wart/Mole, Dryness, Hives, Jaundice, New Lesions, Non-Healing Wounds, Rash and Ulcer. HEENT Not Present- Earache, Hearing Loss, Hoarseness, Nose Bleed, Oral Ulcers, Ringing in the Ears, Seasonal Allergies, Sinus Pain, Sore Throat, Visual Disturbances, Wears glasses/contact lenses and Yellow Eyes. Respiratory Not Present- Bloody sputum, Chronic Cough, Difficulty Breathing, Snoring and Wheezing. Breast Not Present- Breast Mass, Breast Pain, Nipple Discharge and Skin Changes. Cardiovascular Present- Shortness of Breath. Not Present- Chest Pain, Difficulty Breathing Lying Down, Leg Cramps, Palpitations, Rapid Heart Rate and Swelling of Extremities. Gastrointestinal Present- Chronic diarrhea. Not Present- Abdominal Pain, Bloating, Bloody Stool, Change in Bowel Habits, Constipation, Difficulty Swallowing, Excessive gas, Gets full quickly at meals, Hemorrhoids, Indigestion, Nausea, Rectal Pain and Vomiting. Male Genitourinary Not Present- Frequency, Nocturia, Painful Urination, Pelvic Pain and Urgency. Musculoskeletal Not Present- Back Pain, Joint Pain, Joint Stiffness, Muscle Pain, Muscle Weakness and Swelling of Extremities. Neurological Not Present- Decreased Memory, Fainting, Headaches, Numbness, Seizures, Tingling, Tremor, Trouble walking and Weakness. Psychiatric Not Present- Anxiety, Bipolar, Change in Sleep Pattern, Depression, Fearful and Frequent crying. Hematology Not Present- Easy Bruising, Excessive bleeding, Gland problems, HIV and Persistent Infections.  Vitals Elbert Ewings CMA; 04/15/2015 2:09 PM) 04/15/2015 2:09 PM Weight: 248 lb Height: 74in Body Surface Area: 2.38 m Body Mass Index: 31.84 kg/m  Temp.: 97.54F  Pulse: 70 (Regular)  BP: 140/78 (Sitting, Left Arm, Standard)      Physical Exam Leighton Ruff MD; 0000000 2:44 PM)  General Mental Status-Alert. General Appearance-Not in acute distress. Build & Nutrition-Well  nourished. Posture-Normal posture. Gait-Normal.  Head and Neck Head-normocephalic, atraumatic with no lesions or palpable masses. Trachea-midline. Thyroid Gland Characteristics - normal size and consistency and no palpable nodules.  Chest and Lung Exam Chest and lung exam reveals -on auscultation, normal breath sounds, no adventitious sounds  and normal vocal resonance.  Cardiovascular Cardiovascular examination reveals -normal heart sounds, regular rate and rhythm with no murmurs.  Abdomen Inspection Inspection of the abdomen reveals - No Hernias. Palpation/Percussion Palpation and Percussion of the abdomen reveal - Soft, Non Tender, No Rigidity (guarding), No hepatosplenomegaly and No Palpable abdominal masses.  Rectal Anorectal Exam External - Note: 2 small areas at posterior midline draining purulence, no fluctuant masses.  Neurologic Neurologic evaluation reveals -alert and oriented x 3 with no impairment of recent or remote memory, normal attention span and ability to concentrate, normal sensation and normal coordination.  Musculoskeletal Normal Exam - Bilateral-Upper Extremity Strength Normal and Lower Extremity Strength Normal.    Assessment & Plan Leighton Ruff MD; 0000000 2:30 PM)  ANAL FISTULA (K60.3) Impression: 55 year old male who presents to the office following ED visit for large perirectal abscess. He was given antibiotics and most of his symptoms have resolved. He continues down. The drainage on a daily basis. He has a history of an abscess in 1998 as well. On exam he has 2 openings at posterior midline that appeared to be extruding purulence. There is no fluctuant masses. I believe this represents a fistula. I have recommended an exam under anesthesia with possible fistulotomy versus seton placement. Patient is undergoing workup for lung cancer and may be going on chemotherapy or going under surgery shortly.

## 2015-04-15 NOTE — Telephone Encounter (Signed)
Oncology Nurse Navigator Documentation  Oncology Nurse Navigator Flowsheets 04/15/2015  Navigator Encounter Type Telephone  Telephone Outgoing Call  Treatment Phase Follow-up  Interventions Other  Acuity Level 1  Time Spent with Patient 15   I called to follow up with Mr. Fernando [name] and to see how he was feeling.  I left vm message with my name and phone number to call if needed.

## 2015-04-21 ENCOUNTER — Encounter (HOSPITAL_COMMUNITY): Payer: Self-pay | Admitting: *Deleted

## 2015-04-21 NOTE — Progress Notes (Signed)
Pt denies cardiac history, chest pain. Does have sob at times with exertion.Marland Kitchen He was worked up by cardiologist, Dr. Dorris Carnes for pre-operative clearance. He has stopped smoking.   Cath - 2011 - in [facility] Echo - 04/13/15 - in [facility] EKG - 04/12/15 - in [facility]

## 2015-04-22 ENCOUNTER — Ambulatory Visit (HOSPITAL_COMMUNITY)
Admission: RE | Admit: 2015-04-22 | Discharge: 2015-04-22 | Disposition: A | Discharge status: Home / Self Care | Payer: Self-pay | Source: Ambulatory Visit | Attending: Thoracic Surgery (Cardiothoracic Vascular Surgery) | Admitting: Thoracic Surgery (Cardiothoracic Vascular Surgery)

## 2015-04-22 ENCOUNTER — Ambulatory Visit (HOSPITAL_COMMUNITY): Payer: MEDICAID

## 2015-04-22 ENCOUNTER — Ambulatory Visit (HOSPITAL_COMMUNITY): Payer: Self-pay

## 2015-04-22 ENCOUNTER — Encounter (HOSPITAL_COMMUNITY)
Admission: RE | Disposition: A | Discharge status: Home / Self Care | Payer: Self-pay | Source: Ambulatory Visit | Attending: Thoracic Surgery (Cardiothoracic Vascular Surgery)

## 2015-04-22 ENCOUNTER — Encounter (HOSPITAL_COMMUNITY): Payer: Self-pay | Admitting: *Deleted

## 2015-04-22 ENCOUNTER — Ambulatory Visit (HOSPITAL_COMMUNITY): Payer: Self-pay | Admitting: Certified Registered"

## 2015-04-22 ENCOUNTER — Ambulatory Visit (HOSPITAL_COMMUNITY): Payer: MEDICAID | Admitting: Certified Registered"

## 2015-04-22 DIAGNOSIS — Z419 Encounter for procedure for purposes other than remedying health state, unspecified: Secondary | ICD-10-CM

## 2015-04-22 DIAGNOSIS — R911 Solitary pulmonary nodule: Secondary | ICD-10-CM | POA: Insufficient documentation

## 2015-04-22 DIAGNOSIS — R0789 Other chest pain: Secondary | ICD-10-CM | POA: Insufficient documentation

## 2015-04-22 DIAGNOSIS — R918 Other nonspecific abnormal finding of lung field: Secondary | ICD-10-CM

## 2015-04-22 DIAGNOSIS — R590 Localized enlarged lymph nodes: Secondary | ICD-10-CM | POA: Insufficient documentation

## 2015-04-22 DIAGNOSIS — Z87891 Personal history of nicotine dependence: Secondary | ICD-10-CM | POA: Insufficient documentation

## 2015-04-22 DIAGNOSIS — Z7951 Long term (current) use of inhaled steroids: Secondary | ICD-10-CM | POA: Insufficient documentation

## 2015-04-22 DIAGNOSIS — I10 Essential (primary) hypertension: Secondary | ICD-10-CM | POA: Insufficient documentation

## 2015-04-22 DIAGNOSIS — I2510 Atherosclerotic heart disease of native coronary artery without angina pectoris: Secondary | ICD-10-CM | POA: Insufficient documentation

## 2015-04-22 DIAGNOSIS — Z79899 Other long term (current) drug therapy: Secondary | ICD-10-CM | POA: Insufficient documentation

## 2015-04-22 HISTORY — DX: Pneumonia, unspecified organism: J18.9

## 2015-04-22 HISTORY — PX: VIDEO BRONCHOSCOPY WITH ENDOBRONCHIAL ULTRASOUND: SHX6177

## 2015-04-22 HISTORY — DX: Reserved for inherently not codable concepts without codable children: IMO0001

## 2015-04-22 HISTORY — DX: Other fecal abnormalities: R19.5

## 2015-04-22 HISTORY — DX: Umbilical hernia without obstruction or gangrene: K42.9

## 2015-04-22 LAB — COMPREHENSIVE METABOLIC PANEL
ALBUMIN: 4.2 g/dL (ref 3.5–5.0)
ALT: 39 U/L (ref 17–63)
AST: 39 U/L (ref 15–41)
Alkaline Phosphatase: 75 U/L (ref 38–126)
Anion gap: 14 (ref 5–15)
BILIRUBIN TOTAL: 1 mg/dL (ref 0.3–1.2)
BUN: 14 mg/dL (ref 6–20)
CHLORIDE: 100 mmol/L — AB (ref 101–111)
CO2: 24 mmol/L (ref 22–32)
CREATININE: 1.14 mg/dL (ref 0.61–1.24)
Calcium: 9 mg/dL (ref 8.9–10.3)
GFR calc Af Amer: >60 mL/min (ref >60)
GFR calc non Af Amer: >60 mL/min (ref >60)
GLUCOSE: 105 mg/dL — AB (ref 65–99)
POTASSIUM: 3.6 mmol/L (ref 3.5–5.1)
Sodium: 138 mmol/L (ref 135–145)
Total Protein: 8 g/dL (ref 6.5–8.1)

## 2015-04-22 LAB — CBC
HEMATOCRIT: 45.4 % (ref 39.0–52.0)
Hemoglobin: 16.2 g/dL (ref 13.0–17.0)
MCH: 33.3 pg (ref 26.0–34.0)
MCHC: 35.7 g/dL (ref 30.0–36.0)
MCV: 93.4 fL (ref 78.0–100.0)
PLATELETS: 241 10*3/uL (ref 150–400)
RBC: 4.86 MIL/uL (ref 4.22–5.81)
RDW: 14.3 % (ref 11.5–15.5)
WBC: 6.9 10*3/uL (ref 4.0–10.5)

## 2015-04-22 LAB — PROTIME-INR
INR: 1.02 (ref 0.00–1.49)
Prothrombin Time: 13.6 seconds (ref 11.6–15.2)

## 2015-04-22 LAB — APTT: APTT: 27 s (ref 24–37)

## 2015-04-22 SURGERY — BRONCHOSCOPY, WITH EBUS
Anesthesia: General | Site: Bronchus

## 2015-04-22 MED ORDER — EPINEPHRINE HCL 1 MG/ML IJ SOLN
INTRAMUSCULAR | Status: DC | PRN
  Administered 2015-04-22: 1 mg via ENDOTRACHEOPULMONARY

## 2015-04-22 MED ORDER — EPINEPHRINE HCL 1 MG/ML IJ SOLN
INTRAMUSCULAR | Status: AC
  Filled 2015-04-22: qty 1

## 2015-04-22 MED ORDER — FENTANYL CITRATE (PF) 250 MCG/5ML IJ SOLN
INTRAMUSCULAR | Status: DC | PRN
  Administered 2015-04-22 (×2): 50 ug via INTRAVENOUS
  Administered 2015-04-22: 75 ug via INTRAVENOUS

## 2015-04-22 MED ORDER — ROCURONIUM BROMIDE 100 MG/10ML IV SOLN
INTRAVENOUS | Status: DC | PRN
  Administered 2015-04-22: 30 mg via INTRAVENOUS
  Administered 2015-04-22: 25 mg via INTRAVENOUS

## 2015-04-22 MED ORDER — SUGAMMADEX SODIUM 200 MG/2ML IV SOLN
INTRAVENOUS | Status: AC
  Filled 2015-04-22: qty 2

## 2015-04-22 MED ORDER — SUGAMMADEX SODIUM 200 MG/2ML IV SOLN
INTRAVENOUS | Status: DC | PRN
  Administered 2015-04-22: 200 mg via INTRAVENOUS

## 2015-04-22 MED ORDER — PROPOFOL 10 MG/ML IV BOLUS
INTRAVENOUS | Status: AC
  Filled 2015-04-22: qty 20

## 2015-04-22 MED ORDER — MIDAZOLAM HCL 2 MG/2ML IJ SOLN
INTRAMUSCULAR | Status: AC
  Filled 2015-04-22: qty 2

## 2015-04-22 MED ORDER — PHENYLEPHRINE HCL 10 MG/ML IJ SOLN
INTRAMUSCULAR | Status: DC | PRN
  Administered 2015-04-22 (×2): 120 ug via INTRAVENOUS

## 2015-04-22 MED ORDER — ONDANSETRON HCL 4 MG/2ML IJ SOLN
INTRAMUSCULAR | Status: DC | PRN
  Administered 2015-04-22: 4 mg via INTRAVENOUS

## 2015-04-22 MED ORDER — PROPOFOL 10 MG/ML IV BOLUS
INTRAVENOUS | Status: DC | PRN
  Administered 2015-04-22: 50 mg via INTRAVENOUS
  Administered 2015-04-22: 150 mg via INTRAVENOUS

## 2015-04-22 MED ORDER — FENTANYL CITRATE (PF) 100 MCG/2ML IJ SOLN: 25.0000 ug | INTRAMUSCULAR | Status: DC | PRN

## 2015-04-22 MED ORDER — DEXAMETHASONE SODIUM PHOSPHATE 4 MG/ML IJ SOLN
INTRAMUSCULAR | Status: DC | PRN
  Administered 2015-04-22: 4 mg via INTRAVENOUS

## 2015-04-22 MED ORDER — 0.9 % SODIUM CHLORIDE (POUR BTL) OPTIME
TOPICAL | Status: DC | PRN
  Administered 2015-04-22: 1000 mL

## 2015-04-22 MED ORDER — LACTATED RINGERS IV SOLN
INTRAVENOUS | Status: DC
  Administered 2015-04-22 (×2): via INTRAVENOUS

## 2015-04-22 MED ORDER — FENTANYL CITRATE (PF) 250 MCG/5ML IJ SOLN
INTRAMUSCULAR | Status: AC
  Filled 2015-04-22: qty 5

## 2015-04-22 MED ORDER — PHENYLEPHRINE HCL 10 MG/ML IJ SOLN
10.0000 mg | INTRAVENOUS | Status: DC | PRN
  Administered 2015-04-22: 50 ug/min via INTRAVENOUS

## 2015-04-22 MED ORDER — LIDOCAINE HCL (CARDIAC) 20 MG/ML IV SOLN
INTRAVENOUS | Status: AC
  Filled 2015-04-22: qty 10

## 2015-04-22 MED ORDER — MIDAZOLAM HCL 5 MG/5ML IJ SOLN
INTRAMUSCULAR | Status: DC | PRN
  Administered 2015-04-22: 2 mg via INTRAVENOUS

## 2015-04-22 SURGICAL SUPPLY — 40 items
BALL CTTN LRG ABS STRL LF (GAUZE/BANDAGES/DRESSINGS)
BRUSH CYTOL CELLEBRITY 1.5X140 (MISCELLANEOUS) ×2 IMPLANT
CANISTER SUCTION 2500CC (MISCELLANEOUS) ×3 IMPLANT
CONT SPEC 4OZ CLIKSEAL STRL BL (MISCELLANEOUS) ×7 IMPLANT
COTTONBALL LRG STERILE PKG (GAUZE/BANDAGES/DRESSINGS) IMPLANT
COVER DOME SNAP 22 D (MISCELLANEOUS) ×3 IMPLANT
COVER TABLE BACK 60X90 (DRAPES) ×3 IMPLANT
FILTER STRAW FLUID ASPIR (MISCELLANEOUS) ×2 IMPLANT
FORCEPS BIOP RJ4 1.8 (CUTTING FORCEPS) IMPLANT
FORCEPS RADIAL JAW LRG 4 PULM (INSTRUMENTS) IMPLANT
GAUZE SPONGE 4X4 12PLY STRL (GAUZE/BANDAGES/DRESSINGS) IMPLANT
GLOVE SURG SIGNA 7.5 PF LTX (GLOVE) ×5 IMPLANT
GOWN STRL REUS W/ TWL XL LVL3 (GOWN DISPOSABLE) ×1 IMPLANT
GOWN STRL REUS W/TWL XL LVL3 (GOWN DISPOSABLE) ×3
KIT CLEAN ENDO COMPLIANCE (KITS) ×6 IMPLANT
KIT ROOM TURNOVER OR (KITS) ×3 IMPLANT
MARKER SKIN DUAL TIP RULER LAB (MISCELLANEOUS) ×3 IMPLANT
NDL BIOPSY TRANSBRONCH 21G (NEEDLE) IMPLANT
NDL BLUNT 18X1 FOR OR ONLY (NEEDLE) IMPLANT
NDL EBUS SONO TIP PENTAX (NEEDLE) IMPLANT
NEEDLE 22X1 1/2 (OR ONLY) (NEEDLE) IMPLANT
NEEDLE BIOPSY TRANSBRONCH 21G (NEEDLE) IMPLANT
NEEDLE BLUNT 18X1 FOR OR ONLY (NEEDLE) IMPLANT
NEEDLE EBUS SONO TIP PENTAX (NEEDLE) ×3 IMPLANT
NEEDLE SONO TIP II EBUS (NEEDLE) ×3 IMPLANT
NS IRRIG 1000ML POUR BTL (IV SOLUTION) ×3 IMPLANT
OIL SILICONE PENTAX (PARTS (SERVICE/REPAIRS)) IMPLANT
PAD ARMBOARD 7.5X6 YLW CONV (MISCELLANEOUS) ×6 IMPLANT
RADIAL JAW LRG 4 PULMONARY (INSTRUMENTS) ×2
SYR 20CC LL (SYRINGE) ×3 IMPLANT
SYR 20ML ECCENTRIC (SYRINGE) ×3 IMPLANT
SYR 3ML LL SCALE MARK (SYRINGE) ×2 IMPLANT
SYR 5ML LL (SYRINGE) ×3 IMPLANT
SYR 5ML LUER SLIP (SYRINGE) ×3 IMPLANT
SYR CONTROL 10ML LL (SYRINGE) IMPLANT
SYRINGE 60CC LL (MISCELLANEOUS) ×2 IMPLANT
TOWEL OR 17X24 6PK STRL BLUE (TOWEL DISPOSABLE) ×3 IMPLANT
TRAP SPECIMEN MUCOUS 40CC (MISCELLANEOUS) ×3 IMPLANT
TUBE CONNECTING 20'X1/4 (TUBING) ×1
TUBE CONNECTING 20X1/4 (TUBING) ×2 IMPLANT

## 2015-04-22 NOTE — Transfer of Care (Signed)
Immediate Anesthesia Transfer of Care Note  Patient: Fernando [name]  Procedure(s) Performed: Procedure(s): VIDEO BRONCHOSCOPY WITH ENDOBRONCHIAL ULTRASOUND (N/A)  Patient Location: PACU  Anesthesia Type:General  Level of Consciousness: awake, alert , oriented and patient cooperative  Airway & Oxygen Therapy: Patient Spontanous Breathing and Patient connected to nasal cannula oxygen  Post-op Assessment: Report given to RN, Post -op Vital signs reviewed and stable and Patient moving all extremities  Post vital signs: Reviewed and stable  Last Vitals:  Filed Vitals:   04/22/15 0731  BP: 176/98  Pulse: 89  Temp: 36.6 C  Resp: 18    Complications: No apparent anesthesia complications

## 2015-04-22 NOTE — Discharge Instructions (Signed)
Do not drive or engage in heavy physical activity for 24 hours  You may resume normal activities tomorrow  My office will contact you with a follow up appointment for next week  You will probably cough up small amounts of blood over the next several days  Call [phone] if you develop chest pain, shortness of breath, fever > 101F or cough up large amounts of blood (more than 1/4 of a cup)

## 2015-04-22 NOTE — Anesthesia Preprocedure Evaluation (Addendum)
Anesthesia Evaluation  Patient identified by MRN, date of birth, ID band Patient awake    Reviewed: Allergy & Precautions, NPO status , Patient's Chart, lab work & pertinent test results  Airway Mallampati: II  TM Distance: >3 FB Neck ROM: Full    Dental   Pulmonary shortness of breath, pneumonia, former smoker,    breath sounds clear to auscultation       Cardiovascular hypertension,  Rhythm:Regular Rate:Normal     Neuro/Psych    GI/Hepatic negative GI ROS, Neg liver ROS,   Endo/Other  negative endocrine ROS  Renal/GU negative Renal ROS     Musculoskeletal   Abdominal   Peds  Hematology   Anesthesia Other Findings   Reproductive/Obstetrics                            Anesthesia Physical Anesthesia Plan  ASA: III  Anesthesia Plan: General   Post-op Pain Management:    Induction: Intravenous  Airway Management Planned: Oral ETT  Additional Equipment:   Intra-op Plan:   Post-operative Plan: Post-operative intubation/ventilation  Informed Consent: I have reviewed the patients History and Physical, chart, labs and discussed the procedure including the risks, benefits and alternatives for the proposed anesthesia with the patient or authorized representative who has indicated his/her understanding and acceptance.   Dental advisory given  Plan Discussed with: CRNA and Anesthesiologist  Anesthesia Plan Comments:         Anesthesia Quick Evaluation

## 2015-04-22 NOTE — Interval H&P Note (Signed)
History and Physical Interval Note:  04/22/2015 10:19 AM  Fernando [name]  has presented today for surgery, with the diagnosis of LLL MASS  The various methods of treatment have been discussed with the patient and family. After consideration of risks, benefits and other options for treatment, the patient has consented to  Procedure(s): [place] (N/A) as a surgical intervention .  The patient's history has been reviewed, patient examined, no change in status, stable for surgery.  I have reviewed the patient's chart and labs.  Questions were answered to the patient's satisfaction.     Melrose Nakayama

## 2015-04-22 NOTE — H&P (View-Only) (Signed)
PCP is Collene Mares, PA-C Referring Provider is Curt Bears, MD  No chief complaint on file.   HPI: Fernando [name] is sent for consultation re: a left lower lobe mass.  He is a 55 yo man with a history of tobacco abuse (1.5 ppd x 15 years, quit one month ago.). He also has a history of hypertension, some degree of CAD managed medically, cervical fusion, and multiple perirectal abscesses/ scrotal abscess. He recently was having perirectal pain and constipation. He was seen at [facility]. As preparation for possible surgery he had a CXR which showed an opacity in the left lower lobe. The perirectal inflammation was managed nonoperatively. He was treated with antibiotics.  He saw Dr. Luan Pulling and a CT showed nodular consolidation in the left lower lobe. A PET CT showed the area was hypermetabolic and there was a hypermetabolic lymph node in the left hilum.  He has had a cough, mostly nonproductive but he has seen blood on a couple of occassions. He denies headaches or visual changes. He does complain of chest tightness and shortness of breath with exertion that has worsened over the past 2-3 weeks, which he attributes to quitting smoking. He has lost 10 pounds in the past 3 months. He has been off antibiotics for over a week and the pain and inability to defecate have worsened over the past couple of days. He denies HA or visual changes. 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  Zubrod Score: At the time of surgery this patient's most appropriate activity status/level should be described as: []     0    Normal activity, no symptoms [x]     1    Restricted in physical strenuous activity but ambulatory, able to do out light work []     2    Ambulatory and capable of self care, unable to do work activities, up and about >50 % of waking hours                              []     3    Only limited self care, in bed greater than 50% of waking hours []     4    Completely  disabled, no self care, confined to bed or chair []     5    Moribund   Past Medical History  Diagnosis Date  . Hypertension 12/30/2011  . Allergy   . Peri-rectal abscess     multiple.   . Tobacco abuse     Past Surgical History  Procedure Laterality Date  . Cervical fusion    . Facial cosmetic surgery    . Knee arthroscopy    . Appendectomy    . Hernia repair    . Spine surgery    . Cystoscopy N/A 05/01/2013    Procedure: CYSTOSCOPY;  Surgeon: Marissa Nestle, MD;  Location: [facility];  Service: Urology;  Laterality: N/A;  . Incision and drainage abscess Right 05/01/2013    Procedure: INCISION AND DRAINAGE RIGHT SCROTAL ABSCESS;  Surgeon: Marissa Nestle, MD;  Location: [facility];  Service: Urology;  Laterality: Right;    Family History  Problem Relation Age of Onset  . Breast cancer Mother     Social History Social History  Substance Use Topics  . Smoking status: Current Every Day Smoker -- 1.50 packs/day for 15 years    Types: Cigarettes  . Smokeless  tobacco: None     Comment: Quit one month ago  . Alcohol Use: No     Comment: occ    Current Outpatient Prescriptions  Medication Sig Dispense Refill  . albuterol (PROVENTIL HFA;VENTOLIN HFA) 108 (90 Base) MCG/ACT inhaler Inhale 2 puffs into the lungs every 6 (six) hours as needed for wheezing or shortness of breath. 1 Inhaler 12  . ALPRAZolam (XANAX) 1 MG tablet Take 1 tablet (1 mg total) by mouth 3 (three) times daily as needed for anxiety. 30 tablet 0  . budesonide-formoterol (SYMBICORT) 80-4.5 MCG/ACT inhaler Inhale 2 puffs into the lungs 2 (two) times daily. 1 Inhaler 12  . HYDROcodone-acetaminophen (NORCO) 5-325 MG tablet Take 1 tablet by mouth every 6 (six) hours as needed for moderate pain. 30 tablet 0  . levofloxacin (LEVAQUIN) 500 MG tablet Take 1 tablet (500 mg total) by mouth daily. X 2 weeks 14 tablet 0  . lisinopril-hydrochlorothiazide (PRINZIDE,ZESTORETIC) 20-12.5 MG tablet Take 1 tablet by mouth daily. 30  tablet 3  . metroNIDAZOLE (FLAGYL) 500 MG tablet Take 1 tablet (500 mg total) by mouth 3 (three) times daily. X 2 weeks 42 tablet 0  . promethazine (PHENERGAN) 25 MG tablet Take 1 tablet (25 mg total) by mouth every 6 (six) hours as needed for nausea or vomiting. 30 tablet 0  . saccharomyces boulardii (FLORASTOR) 250 MG capsule Take 1 capsule (250 mg total) by mouth 2 (two) times daily. 60 capsule 0   No current facility-administered medications for this visit.    Allergies  Allergen Reactions  . Morphine And Related Other (See Comments)    hallucinations    Review of Systems  Constitutional: Positive for diaphoresis and fatigue. Negative for fever, chills and unexpected weight change.  HENT: Negative for trouble swallowing and voice change.   Eyes: Negative for visual disturbance.  Respiratory: Positive for cough and shortness of breath. Negative for wheezing.   Cardiovascular: Positive for chest pain.  Gastrointestinal: Positive for constipation and rectal pain. Negative for blood in stool.  Endocrine: Negative for polydipsia and polyuria.  Genitourinary: Positive for scrotal swelling. Negative for dysuria and difficulty urinating.  Musculoskeletal: Negative for arthralgias and gait problem.  Neurological: Negative for syncope, weakness and headaches.  Hematological: Negative for adenopathy. Does not bruise/bleed easily.  All other systems reviewed and are negative.   There were no vitals taken for this visit. Physical Exam  Constitutional: He is oriented to person, place, and time. He appears well-developed and well-nourished. No distress.  HENT:  Head: Normocephalic and atraumatic.  Eyes: Conjunctivae and EOM are normal. No scleral icterus.  Neck: Neck supple. No thyromegaly present.  Cardiovascular: Normal rate, regular rhythm, normal heart sounds and intact distal pulses.  Exam reveals no gallop and no friction rub.   No murmur heard. Pulmonary/Chest: Effort normal. He has  wheezes (faint right base). He has no rales.  Abdominal: Soft. There is no tenderness.  Musculoskeletal: He exhibits no edema.  Lymphadenopathy:    He has no cervical adenopathy.  Neurological: He is alert and oriented to person, place, and time. No cranial nerve deficit.  Motor intact  Skin: Skin is warm and dry.  Vitals reviewed.    Diagnostic Tests: CT CHEST, ABDOMEN, AND PELVIS WITH CONTRAST  TECHNIQUE: Multidetector CT imaging of the chest, abdomen and pelvis was performed following the standard protocol during bolus administration of intravenous contrast.  CONTRAST: 146mL OMNIPAQUE IOHEXOL 300 MG/ML SOLN  COMPARISON: CT scan abdomen and pelvis 03/07/2015 and CT of the  chest and abdomen 08/17/2009.  FINDINGS: CT CHEST  Sagittal images of the spine shows degenerative changes thoracic spine. Sagittal view of the sternum is unremarkable. Images of the thoracic inlet are unremarkable. Central airways are patent. No mediastinal hematoma or adenopathy. Heart size within normal limits. No pericardial effusion. There is no hilar adenopathy.  Central thoracic aorta and pulmonary artery is unremarkable. Mild centrilobular emphysematous changes are noted bilateral upper lobe.  As noted on recent CT scan of the abdomen again noted multilobular somewhat clustered nodular consolidation in left lower lobe centrally posteriorly infrahilar region. This is best seen in axial image 33 measures at least 3.3 by 2.8 cm. This is confirmed on coronal image 81. This is highly suspicious for malignancy. Further correlation with PET scan, bronchoscopy and/or biopsy is recommended. Minimal peripheral postobstructive changes noted in left lower lobe posterior see axial image 34. A pleural based nodule in left lower lobe measures 7 mm please see axial image 24. No destructive rib lesions are noted.  No segmental infiltrate or pulmonary edema. There is no pneumothorax.  CT  ABDOMEN AND PELVIS  Enhanced liver is unremarkable. No focal hepatic mass. No calcified gallstones are noted within gallbladder. The pancreas, spleen and adrenal glands are unremarkable.  Abdominal aorta is unremarkable. No aortic aneurysm.  Kidneys are symmetrical in size and enhancement. No hydronephrosis or hydroureter.  Delayed renal images shows bilateral renal symmetrical excretion. Bilateral visualized proximal ureter is unremarkable.  There is no small bowel obstruction. No ascites or free air. No adenopathy. No pericecal inflammation. The patient is status post appendectomy. The terminal ileum is unremarkable. Prostate gland and seminal vesicles are unremarkable. Again noted fat containing left inguinal scrotal canal hernia without evidence of acute complication.  There is a right inguinal lymph node measures 1.5 cm in short-axis. A left inguinal lymph node measures 1.7 cm in short-axis. These are borderline enlarged probable reactive.  Axial image 127 again noted inflammatory process surrounding the distal rectum and perianal region. Again noted small perianal fluid collection consistent with abscess measures about 3.2 by 1.3 by 1.4 cm.  Sagittal images of the spine shows again bilateral pars defect at L5 level. Stable facet degenerative changes L4 and L5 level.  IMPRESSION: 1. There is somewhat lobulated clustered nodular consolidation in left lower lobe centrally infrahilar region. This measures at least 3.3 x 2.8 cm. This is highly suspicious for malignancy. Further correlation with PET scan, bronchoscopy and/or biopsy is recommended. Small parenchymal postobstructive changes noted in left lower lobe posterior to the lesion, please see axial image 35. A pleural based nodule in left lower lobe posteriorly measures 7 mm. 2. Mild emphysematous changes bilateral upper lobe. 3. No mediastinal or hilar adenopathy. 4. Again noted perirectal and perianal  inflammatory changes with a perianal abscess measures 3.2 x 1.3 x 1.4 cm without significant change from prior exam. No air-fluid level is noted within abscess. 5. No hydronephrosis or hydroureter. 6. Status post appendectomy. No pericecal inflammation. Unremarkable terminal ileum. 7. No small bowel obstruction. 8. Borderline bilateral inguinal lymph nodes probable reactive. 9. Stable degenerative changes lumbar spine.   Electronically Signed  By: Lahoma Crocker M.D.  On: 03/10/2015 13:10        NUCLEAR MEDICINE PET SKULL BASE TO THIGH  TECHNIQUE: 12.45 mCi F-18 FDG was injected intravenously. Full-ring PET imaging was performed from the skull base to thigh after the radiotracer. CT data was obtained and used for attenuation correction and anatomic localization.  FASTING BLOOD GLUCOSE: Value: 94 mg/dl  COMPARISON: CT the chest, abdomen and pelvis 03/10/2015.  FINDINGS: NECK  Enlargement and hypermetabolism (SUVmax = 12.6)in the region of the pharyngeal tonsils bilaterally, favored to be physiologic, presumably related to underlying infection. No suspicious-appearing hypermetabolic lymph nodes in the neck.  CHEST  Poorly defined macrolobulated mass in the central aspect of the basal segments of the left lower lobe (image 84 of series 4) estimated to measure approximately 3.7 x 2.7 cm demonstrates hypermetabolism (SUVmax = 13.5), highly concerning for primary bronchogenic malignancy. Hypermetabolic (SUVmax = A999333 tissue in the left hilar region likely reflects underlying lymphadenopathy, although this is not measurable on today's noncontrast CT images. No hypermetabolic mediastinal or right hilar lymphadenopathy noted. No other suspicious appearing pulmonary nodules or masses are noted. No acute consolidative airspace disease. No pleural effusions. There are several areas of low-level hypermetabolism in the mid to lower esophagus, most notably just  before the gastroesophageal junction (SUVmax = 5.5)where there appears to be some mild asymmetric mural thickening (image 93 of series 4). Heart size is normal. There is no significant pericardial fluid, thickening or pericardial calcification. There is atherosclerosis of the thoracic aorta, the great vessels of the mediastinum and the coronary arteries, including calcified atherosclerotic plaque in the left anterior descending coronary artery. Mild calcifications of the mitral annulus.  ABDOMEN/PELVIS  No abnormal hypermetabolic activity within the liver, pancreas, adrenal glands, or spleen. No hypermetabolic lymph nodes in the abdomen. No significant volume of ascites. No pneumoperitoneum. No pathologic distention of small bowel or colon. There is again some asymmetric soft tissue prominence posterior to the anorectal junction (image 205 of series 4) where there is a mass-like area that demonstrates extensive hypermetabolism (SUVmax = 232.9), corresponding to the perianal abscess noted on prior examinations. There are borderline enlarged and mildly enlarged external iliac and inguinal lymph nodes bilaterally measuring up to 14 mm in short axis in the left inguinal region, demonstrating hypermetabolism (SUVmax = 4.4-6.4).  SKELETON  No focal hypermetabolic activity to suggest skeletal metastasis.  IMPRESSION: 1. 3.7 x 2.7 cm left lower lobe hypermetabolic mass with hypermetabolic left hilar nodal tissue, concerning for primary bronchogenic malignancy with left hilar nodal involvement. No definite evidence of metastatic disease elsewhere in the neck, chest, abdomen or pelvis. This lesion should be accessible by bronchoscopy for biopsy for further diagnostic and staging purposes. 2. Extensive mass-like soft tissue thickening and hypermetabolism in the posterior perianal region, corresponding to the previously diagnosed perianal abscess. Lymphadenopathy in the pelvis  is presumably reactive. 3. There is also some enlargement and hypermetabolism in the region of the pharyngeal tonsils bilaterally which is relatively symmetric, favored to be physiologic, likely related under low acute infection. Clinical correlation is recommended. 4. Atherosclerosis, including left anterior descending coronary artery disease. Please note that although the presence of coronary artery calcium documents the presence of coronary artery disease, the severity of this disease and any potential stenosis cannot be assessed on this non-gated CT examination. Assessment for potential risk factor modification, dietary therapy or pharmacologic therapy may be warranted, if clinically indicated. 5. Additional incidental findings, as above.   Electronically Signed  By: Vinnie Langton M.D.  On: 03/16/2015 10:35  I personally reviewed the CT and PET CT and concur with the findings noted above  Impression:  55 yo man with a modest smoking history who was found to have a left lower lobe mass/ consolidation while being worked up for perirectal issues. He was having a cough and some hemoptysis as well, but those were not his  primary complaints.  He has a 3.7 x 2.7 cm mass in the left lower lobe with a hilar node that is hypermetabolic as well. 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 This has to be considered lung cancer unless it can be proven otherwise. Other considerations in the differential diagnosis include infectious or inflammatory conditions.  He has several issues to deal with. His primary complaints remain rectal pain and difficulty defecating. He says that has worsened significantly in the past week. I will restart his antibiotics (levofloxacin and metronidazole) until he can have that re-evaluated.   He also is having significant chest tightness and shortness of breath with exertion over the past couple of weeks. He attributes it to quitting  smoking, but it sounds anginal in nature. He has had a cath in the past which showed some CAD but was treated medically. He has not seen a Cardiologist in many years, so I think should see one prior to elective surgery.  I think the best initial option for Fernando [name] is a English as a second language teacher and EBUS. Hopefully, that will give [place] a definitive diagnosis. The left lower lobe lesion would require a lobectomy to remove so I think we need to at least attempt to biopsy it before proceeding with surgery.  He will need PFTs with and without bronchodilators prior to any resection.  After he is seen by Cardiology, we will plan to proceed with Bronchoscopy and EBUS. I discussed the general nature of the procedure with him. He understands it is an outpatient procedure, done in the OR under general anesthesia. He understands it is diagnostic and not therapeutic. He understands we may not be able to get a definitive diagnosis. I informed him of the indications, risks, benefits and alternatives. He understands the risks include those of general anesthesia, as well as bleeding, pneumothorax, failure to make a diagnosis, and the possibility of other unforeseeable complications.   He accepts the risks and wishes to proceed.  Plan:  Restart Levaquin and Flagyl PFTs Cardiology evaluation Bronchoscopy and EBUS to be scheduled once cleared by Cardiology  Melrose Nakayama, MD [facility] [phone]

## 2015-04-22 NOTE — Anesthesia Procedure Notes (Signed)
Procedure Name: Intubation Date/Time: 04/22/2015 10:45 AM Performed by: Myna Bright Pre-anesthesia Checklist: Patient identified, Emergency Drugs available, Suction available and Patient being monitored Patient Re-evaluated:Patient Re-evaluated prior to inductionOxygen Delivery Method: Circle system utilized Preoxygenation: Pre-oxygenation with 100% oxygen Intubation Type: IV induction Ventilation: Oral airway inserted - appropriate to patient size and Mask ventilation without difficulty Laryngoscope Size: Miller and 2 Grade View: Grade I Tube type: Oral Tube size: 9.0 mm Number of attempts: 2 Placement Confirmation: ETT inserted through vocal cords under direct vision,  breath sounds checked- equal and bilateral and positive ETCO2 Secured at: 25 cm Tube secured with: Tape Dental Injury: Teeth and Oropharynx as per pre-operative assessment  Comments: Intubation by Oletta Lamas, MD

## 2015-04-22 NOTE — Anesthesia Postprocedure Evaluation (Signed)
Anesthesia Post Note  Patient: Fernando [name]  Procedure(s) Performed: Procedure(s) (LRB): VIDEO BRONCHOSCOPY WITH ENDOBRONCHIAL ULTRASOUND (N/A)  Patient location during evaluation: PACU Level of consciousness: awake Pain management: pain level controlled Vital Signs Assessment: post-procedure vital signs reviewed and stable Respiratory status: spontaneous breathing Cardiovascular status: stable Anesthetic complications: no    Last Vitals:  Filed Vitals:   04/22/15 1315 04/22/15 1320  BP: 126/92 128/96  Pulse: 86 80  Temp: 36.8 C   Resp: 22     Last Pain:  Filed Vitals:   04/22/15 1333  PainSc: 0-No pain                 EDWARDS,Renardo Cheatum

## 2015-04-23 ENCOUNTER — Emergency Department (HOSPITAL_COMMUNITY): Payer: Self-pay

## 2015-04-23 ENCOUNTER — Emergency Department (HOSPITAL_COMMUNITY)
Admission: EM | Admit: 2015-04-23 | Discharge: 2015-04-24 | Discharge status: Against Medical Advice | Payer: Self-pay | Attending: Emergency Medicine | Admitting: Emergency Medicine

## 2015-04-23 ENCOUNTER — Encounter (HOSPITAL_COMMUNITY): Payer: Self-pay | Admitting: *Deleted

## 2015-04-23 DIAGNOSIS — R1084 Generalized abdominal pain: Secondary | ICD-10-CM

## 2015-04-23 DIAGNOSIS — R0789 Other chest pain: Secondary | ICD-10-CM

## 2015-04-23 DIAGNOSIS — Z87891 Personal history of nicotine dependence: Secondary | ICD-10-CM | POA: Insufficient documentation

## 2015-04-23 DIAGNOSIS — I10 Essential (primary) hypertension: Secondary | ICD-10-CM | POA: Insufficient documentation

## 2015-04-23 DIAGNOSIS — R0602 Shortness of breath: Secondary | ICD-10-CM | POA: Insufficient documentation

## 2015-04-23 DIAGNOSIS — Z79899 Other long term (current) drug therapy: Secondary | ICD-10-CM | POA: Insufficient documentation

## 2015-04-23 LAB — COMPREHENSIVE METABOLIC PANEL
ALBUMIN: 3.6 g/dL (ref 3.5–5.0)
ALK PHOS: 63 U/L (ref 38–126)
ALT: 39 U/L (ref 17–63)
ANION GAP: 11 (ref 5–15)
AST: 55 U/L — ABNORMAL HIGH (ref 15–41)
BILIRUBIN TOTAL: 0.4 mg/dL (ref 0.3–1.2)
BUN: 9 mg/dL (ref 6–20)
CALCIUM: 7.9 mg/dL — AB (ref 8.9–10.3)
CO2: 23 mmol/L (ref 22–32)
Chloride: 103 mmol/L (ref 101–111)
Creatinine, Ser: 0.9 mg/dL (ref 0.61–1.24)
GFR calc Af Amer: >60 mL/min (ref >60)
GLUCOSE: 134 mg/dL — AB (ref 65–99)
Potassium: 3 mmol/L — ABNORMAL LOW (ref 3.5–5.1)
Sodium: 137 mmol/L (ref 135–145)
Total Protein: 7.2 g/dL (ref 6.5–8.1)

## 2015-04-23 LAB — CBC WITH DIFFERENTIAL/PLATELET
Basophils Absolute: 0 10*3/uL (ref 0.0–0.1)
Basophils Relative: 0 %
Eosinophils Absolute: 0.2 10*3/uL (ref 0.0–0.7)
Eosinophils Relative: 3 %
HEMATOCRIT: 37.6 % — AB (ref 39.0–52.0)
HEMOGLOBIN: 12.7 g/dL — AB (ref 13.0–17.0)
LYMPHS ABS: 2.4 10*3/uL (ref 0.7–4.0)
LYMPHS PCT: 34 %
MCH: 31.6 pg (ref 26.0–34.0)
MCHC: 33.8 g/dL (ref 30.0–36.0)
MCV: 93.5 fL (ref 78.0–100.0)
MONO ABS: 0.6 10*3/uL (ref 0.1–1.0)
MONOS PCT: 8 %
NEUTROS ABS: 3.9 10*3/uL (ref 1.7–7.7)
NEUTROS PCT: 55 %
Platelets: 207 10*3/uL (ref 150–400)
RBC: 4.02 MIL/uL — ABNORMAL LOW (ref 4.22–5.81)
RDW: 14.1 % (ref 11.5–15.5)
WBC: 7.1 10*3/uL (ref 4.0–10.5)

## 2015-04-23 LAB — ACID FAST SMEAR (AFB): ACID FAST SMEAR - AFSCU2: NEGATIVE

## 2015-04-23 LAB — ACID FAST SMEAR (AFB, MYCOBACTERIA)

## 2015-04-23 MED ORDER — KETOROLAC TROMETHAMINE 30 MG/ML IJ SOLN
30.0000 mg | Freq: Once | INTRAMUSCULAR | Status: AC
  Administered 2015-04-23: 30 mg via INTRAVENOUS
  Filled 2015-04-23: qty 1

## 2015-04-23 MED ORDER — POTASSIUM CHLORIDE CRYS ER 20 MEQ PO TBCR
40.0000 meq | EXTENDED_RELEASE_TABLET | Freq: Once | ORAL | Status: AC
  Administered 2015-04-23: 40 meq via ORAL
  Filled 2015-04-23: qty 2

## 2015-04-23 MED ORDER — CYCLOBENZAPRINE HCL 10 MG PO TABS
10.0000 mg | ORAL_TABLET | Freq: Once | ORAL | Status: AC
  Administered 2015-04-23: 10 mg via ORAL
  Filled 2015-04-23: qty 1

## 2015-04-23 NOTE — ED Provider Notes (Signed)
CSN: [id number]     Arrival date & time 04/23/15  2154 History  By signing my name below, I, Terressa Koyanagi, attest that this documentation has been prepared under the direction and in the presence of No att. providers found at 23:30 PM . Electronically Signed: Terressa Koyanagi, ED Scribe. 04/24/2015. 11:45 PM.  Chief Complaint  Patient presents with  . Abdominal Pain   The history is provided by the patient. No language interpreter was used.   PCP: Collene Mares, PA-C HPI Comments: Fernando [name] is a 55 y.o. male, with PMHx noted below including HTN, tobacco use (quit date 03/12/15), ventricular bigeminy, anal fissure, stage II lung cancer, and who had a left heart cath in 2011,who presents to the Emergency Department, accompanied by his mother, complaining of sudden onset, sharp/stabbing, constant, severe stabbing  chest pain shooting into his abdomen and pressure onset at 9pm tonight. Associated Sx include constipation with last bowel movement 2 days ago; pt reports his last BM was hard. Aggravating factors include any movement including changing positions and coughing.  Associated Sx include worsening SOB (pt notes he has SOB at baseline), And hot flashes. Pt denies fever, n/v, cough. He states he's never had this pain before. He does report having a cardiac cath done in about the year 2010 that was normal.  Patient denies taking narcotics which could explain his constipation. His mother states he only takes narcotics when given to him IV in the hospital.  Patient was recently diagnosed with lung cancer. He states he had a PET scan done and he is stage II. He reports yesterday he had bronchoscopy done by Dr. Roxan Hockey to biopsy the lesion in his left lung. He states since he had that procedure he's having pain in his neck and his legs. He states he was having hemoptysis after the procedure however now that is gone.  PCP PA Collene Mares at [facility] Dr Harrington Challenger, seeing her because he states his aorta  is slightly enlarged  Past Medical History  Diagnosis Date  . Hypertension 12/30/2011  . Allergy   . Peri-rectal abscess     multiple.   . Tobacco abuse   . Shortness of breath dyspnea     with exertion  . Pneumonia   . Umbilical hernia     has been repaired  . Loose stools    Past Surgical History  Procedure Laterality Date  . Cervical fusion    . Facial cosmetic surgery    . Knee arthroscopy    . Appendectomy    . Hernia repair    . Spine surgery      cervical  . Cystoscopy N/A 05/01/2013    Procedure: CYSTOSCOPY;  Surgeon: Marissa Nestle, MD;  Location: [facility];  Service: Urology;  Laterality: N/A;  . Incision and drainage abscess Right 05/01/2013    Procedure: INCISION AND DRAINAGE RIGHT SCROTAL ABSCESS;  Surgeon: Marissa Nestle, MD;  Location: [facility];  Service: Urology;  Laterality: Right;   Family History  Problem Relation Age of Onset  . Breast cancer Mother    Social History  Substance Use Topics  . Smoking status: Former Smoker -- 1.50 packs/day for 15 years    Types: Cigarettes    Quit date: 03/12/2015  . Smokeless tobacco: Never Used  . 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  Review of Systems  Constitutional: Positive for diaphoresis. Negative for fever.  Respiratory: Positive for shortness of breath. Negative for cough.   Cardiovascular: Positive for chest pain.  Gastrointestinal: Positive for abdominal pain and constipation. Negative for nausea and vomiting.  All other systems reviewed and are negative.  Allergies  Morphine and related  Home Medications   Prior to Admission medications   Medication Sig Start Date End Date Taking? Authorizing Provider  albuterol (PROVENTIL HFA;VENTOLIN HFA) 108 (90 Base) MCG/ACT inhaler Inhale 2 puffs into the lungs every 6 (six) hours as needed for wheezing or shortness of breath.  03/12/15  Yes Ripudeep Krystal Eaton, MD  ALPRAZolam Duanne Moron) 1 MG tablet Take 1 tablet (1 mg total) by mouth 3 (three) times daily as needed for anxiety. 03/12/15  Yes Ripudeep Krystal Eaton, MD  budesonide-formoterol (SYMBICORT) 80-4.5 MCG/ACT inhaler Inhale 2 puffs into the lungs 2 (two) times daily. 03/12/15  Yes Ripudeep Krystal Eaton, MD  lisinopril-hydrochlorothiazide (PRINZIDE,ZESTORETIC) 20-12.5 MG tablet Take 1 tablet by mouth daily. 03/12/15  Yes Ripudeep Krystal Eaton, MD  promethazine (PHENERGAN) 25 MG tablet Take 1 tablet (25 mg total) by mouth every 6 (six) hours as needed for nausea or vomiting. 03/12/15  Yes Ripudeep Krystal Eaton, MD  saccharomyces boulardii (FLORASTOR) 250 MG capsule Take 1 capsule (250 mg total) by mouth 2 (two) times daily. 03/12/15  Yes Ripudeep Krystal Eaton, MD   Triage Vitals: BP 128/87 mmHg  Pulse 95  Temp(Src) 98.3 F (36.8 C) (Oral)  Resp 18  Wt 249 lb (112.946 kg)  SpO2 96%  Vital signs normal   Physical Exam  Constitutional: He is oriented to person, place, and time. He appears well-developed and well-nourished.  Non-toxic appearance. He does not appear ill. He appears distressed.  HENT:  Head: Normocephalic and atraumatic.  Right Ear: External ear normal.  Left Ear: External ear normal.  Nose: Nose normal. No mucosal edema or rhinorrhea.  Mouth/Throat: Oropharynx is clear and moist and mucous membranes are normal. No dental abscesses or uvula swelling.  Eyes: Conjunctivae and EOM are normal. Pupils are equal, round, and reactive to light.  Neck: Normal range of motion and full passive range of motion without pain. Neck supple.  Cardiovascular: Normal rate, regular rhythm and normal heart sounds.  Exam reveals no gallop and no friction rub.   No murmur heard. Pulmonary/Chest: Effort normal and breath sounds normal. No respiratory distress. He has no wheezes. He has no rhonchi. He has no rales. He exhibits no tenderness and no crepitus.    Pt clutches his chest in pain with change in positions.    Area of pain noted  Abdominal: Soft. Normal appearance and bowel sounds are normal. He exhibits distension. There is no tenderness. There is no rebound and no guarding.    Feels diffusely tight. Area of pain noted  Musculoskeletal: Normal range of motion. He exhibits no edema or tenderness.  Moves all extremities well.   Neurological: He is alert and oriented to person, place, and time. He has normal strength. No cranial nerve deficit.  Skin: Skin is warm, dry and intact. No rash noted. No erythema. No pallor.  Psychiatric: His speech is normal. His mood appears anxious.  Nursing note and vitals reviewed.   ED Course  Procedures (including critical care time)  Medications  ketorolac (TORADOL) 30 MG/ML injection 30 mg (30 mg Intravenous Given 04/23/15 2355)  cyclobenzaprine (FLEXERIL) tablet 10 mg (10 mg Oral Given 04/23/15 2351)  potassium chloride SA (K-DUR,KLOR-CON) CR tablet  40 mEq (40 mEq Oral Given 04/23/15 2351)    DIAGNOSTIC STUDIES: Oxygen Saturation is 96% on ra, nl by my interpretation.    COORDINATION OF CARE: 11:42 PM: Discussed treatment plan which includes meds, labs, imaging with pt at bedside; patient verbalizes understanding and agrees with treatment plan.  Patient was given IV Toradol and oral Flexeril for his what appears to be musculoskeletal chest and abdominal pain. After reviewing his x-ray was going to discuss with him about repeating his CT scans although he's had several in the last 6 weeks.  After reviewing his labs he was given oral potassium.  D-dimer was not done because the patient's history of cancer it is more likely going to be positive. Patient has had 3 abdominal/pelvic CT scans since March.  01:25 went to discuss his test results. Pt states he wants to leave, concerned about his mother being up so late. States meds given haven't helped, states however if he lays still his pain isn't bad.  Advised he hadn't finished his evaluation. Pt signed out  AMA  Review of the [place] shows patient was prescribed #20 hydrocodone on March 29.  Labs Review Results for orders placed or performed during the hospital encounter of 04/23/15  CBC with Differential  Result Value Ref Range   WBC 7.1 4.0 - 10.5 K/uL   RBC 4.02 (L) 4.22 - 5.81 MIL/uL   Hemoglobin 12.7 (L) 13.0 - 17.0 g/dL   HCT 37.6 (L) 39.0 - 52.0 %   MCV 93.5 78.0 - 100.0 fL   MCH 31.6 26.0 - 34.0 pg   MCHC 33.8 30.0 - 36.0 g/dL   RDW 14.1 11.5 - 15.5 %   Platelets 207 150 - 400 K/uL   Neutrophils Relative % 55 %   Neutro Abs 3.9 1.7 - 7.7 K/uL   Lymphocytes Relative 34 %   Lymphs Abs 2.4 0.7 - 4.0 K/uL   Monocytes Relative 8 %   Monocytes Absolute 0.6 0.1 - 1.0 K/uL   Eosinophils Relative 3 %   Eosinophils Absolute 0.2 0.0 - 0.7 K/uL   Basophils Relative 0 %   Basophils Absolute 0.0 0.0 - 0.1 K/uL  Comprehensive metabolic panel  Result Value Ref Range   Sodium 137 135 - 145 mmol/L   Potassium 3.0 (L) 3.5 - 5.1 mmol/L   Chloride 103 101 - 111 mmol/L   CO2 23 22 - 32 mmol/L   Glucose, Bld 134 (H) 65 - 99 mg/dL   BUN 9 6 - 20 mg/dL   Creatinine, Ser 0.90 0.61 - 1.24 mg/dL   Calcium 7.9 (L) 8.9 - 10.3 mg/dL   Total Protein 7.2 6.5 - 8.1 g/dL   Albumin 3.6 3.5 - 5.0 g/dL   AST 55 (H) 15 - 41 U/L   ALT 39 17 - 63 U/L   Alkaline Phosphatase 63 38 - 126 U/L   Total Bilirubin 0.4 0.3 - 1.2 mg/dL   GFR calc non Af Amer >60 >60 mL/min   GFR calc Af Amer >60 >60 mL/min   Anion gap 11 5 - 15  Troponin I  Result Value Ref Range   Troponin I <0.03 <0.031 ng/mL   Laboratory interpretation all normal except For hypokalemia, mild anemia     Imaging Review Dg Chest 2 View  04/22/2015  CLINICAL DATA:  Shortness of breath with exertion. EXAM: CHEST  2 VIEW COMPARISON:  Radiograph of March 07, 2015. PET scan of March 16, 2015. FINDINGS: The heart size and  mediastinal contours are within normal limits. Both lungs are clear. Left lower lobe mass noted on prior  PET scan is not well visualized on this study. No pneumothorax or pleural effusion is noted. The visualized skeletal structures are unremarkable. IMPRESSION: No definite radiographic abnormality seen at this time. Left lower lobe mass noted on prior PET scan is not well visualized on this study. Electronically Signed   By: Marijo Conception, M.D.   On: 04/22/2015 07:52   Dg C-arm Bronchoscopy  04/22/2015  CLINICAL DATA:  C-ARM BRONCHOSCOPY Fluoroscopy was utilized by the requesting physician.  No radiographic interpretation.   I have personally reviewed and evaluated these images and lab results as part of my medical decision-making.   EKG Interpretation   Date/Time:  Friday April 23 2015 23:49:05 EDT Ventricular Rate:  82 PR Interval:  167 QRS Duration: 90 QT Interval:  373 QTC Calculation: 436 R Axis:   -46 Text Interpretation:  Sinus rhythm Left axis deviation Borderline low  voltage, extremity leads No significant change since last tracing 11 Nov 2014 Confirmed by [facility]  MD-I, Jeni Duling ([id number]) on 04/23/2015 11:52:15 PM      MDM   Final diagnoses:  Atypical chest pain  Generalized abdominal pain   Pt left AMA  Rolland Porter, MD, FACEP   I personally performed the services described in this documentation, which was scribed in my presence. The recorded information has been reviewed and considered.  Rolland Porter, MD, Barbette Or, MD 04/24/15 [phone]

## 2015-04-23 NOTE — ED Notes (Signed)
Pt c/o sharp, severe abdominal pain and pressure since 7:00pm. Pt reports having bronchoscopy yesterday and had an xray of his chest. To follow up on a left lung mass.  Pt also reports he is being followed for an anal fissure. Pt hasn't had a BM in 2 days.

## 2015-04-24 LAB — TROPONIN I: Troponin I: <0.03 ng/mL (ref <0.031)

## 2015-04-24 NOTE — Op Note (Signed)
NAMEORESTES, POCHOP                 ACCOUNT NO.:  [id number]  MEDICAL RECORD NO.:  [id number]  LOCATION:  [facility]                         FACILITY:  [facility] [facility]  PHYSICIAN:  Revonda Standard. Roxan Hockey, M.D.DATE OF BIRTH:  03/23/1960  DATE OF PROCEDURE:  04/22/2015 DATE OF DISCHARGE:  04/22/2015                              OPERATIVE REPORT   PREOPERATIVE DIAGNOSIS:  Left lower lobe nodule with hilar adenopathy.  POSTOPERATIVE DIAGNOSIS:  Left lower lobe nodule with hilar adenopathy.  PROCEDURE:   Bronchoscopy with brushings, biopsies, needle aspirations,  and bronchoalveolar lavage  Endobronchial ultrasound.  SURGEON:  Revonda Standard. Roxan Hockey, M.D.  ASSISTANT:  None.  ANESTHESIA:  General.  FINDINGS:  No tumor seen on initial aspirations from 11L lymph node or left lower lobe brushings.  CLINICAL NOTE:  Mr. Liceaga is a 55 year old man with a history of tobacco abuse who recently was found to have an abnormality on a preoperative chest x-ray which showed an opacity in the left lower lobe.  A CT of the chest showed nodular consolidation in the left lower lobe.  On PET-CT, the nodular consolidation was hypermetabolic and there also was a hypermetabolic lymph node in the left hilum.  The patient was advised to undergo bronchoscopy and endobronchial ultrasound for diagnostic and possible staging purposes.  The indications, risks, benefits, and alternatives were discussed in detail with the patient.  He understood and accepted the risks and agreed to proceed.  DESCRIPTION OF PROCEDURE:  Mr. Aikins was brought to the operating room on April 22, 2015.  He had induction of general anesthesia.  After performing a time-out, flexible fiberoptic bronchoscopy was performed. There was normal endobronchial anatomy and no endobronchial lesions seen to the level of the subsegmental bronchi.  The bronchoscope was removed.  The endobronchial ultrasound probe then was advanced.  There were no enlarged or  pathologic appearing mediastinal nodes. Advancing into the left mainstem and down to the bifurcation, an 11L node appeared to be enlarged.  Multiple aspirations were obtained from this lymph node, slides were prepared, and separate specimens were also placed into the cytologic preparation fluid.  The endobronchial ultrasound probe was removed.  Next, the bronchoscope was reinserted and directed to the basilar segmental bronchi of the left lower lobe.  Multiple brushings were obtained.  There was some bleeding with the brushings.  Dilute epinephrine was instilled to assist with visualization.  The slides and brushings were sent to pathology but the specimens returned showing no tumor seen.  Multiple biopsies then were taken and sent for AFB and fungal cultures in addition to pathology.  Needle aspirations were performed.  All sampling was done with fluoroscopy visualization. Final inspection was made for hemostasis.  The bronchoscope was removed. The patient was extubated in the operating room and taken to the postanesthetic care unit in good condition.     Revonda Standard Roxan Hockey, M.D.     SCH/MEDQ  D:  04/23/2015  T:  04/24/2015  Job:  [id number]

## 2015-04-24 NOTE — ED Notes (Signed)
States mother has to go to work in the morning and that he did not want to keep her up any longer.  MD notified that patient was leaving AMA.

## 2015-04-26 LAB — TISSUE CULTURE: CULTURE: NO GROWTH

## 2015-04-27 ENCOUNTER — Encounter (HOSPITAL_COMMUNITY): Payer: Self-pay | Admitting: Thoracic Surgery (Cardiothoracic Vascular Surgery)

## 2015-04-28 ENCOUNTER — Ambulatory Visit (INDEPENDENT_AMBULATORY_CARE_PROVIDER_SITE_OTHER): Payer: Self-pay | Admitting: Thoracic Surgery (Cardiothoracic Vascular Surgery)

## 2015-04-28 ENCOUNTER — Encounter: Payer: Self-pay | Admitting: Thoracic Surgery (Cardiothoracic Vascular Surgery)

## 2015-04-28 ENCOUNTER — Other Ambulatory Visit: Payer: Self-pay | Admitting: *Deleted

## 2015-04-28 VITALS — BP 150/100 | HR 94 | Resp 20 | Ht 74.0 in | Wt 249.0 lb

## 2015-04-28 DIAGNOSIS — R599 Enlarged lymph nodes, unspecified: Secondary | ICD-10-CM

## 2015-04-28 DIAGNOSIS — R911 Solitary pulmonary nodule: Secondary | ICD-10-CM

## 2015-04-28 DIAGNOSIS — R918 Other nonspecific abnormal finding of lung field: Secondary | ICD-10-CM

## 2015-04-28 DIAGNOSIS — Z9889 Other specified postprocedural states: Secondary | ICD-10-CM

## 2015-04-28 DIAGNOSIS — R590 Localized enlarged lymph nodes: Secondary | ICD-10-CM

## 2015-04-28 DIAGNOSIS — J984 Other disorders of lung: Secondary | ICD-10-CM

## 2015-04-28 NOTE — Progress Notes (Signed)
[address]       [place] [address]             [phone]       HPI: Fernando [name] returns today to discuss the results of his bronchoscopy.  He is a 55 yo man with a history of tobacco abuse (1.5 ppd x 15 years, quit in February 2017.). He also has a history of hypertension, some degree of CAD managed medically, cervical fusion, and multiple perirectal abscesses/ scrotal abscess. He recently was having perirectal pain and constipation. He was seen at [facility]. As preparation for possible surgery he had a CXR which showed an opacity in the left lower lobe. The perirectal inflammation was managed nonoperatively. He was treated with antibiotics.  He saw Dr. Luan Pulling and a CT showed nodular consolidation in the left lower lobe. A PET CT showed the area was hypermetabolic and there was a hypermetabolic lymph node in the left hilum.  He has had a cough, mostly nonproductive but he has seen blood on a couple of occassions. He denies headaches or visual changes. He does complain of chest tightness and shortness of breath with exertion that has worsened over the past 2-3 weeks, which he attributes to quitting smoking. He has lost 10 pounds in the past 3 months. He has been off antibiotics for over a week and the pain and inability to defecate have worsened over the past couple of days. He denies HA or visual changes. 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  I did bronchoscopy and endobronchial ultrasound on 04/22/2015. No malignancy was seen. There was some nonspecific inflammation (no granulomas seen). Afterwards he says the following day he had a lot of abdominal pain due to coughing. He did have some hemoptysis which resolved within a few days.  Past Medical History  Diagnosis Date  . Hypertension 12/30/2011  . Allergy   . Peri-rectal abscess     multiple.   . Tobacco abuse   . Shortness of breath dyspnea     with exertion  .  Pneumonia   . Umbilical hernia     has been repaired  . Loose stools    Past Surgical History  Procedure Laterality Date  . Cervical fusion    . Facial cosmetic surgery    . Knee arthroscopy    . Appendectomy    . Hernia repair    . Spine surgery      cervical  . Cystoscopy N/A 05/01/2013    Procedure: CYSTOSCOPY;  Surgeon: Marissa Nestle, MD;  Location: [facility];  Service: Urology;  Laterality: N/A;  . Incision and drainage abscess Right 05/01/2013    Procedure: INCISION AND DRAINAGE RIGHT SCROTAL ABSCESS;  Surgeon: Marissa Nestle, MD;  Location: [facility];  Service: Urology;  Laterality: Right;  . Video bronchoscopy with endobronchial ultrasound N/A 04/22/2015    Procedure: VIDEO BRONCHOSCOPY WITH ENDOBRONCHIAL ULTRASOUND;  Surgeon: Melrose Nakayama, MD;  Location: [facility];  Service: Thoracic;  Laterality: N/A;     Current Outpatient Prescriptions  Medication Sig Dispense Refill  . albuterol (PROVENTIL HFA;VENTOLIN HFA) 108 (90 Base) MCG/ACT inhaler Inhale 2 puffs into the lungs every 6 (six) hours as needed for wheezing or shortness of breath. 1 Inhaler 12  . ALPRAZolam (XANAX) 1 MG tablet Take 1 tablet (1 mg total) by mouth 3 (three) times daily as needed for anxiety. 30 tablet 0  .  budesonide-formoterol (SYMBICORT) 80-4.5 MCG/ACT inhaler Inhale 2 puffs into the lungs 2 (two) times daily. 1 Inhaler 12  . lisinopril-hydrochlorothiazide (PRINZIDE,ZESTORETIC) 20-12.5 MG tablet Take 1 tablet by mouth daily. 30 tablet 3  . promethazine (PHENERGAN) 25 MG tablet Take 1 tablet (25 mg total) by mouth every 6 (six) hours as needed for nausea or vomiting. 30 tablet 0  . saccharomyces boulardii (FLORASTOR) 250 MG capsule Take 1 capsule (250 mg total) by mouth 2 (two) times daily. 60 capsule 0   No current facility-administered medications for this visit.    Physical Exam BP 150/100 mmHg  Pulse 94  Resp 20  Ht 6\' 2"  (1.88 m)  Wt 249 lb (112.946 kg)  BMI 31.96 kg/m2  SpO2 79% Obese  55 year old man in no acute distress Alert and oriented 3 with no focal deficits No cervical or subclavicular adenopathy Cardiac regular rate and rhythm normal S1 and S2 Lungs clear with equal breath sound bilaterally, no rales or wheezing No peripheral edema  Diagnostic Tests: I personally reviewed the CT chest, PET/CT, and echocardiogram. I also reviewed the PFT, and MR brain results.  He has a left lower lobe spiculated mass that is hypermetabolic on PET. There also was a suggestion of a hilar lymph node. There is no evidence of metastatic disease. Pathology showed nonspecific inflammation.  Impression: Fernando [name] is a 55 year old man with a history of tobacco abuse who has a spiculated left lower lobe lung nodule that is hypermetabolic by PET CT. Bronchoscopy and endobronchial ultrasound showed only some inflammation. I discussed these results with Fernando [name] and his mother. They understand that this does not rule out the possibility of cancer. They understand that this could be strictly an inflammatory nodule, but the downside is that if we were to observe this radiographically and it is cancer then the cancer could grow or spread in the interim.  I did offer him the options of very graphic observation versus surgical resection. This is not amenable to a wedge resection so he would need a lobectomy from the start. We discussed the pros and cons of each approach. He feels very strongly that he wants a definitive diagnostic and therapeutic procedures done.  I discussed with him left VATS, left lower lobectomy, and On-Q local anesthetic catheter placement. We discussed the indications risks benefits and alternatives. I informed him of the general nature of the procedure, the incisions to be used, the need for drainage tubes postoperatively, the expected hospital stay and overall recovery. He understands that no guarantee of cure can be given. He understands the risks include, but are not limited to  death, MI, DVT, PE, bleeding, possible need for transfusion, infection, prolonged air leak, cardiac arrhythmias, renal or respiratory failure, as well as the possibility of other unforeseeable complications.  He accepts the risk and wishes to proceed.  He has already seen Dorris Carnes and his echocardiogram showed normal left ventricular function. The echo did raise a concern for possible ascending aortic aneurysm. On CT his aorta measures 4.0 cm.  Plan: Left VATS, left lower lobectomy, On-Q local anesthetic catheter placement on Monday, 05/03/2015.  Melrose Nakayama, MD [facility] [phone]

## 2015-04-30 ENCOUNTER — Encounter (HOSPITAL_COMMUNITY): Payer: Self-pay

## 2015-04-30 ENCOUNTER — Encounter (HOSPITAL_COMMUNITY)
Admission: RE | Admit: 2015-04-30 | Discharge: 2015-04-30 | Disposition: A | Discharge status: Home / Self Care | Payer: Self-pay | Source: Ambulatory Visit | Attending: Thoracic Surgery (Cardiothoracic Vascular Surgery) | Admitting: Thoracic Surgery (Cardiothoracic Vascular Surgery)

## 2015-04-30 VITALS — BP 144/96 | HR 90 | Temp 98.0°F | Resp 20 | Ht 73.0 in | Wt 249.5 lb

## 2015-04-30 DIAGNOSIS — Z01818 Encounter for other preprocedural examination: Secondary | ICD-10-CM | POA: Insufficient documentation

## 2015-04-30 DIAGNOSIS — Z01812 Encounter for preprocedural laboratory examination: Secondary | ICD-10-CM | POA: Insufficient documentation

## 2015-04-30 DIAGNOSIS — R911 Solitary pulmonary nodule: Secondary | ICD-10-CM | POA: Insufficient documentation

## 2015-04-30 DIAGNOSIS — Z0183 Encounter for blood typing: Secondary | ICD-10-CM | POA: Insufficient documentation

## 2015-04-30 HISTORY — DX: Anal fissure, unspecified: K60.2

## 2015-04-30 HISTORY — DX: Malignant (primary) neoplasm, unspecified: C80.1

## 2015-04-30 HISTORY — DX: Adverse effect of unspecified anesthetic, initial encounter: T41.45XA

## 2015-04-30 HISTORY — DX: Other complications of anesthesia, initial encounter: T88.59XA

## 2015-04-30 LAB — URINALYSIS, ROUTINE W REFLEX MICROSCOPIC
Bilirubin Urine: NEGATIVE
Glucose, UA: NEGATIVE mg/dL
Hgb urine dipstick: NEGATIVE
Ketones, ur: NEGATIVE mg/dL
LEUKOCYTES UA: NEGATIVE
NITRITE: NEGATIVE
PH: 6 (ref 5.0–8.0)
Protein, ur: NEGATIVE mg/dL
Specific Gravity, Urine: 1.016 (ref 1.005–1.030)

## 2015-04-30 LAB — COMPREHENSIVE METABOLIC PANEL
ALK PHOS: 77 U/L (ref 38–126)
ALT: 29 U/L (ref 17–63)
AST: 26 U/L (ref 15–41)
Albumin: 3.4 g/dL — ABNORMAL LOW (ref 3.5–5.0)
Anion gap: 11 (ref 5–15)
BILIRUBIN TOTAL: 1.2 mg/dL (ref 0.3–1.2)
BUN: 8 mg/dL (ref 6–20)
CALCIUM: 8.8 mg/dL — AB (ref 8.9–10.3)
CHLORIDE: 103 mmol/L (ref 101–111)
CO2: 22 mmol/L (ref 22–32)
CREATININE: 1.01 mg/dL (ref 0.61–1.24)
GFR calc Af Amer: >60 mL/min (ref >60)
Glucose, Bld: 104 mg/dL — ABNORMAL HIGH (ref 65–99)
Potassium: 3.7 mmol/L (ref 3.5–5.1)
Sodium: 136 mmol/L (ref 135–145)
TOTAL PROTEIN: 7.2 g/dL (ref 6.5–8.1)

## 2015-04-30 LAB — CBC
HEMATOCRIT: 41.4 % (ref 39.0–52.0)
HEMOGLOBIN: 14.6 g/dL (ref 13.0–17.0)
MCH: 32.7 pg (ref 26.0–34.0)
MCHC: 35.3 g/dL (ref 30.0–36.0)
MCV: 92.6 fL (ref 78.0–100.0)
Platelets: 198 10*3/uL (ref 150–400)
RBC: 4.47 MIL/uL (ref 4.22–5.81)
RDW: 14.3 % (ref 11.5–15.5)
WBC: 5.9 10*3/uL (ref 4.0–10.5)

## 2015-04-30 LAB — BLOOD GAS, ARTERIAL
Acid-Base Excess: 2.5 mmol/L — ABNORMAL HIGH (ref 0.0–2.0)
Bicarbonate: 26.1 mEq/L — ABNORMAL HIGH (ref 20.0–24.0)
DRAWN BY: 42180
FIO2: 0.21
O2 Saturation: 95.2 %
PH ART: 7.463 — AB (ref 7.350–7.450)
PO2 ART: 75.8 mmHg — AB (ref 80.0–100.0)
Patient temperature: 98.6
TCO2: 27.2 mmol/L (ref 0–100)
pCO2 arterial: 36.8 mmHg (ref 35.0–45.0)

## 2015-04-30 LAB — SURGICAL PCR SCREEN
MRSA, PCR: NEGATIVE
STAPHYLOCOCCUS AUREUS: POSITIVE — AB

## 2015-04-30 LAB — TYPE AND SCREEN
ABO/RH(D): A POS
Antibody Screen: NEGATIVE

## 2015-04-30 LAB — ABO/RH: ABO/RH(D): A POS

## 2015-04-30 LAB — PROTIME-INR
INR: 0.98 (ref 0.00–1.49)
PROTHROMBIN TIME: 13.2 s (ref 11.6–15.2)

## 2015-04-30 LAB — APTT: aPTT: 25 seconds (ref 24–37)

## 2015-04-30 NOTE — Pre-Procedure Instructions (Signed)
Fernando [name]  04/30/2015      [facility] - [place], [place] - [id number] [place] [address] Phone: [phone] Fax: [phone]  [facility] [address], [place] [address] Phone: [phone] Fax: [phone]    Your procedure is scheduled on Monday, May 03, 2015  Report to [facility] Admitting at 5:30 A.M.  Call this number if you have problems the morning of surgery:  [phone]   Remember:  Do not eat food or drink liquids after midnight Sunday, May 02, 2015  Take these medicines the morning of surgery with A SIP OF WATER : budesonide-formoterol (SYMBICORT) 80-4.5 MCG/ACT inhaler,  if needed:ALPRAZolam Duanne Moron) for anxiety,  promethazine (PHENERGAN) for nausea or vomiting, albuterol (PROVENTIL HFA;VENTOLIN HFA)  Inhaler for wheezing or shortness of breath ( bring inhaler in with you on day of surgery). Stop taking Aspirin, vitamins, fish oil, and herbal medications. Do not take any NSAIDs ie: Ibuprofen, Advil, Naproxen, BC and Goody Powder or any medication containing Aspirin; stop now.  Do not wear jewelry, make-up or nail polish.  Do not wear lotions, powders, or perfumes.  You may not wear deodorant.  Do not shave 48 hours prior to surgery.  Men may shave face and neck.  Do not bring valuables to the hospital.  [facility] is not responsible for any belongings or valuables.  Contacts, dentures or bridgework may not be worn into surgery.  Leave your suitcase in the car.  After surgery it may be brought to your room.  For patients admitted to the hospital, discharge time will be determined by your treatment team.  Patients discharged the day of surgery will not be allowed to drive home.   Name and phone number of your driver:   Special instructions:  [place] - Preparing for Surgery  Before surgery, you can play an important role.  Because skin is not sterile, your  skin needs to be as free of germs as possible.  You can reduce the number of germs on you skin by washing with CHG (chlorahexidine gluconate) soap before surgery.  CHG is an antiseptic cleaner which kills germs and bonds with the skin to continue killing germs even after washing.  Please DO NOT use if you have an allergy to CHG or antibacterial soaps.  If your skin becomes reddened/irritated stop using the CHG and inform your nurse when you arrive at Short Stay.  Do not shave (including legs and underarms) for at least 48 hours prior to the first CHG shower.  You may shave your face.  Please follow these instructions carefully:   1.  Shower with CHG Soap the night before surgery and the morning of Surgery.  2.  If you choose to wash your hair, wash your hair first as usual with your normal shampoo.  3.  After you shampoo, rinse your hair and body thoroughly to remove the Shampoo.  4.  Use CHG as you would any other liquid soap.  You can apply chg directly  to the skin and wash gently with scrungie or a clean washcloth.  5.  Apply the CHG Soap to your body ONLY FROM THE NECK DOWN.  Do not use on open wounds or open sores.  Avoid contact with your eyes, ears, mouth and genitals (private parts).  Wash genitals (private parts) with your normal soap.  6.  Wash thoroughly, paying  special attention to the area where your surgery will be performed.  7.  Thoroughly rinse your body with warm water from the neck down.  8.  DO NOT shower/wash with your normal soap after using and rinsing off the CHG Soap.  9.  Pat yourself dry with a clean towel.            10.  Wear clean pajamas.            11.  Place clean sheets on your bed the night of your first shower and do not sleep with pets.  Day of Surgery  Do not apply any lotions/deodorants the morning of surgery.  Please wear clean clothes to the hospital/surgery center.  Please read over the following fact sheets that you were given. Pain Booklet,  Coughing and Deep Breathing, Blood Transfusion Information, MRSA Information and Surgical Site Infection Prevention

## 2015-04-30 NOTE — Progress Notes (Signed)
   04/30/15 0815  OBSTRUCTIVE SLEEP APNEA  Have you ever been diagnosed with sleep apnea through a sleep study? No  Do you snore loudly (loud enough to be heard through closed doors)?  1  Do you often feel tired, fatigued, or sleepy during the daytime (such as falling asleep during driving or talking to someone)? 0  Has anyone observed you stop breathing during your sleep? 0  Do you have, or are you being treated for high blood pressure? 1  BMI more than 35 kg/m2? 0  Age > 50 (1-yes) 1  Neck circumference greater than:Male 16 inches or larger, Male 17inches or larger? 1  Male Gender (Yes=1) 1  Obstructive Sleep Apnea Score 5

## 2015-04-30 NOTE — Progress Notes (Signed)
Pt denies chest pain but has a history of SOB with exertion. Pt is under the care of Dr. Dorris Carnes, Cardiology. Pt denies having a stress test. Spoke with Ebony Hail, [place] , Anesthesia, regarding pt echo and CT showing an ascending aortic aneurysm.

## 2015-04-30 NOTE — Progress Notes (Signed)
Anesthesia Chart Review: Patient is a 55 year old male scheduled for left VATS, LL lobectomy, On-Q catheter placement on 05/03/15 by Dr. Roxan Hockey. Patient with finding of LLL opacity on abdominal 02/2015 CT for evaluation of rectal pain.  pre-operative CXR. LLL nodule confirmed by chest CT and was hypermetabolic on PET scan. Bronchoscopy on 04/22/15 showed inflammation without malignancy. Observation versus definitive diagnostic and therapeutic approaches reviewed, and patient opted for the above procedure.   History includes former smoker (quit 03/12/15), HTN, exertional dyspnea, recurrent perirectal abscess, cervical fusion, facial cosmetic surgery (not specified). The ascending thoracic aortic measured at 46 mm on 04/13/15 echo (no mention of TAA on 03/10/15 chest CT ("central thoracic aorta..unremarkable"; Dr. Roxan Hockey comments that aorta measured 4.0 cm on CT.). BMI is consistent with obesity.   PCP is listed as Collene Mares, PA-C. Pulmonologist is Dr. Sinda Du. Cardiologist is Dr. Dorris Carnes. Seen for pre-op lung surgery on 04/12/15. 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 She thought his EF may have been difficult to calculate then since he was in bigeminy. Echo repeated showing EF 65-70%. She felt he was "OK to proceed."  Meds include albuterol, Xanax, Symbicort, lisinopril-HCTZ, promethazine.  04/23/15 EKG: SR, LAD, borderline low voltage, extremity leads. No significant change since 11/11/14.  04/13/15 Echo: Study Conclusions - Left ventricle: The cavity size was normal. There was mild  concentric hypertrophy. Systolic function was vigorous. The  estimated ejection fraction was in the range of 65% to 70%. Wall  motion was normal; there were no regional wall motion  abnormalities. Doppler parameters are consistent with abnormal  left ventricular  relaxation (grade 1 diastolic dysfunction). - Aorta: Ascending aortic diameter: 46 mm (S). - Ascending aorta: The ascending aorta was moderately dilated. - Mitral valve: Calcified annulus. - Left atrium: The atrium was moderately dilated.  08/17/09 Cardiac cath (Dr. Chase Picket, [facility]): Conclusion: 1. Diminished LV systolic functio with ventricular ectopy during the ventriculogram but an EF of 40-45% with the apex being severely hyopkinetic. 2. Elevated end-diastolic pressure of 23. 3. Slow flow down the LAD diagonal system and slightly better but still slow flow down the diagonal. There was no high grade obstructions. 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 I will start him on IV nitroglycerin and Norvasc.   08/17/09 Nuclear stress test: IMPRESSION: 1. Inferior wall scar. 2. No findings for myocardial ischemia. 3. Estimated ejection fraction is 36%.  03/10/15 CT chest/abd/pelvis with contrast: IMPRESSION: 1. There is somewhat lobulated clustered nodular consolidation in left lower lobe centrally infrahilar region. This measures at least 3.3 x 2.8 cm. This is highly suspicious for malignancy.  2. Mild emphysematous changes bilateral upper lobe. 3. No mediastinal or hilar adenopathy. 4. Again noted perirectal and perianal inflammatory changes with a perianal abscess measures 3.2 x 1.3 x 1.4 cm without significant change from prior exam. No air-fluid level is noted within abscess. 5. No hydronephrosis or hydroureter. 6. Status post appendectomy. No pericecal inflammation. Unremarkable terminal ileum. 7. No small bowel obstruction. 8. Borderline bilateral inguinal lymph nodes probable reactive. 9. Stable degenerative changes lumbar spine.  04/30/15 CXR: IMPRESSION: No active cardiopulmonary disease.  04/05/15 PFTs: FVC 4.29 (75%), FEV1 3.20 (73%), DLCOunc 24.43 (64%).  Preoperative labs noted. Cr 1.01. CBC WNL.   Recent cardiology  evaluation . If no acute changes then I would anticipate that he could  proceed as planned.  [facility] Short Stay Center/Anesthesiology Phone [phone] 04/30/2015 4:19 PM

## 2015-05-03 ENCOUNTER — Inpatient Hospital Stay (HOSPITAL_COMMUNITY): Payer: Self-pay | Admitting: Certified Registered Nurse Anesthetist

## 2015-05-03 ENCOUNTER — Inpatient Hospital Stay (HOSPITAL_COMMUNITY): Payer: Self-pay

## 2015-05-03 ENCOUNTER — Encounter (HOSPITAL_COMMUNITY)
Admission: RE | Disposition: A | Discharge status: Home / Self Care | Payer: Self-pay | Source: Ambulatory Visit | Attending: Thoracic Surgery (Cardiothoracic Vascular Surgery)

## 2015-05-03 ENCOUNTER — Inpatient Hospital Stay (HOSPITAL_COMMUNITY)
Admission: RE | Admit: 2015-05-03 | Discharge: 2015-05-07 | DRG: 164 | Disposition: A | Discharge status: Home / Self Care | Payer: Self-pay | Source: Ambulatory Visit | Attending: Thoracic Surgery (Cardiothoracic Vascular Surgery) | Admitting: Thoracic Surgery (Cardiothoracic Vascular Surgery)

## 2015-05-03 ENCOUNTER — Inpatient Hospital Stay (HOSPITAL_COMMUNITY): Payer: Self-pay | Admitting: Vascular Surgery

## 2015-05-03 ENCOUNTER — Encounter (HOSPITAL_COMMUNITY): Payer: Self-pay | Admitting: *Deleted

## 2015-05-03 DIAGNOSIS — R599 Enlarged lymph nodes, unspecified: Secondary | ICD-10-CM

## 2015-05-03 DIAGNOSIS — K5900 Constipation, unspecified: Secondary | ICD-10-CM | POA: Diagnosis present

## 2015-05-03 DIAGNOSIS — Z87891 Personal history of nicotine dependence: Secondary | ICD-10-CM

## 2015-05-03 DIAGNOSIS — R911 Solitary pulmonary nodule: Principal | ICD-10-CM

## 2015-05-03 DIAGNOSIS — Z981 Arthrodesis status: Secondary | ICD-10-CM

## 2015-05-03 DIAGNOSIS — Z79899 Other long term (current) drug therapy: Secondary | ICD-10-CM

## 2015-05-03 DIAGNOSIS — E876 Hypokalemia: Secondary | ICD-10-CM | POA: Diagnosis present

## 2015-05-03 DIAGNOSIS — I2510 Atherosclerotic heart disease of native coronary artery without angina pectoris: Secondary | ICD-10-CM | POA: Diagnosis present

## 2015-05-03 DIAGNOSIS — Z4682 Encounter for fitting and adjustment of non-vascular catheter: Secondary | ICD-10-CM

## 2015-05-03 DIAGNOSIS — R042 Hemoptysis: Secondary | ICD-10-CM | POA: Diagnosis present

## 2015-05-03 DIAGNOSIS — D62 Acute posthemorrhagic anemia: Secondary | ICD-10-CM | POA: Diagnosis present

## 2015-05-03 DIAGNOSIS — J9811 Atelectasis: Secondary | ICD-10-CM

## 2015-05-03 DIAGNOSIS — I10 Essential (primary) hypertension: Secondary | ICD-10-CM | POA: Diagnosis present

## 2015-05-03 DIAGNOSIS — K611 Rectal abscess: Secondary | ICD-10-CM | POA: Diagnosis present

## 2015-05-03 DIAGNOSIS — J939 Pneumothorax, unspecified: Secondary | ICD-10-CM

## 2015-05-03 HISTORY — PX: VIDEO ASSISTED THORACOSCOPY (VATS)/ LOBECTOMY: SHX6169

## 2015-05-03 LAB — GLUCOSE, CAPILLARY
Glucose-Capillary: 116 mg/dL — ABNORMAL HIGH (ref 65–99)
Glucose-Capillary: 137 mg/dL — ABNORMAL HIGH (ref 65–99)

## 2015-05-03 SURGERY — VIDEO ASSISTED THORACOSCOPY (VATS)/ LOBECTOMY
Anesthesia: General | Site: Chest | Laterality: Left

## 2015-05-03 MED ORDER — ACETAMINOPHEN 500 MG PO TABS
1000.0000 mg | ORAL_TABLET | Freq: Four times a day (QID) | ORAL | Status: DC
  Administered 2015-05-03 – 2015-05-07 (×16): 1000 mg via ORAL
  Filled 2015-05-03 (×17): qty 2

## 2015-05-03 MED ORDER — NALOXONE HCL 0.4 MG/ML IJ SOLN: 0.4000 mg | INTRAMUSCULAR | Status: DC | PRN

## 2015-05-03 MED ORDER — TRAMADOL HCL 50 MG PO TABS: 50.0000 mg | ORAL_TABLET | Freq: Four times a day (QID) | ORAL | Status: DC | PRN

## 2015-05-03 MED ORDER — MIDAZOLAM HCL 2 MG/2ML IJ SOLN
INTRAMUSCULAR | Status: AC
  Filled 2015-05-03: qty 2

## 2015-05-03 MED ORDER — FENTANYL CITRATE (PF) 250 MCG/5ML IJ SOLN
INTRAMUSCULAR | Status: AC
  Filled 2015-05-03: qty 5

## 2015-05-03 MED ORDER — MIDAZOLAM HCL 2 MG/2ML IJ SOLN
INTRAMUSCULAR | Status: AC
  Administered 2015-05-03 (×2): 2 mg via INTRAVENOUS
  Filled 2015-05-03: qty 2

## 2015-05-03 MED ORDER — EPHEDRINE SULFATE 50 MG/ML IJ SOLN
INTRAMUSCULAR | Status: AC
  Filled 2015-05-03: qty 1

## 2015-05-03 MED ORDER — BISACODYL 5 MG PO TBEC
10.0000 mg | DELAYED_RELEASE_TABLET | Freq: Every day | ORAL | Status: DC
  Administered 2015-05-03 – 2015-05-07 (×5): 10 mg via ORAL
  Filled 2015-05-03 (×6): qty 2

## 2015-05-03 MED ORDER — DEXTROSE 5 % IV SOLN
INTRAVENOUS | Status: AC
  Filled 2015-05-03: qty 1.5

## 2015-05-03 MED ORDER — ALPRAZOLAM 0.5 MG PO TABS
1.0000 mg | ORAL_TABLET | Freq: Three times a day (TID) | ORAL | Status: DC | PRN
  Administered 2015-05-03 – 2015-05-07 (×9): 1 mg via ORAL
  Filled 2015-05-03 (×9): qty 2

## 2015-05-03 MED ORDER — ONDANSETRON HCL 4 MG/2ML IJ SOLN: 4.0000 mg | Freq: Once | INTRAMUSCULAR | Status: DC | PRN

## 2015-05-03 MED ORDER — GLYCOPYRROLATE 0.2 MG/ML IJ SOLN
INTRAMUSCULAR | Status: AC
  Filled 2015-05-03: qty 3

## 2015-05-03 MED ORDER — ONDANSETRON HCL 4 MG/2ML IJ SOLN: 4.0000 mg | Freq: Four times a day (QID) | INTRAMUSCULAR | Status: DC | PRN

## 2015-05-03 MED ORDER — KCL IN DEXTROSE-NACL 20-5-0.9 MEQ/L-%-% IV SOLN
INTRAVENOUS | Status: DC
  Administered 2015-05-03 – 2015-05-04 (×2): via INTRAVENOUS
  Filled 2015-05-03 (×7): qty 1000

## 2015-05-03 MED ORDER — SUCCINYLCHOLINE CHLORIDE 20 MG/ML IJ SOLN
INTRAMUSCULAR | Status: AC
  Filled 2015-05-03: qty 1

## 2015-05-03 MED ORDER — MOMETASONE FURO-FORMOTEROL FUM 100-5 MCG/ACT IN AERO
2.0000 | INHALATION_SPRAY | Freq: Two times a day (BID) | RESPIRATORY_TRACT | Status: DC
  Administered 2015-05-04 – 2015-05-07 (×7): 2 via RESPIRATORY_TRACT
  Filled 2015-05-03: qty 8.8

## 2015-05-03 MED ORDER — FENTANYL CITRATE (PF) 100 MCG/2ML IJ SOLN
INTRAMUSCULAR | Status: AC
  Filled 2015-05-03: qty 2

## 2015-05-03 MED ORDER — ONDANSETRON HCL 4 MG/2ML IJ SOLN
INTRAMUSCULAR | Status: DC | PRN
  Administered 2015-05-03: 4 mg via INTRAVENOUS

## 2015-05-03 MED ORDER — ROCURONIUM BROMIDE 50 MG/5ML IV SOLN
INTRAVENOUS | Status: AC
  Filled 2015-05-03: qty 1

## 2015-05-03 MED ORDER — FENTANYL CITRATE (PF) 100 MCG/2ML IJ SOLN
INTRAMUSCULAR | Status: DC | PRN
  Administered 2015-05-03 (×9): 50 ug via INTRAVENOUS
  Administered 2015-05-03: 100 ug via INTRAVENOUS
  Administered 2015-05-03 (×2): 50 ug via INTRAVENOUS

## 2015-05-03 MED ORDER — DEXTROSE 5 % IV SOLN
1.5000 g | INTRAVENOUS | Status: AC
  Administered 2015-05-03: 1.5 g via INTRAVENOUS

## 2015-05-03 MED ORDER — INSULIN ASPART 100 UNIT/ML ~~LOC~~ SOLN
0.0000 [IU] | Freq: Four times a day (QID) | SUBCUTANEOUS | Status: DC
  Administered 2015-05-03: 2 [IU] via SUBCUTANEOUS

## 2015-05-03 MED ORDER — DIPHENHYDRAMINE HCL 12.5 MG/5ML PO ELIX: 12.5000 mg | ORAL_SOLUTION | Freq: Four times a day (QID) | ORAL | Status: DC | PRN

## 2015-05-03 MED ORDER — LIDOCAINE HCL (CARDIAC) 20 MG/ML IV SOLN
INTRAVENOUS | Status: AC
  Filled 2015-05-03: qty 5

## 2015-05-03 MED ORDER — PROPOFOL 10 MG/ML IV BOLUS
INTRAVENOUS | Status: AC
  Filled 2015-05-03: qty 20

## 2015-05-03 MED ORDER — LACTATED RINGERS IV SOLN
INTRAVENOUS | Status: DC | PRN
  Administered 2015-05-03 (×2): via INTRAVENOUS

## 2015-05-03 MED ORDER — SODIUM CHLORIDE 0.9% FLUSH: 9.0000 mL | INTRAVENOUS | Status: DC | PRN

## 2015-05-03 MED ORDER — STERILE WATER FOR INJECTION IJ SOLN
INTRAMUSCULAR | Status: AC
  Filled 2015-05-03: qty 10

## 2015-05-03 MED ORDER — BUPIVACAINE HCL (PF) 0.5 % IJ SOLN
INTRAMUSCULAR | Status: DC | PRN
  Administered 2015-05-03: 5 mL

## 2015-05-03 MED ORDER — BUPIVACAINE ON-Q PAIN PUMP (FOR ORDER SET NO CHG)
INJECTION | Status: DC
  Filled 2015-05-03: qty 1

## 2015-05-03 MED ORDER — FENTANYL 40 MCG/ML IV SOLN
INTRAVENOUS | Status: AC
  Administered 2015-05-03: 13:00:00 via INTRAVENOUS
  Administered 2015-05-03: 100 ug via INTRAVENOUS
  Administered 2015-05-04 (×2): 50 ug via INTRAVENOUS
  Administered 2015-05-04: 80 ug via INTRAVENOUS
  Administered 2015-05-04 (×2): 60 ug via INTRAVENOUS
  Administered 2015-05-04: 50 ug via INTRAVENOUS
  Administered 2015-05-05: 10 ug via INTRAVENOUS
  Administered 2015-05-05: 20 ug via INTRAVENOUS
  Administered 2015-05-05: 6 ug via INTRAVENOUS
  Administered 2015-05-05: 10 ug via INTRAVENOUS
  Administered 2015-05-05: 3 ug via INTRAVENOUS
  Administered 2015-05-06: 270 ug via INTRAVENOUS
  Administered 2015-05-06: 160 ug via INTRAVENOUS
  Filled 2015-05-03: qty 25

## 2015-05-03 MED ORDER — ROCURONIUM BROMIDE 50 MG/5ML IV SOLN
INTRAVENOUS | Status: AC
  Filled 2015-05-03: qty 2

## 2015-05-03 MED ORDER — SUGAMMADEX SODIUM 200 MG/2ML IV SOLN
INTRAVENOUS | Status: AC
  Filled 2015-05-03: qty 2

## 2015-05-03 MED ORDER — ALPRAZOLAM 1 MG PO TABS: 1.0000 mg | ORAL_TABLET | Freq: Three times a day (TID) | ORAL | Status: DC | PRN

## 2015-05-03 MED ORDER — BUPIVACAINE HCL (PF) 0.5 % IJ SOLN
INTRAMUSCULAR | Status: AC
  Filled 2015-05-03: qty 10

## 2015-05-03 MED ORDER — OXYCODONE HCL 5 MG PO TABS
5.0000 mg | ORAL_TABLET | ORAL | Status: DC | PRN
  Administered 2015-05-03 – 2015-05-07 (×14): 10 mg via ORAL
  Filled 2015-05-03 (×14): qty 2

## 2015-05-03 MED ORDER — SUGAMMADEX SODIUM 200 MG/2ML IV SOLN
INTRAVENOUS | Status: DC | PRN
  Administered 2015-05-03: 200 mg via INTRAVENOUS

## 2015-05-03 MED ORDER — SENNOSIDES-DOCUSATE SODIUM 8.6-50 MG PO TABS
1.0000 | ORAL_TABLET | Freq: Every day | ORAL | Status: DC
  Administered 2015-05-03 – 2015-05-06 (×4): 1 via ORAL
  Filled 2015-05-03 (×4): qty 1

## 2015-05-03 MED ORDER — ONDANSETRON HCL 4 MG/2ML IJ SOLN
INTRAMUSCULAR | Status: AC
  Filled 2015-05-03: qty 2

## 2015-05-03 MED ORDER — 0.9 % SODIUM CHLORIDE (POUR BTL) OPTIME
TOPICAL | Status: DC | PRN
  Administered 2015-05-03 (×2): 1000 mL

## 2015-05-03 MED ORDER — PHENYLEPHRINE HCL 10 MG/ML IJ SOLN
INTRAMUSCULAR | Status: DC | PRN
  Administered 2015-05-03: 80 ug via INTRAVENOUS

## 2015-05-03 MED ORDER — NEOSTIGMINE METHYLSULFATE 10 MG/10ML IV SOLN
INTRAVENOUS | Status: AC
  Filled 2015-05-03: qty 1

## 2015-05-03 MED ORDER — PHENYLEPHRINE 40 MCG/ML (10ML) SYRINGE FOR IV PUSH (FOR BLOOD PRESSURE SUPPORT)
PREFILLED_SYRINGE | INTRAVENOUS | Status: AC
  Filled 2015-05-03: qty 10

## 2015-05-03 MED ORDER — FENTANYL CITRATE (PF) 100 MCG/2ML IJ SOLN
25.0000 ug | INTRAMUSCULAR | Status: DC | PRN
  Administered 2015-05-03 (×3): 50 ug via INTRAVENOUS

## 2015-05-03 MED ORDER — ACETAMINOPHEN 160 MG/5ML PO SOLN: 1000.0000 mg | Freq: Four times a day (QID) | ORAL | Status: DC

## 2015-05-03 MED ORDER — PROPOFOL 10 MG/ML IV BOLUS
INTRAVENOUS | Status: DC | PRN
  Administered 2015-05-03: 200 mg via INTRAVENOUS

## 2015-05-03 MED ORDER — PANTOPRAZOLE SODIUM 40 MG PO TBEC
40.0000 mg | DELAYED_RELEASE_TABLET | Freq: Every day | ORAL | Status: DC
  Administered 2015-05-03 – 2015-05-07 (×5): 40 mg via ORAL
  Filled 2015-05-03 (×5): qty 1

## 2015-05-03 MED ORDER — POTASSIUM CHLORIDE 10 MEQ/50ML IV SOLN
10.0000 meq | Freq: Every day | INTRAVENOUS | Status: DC | PRN
  Administered 2015-05-04 – 2015-05-05 (×3): 10 meq via INTRAVENOUS
  Filled 2015-05-03 (×4): qty 50

## 2015-05-03 MED ORDER — BUPIVACAINE 0.5 % ON-Q PUMP SINGLE CATH 400 ML
400.0000 mL | INJECTION | Status: DC
  Administered 2015-05-03: 400 mL
  Filled 2015-05-03: qty 400

## 2015-05-03 MED ORDER — PHENYLEPHRINE HCL 10 MG/ML IJ SOLN
10.0000 mg | INTRAMUSCULAR | Status: DC | PRN
  Administered 2015-05-03: 25 ug/min via INTRAVENOUS

## 2015-05-03 MED ORDER — FENTANYL 40 MCG/ML IV SOLN
INTRAVENOUS | Status: AC
  Filled 2015-05-03: qty 25

## 2015-05-03 MED ORDER — LACTATED RINGERS IV SOLN
INTRAVENOUS | Status: DC | PRN
  Administered 2015-05-03: 07:00:00 via INTRAVENOUS

## 2015-05-03 MED ORDER — DIPHENHYDRAMINE HCL 50 MG/ML IJ SOLN: 12.5000 mg | Freq: Four times a day (QID) | INTRAMUSCULAR | Status: DC | PRN

## 2015-05-03 MED ORDER — ROCURONIUM BROMIDE 100 MG/10ML IV SOLN
INTRAVENOUS | Status: DC | PRN
  Administered 2015-05-03: 10 mg via INTRAVENOUS
  Administered 2015-05-03: 20 mg via INTRAVENOUS
  Administered 2015-05-03 (×4): 10 mg via INTRAVENOUS
  Administered 2015-05-03 (×2): 20 mg via INTRAVENOUS
  Administered 2015-05-03: 10 mg via INTRAVENOUS
  Administered 2015-05-03: 50 mg via INTRAVENOUS

## 2015-05-03 SURGICAL SUPPLY — 95 items
ADH SKN CLS APL DERMABOND .7 (GAUZE/BANDAGES/DRESSINGS) ×1
APPLIER CLIP 5 13 M/L LIGAMAX5 (MISCELLANEOUS) ×2
APPLIER CLIP ROT 10 11.4 M/L (STAPLE)
APR CLP MED LRG 11.4X10 (STAPLE)
APR CLP MED LRG 5 ANG JAW (MISCELLANEOUS) ×1
BAG SPEC RTRVL LRG 6X4 10 (ENDOMECHANICALS)
CANISTER SUCTION 2500CC (MISCELLANEOUS) ×2 IMPLANT
CATH KIT ON Q 5IN SLV (PAIN MANAGEMENT) IMPLANT
CATH THORACIC 28FR (CATHETERS) IMPLANT
CATH THORACIC 28FR RT ANG (CATHETERS) IMPLANT
CATH THORACIC 36FR (CATHETERS) IMPLANT
CATH THORACIC 36FR RT ANG (CATHETERS) IMPLANT
CLIP APPLIE 5 13 M/L LIGAMAX5 (MISCELLANEOUS) IMPLANT
CLIP APPLIE ROT 10 11.4 M/L (STAPLE) IMPLANT
CLIP TI MEDIUM 6 (CLIP) ×2 IMPLANT
CONN ST 1/4X3/8  BEN (MISCELLANEOUS) ×2
CONN ST 1/4X3/8 BEN (MISCELLANEOUS) IMPLANT
CONN Y 3/8X3/8X3/8  BEN (MISCELLANEOUS)
CONN Y 3/8X3/8X3/8 BEN (MISCELLANEOUS) IMPLANT
CONT SPEC 4OZ CLIKSEAL STRL BL (MISCELLANEOUS) ×14 IMPLANT
COVER SURGICAL LIGHT HANDLE (MISCELLANEOUS) ×1 IMPLANT
CUTTER ECHEON FLEX ENDO 45 340 (ENDOMECHANICALS) ×1 IMPLANT
DERMABOND ADVANCED (GAUZE/BANDAGES/DRESSINGS) ×1
DERMABOND ADVANCED .7 DNX12 (GAUZE/BANDAGES/DRESSINGS) IMPLANT
DRAPE LAPAROSCOPIC ABDOMINAL (DRAPES) ×2 IMPLANT
DRAPE WARM FLUID 44X44 (DRAPE) ×2 IMPLANT
ELECT BLADE 4.0 EZ CLEAN MEGAD (MISCELLANEOUS) ×2
ELECT BLADE 6.5 EXT (BLADE) ×3 IMPLANT
ELECT REM PT RETURN 9FT ADLT (ELECTROSURGICAL) ×2
ELECTRODE BLDE 4.0 EZ CLN MEGD (MISCELLANEOUS) IMPLANT
ELECTRODE REM PT RTRN 9FT ADLT (ELECTROSURGICAL) ×1 IMPLANT
FELT TEFLON 1X6 (MISCELLANEOUS) ×1 IMPLANT
GAUZE SPONGE 4X4 12PLY STRL (GAUZE/BANDAGES/DRESSINGS) ×2 IMPLANT
GLOVE BIOGEL PI IND STRL 7.0 (GLOVE) IMPLANT
GLOVE BIOGEL PI INDICATOR 7.0 (GLOVE) ×1
GLOVE SURG SIGNA 7.5 PF LTX (GLOVE) ×4 IMPLANT
GLOVE SURG SS PI 7.0 STRL IVOR (GLOVE) ×2 IMPLANT
GOWN STRL REUS W/ TWL LRG LVL3 (GOWN DISPOSABLE) ×2 IMPLANT
GOWN STRL REUS W/ TWL XL LVL3 (GOWN DISPOSABLE) ×1 IMPLANT
GOWN STRL REUS W/TWL LRG LVL3 (GOWN DISPOSABLE) ×2
GOWN STRL REUS W/TWL XL LVL3 (GOWN DISPOSABLE) ×6
HEMOSTAT SURGICEL 2X14 (HEMOSTASIS) IMPLANT
KIT BASIN OR (CUSTOM PROCEDURE TRAY) ×2 IMPLANT
KIT ROOM TURNOVER OR (KITS) ×2 IMPLANT
KIT SUCTION CATH 14FR (SUCTIONS) IMPLANT
NS IRRIG 1000ML POUR BTL (IV SOLUTION) ×5 IMPLANT
PACK CHEST (CUSTOM PROCEDURE TRAY) ×2 IMPLANT
PAD ARMBOARD 7.5X6 YLW CONV (MISCELLANEOUS) ×4 IMPLANT
POUCH ENDO CATCH II 15MM (MISCELLANEOUS) ×1 IMPLANT
POUCH SPECIMEN RETRIEVAL 10MM (ENDOMECHANICALS) IMPLANT
RELOAD GOLD ECHELON 45 (STAPLE) ×3 IMPLANT
RELOAD GREEN ECHELON 45 (STAPLE) ×1 IMPLANT
RELOAD STAPLE 35X2.5 WHT THIN (STAPLE) IMPLANT
SCISSORS ENDO CVD 5DCS (MISCELLANEOUS) IMPLANT
SEALANT PROGEL (MISCELLANEOUS) IMPLANT
SEALANT SURG COSEAL 4ML (VASCULAR PRODUCTS) IMPLANT
SEALANT SURG COSEAL 8ML (VASCULAR PRODUCTS) IMPLANT
SHEARS HARMONIC HDI 20CM (ELECTROSURGICAL) ×1 IMPLANT
SOLUTION ANTI FOG 6CC (MISCELLANEOUS) ×2 IMPLANT
SPECIMEN JAR MEDIUM (MISCELLANEOUS) IMPLANT
SPONGE GAUZE 4X4 12PLY STER LF (GAUZE/BANDAGES/DRESSINGS) ×1 IMPLANT
SPONGE INTESTINAL PEANUT (DISPOSABLE) ×4 IMPLANT
SPONGE TONSIL 1 RF SGL (DISPOSABLE) ×2 IMPLANT
STAPLE RELOAD 2.5MM WHITE (STAPLE) ×10 IMPLANT
STAPLER VASCULAR ECHELON 35 (CUTTER) ×1 IMPLANT
SUT PROLENE 4 0 RB 1 (SUTURE) ×2
SUT PROLENE 4-0 RB1 .5 CRCL 36 (SUTURE) IMPLANT
SUT SILK  1 MH (SUTURE) ×2
SUT SILK 1 MH (SUTURE) ×2 IMPLANT
SUT SILK 1 TIES 10X30 (SUTURE) ×2 IMPLANT
SUT SILK 2 0 SH (SUTURE) IMPLANT
SUT SILK 2 0SH CR/8 30 (SUTURE) IMPLANT
SUT SILK 3 0 SH 30 (SUTURE) IMPLANT
SUT SILK 3 0SH CR/8 30 (SUTURE) ×1 IMPLANT
SUT VIC AB 1 CTX 36 (SUTURE) ×2
SUT VIC AB 1 CTX36XBRD ANBCTR (SUTURE) ×1 IMPLANT
SUT VIC AB 2-0 CTX 36 (SUTURE) ×2 IMPLANT
SUT VIC AB 2-0 UR6 27 (SUTURE) IMPLANT
SUT VIC AB 3-0 MH 27 (SUTURE) IMPLANT
SUT VIC AB 3-0 X1 27 (SUTURE) ×2 IMPLANT
SUT VICRYL 2 TP 1 (SUTURE) IMPLANT
SWAB COLLECTION DEVICE MRSA (MISCELLANEOUS) IMPLANT
SYSTEM SAHARA CHEST DRAIN ATS (WOUND CARE) ×2 IMPLANT
TAPE CLOTH 4X10 WHT NS (GAUZE/BANDAGES/DRESSINGS) ×2 IMPLANT
TAPE CLOTH SURG 6X10 WHT LF (GAUZE/BANDAGES/DRESSINGS) ×1 IMPLANT
TIP APPLICATOR SPRAY EXTEND 16 (VASCULAR PRODUCTS) IMPLANT
TOWEL OR 17X24 6PK STRL BLUE (TOWEL DISPOSABLE) ×2 IMPLANT
TOWEL OR 17X26 10 PK STRL BLUE (TOWEL DISPOSABLE) ×2 IMPLANT
TRAP SPECIMEN MUCOUS 40CC (MISCELLANEOUS) IMPLANT
TRAY FOLEY CATH 16FRSI W/METER (SET/KITS/TRAYS/PACK) ×2 IMPLANT
TROCAR BLADELESS 5MM (ENDOMECHANICALS) ×1 IMPLANT
TROCAR XCEL BLADELESS 5X75MML (TROCAR) ×2 IMPLANT
TUBE ANAEROBIC SPECIMEN COL (MISCELLANEOUS) IMPLANT
TUNNELER SHEATH ON-Q 11GX8 DSP (PAIN MANAGEMENT) ×1 IMPLANT
WATER STERILE IRR 1000ML POUR (IV SOLUTION) ×2 IMPLANT

## 2015-05-03 NOTE — Anesthesia Preprocedure Evaluation (Addendum)
Anesthesia Evaluation  Patient identified by MRN, date of birth, ID band Patient awake    Reviewed: Allergy & Precautions, NPO status , Patient's Chart, lab work & pertinent test results  Airway Mallampati: II       Dental  (+) Teeth Intact, Dental Advisory Given   Pulmonary former smoker,    breath sounds clear to auscultation       Cardiovascular hypertension,  Rhythm:Regular Rate:Normal     Neuro/Psych    GI/Hepatic   Endo/Other    Renal/GU      Musculoskeletal   Abdominal   Peds  Hematology   Anesthesia Other Findings   Reproductive/Obstetrics                          Anesthesia Physical Anesthesia Plan  ASA: III  Anesthesia Plan:    Post-op Pain Management:    Induction: Intravenous  Airway Management Planned: Oral ETT  Additional Equipment: Arterial line and CVP  Intra-op Plan:   Post-operative Plan:   Informed Consent: I have reviewed the patients History and Physical, chart, labs and discussed the procedure including the risks, benefits and alternatives for the proposed anesthesia with the patient or authorized representative who has indicated his/her understanding and acceptance.   Dental advisory given  Plan Discussed with: CRNA and Anesthesiologist  Anesthesia Plan Comments:         Anesthesia Quick Evaluation

## 2015-05-03 NOTE — Anesthesia Procedure Notes (Addendum)
Central Venous Catheter Insertion Performed by: anesthesiologist 05/03/2015 6:51 AM Patient location: Pre-op. Preanesthetic checklist: patient identified, IV checked, site marked, risks and benefits discussed, surgical consent, monitors and equipment checked, pre-op evaluation, timeout performed and anesthesia consent Position: Trendelenburg Lidocaine 1% used for infiltration Landmarks identified and Seldinger technique used Catheter size: 8 Fr Central line was placed.Double lumen Procedure performed using ultrasound guided technique. Attempts: 1 Following insertion, dressing applied, line sutured and Biopatch. Post procedure assessment: blood return through all ports. Patient tolerated the procedure well with no immediate complications.    Procedure Name: Intubation Date/Time: 05/03/2015 7:44 AM Performed by: Shirlyn Goltz Pre-anesthesia Checklist: Patient identified, Emergency Drugs available, Suction available, Patient being monitored and Timeout performed Patient Re-evaluated:Patient Re-evaluated prior to inductionOxygen Delivery Method: Circle system utilized Preoxygenation: Pre-oxygenation with 100% oxygen Intubation Type: IV induction Ventilation: Oral airway inserted - appropriate to patient size and Two handed mask ventilation required Laryngoscope Size: Mac and 4 Grade View: Grade I Endobronchial tube: Double lumen EBT, EBT position confirmed by auscultation and EBT position confirmed by fiberoptic bronchoscope and 39 Fr Number of attempts: 1 Airway Equipment and Method: Stylet and Oral airway Placement Confirmation: ETT inserted through vocal cords under direct vision,  positive ETCO2 and breath sounds checked- equal and bilateral Tube secured with: Tape Dental Injury: Teeth and Oropharynx as per pre-operative assessment

## 2015-05-03 NOTE — Transfer of Care (Signed)
Immediate Anesthesia Transfer of Care Note  Patient: Fernando [name]  Procedure(s) Performed: Procedure(s): LEFT VIDEO ASSISTED THORACOSCOPY (VATS)/ LEFT LOWER LOBECTOMY, ON-Q CATHETER PLACEMENT (Left)  Patient Location: PACU  Anesthesia Type:General  Level of Consciousness: awake, alert , oriented and patient cooperative  Airway & Oxygen Therapy: Patient Spontanous Breathing and Patient connected to nasal cannula oxygen  Post-op Assessment: Report given to RN, Post -op Vital signs reviewed and stable, Patient moving all extremities and Patient moving all extremities X 4  Post vital signs: Reviewed and stable  Last Vitals:  Filed Vitals:   05/03/15 0546 05/03/15 0547  BP: 149/110 140/98  Pulse: 78   Temp: 36.7 C   Resp: 20     Complications: No apparent anesthesia complications

## 2015-05-03 NOTE — Brief Op Note (Addendum)
05/03/2015  12:07 PM  PATIENT:  [name]  55 y.o. male  PRE-OPERATIVE DIAGNOSIS:  LEFT LOWER LOBE NODULE  POST-OPERATIVE DIAGNOSIS:  LEFT LOWER LOBE NODULE  PROCEDURE:   LEFT  VIDEO ASSISTED THORACOSCOPY (VATS),  THORACOSCOPIC LEFT LOWER LOBECTOMY,  MEDIASTINAL LYMPH NODE DISSECTION,  ON-Q CATHETER PLACEMENT   SURGEON:  Surgeon(s) and Role:    * Melrose Nakayama, MD - Primary  PHYSICIAN ASSISTANT: Lars Pinks PA-C  ANESTHESIA:   general  EBL:  Total I/O In: 1000 [I.V.:1000] Out: [id number] [Urine:535; Blood:450]  BLOOD ADMINISTERED:none  DRAINS: 26 Blake and 15 French chest tube placed in the left pleural space   LOCAL MEDICATIONS USED:  MARCAINE     SPECIMEN:  Source of Specimen:  Multiple lymph nodes and LLL  DISPOSITION OF SPECIMEN:  PATHOLOGY  COUNTS CORRECT:  YES  PLAN OF CARE: Admit to inpatient   PATIENT DISPOSITION:  PACU - hemodynamically stable.   Delay start of Pharmacological VTE agent (>24hrs) due to surgical blood loss or risk of bleeding: yes

## 2015-05-03 NOTE — Interval H&P Note (Signed)
History and Physical Interval Note:  05/03/2015 7:24 AM  Fernando [name]  has presented today for surgery, with the diagnosis of LEFT LOWER LOBE NODULE  The various methods of treatment have been discussed with the patient and family. After consideration of risks, benefits and other options for treatment, the patient has consented to  Procedure(s): VIDEO ASSISTED THORACOSCOPY (VATS)/ LEFT LOWER LOBECTOMY, ON-Q CATHETER PLACEMENT (Left) as a surgical intervention .  The patient's history has been reviewed, patient examined, no change in status, stable for surgery.  I have reviewed the patient's chart and labs.  Questions were answered to the patient's satisfaction.     Melrose Nakayama

## 2015-05-03 NOTE — Anesthesia Postprocedure Evaluation (Signed)
Anesthesia Post Note  Patient: Fernando [name]  Procedure(s) Performed: Procedure(s) (LRB): LEFT VIDEO ASSISTED THORACOSCOPY (VATS)/ LEFT LOWER LOBECTOMY, ON-Q CATHETER PLACEMENT (Left)  Patient location during evaluation: PACU Anesthesia Type: General Level of consciousness: awake, awake and alert and oriented Pain management: pain level controlled Vital Signs Assessment: post-procedure vital signs reviewed and stable Respiratory status: spontaneous breathing, nonlabored ventilation and respiratory function stable Cardiovascular status: blood pressure returned to baseline Anesthetic complications: no    Last Vitals:  Filed Vitals:   05/03/15 1432 05/03/15 1600  BP: 108/77   Pulse: 83   Temp: 36.8 C   Resp: 18 24    Last Pain:  Filed Vitals:   05/03/15 1643  PainSc: 8                  Fernando [name]

## 2015-05-03 NOTE — Progress Notes (Signed)
Pt admitted to 3300 from pacu. Oriented to unit and room. Safety discussed and call bell with in reach. VSS.  Family at bedside. Chest tubes x2 intact with very minimal airleak. Will continue to monitor. Erick Blinks, RN

## 2015-05-03 NOTE — H&P (View-Only) (Signed)
[address]       [place] [address]             [phone]       HPI: Fernando [name] returns today to discuss the results of his bronchoscopy.  He is a 55 yo man with a history of tobacco abuse (1.5 ppd x 15 years, quit in February 2017.). He also has a history of hypertension, some degree of CAD managed medically, cervical fusion, and multiple perirectal abscesses/ scrotal abscess. He recently was having perirectal pain and constipation. He was seen at [facility] As preparation for possible surgery he had a CXR which showed an opacity in the left lower lobe. The perirectal inflammation was managed nonoperatively. He was treated with antibiotics.  He saw Dr. Luan Pulling and a CT showed nodular consolidation in the left lower lobe. A PET CT showed the area was hypermetabolic and there was a hypermetabolic lymph node in the left hilum.  He has had a cough, mostly nonproductive but he has seen blood on a couple of occassions. He denies headaches or visual changes. He does complain of chest tightness and shortness of breath with exertion that has worsened over the past 2-3 weeks, which he attributes to quitting smoking. He has lost 10 pounds in the past 3 months. He has been off antibiotics for over a week and the pain and inability to defecate have worsened over the past couple of days. He denies HA or visual changes. 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  I did bronchoscopy and endobronchial ultrasound on 04/22/2015. No malignancy was seen. There was some nonspecific inflammation (no granulomas seen). Afterwards he says the following day he had a lot of abdominal pain due to coughing. He did have some hemoptysis which resolved within a few days.  Past Medical History  Diagnosis Date  . Hypertension 12/30/2011  . Allergy   . Peri-rectal abscess     multiple.   . Tobacco abuse   . Shortness of breath dyspnea     with exertion  .  Pneumonia   . Umbilical hernia     has been repaired  . Loose stools    Past Surgical History  Procedure Laterality Date  . Cervical fusion    . Facial cosmetic surgery    . Knee arthroscopy    . Appendectomy    . Hernia repair    . Spine surgery      cervical  . Cystoscopy N/A 05/01/2013    Procedure: CYSTOSCOPY;  Surgeon: Marissa Nestle, MD;  Location: [facility];  Service: Urology;  Laterality: N/A;  . Incision and drainage abscess Right 05/01/2013    Procedure: INCISION AND DRAINAGE RIGHT SCROTAL ABSCESS;  Surgeon: Marissa Nestle, MD;  Location: [facility];  Service: Urology;  Laterality: Right;  . Video bronchoscopy with endobronchial ultrasound N/A 04/22/2015    Procedure: VIDEO BRONCHOSCOPY WITH ENDOBRONCHIAL ULTRASOUND;  Surgeon: Melrose Nakayama, MD;  Location: [place];  Service: Thoracic;  Laterality: N/A;     Current Outpatient Prescriptions  Medication Sig Dispense Refill  . albuterol (PROVENTIL HFA;VENTOLIN HFA) 108 (90 Base) MCG/ACT inhaler Inhale 2 puffs into the lungs every 6 (six) hours as needed for wheezing or shortness of breath. 1 Inhaler 12  . ALPRAZolam (XANAX) 1 MG tablet Take 1 tablet (1 mg total) by mouth 3 (three) times daily as needed for anxiety. 30 tablet 0  .  budesonide-formoterol (SYMBICORT) 80-4.5 MCG/ACT inhaler Inhale 2 puffs into the lungs 2 (two) times daily. 1 Inhaler 12  . lisinopril-hydrochlorothiazide (PRINZIDE,ZESTORETIC) 20-12.5 MG tablet Take 1 tablet by mouth daily. 30 tablet 3  . promethazine (PHENERGAN) 25 MG tablet Take 1 tablet (25 mg total) by mouth every 6 (six) hours as needed for nausea or vomiting. 30 tablet 0  . saccharomyces boulardii (FLORASTOR) 250 MG capsule Take 1 capsule (250 mg total) by mouth 2 (two) times daily. 60 capsule 0   No current facility-administered medications for this visit.    Physical Exam BP 150/100 mmHg  Pulse 94  Resp 20  Ht 6\' 2"  (1.88 m)  Wt 249 lb (112.946 kg)  BMI 31.96 kg/m2  SpO2 75% Obese  55 year old man in no acute distress Alert and oriented 3 with no focal deficits No cervical or subclavicular adenopathy Cardiac regular rate and rhythm normal S1 and S2 Lungs clear with equal breath sound bilaterally, no rales or wheezing No peripheral edema  Diagnostic Tests: I personally reviewed the CT chest, PET/CT, and echocardiogram. I also reviewed the PFT, and MR brain results.  He has a left lower lobe spiculated mass that is hypermetabolic on PET. There also was a suggestion of a hilar lymph node. There is no evidence of metastatic disease. Pathology showed nonspecific inflammation.  Impression: Fernando [name] is a 55 year old man with a history of tobacco abuse who has a spiculated left lower lobe lung nodule that is hypermetabolic by PET CT. Bronchoscopy and endobronchial ultrasound showed only some inflammation. I discussed these results with Fernando [name] and his mother. They understand that this does not rule out the possibility of cancer. They understand that this could be strictly an inflammatory nodule, but the downside is that if we were to observe this radiographically and it is cancer then the cancer could grow or spread in the interim.  I did offer him the options of very graphic observation versus surgical resection. This is not amenable to a wedge resection so he would need a lobectomy from the start. We discussed the pros and cons of each approach. He feels very strongly that he wants a definitive diagnostic and therapeutic procedures done.  I discussed with him left VATS, left lower lobectomy, and On-Q local anesthetic catheter placement. We discussed the indications risks benefits and alternatives. I informed him of the general nature of the procedure, the incisions to be used, the need for drainage tubes postoperatively, the expected hospital stay and overall recovery. He understands that no guarantee of cure can be given. He understands the risks include, but are not limited to  death, MI, DVT, PE, bleeding, possible need for transfusion, infection, prolonged air leak, cardiac arrhythmias, renal or respiratory failure, as well as the possibility of other unforeseeable complications.  He accepts the risk and wishes to proceed.  He has already seen Dorris Carnes and his echocardiogram showed normal left ventricular function. The echo did raise a concern for possible ascending aortic aneurysm. On CT his aorta measures 4.0 cm.  Plan: Left VATS, left lower lobectomy, On-Q local anesthetic catheter placement on Monday, 05/03/2015.  Melrose Nakayama, MD [facility] [phone]

## 2015-05-04 ENCOUNTER — Inpatient Hospital Stay (HOSPITAL_COMMUNITY): Payer: Self-pay

## 2015-05-04 ENCOUNTER — Encounter (HOSPITAL_COMMUNITY): Payer: Self-pay | Admitting: Thoracic Surgery (Cardiothoracic Vascular Surgery)

## 2015-05-04 LAB — CBC
HCT: 37.4 % — ABNORMAL LOW (ref 39.0–52.0)
HEMOGLOBIN: 12.7 g/dL — AB (ref 13.0–17.0)
MCH: 32.4 pg (ref 26.0–34.0)
MCHC: 34 g/dL (ref 30.0–36.0)
MCV: 95.4 fL (ref 78.0–100.0)
Platelets: 179 10*3/uL (ref 150–400)
RBC: 3.92 MIL/uL — ABNORMAL LOW (ref 4.22–5.81)
RDW: 14.9 % (ref 11.5–15.5)
WBC: 12.4 10*3/uL — ABNORMAL HIGH (ref 4.0–10.5)

## 2015-05-04 LAB — BASIC METABOLIC PANEL
Anion gap: 10 (ref 5–15)
BUN: 7 mg/dL (ref 6–20)
CALCIUM: 8 mg/dL — AB (ref 8.9–10.3)
CO2: 25 mmol/L (ref 22–32)
Chloride: 100 mmol/L — ABNORMAL LOW (ref 101–111)
Creatinine, Ser: 0.96 mg/dL (ref 0.61–1.24)
GFR calc Af Amer: >60 mL/min (ref >60)
GLUCOSE: 119 mg/dL — AB (ref 65–99)
Potassium: 3.7 mmol/L (ref 3.5–5.1)
Sodium: 135 mmol/L (ref 135–145)

## 2015-05-04 LAB — GLUCOSE, CAPILLARY
GLUCOSE-CAPILLARY: 125 mg/dL — AB (ref 65–99)
Glucose-Capillary: 132 mg/dL — ABNORMAL HIGH (ref 65–99)
Glucose-Capillary: 120 mg/dL — ABNORMAL HIGH (ref 65–99)

## 2015-05-04 LAB — BLOOD GAS, ARTERIAL
ACID-BASE EXCESS: 2.5 mmol/L — AB (ref 0.0–2.0)
BICARBONATE: 26.9 meq/L — AB (ref 20.0–24.0)
O2 CONTENT: 2 L/min
O2 SAT: 90.7 %
PATIENT TEMPERATURE: 98.6
PO2 ART: 61.9 mmHg — AB (ref 80.0–100.0)
TCO2: 28.3 mmol/L (ref 0–100)
pCO2 arterial: 44.5 mmHg (ref 35.0–45.0)
pH, Arterial: 7.398 (ref 7.350–7.450)

## 2015-05-04 LAB — POCT I-STAT 7, (LYTES, BLD GAS, ICA,H+H)
Acid-base deficit: 2 mmol/L (ref 0.0–2.0)
BICARBONATE: 24.2 meq/L — AB (ref 20.0–24.0)
Calcium, Ion: 1.12 mmol/L (ref 1.12–1.23)
HEMATOCRIT: 37 % — AB (ref 39.0–52.0)
Hemoglobin: 12.6 g/dL — ABNORMAL LOW (ref 13.0–17.0)
O2 Saturation: 86 %
PCO2 ART: 42.6 mmHg (ref 35.0–45.0)
POTASSIUM: 3.5 mmol/L (ref 3.5–5.1)
Patient temperature: 35.4
Sodium: 139 mmol/L (ref 135–145)
TCO2: 26 mmol/L (ref 0–100)
pH, Arterial: 7.356 (ref 7.350–7.450)
pO2, Arterial: 49 mmHg — ABNORMAL LOW (ref 80.0–100.0)

## 2015-05-04 MED ORDER — ENOXAPARIN SODIUM 40 MG/0.4ML ~~LOC~~ SOLN
40.0000 mg | SUBCUTANEOUS | Status: DC
  Administered 2015-05-04 – 2015-05-07 (×4): 40 mg via SUBCUTANEOUS
  Filled 2015-05-04 (×4): qty 0.4

## 2015-05-04 MED ORDER — LISINOPRIL 10 MG PO TABS
10.0000 mg | ORAL_TABLET | Freq: Every day | ORAL | Status: DC
  Administered 2015-05-04 – 2015-05-05 (×2): 10 mg via ORAL
  Filled 2015-05-04 (×2): qty 1

## 2015-05-04 MED ORDER — HYDROCHLOROTHIAZIDE 12.5 MG PO CAPS
12.5000 mg | ORAL_CAPSULE | Freq: Every day | ORAL | Status: DC
  Administered 2015-05-04 – 2015-05-07 (×4): 12.5 mg via ORAL
  Filled 2015-05-04 (×4): qty 1

## 2015-05-04 NOTE — Progress Notes (Addendum)
      [address]       [place] [address]             [phone]       1 Day Post-Op Procedure(s) (LRB): LEFT VIDEO ASSISTED THORACOSCOPY (VATS)/ LEFT LOWER LOBECTOMY, ON-Q CATHETER PLACEMENT (Left)  Subjective: Patient with some incisional pain  Objective: Vital signs in last 24 hours: Temp:  [97.7 F (36.5 C)-98.2 F (36.8 C)] 97.7 F (36.5 C) (04/25 0415) Pulse Rate:  [79-85] 80 (04/25 0420) Cardiac Rhythm:  [-] Normal sinus rhythm (04/25 0420) Resp:  [18-30] 24 (04/25 0420) BP: (104-139)/(68-101) 137/90 mmHg (04/25 0420) SpO2:  [93 %-97 %] 94 % (04/25 0420) Arterial Line BP: (94-138)/(46-92) 138/83 mmHg (04/24 2340) Weight:  [248 lb 14.4 oz (112.9 kg)] 248 lb 14.4 oz (112.9 kg) (04/24 1432)     Intake/Output from previous day: 04/24 0701 - 04/25 0700 In: 3530 [P.O.:600; I.V.:2930] Out: 2895 [Urine:1860; Blood:450; Chest Tube:585]   Physical Exam:  Cardiovascular: RRR Pulmonary: Clear to auscultation on right and coarse on left Abdomen: Soft, non tender, bowel sounds present. Extremities: Trace bilateral lower extremity edema. Wounds: Clean and dry.  No erythema or signs of infection. Chest Tubes:Tidling with cough but no true air leak  Lab Results: CBC: Recent Labs  05/03/15 0901 05/04/15 0410  WBC  --  12.4*  HGB 12.6* 12.7*  HCT 37.0* 37.4*  PLT  --  179   BMET:  Recent Labs  05/03/15 0901 05/04/15 0410  NA 139 135  K 3.5 3.7  CL  --  100*  CO2  --  25  GLUCOSE  --  119*  BUN  --  7  CREATININE  --  0.96  CALCIUM  --  8.0*    PT/INR: No results for input(s): LABPROT, INR in the last 72 hours. ABG:  INR: Will add last result for INR, ABG once components are confirmed Will add last 4 CBG results once components are confirmed  Assessment/Plan:  1. CV - SR in the 80's this am. Will restart low dose Lisinopril and HCTZ. 2.  Pulmonary - Chest tubes with 585 cc since surgery. There is tidling with cough but no true air  leak. CXR this am appears stable. Encourage incentive spirometer 3. H and H stable at 12.7 and 37.4 4. Supplement potassium 5. Remove a line 6. Advance diet and decrease IVF  ZIMMERMAN,DONIELLE MPA-C 05/04/2015,7:29 AM Patient seen and examined, agree with above Will start enoxaparin CT to water seal  Remo Lipps C. Roxan Hockey, MD [facility] [phone]

## 2015-05-04 NOTE — Op Note (Signed)
NAMEEDREI, LOEBER                 ACCOUNT NO.:  [id number]  MEDICAL RECORD NO.:  [id number]  LOCATION:  3S16C                        FACILITY:  [facility]  PHYSICIAN:  Revonda Standard. Roxan Hockey, M.D.DATE OF BIRTH:  10-24-1960  DATE OF PROCEDURE:  05/03/2015 DATE OF DISCHARGE:                              OPERATIVE REPORT   PREOPERATIVE DIAGNOSIS:  Left lower lobe nodule with adenopathy.  POSTOPERATIVE DIAGNOSIS:  Left lower lobe nodule with adenopathy.  PROCEDURE:   Left video-assisted thoracoscopy Thoracoscopic left lower lobectomy Mediastinal lymph node dissection On-Q local anesthetic catheter placement.  SURGEON:  Revonda Standard. Roxan Hockey, M.D.  ASSISTANT:  Lars Pinks, PA  ANESTHESIA:  General.  FINDINGS:  Multiple enlarged nodes with surrounding inflammatory reaction.  No definite mass in left lower lobe.  CLINICAL NOTE:  Mr. Belew is a 55 year old man with a history of tobacco abuse who recently was being evaluated for a perirectal abscess.  As part of his preoperative workup, he had a chest x-ray which showed an opacity in the left lower lobe.  A CT scan showed nodular consolidation in the left lower lobe, and on PET CT it was hypermetabolic and there also were hypermetabolic left hilar lymph nodes.  The patient has undergone bronchoscopy and endobronchial ultrasound, which was nondiagnostic.  The options of radiographic followup versus surgical resection were discussed in detail with the patient.  He very strongly desired to have surgical resection for definitive diagnosis with the understanding and it was possible this was not a lung cancer.  The indications, risks, benefits, and alternatives were discussed in detail with the patient. He understood and accepted the risks and wished to proceed.  OPERATIVE NOTE:  Mr. Brunkhorst was brought to the preoperative holding area on May 03, 2015.  Anesthesia placed a central line and an arterial blood pressure monitoring line.   He was taken to the operating room, anesthetized, and intubated with a double-lumen endotracheal tube.  A Foley catheter was placed.  Sequential compression devices were placed on the calves for DVT prophylaxis.  Intravenous antibiotics were administered.  He was placed in a right lateral decubitus position and the left chest was prepped and draped in usual sterile fashion.  Single lung ventilation of the right lung was initiated, and it was tolerated well throughout the procedure.  An incision was made in the seventh intercostal space in the midaxillary line.  A 5 mm port was inserted into the chest.  A second port type incision was made anteriorly for additional instrumentation.  A working incision 5 cm in length was made in the fourth interspace anterolaterally.  No rib spreading was performed during the procedure. The lung was inspected.  There was no pleural effusion.  The fissure was complete posteriorly, but incomplete anteriorly.  The inferior ligament was divided with the Harmonic Scalpel.  There were multiple level 9 lymph nodes, one of which was enlarged.  This was removed and sent for frozen section, which returned showing a benign lymph node.  The pleural reflection divided the hilum anteriorly and posteriorly. Initially, we began at the hilum anteriorly; however, visualization was limited in this area due to the patient's body habitus and incomplete fissure.  The fissure was partially completed with 2 firings of an endoscopic stapler.  A 45 mm Ethicon powered stapler was used with gold cartridges.  The pleura overlying the pulmonary artery was incised posteriorly and dissection then was carried from anterior and posterior until the remainder the fissure could be encircled.  This was divided with a stapler.  It was then apparent that there was a large level 11 lymph node that had been making the completion of the fissure difficult. This was surrounded by an inflammatory  reaction.  They actually were too large level 11 nodes at the bifurcation of the upper and lower lobe bronchi.  These were resected.  The inferior pulmonary vein then was encircled and divided with an endoscopic vascular stapler.  The basilar segmental branches of the lower lobe pulmonary artery were encircled and divided with the endoscopic vascular stapler.  All lymph nodes that were removed during the dissection of the vessels were sent as separate specimens for permanent pathology.  There were 2 superior segmental branches, which were encircled and divided separately.  The bronchus was now isolated.  A green cartridge was used on the powered stapler. It was placed across the lower lobe bronchus and closed.  A test inflation showed good aeration of the upper lobe.  The stapler was fired transecting the bronchus.  The lower lobe was placed into an endoscopic retrieval bag and removed and sent to pathology.  The subcarinal space was explored and level 7 node was removed.  The pleura overlying the aortopulmonary window was opened, and there were 2 large level 5 nodes that were removed using the Harmonic Scalpel.  The chest was copiously irrigated with warm saline.  A test inflation to 30 cm of water showed no leakage from the bronchial stump.  An On-Q local anesthetic catheter was placed through a separate stab incision posteriorly and tunneled in a subpleural location.  It was primed with 10 mL of 0.5% Marcaine.  A 28- [place] Blake drain was placed through the original port incision and directed posteriorly.  A 28-French chest tube was placed through the second port incision and directed anteriorly.  Left upper lobe was reinflated.  The working incision was closed in 3 layers.  Dermabond was applied.  The chest tubes were placed to suction.  The patient was placed back in a supine position.  He was extubated in the operating room and taken to the postanesthetic care unit in good  condition.  Case was discussed with Dr. Saralyn Pilar of Pathology.  In addition to standard Pathology, if there is no definite tumor seen, lymph nodes will be evaluated for the possibility of lymphoma.     Revonda Standard Roxan Hockey, M.D.     SCH/MEDQ  D:  05/04/2015  T:  05/04/2015  Job:  [id number]

## 2015-05-05 ENCOUNTER — Inpatient Hospital Stay (HOSPITAL_COMMUNITY): Payer: Self-pay

## 2015-05-05 LAB — CBC
HEMATOCRIT: 37.5 % — AB (ref 39.0–52.0)
HEMOGLOBIN: 12.5 g/dL — AB (ref 13.0–17.0)
MCH: 31.6 pg (ref 26.0–34.0)
MCHC: 33.3 g/dL (ref 30.0–36.0)
MCV: 94.7 fL (ref 78.0–100.0)
Platelets: 178 10*3/uL (ref 150–400)
RBC: 3.96 MIL/uL — AB (ref 4.22–5.81)
RDW: 14.6 % (ref 11.5–15.5)
WBC: 11 10*3/uL — AB (ref 4.0–10.5)

## 2015-05-05 LAB — COMPREHENSIVE METABOLIC PANEL
ALBUMIN: 2.9 g/dL — AB (ref 3.5–5.0)
ALK PHOS: 57 U/L (ref 38–126)
ALT: 23 U/L (ref 17–63)
AST: 36 U/L (ref 15–41)
Anion gap: 9 (ref 5–15)
BILIRUBIN TOTAL: 1 mg/dL (ref 0.3–1.2)
CALCIUM: 8.3 mg/dL — AB (ref 8.9–10.3)
CO2: 27 mmol/L (ref 22–32)
CREATININE: 1 mg/dL (ref 0.61–1.24)
Chloride: 97 mmol/L — ABNORMAL LOW (ref 101–111)
GFR calc Af Amer: >60 mL/min (ref >60)
GFR calc non Af Amer: >60 mL/min (ref >60)
GLUCOSE: 125 mg/dL — AB (ref 65–99)
Potassium: 3.4 mmol/L — ABNORMAL LOW (ref 3.5–5.1)
SODIUM: 133 mmol/L — AB (ref 135–145)
TOTAL PROTEIN: 6.4 g/dL — AB (ref 6.5–8.1)

## 2015-05-05 MED ORDER — POTASSIUM CHLORIDE CRYS ER 20 MEQ PO TBCR
20.0000 meq | EXTENDED_RELEASE_TABLET | Freq: Once | ORAL | Status: AC
  Administered 2015-05-05: 20 meq via ORAL
  Filled 2015-05-05: qty 1

## 2015-05-05 MED ORDER — POTASSIUM CHLORIDE CRYS ER 20 MEQ PO TBCR
40.0000 meq | EXTENDED_RELEASE_TABLET | Freq: Once | ORAL | Status: AC
  Administered 2015-05-05: 40 meq via ORAL
  Filled 2015-05-05: qty 2

## 2015-05-05 NOTE — Progress Notes (Signed)
Anterior chest tube dc'd per order. Pt tolerated procedure well. Vitals remain stable. New dressing applied, will continue to monitor.

## 2015-05-05 NOTE — Discharge Instructions (Signed)
Thoracoscopy, Care After °Refer to this sheet in the next few weeks. These instructions provide you with information about caring for yourself after your procedure. Your health care provider may also give you more specific instructions. Your treatment has been planned according to current medical practices, but problems sometimes occur. Call your health care provider if you have any problems or questions after your procedure. °WHAT TO EXPECT AFTER THE PROCEDURE: °After your procedure, it is common to feel sore for up to two weeks. °HOME CARE INSTRUCTIONS °· There are many different ways to close and cover an incision, including stitches (sutures), skin glue, and adhesive strips. Follow your health care provider's instructions about: °¨ Incision care. °¨ Bandage (dressing) changes and removal. °¨ Incision closure removal. °· Check your incision area every day for signs of infection. Watch for: °¨ Redness, swelling, or pain. °¨ Fluid, blood, or pus. °· Take medicines only as directed by your health care provider. °· Try to cough often. Coughing helps to protect against lung infection (pneumonia). It may hurt to cough. If this happens, hold a pillow against your chest when you cough. °· Take deep breaths. This also helps to protect against pneumonia. °· If you were given an incentive spirometer, use it as directed by your health care provider. °· Do not take baths, swim, or use a hot tub until your health care provider approves. You may take showers. °· Avoid lifting until your health care provider approves. °· Avoid driving until your health care provider approves. °· Do not travel by airplane after the chest tube is removed until your health care provider approves. °SEEK MEDICAL CARE IF: °· You have a fever. °· Pain medicines do not ease your pain. °· You have redness, swelling, or increasing pain in your incision area. °· You develop a cough that does not go away, or you are coughing up mucus that is yellow or  green. °SEEK IMMEDIATE MEDICAL CARE IF: °· You have fluid, blood, or pus coming from your incision. °· There is a bad smell coming from your incision or dressing. °· You develop a rash. °· You have difficulty breathing. °· You cough up blood. °· You develop light-headedness or you feel faint. °· You develop chest pain. °· Your heartbeat feels irregular or very fast. °  °This information is not intended to replace advice given to you by your health care provider. Make sure you discuss any questions you have with your health care provider. °  °Document Released: 07/15/2004 Document Revised: 01/16/2014 Document Reviewed: 09/10/2013 °Elsevier Interactive Patient Education ©2016 Elsevier Inc. ° °

## 2015-05-05 NOTE — Discharge Summary (Signed)
Physician Discharge Summary       [address]       [employer],[place] [address]             [phone]    Patient ID: GRANITE DOWSETT MRN: [id number] DOB/AGE: 08/06/1960 55 y.o.  Admit date: 05/03/2015 Discharge date: 05/07/2015  Admission Diagnoses: Left lower lobe nodule with adenopathy  Active Diagnoses:  1. Hypertension 2. Tobacco abuse 3. Peri rectal abcess 4. Pneumonia 5. Mild ABL anemia  Procedure (s):  Left video-assisted thoracoscopy, thoracoscopic left lower lobectomy, mediastinal lymph node dissection, and On-Q local anesthetic catheter placement.by Dr. Roxan Hockey on 05/03/2015.  Pathology:  History of Presenting Illness: He is a 55 yo man with a history of tobacco abuse (1.5 ppd x 15 years, quit one month ago.). He also has a history of hypertension, some degree of CAD managed medically, cervical fusion, and multiple perirectal abscesses/ scrotal abscess. He recently was having perirectal pain and constipation. He was seen at [facility]. As preparation for possible surgery he had a CXR which showed an opacity in the left lower lobe. The perirectal inflammation was managed nonoperatively. He was treated with antibiotics.  He saw Dr. Luan Pulling and a CT showed nodular consolidation in the left lower lobe. A PET CT showed the area was hypermetabolic and there was a hypermetabolic lymph node in the left hilum.  He has had a cough, mostly nonproductive but he has seen blood on a couple of occassions. He denies headaches or visual changes. He does complain of chest tightness and shortness of breath with exertion that has worsened over the past 2-3 weeks, which he attributes to quitting smoking. He has lost 10 pounds in the past 3 months. He has been off antibiotics for over a week and the pain and inability to defecate have worsened over the past couple of days. He denies HA or visual changes. 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  Brief Hospital Course:  He remained afebrile and hemodynamically stable. A line and foley were removed early in his post operative course.  Chest tube output gradually decreased. Daily chest x rays were obtained and remained stable. All chest tubes were removed by 04/27. On Q was removed on post operative day 3. He had hypokalemia a few times and was supplemented accordingly. He was ambulating on room air. He was tolerating a diet and had a bowel movement. His wound is clean and dry. He is felt surgically stable for discharge today.   Latest Vital Signs: Blood pressure 117/73, pulse 81, temperature 98.3 F (36.8 C), temperature source Oral, resp. rate 17, height 6\' 2"  (1.88 m), weight 248 lb 14.4 oz (112.9 kg), SpO2 94 %.  Physical Exam: Cardiovascular: RRR Pulmonary: Clear to auscultation on right and diminished left base Abdomen: Soft, non tender, bowel sounds present. Extremities: Trace bilateral lower extremity edema. Wounds: Clean and dry. No erythema or signs of infection. leak  Discharge Condition:Stable and discharged to home  Recent laboratory studies:  Lab Results  Component Value Date   WBC 11.0* 05/05/2015   HGB 12.5* 05/05/2015   HCT 37.5* 05/05/2015   MCV 94.7 05/05/2015   PLT 178 05/05/2015   Lab Results  Component Value Date   NA 137 05/07/2015   K 3.4* 05/07/2015   CL 99* 05/07/2015   CO2 29 05/07/2015   CREATININE 1.05 05/07/2015   GLUCOSE 103* 05/07/2015      Diagnostic Studies: Dg  Chest 2 View  05/07/2015  CLINICAL DATA:  Atelectasis, left lung surgery on Monday EXAM: CHEST  2 VIEW COMPARISON:  05/06/2015 FINDINGS: Cardiomediastinal silhouette is stable. Again noted mild elevation of the left hemidiaphragm. Left basilar atelectasis. Right lung is clear. There is no pneumothorax. Metallic fixation plate cervical spine again noted. No pulmonary edema. IMPRESSION: Mild elevation of the left hemidiaphragm again noted. Left basilar atelectasis. No  pneumothorax. Right lung is clear. No pulmonary edema. Electronically Signed   By: Lahoma Crocker M.D.   On: 05/07/2015 08:35   Dg Chest 2 View  04/30/2015  CLINICAL DATA:  Status post left lower lobectomy EXAM: CHEST  2 VIEW COMPARISON:  Chest x-ray of April 24, 2015 FINDINGS: The lungs are adequately inflated. The interstitial markings are coarse though stable. The heart is normal in size. The pulmonary vascularity is not engorged. There is tortuosity of the descending thoracic aorta. There is no pleural effusion or pneumothorax. The patient has undergone previous lower anterior cervical fusion. IMPRESSION: No active cardiopulmonary disease. Electronically Signed   By: David  Martinique M.D.   On: 04/30/2015 09:13   Dg Chest 2 View  04/22/2015  CLINICAL DATA:  Shortness of breath with exertion. EXAM: CHEST  2 VIEW COMPARISON:  Radiograph of March 07, 2015. PET scan of March 16, 2015. FINDINGS: The heart size and mediastinal contours are within normal limits. Both lungs are clear. Left lower lobe mass noted on prior PET scan is not well visualized on this study. No pneumothorax or pleural effusion is noted. The visualized skeletal structures are unremarkable. IMPRESSION: No definite radiographic abnormality seen at this time. Left lower lobe mass noted on prior PET scan is not well visualized on this study. Electronically Signed   By: Marijo Conception, M.D.   On: 04/22/2015 07:52   Dg Chest 1v Repeat Same Day  05/06/2015  CLINICAL DATA:  Status post video-assisted thorascopic left lobectomy. Status post chest tube removal. EXAM: CHEST - 1 VIEW SAME DAY COMPARISON:  Multiple prior exams, most recent dated 05/06/2015 at 7:18 p.m. FINDINGS: Left chest tube removed since the earlier study. No convincing apical pneumothorax on this semi-erect study. There is opacity with a well-defined lobulated margins along the central left lung extending to the base. This is consistent with partly atelectatic left lung. Possibility  of an anterior pneumothorax should be considered. There are low lung volumes on the right.  Right lung is clear. Left internal jugular central venous line has also been removed. IMPRESSION: 1. Status post removal of the remaining support apparatus occluding the left internal jugular central venous line and left chest tube. 2. Tiny apical pneumothorax seen on the earlier exam is not appreciated currently. 3. Central left mid to lower lung zone opacity, more prominent on the earlier study, likely due to partial atelectasis. Consider possible anterior pneumothorax, which would be best evaluated with chest CT or, when patient can tolerate, an upright frontal and lateral chest radiograph. Electronically Signed   By: Lajean Manes M.D.   On: 05/06/2015 16:34   Dg Chest Port 1 View  05/06/2015  CLINICAL DATA:  Followup pneumothorax EXAM: PORTABLE CHEST 1 VIEW COMPARISON:  04/14/2025 FINDINGS: There remains a tiny apical left pneumothorax although slightly improved when compared with the prior exam. A left jugular catheter is again noted in the proximal superior vena cava. Interval removal of 1 chest tube is noted. A residual chest tube is seen. Left basilar atelectasis remains. The right lung remains clear. IMPRESSION: Tubes and  lines as described. Tiny residual left apical pneumothorax although improved from the previous day. Left basilar atelectasis is again noted. Electronically Signed   By: Inez Catalina M.D.   On: 05/06/2015 08:29   Dg Chest Port 1 View  05/05/2015  CLINICAL DATA:  Pneumothorax. EXAM: PORTABLE CHEST 1 VIEW COMPARISON:  05/04/2015 and 05/03/2015 FINDINGS: Two left-sided chest tubes in place. Tiny residual left apical pneumothorax. Left central line appears in good position. Heart size and pulmonary vascularity are normal. Right lung is clear. Minimal atelectasis at the left lung base. IMPRESSION: The tiny residual left apical pneumothorax. Minimal atelectasis at the left lung base. Electronically  Signed   By: Lorriane Shire M.D.   On: 05/05/2015 08:09   Dg Chest Port 1 View  05/04/2015  CLINICAL DATA:  Pneumothorax. EXAM: PORTABLE CHEST 1 VIEW COMPARISON:  05/03/2015. FINDINGS: Two left chest tubes and left IJ line in stable position. Bibasilar atelectasis and/or infiltrates. Stable cardiomegaly. No significant pleural effusion. No pneumothorax. Prior cervical spine fusion. IMPRESSION: 1. Left IJ line and 2 left chest tubes in stable position. No pneumothorax. 2. Bibasilar atelectasis and/or infiltrates . Electronically Signed   By: Marcello Moores  Register   On: 05/04/2015 07:44   Dg Chest Port 1 View  05/03/2015  CLINICAL DATA:  Status post left VATS EXAM: PORTABLE CHEST 1 VIEW COMPARISON:  04/30/2015 FINDINGS: The jugular central line is noted. The cardiac shadow is prominent due to a poor inspiratory effort. No pneumothorax is seen. A left-sided thoracostomy catheter and adjacent on cube catheter are noted. Postsurgical changes consistent with the left lower lobectomy are seen. No acute bony abnormality is noted. Changes of prior cervical fusion are noted. IMPRESSION: Postoperative change with tubes and lines as described. Electronically Signed   By: Inez Catalina M.D.   On: 05/03/2015 13:08   Dg Abd Acute W/chest  04/24/2015  CLINICAL DATA:  Acute onset of sharp upper abdominal pain, radiating to the groin. Constipation. Initial encounter. EXAM: DG ABDOMEN ACUTE W/ 1V CHEST COMPARISON:  Chest radiograph performed 04/22/2015, and PET/CT performed 03/16/2015 FINDINGS: The lungs are well-aerated. Mild peribronchial thickening is noted. There is no evidence of focal opacification, pleural effusion or pneumothorax. The cardiomediastinal silhouette is within normal limits. The visualized bowel gas pattern is unremarkable. Scattered stool and air are seen within the colon; there is no evidence of small bowel dilatation to suggest obstruction. No free intra-abdominal air is identified on the provided upright  view. No acute osseous abnormalities are seen; the sacroiliac joints are unremarkable in appearance. Cervical spinal fusion hardware is noted. IMPRESSION: 1. Unremarkable bowel gas pattern; no free intra-abdominal air seen. Small amount of stool noted in the colon. 2. Mild peribronchial thickening noted. Lungs remain otherwise clear. Electronically Signed   By: Garald Balding M.D.   On: 04/24/2015 00:28   Dg C-arm Bronchoscopy  04/22/2015  CLINICAL DATA:  C-ARM BRONCHOSCOPY Fluoroscopy was utilized by the requesting physician.  No radiographic interpretation.    Discharge Medications:   Medication List    STOP taking these medications        promethazine 25 MG tablet  Commonly known as:  PHENERGAN      TAKE these medications        albuterol 108 (90 Base) MCG/ACT inhaler  Commonly known as:  PROVENTIL HFA;VENTOLIN HFA  Inhale 2 puffs into the lungs every 6 (six) hours as needed for wheezing or shortness of breath.     ALPRAZolam 1 MG tablet  Commonly known as:  XANAX  Take 1 tablet (1 mg total) by mouth 3 (three) times daily as needed for anxiety.     budesonide-formoterol 80-4.5 MCG/ACT inhaler  Commonly known as:  SYMBICORT  Inhale 2 puffs into the lungs 2 (two) times daily.     lisinopril-hydrochlorothiazide 20-12.5 MG tablet  Commonly known as:  PRINZIDE,ZESTORETIC  Take 1 tablet by mouth daily.     oxyCODONE 5 MG immediate release tablet  Commonly known as:  Oxy IR/ROXICODONE  Take 1-2 tablets (5-10 mg total) by mouth every 4 (four) hours as needed for severe pain.        Follow Up Appointments: Follow-up Information    Follow up with Melrose Nakayama, MD On 05/25/2015.   Specialty:  Cardiothoracic Surgery   Why:  PA/LAT CXR to be taken (at [facility] which is in the same building as Dr. Leonarda Salon office) on at ;Appointment   Contact information:   [place] [address] [phone]       Follow up with Nurse On  05/21/2015.   Why:  Appointment is with nurse only to have chest tube sutures removed. Appointment time is at 10;00 am   Contact information:   [place] [address] [phone]      Follow up with [facility].   Why:  please call on Monday at 9 am to schedule an apt  for hospital follow up   Contact information:   [place] [phone]      Signed: Jadene Pierini EPA-C 05/07/2015, 11:13 AM

## 2015-05-05 NOTE — Progress Notes (Addendum)
      [address]       [place] [address]             [phone]       2 Days Post-Op Procedure(s) (LRB): LEFT VIDEO ASSISTED THORACOSCOPY (VATS)/ LEFT LOWER LOBECTOMY, ON-Q CATHETER PLACEMENT (Left)  Subjective: Patient with a lot of "gas pain" last night. Feels better this am. He already walked 300 feet.  Objective: Vital signs in last 24 hours: Temp:  [97.7 F (36.5 C)-98.4 F (36.9 C)] 98.1 F (36.7 C) (04/26 0737) Pulse Rate:  [76-91] 81 (04/26 0330) Cardiac Rhythm:  [-] Normal sinus rhythm (04/26 0330) Resp:  [18-30] 18 (04/26 0330) BP: (110-126)/(80-103) 126/80 mmHg (04/26 0330) SpO2:  [92 %-96 %] 94 % (04/26 0330) Arterial Line BP: (143)/(104) 143/104 mmHg (04/25 0838)     Intake/Output from previous day: 04/25 0701 - 04/26 0700 In: 1444.2 [I.V.:1344.2; IV Piggyback:100] Out: [id number] [Urine:1550; Chest Tube:300]   Physical Exam:  Cardiovascular: RRR Pulmonary: Clear to auscultation on right and diminished left base Abdomen: Soft, non tender, bowel sounds present. Extremities: Trace bilateral lower extremity edema. Wounds: Clean and dry.  No erythema or signs of infection. Chest Tubes:Some tidling with cough but no true air leak  Lab Results: CBC:  Recent Labs  05/04/15 0410 05/05/15 0340  WBC 12.4* 11.0*  HGB 12.7* 12.5*  HCT 37.4* 37.5*  PLT 179 178   BMET:   Recent Labs  05/04/15 0410 05/05/15 0340  NA 135 133*  K 3.7 3.4*  CL 100* 97*  CO2 25 27  GLUCOSE 119* 125*  BUN 7 <5*  CREATININE 0.96 1.00  CALCIUM 8.0* 8.3*    PT/INR: No results for input(s): LABPROT, INR in the last 72 hours. ABG:  INR: Will add last result for INR, ABG once components are confirmed Will add last 4 CBG results once components are confirmed  Assessment/Plan:  1. CV - SR in the 80's this am. Continue low dose Lisinopril and HCTZ. 2.  Pulmonary - Chest tubes with 300 cc last 24 hours. There is some tidling with cough but no true air  leak. CXR this am appears stable (tiny left apical pneumothorax). Likely remove one chest tube today.Encourage incentive spirometer and will get a flutter valve to help improve atelectasis. Final pathology not available yet. 3. H and H stable at 12.5 and 37.5 4. Supplement potassium 5. Foley removed at 6 am 6. Will remove on Q in am  ZIMMERMAN,DONIELLE MPA-C 05/05/2015,7:43 AM  Patient seen and examined, agree with above Dc anterior CT Continue ambulation  Bergen Magner C. Roxan Hockey, MD [facility] [phone]

## 2015-05-06 ENCOUNTER — Inpatient Hospital Stay (HOSPITAL_COMMUNITY): Payer: Self-pay

## 2015-05-06 MED ORDER — SIMETHICONE 80 MG PO CHEW
80.0000 mg | CHEWABLE_TABLET | Freq: Four times a day (QID) | ORAL | Status: DC | PRN
  Administered 2015-05-06 – 2015-05-07 (×5): 80 mg via ORAL
  Filled 2015-05-06 (×5): qty 1

## 2015-05-06 MED ORDER — LISINOPRIL 20 MG PO TABS
20.0000 mg | ORAL_TABLET | Freq: Every day | ORAL | Status: DC
  Administered 2015-05-06 – 2015-05-07 (×2): 20 mg via ORAL
  Filled 2015-05-06 (×2): qty 1

## 2015-05-06 NOTE — Progress Notes (Addendum)
                    [address]            [place] [address]          [phone]   3 Days Post-Op Procedure(s) (LRB): LEFT VIDEO ASSISTED THORACOSCOPY (VATS)/ LEFT LOWER LOBECTOMY, ON-Q CATHETER PLACEMENT (Left)  Total Length of Stay:  LOS: 3 days   Subjective: conts to feel better, CXR has been done but not in computer 250 ml CT drainage yesterday No air leak  Objective: Vital signs in last 24 hours: Temp:  [98.2 F (36.8 C)-100.2 F (37.9 C)] 98.2 F (36.8 C) (04/27 0739) Pulse Rate:  [82-109] 84 (04/27 0350) Cardiac Rhythm:  [-] Normal sinus rhythm (04/27 0350) Resp:  [13-30] 16 (04/27 0350) BP: (101-138)/(70-100) 138/100 mmHg (04/27 0350) SpO2:  [91 %-100 %] 95 % (04/27 0350)  Filed Weights   05/03/15 0546 05/03/15 1432  Weight: 249 lb (112.946 kg) 248 lb 14.4 oz (112.9 kg)    Weight change:    Hemodynamic parameters for last 24 hours:    Intake/Output from previous day: 04/26 0701 - 04/27 0700 In: 326.2 [I.V.:326.2] Out: 1875 [Urine:1625; Chest Tube:250]  Intake/Output this shift:    Current Meds: Scheduled Meds: . acetaminophen  1,000 mg Oral Q6H   Or  . acetaminophen (TYLENOL) oral liquid 160 mg/5 mL  1,000 mg Oral Q6H  . bisacodyl  10 mg Oral Daily  . enoxaparin (LOVENOX) injection  40 mg Subcutaneous Q24H  . fentaNYL   Intravenous Q4H  . hydrochlorothiazide  12.5 mg Oral Daily  . lisinopril  10 mg Oral Daily  . mometasone-formoterol  2 puff Inhalation BID  . pantoprazole  40 mg Oral Daily  . senna-docusate  1 tablet Oral QHS   Continuous Infusions: . bupivacaine ON-Q pain pump    . dextrose 5 % and 0.9 % NaCl with KCl 20 mEq/L 10 mL/hr at 05/05/15 2300   PRN Meds:.ALPRAZolam, diphenhydrAMINE **OR** diphenhydrAMINE, naloxone **AND** sodium chloride flush, ondansetron (ZOFRAN) IV, oxyCODONE, potassium chloride, traMADol  General appearance: alert, cooperative and no distress Heart: regular rate and rhythm Lungs: coarse  with scattered wheeze Abdomen: mod sistension, + BS Extremities: minor edema Wound: incis healing well  Lab Results: CBC: Recent Labs  05/04/15 0410 05/05/15 0340  WBC 12.4* 11.0*  HGB 12.7* 12.5*  HCT 37.4* 37.5*  PLT 179 178   BMET:  Recent Labs  05/04/15 0410 05/05/15 0340  NA 135 133*  K 3.7 3.4*  CL 100* 97*  CO2 25 27  GLUCOSE 119* 125*  BUN 7 <5*  CREATININE 0.96 1.00  CALCIUM 8.0* 8.3*    PT/INR: No results for input(s): LABPROT, INR in the last 72 hours. Radiology: No results found.   Assessment/Plan: S/P Procedure(s) (LRB): LEFT VIDEO ASSISTED THORACOSCOPY (VATS)/ LEFT LOWER LOBECTOMY, ON-Q CATHETER PLACEMENT (Left)   1 Steady progress 2 keep tube till result of CXR seen 3 no new labs 4 increase lisinopril to home dose for HTN 5 path pending 6 repeat BMET in am  GOLD,WAYNE E 05/06/2015 7:53 AM Patient seen and examined, agee with above CXR OK- still has some atelectasis, but otherwise unremarkable Dc chest tube, central line and PCA  Deshonda Cryderman C. Roxan Hockey, MD [facility] [phone]

## 2015-05-07 ENCOUNTER — Inpatient Hospital Stay (HOSPITAL_COMMUNITY): Payer: Self-pay

## 2015-05-07 LAB — BASIC METABOLIC PANEL
ANION GAP: 9 (ref 5–15)
BUN: 7 mg/dL (ref 6–20)
CALCIUM: 8.6 mg/dL — AB (ref 8.9–10.3)
CO2: 29 mmol/L (ref 22–32)
Chloride: 99 mmol/L — ABNORMAL LOW (ref 101–111)
Creatinine, Ser: 1.05 mg/dL (ref 0.61–1.24)
GFR calc Af Amer: >60 mL/min (ref >60)
GLUCOSE: 103 mg/dL — AB (ref 65–99)
POTASSIUM: 3.4 mmol/L — AB (ref 3.5–5.1)
Sodium: 137 mmol/L (ref 135–145)

## 2015-05-07 MED ORDER — OXYCODONE HCL 5 MG PO TABS: 5.0000 mg | ORAL_TABLET | ORAL | Status: DC | PRN

## 2015-05-07 NOTE — Care Management Note (Signed)
Case Management Note  Patient Details  Name: Fernando [name] MRN: [id number] Date of Birth: Jun 13, 1960  Subjective/Objective:  Patient is from home , pta indep.  NCM informed patient to call [facility] on Monday at 9 am when they open for a hospital follow up appt.  The only medication he has to get at discharge is the pain medication which he will get from [facility].  He has transport by his mother.  Patient has no other needs.                   Action/Plan:   Expected Discharge Date:                  Expected Discharge Plan:  Home/Self Care  In-House Referral:     Discharge planning Services  CM Consult, Medication Assistance, [facility]  Post Acute Care Choice:    Choice offered to:     DME Arranged:    DME Agency:     HH Arranged:    HH Agency:     Status of Service:  Completed, signed off  Medicare Important Message Given:    Date Medicare IM Given:    Medicare IM give by:    Date Additional Medicare IM Given:    Additional Medicare Important Message give by:     If discussed at [place] of Stay Meetings, dates discussed:    Additional Comments:  [name], RN 05/07/2015, 12:52 PM

## 2015-05-07 NOTE — Progress Notes (Signed)
4 Days Post-Op Procedure(s) (LRB): LEFT VIDEO ASSISTED THORACOSCOPY (VATS)/ LEFT LOWER LOBECTOMY, ON-Q CATHETER PLACEMENT (Left) Subjective: Some discomfort from where CT was removed yesterday  Objective: Vital signs in last 24 hours: Temp:  [97.4 F (36.3 C)-98.9 F (37.2 C)] 97.4 F (36.3 C) (04/28 0511) Pulse Rate:  [81-93] 81 (04/28 0017) Cardiac Rhythm:  [-] Normal sinus rhythm (04/27 2030) Resp:  [15-18] 17 (04/28 0017) BP: (113-117)/(73-74) 117/73 mmHg (04/28 0017) SpO2:  [96 %-99 %] 98 % (04/28 0017)  Hemodynamic parameters for last 24 hours:    Intake/Output from previous day: 04/27 0701 - 04/28 0700 In: 20 [I.V.:20] Out: 950 [Urine:950] Intake/Output this shift:    General appearance: alert, cooperative and no distress Neurologic: intact Heart: regular rate and rhythm Lungs: slightly diminished at left base Wound: clean and dry  Lab Results:  Recent Labs  05/05/15 0340  WBC 11.0*  HGB 12.5*  HCT 37.5*  PLT 178   BMET:  Recent Labs  05/05/15 0340 05/07/15 0549  NA 133* 137  K 3.4* 3.4*  CL 97* 99*  CO2 27 29  GLUCOSE 125* 103*  BUN <5* 7  CREATININE 1.00 1.05  CALCIUM 8.3* 8.6*    PT/INR: No results for input(s): LABPROT, INR in the last 72 hours. ABG    Component Value Date/Time   PHART 7.398 05/04/2015 0425   HCO3 26.9* 05/04/2015 0425   TCO2 28.3 05/04/2015 0425   ACIDBASEDEF 2.0 05/03/2015 0901   O2SAT 90.7 05/04/2015 0425   CBG (last 3)   Recent Labs  05/04/15 0839 05/04/15 1113  GLUCAP 120* 125*    Assessment/Plan: S/P Procedure(s) (LRB): LEFT VIDEO ASSISTED THORACOSCOPY (VATS)/ LEFT LOWER LOBECTOMY, ON-Q CATHETER PLACEMENT (Left) -  Looks good Will get CXR this AM Home later today if CXR OK   LOS: 4 days    Melrose Nakayama 05/07/2015

## 2015-05-07 NOTE — Progress Notes (Signed)
Patient called RN to the room to show a red, warm, firm area on right side. Talked with Jadene Pierini, PA about it and he suggests that patient apply neosporin to the area after showering. If patient develops fever or area gets worse for patient to call the office.

## 2015-05-07 NOTE — Progress Notes (Signed)
Discharge instructions reviewed with patient and patient's mother at the bedside. Rx given, all Rx's reviewed. Patient reports understanding of discharge instructions and importance of follow up appointments. PIV removed without any complications. Pt will be ready for discharge.

## 2015-05-10 ENCOUNTER — Other Ambulatory Visit: Payer: Self-pay | Admitting: *Deleted

## 2015-05-10 DIAGNOSIS — G8918 Other acute postprocedural pain: Secondary | ICD-10-CM

## 2015-05-10 MED ORDER — OXYCODONE HCL 5 MG PO TABS: 5.0000 mg | ORAL_TABLET | ORAL | Status: DC | PRN

## 2015-05-12 ENCOUNTER — Emergency Department (HOSPITAL_COMMUNITY): Payer: Medicaid Other

## 2015-05-12 ENCOUNTER — Encounter (HOSPITAL_COMMUNITY): Payer: Self-pay | Admitting: *Deleted

## 2015-05-12 ENCOUNTER — Inpatient Hospital Stay (HOSPITAL_COMMUNITY)
Admission: EM | Admit: 2015-05-12 | Discharge: 2015-05-14 | DRG: 195 | Disposition: A | Discharge status: Home / Self Care | Payer: Medicaid Other | Attending: Internal Medicine | Admitting: Internal Medicine

## 2015-05-12 DIAGNOSIS — R918 Other nonspecific abnormal finding of lung field: Secondary | ICD-10-CM

## 2015-05-12 DIAGNOSIS — R079 Chest pain, unspecified: Secondary | ICD-10-CM

## 2015-05-12 DIAGNOSIS — Z87891 Personal history of nicotine dependence: Secondary | ICD-10-CM

## 2015-05-12 DIAGNOSIS — K602 Anal fissure, unspecified: Secondary | ICD-10-CM | POA: Diagnosis present

## 2015-05-12 DIAGNOSIS — G8918 Other acute postprocedural pain: Secondary | ICD-10-CM | POA: Diagnosis present

## 2015-05-12 DIAGNOSIS — R05 Cough: Secondary | ICD-10-CM

## 2015-05-12 DIAGNOSIS — Z902 Acquired absence of lung [part of]: Secondary | ICD-10-CM

## 2015-05-12 DIAGNOSIS — Z803 Family history of malignant neoplasm of breast: Secondary | ICD-10-CM

## 2015-05-12 DIAGNOSIS — R058 Other specified cough: Secondary | ICD-10-CM

## 2015-05-12 DIAGNOSIS — I10 Essential (primary) hypertension: Secondary | ICD-10-CM | POA: Diagnosis present

## 2015-05-12 DIAGNOSIS — F419 Anxiety disorder, unspecified: Secondary | ICD-10-CM | POA: Diagnosis present

## 2015-05-12 DIAGNOSIS — J189 Pneumonia, unspecified organism: Principal | ICD-10-CM | POA: Diagnosis present

## 2015-05-12 DIAGNOSIS — B354 Tinea corporis: Secondary | ICD-10-CM | POA: Diagnosis present

## 2015-05-12 DIAGNOSIS — Y95 Nosocomial condition: Secondary | ICD-10-CM | POA: Diagnosis present

## 2015-05-12 HISTORY — DX: Pneumonia, unspecified organism: J18.9

## 2015-05-12 LAB — I-STAT CG4 LACTIC ACID, ED
LACTIC ACID, VENOUS: 1.06 mmol/L (ref 0.5–2.0)
Lactic Acid, Venous: 0.53 mmol/L (ref 0.5–2.0)

## 2015-05-12 LAB — HEPATIC FUNCTION PANEL
ALBUMIN: 3.3 g/dL — AB (ref 3.5–5.0)
ALT: 34 U/L (ref 17–63)
AST: 26 U/L (ref 15–41)
Alkaline Phosphatase: 88 U/L (ref 38–126)
Bilirubin, Direct: <0.1 mg/dL — ABNORMAL LOW (ref 0.1–0.5)
TOTAL PROTEIN: 7.2 g/dL (ref 6.5–8.1)
Total Bilirubin: 0.2 mg/dL — ABNORMAL LOW (ref 0.3–1.2)

## 2015-05-12 LAB — TROPONIN I

## 2015-05-12 LAB — CBC WITH DIFFERENTIAL/PLATELET
BASOS ABS: 0 10*3/uL (ref 0.0–0.1)
Basophils Relative: 0 %
EOS ABS: 0.6 10*3/uL (ref 0.0–0.7)
EOS PCT: 7 %
HCT: 36.5 % — ABNORMAL LOW (ref 39.0–52.0)
HEMOGLOBIN: 12.7 g/dL — AB (ref 13.0–17.0)
LYMPHS ABS: 1.9 10*3/uL (ref 0.7–4.0)
LYMPHS PCT: 21 %
MCH: 32.4 pg (ref 26.0–34.0)
MCHC: 34.8 g/dL (ref 30.0–36.0)
MCV: 93.1 fL (ref 78.0–100.0)
Monocytes Absolute: 0.9 10*3/uL (ref 0.1–1.0)
Monocytes Relative: 10 %
NEUTROS PCT: 62 %
Neutro Abs: 5.6 10*3/uL (ref 1.7–7.7)
PLATELETS: 369 10*3/uL (ref 150–400)
RBC: 3.92 MIL/uL — AB (ref 4.22–5.81)
RDW: 13.9 % (ref 11.5–15.5)
WBC: 9 10*3/uL (ref 4.0–10.5)

## 2015-05-12 LAB — BASIC METABOLIC PANEL
ANION GAP: 10 (ref 5–15)
BUN: 12 mg/dL (ref 6–20)
CHLORIDE: 99 mmol/L — AB (ref 101–111)
CO2: 26 mmol/L (ref 22–32)
Calcium: 8.8 mg/dL — ABNORMAL LOW (ref 8.9–10.3)
Creatinine, Ser: 1 mg/dL (ref 0.61–1.24)
GFR calc Af Amer: >60 mL/min (ref >60)
Glucose, Bld: 120 mg/dL — ABNORMAL HIGH (ref 65–99)
POTASSIUM: 3.9 mmol/L (ref 3.5–5.1)
SODIUM: 135 mmol/L (ref 135–145)

## 2015-05-12 LAB — BRAIN NATRIURETIC PEPTIDE: B NATRIURETIC PEPTIDE 5: 27 pg/mL (ref 0.0–100.0)

## 2015-05-12 LAB — LIPASE, BLOOD: LIPASE: 18 U/L (ref 11–51)

## 2015-05-12 MED ORDER — PIPERACILLIN-TAZOBACTAM 3.375 G IVPB 30 MIN
3.3750 g | Freq: Once | INTRAVENOUS | Status: AC
  Administered 2015-05-12: 3.375 g via INTRAVENOUS
  Filled 2015-05-12: qty 50

## 2015-05-12 MED ORDER — SODIUM CHLORIDE 0.9 % IV BOLUS (SEPSIS)
500.0000 mL | Freq: Once | INTRAVENOUS | Status: AC
  Administered 2015-05-12: 500 mL via INTRAVENOUS

## 2015-05-12 MED ORDER — HYDROMORPHONE HCL 2 MG/ML IJ SOLN
2.0000 mg | INTRAMUSCULAR | Status: DC | PRN
  Administered 2015-05-12 – 2015-05-13 (×3): 2 mg via INTRAVENOUS
  Filled 2015-05-12 (×2): qty 1

## 2015-05-12 MED ORDER — FENTANYL CITRATE (PF) 100 MCG/2ML IJ SOLN
50.0000 ug | Freq: Once | INTRAMUSCULAR | Status: AC
  Administered 2015-05-12: 50 ug via INTRAVENOUS
  Filled 2015-05-12: qty 2

## 2015-05-12 MED ORDER — VANCOMYCIN HCL IN DEXTROSE 1-5 GM/200ML-% IV SOLN
1000.0000 mg | Freq: Once | INTRAVENOUS | Status: AC
  Administered 2015-05-12: 1000 mg via INTRAVENOUS
  Filled 2015-05-12: qty 200

## 2015-05-12 MED ORDER — IOHEXOL 350 MG/ML SOLN
100.0000 mL | Freq: Once | INTRAVENOUS | Status: AC | PRN
  Administered 2015-05-12: 100 mL via INTRAVENOUS

## 2015-05-12 MED ORDER — SODIUM CHLORIDE 0.9 % IV BOLUS (SEPSIS)
1000.0000 mL | Freq: Once | INTRAVENOUS | Status: AC
  Administered 2015-05-12: 1000 mL via INTRAVENOUS

## 2015-05-12 MED ORDER — HYDROMORPHONE HCL 2 MG/ML IJ SOLN
INTRAMUSCULAR | Status: AC
  Administered 2015-05-13: 2 mg via INTRAVENOUS
  Filled 2015-05-12: qty 1

## 2015-05-12 NOTE — ED Notes (Signed)
Pt c/o sob and left side chest pain that is worse with coughing, chills that started last night,  reports that he had "half" his left lung removed last week at [facility] due to lung cancer,

## 2015-05-12 NOTE — ED Notes (Signed)
Confirmed with lab that blood cultures have been done

## 2015-05-12 NOTE — ED Provider Notes (Signed)
CSN: [id number]     Arrival date & time 05/12/15  1908 History   First MD Initiated Contact with Patient 05/12/15 1931     Chief Complaint  Patient presents with  . Shortness of Breath     (Consider location/radiation/quality/duration/timing/severity/associated sxs/prior Treatment) HPI Patient was discharge from [facility] after [facility] lobectomy on 4/28. He presents with increasing left-sided chest pain. States 7 at rest and then 8:15 when coughing or deep breathing. He's had persistent cough with yellow sputum production. He's had shaking chills. States that the oxycodone he is taking is not helping with the pain. He has epigastric pain which does not radiate. Denies any new lower extremity swelling or pain. No drainage from wound. Chest tube has been removed. Past Medical History  Diagnosis Date  . Hypertension 12/30/2011  . Allergy   . Peri-rectal abscess     multiple.   . Tobacco abuse   . Shortness of breath dyspnea     with exertion  . Pneumonia   . Umbilical hernia     has been repaired  . Loose stools   . Complication of anesthesia     woke up during colonoscopy  . Cancer ([place])     left lobe nodule  . Anal fissure    Past Surgical History  Procedure Laterality Date  . Cervical fusion    . Facial cosmetic surgery    . Knee arthroscopy    . Appendectomy    . Hernia repair    . Spine surgery      cervical  . Cystoscopy N/A 05/01/2013    Procedure: CYSTOSCOPY;  Surgeon: Marissa Nestle, MD;  Location: [facility];  Service: Urology;  Laterality: N/A;  . Incision and drainage abscess Right 05/01/2013    Procedure: INCISION AND DRAINAGE RIGHT SCROTAL ABSCESS;  Surgeon: Marissa Nestle, MD;  Location: [facility];  Service: Urology;  Laterality: Right;  . Video bronchoscopy with endobronchial ultrasound N/A 04/22/2015    Procedure: VIDEO BRONCHOSCOPY WITH ENDOBRONCHIAL ULTRASOUND;  Surgeon: Melrose Nakayama, MD;  Location: [facility];  Service: Thoracic;  Laterality: N/A;  .  Colonoscopy    . Incision and drainage perirectal abscess    . Video assisted thoracoscopy (vats)/ lobectomy Left 05/03/2015    Procedure: LEFT VIDEO ASSISTED THORACOSCOPY (VATS)/ LEFT LOWER LOBECTOMY, ON-Q CATHETER PLACEMENT;  Surgeon: Melrose Nakayama, MD;  Location: [facility];  Service: Thoracic;  Laterality: Left;   Family History  Problem Relation Age of Onset  . Breast cancer Mother    Social History  Substance Use Topics  . Smoking status: Former Smoker -- 1.50 packs/day for 15 years    Types: Cigarettes    Quit date: 03/12/2015  . Smokeless tobacco: Never Used  . Alcohol Use: No     Comment: occ    Review of Systems  Constitutional: Positive for chills and diaphoresis. Negative for fever.  Respiratory: Positive for cough and shortness of breath. Negative for wheezing.   Cardiovascular: Positive for chest pain. Negative for palpitations and leg swelling.  Gastrointestinal: Positive for abdominal pain. Negative for nausea, vomiting, diarrhea and constipation.  Musculoskeletal: Negative for myalgias, back pain and neck pain.  Skin: Positive for wound. Negative for rash.  Neurological: Negative for dizziness, weakness, light-headedness, numbness and headaches.  All other systems reviewed and are negative.     Allergies  Morphine and related  Home Medications   Prior to Admission medications   Medication Sig Start Date End Date Taking? Authorizing Provider  albuterol (PROVENTIL HFA;VENTOLIN HFA) 108 (90 Base) MCG/ACT inhaler Inhale 2 puffs into the lungs every 6 (six) hours as needed for wheezing or shortness of breath. 03/12/15  Yes Ripudeep Krystal Eaton, MD  ALPRAZolam Duanne Moron) 1 MG tablet Take 1 tablet (1 mg total) by mouth 3 (three) times daily as needed for anxiety. 03/12/15  Yes Ripudeep Krystal Eaton, MD  budesonide-formoterol (SYMBICORT) 80-4.5 MCG/ACT inhaler Inhale 2 puffs into the lungs 2 (two) times daily. 03/12/15  Yes Ripudeep Krystal Eaton, MD  lisinopril-hydrochlorothiazide  (PRINZIDE,ZESTORETIC) 20-12.5 MG tablet Take 1 tablet by mouth daily. 03/12/15  Yes Ripudeep Krystal Eaton, MD  oxyCODONE (OXY IR/ROXICODONE) 5 MG immediate release tablet Take 1-2 tablets (5-10 mg total) by mouth every 4 (four) hours as needed for severe pain. 05/10/15  Yes Rexene Alberts, MD   BP 103/74 mmHg  Pulse 80  Temp(Src) 98 F (36.7 C) (Oral)  Resp 20  Ht 6\' 2"  (1.88 m)  Wt 246 lb (111.585 kg)  BMI 31.57 kg/m2  SpO2 99% Physical Exam  Constitutional: He is oriented to person, place, and time. He appears well-developed and well-nourished. He appears distressed.  HENT:  Head: Normocephalic and atraumatic.  Mouth/Throat: Oropharynx is clear and moist. No oropharyngeal exudate.  Eyes: EOM are normal. Pupils are equal, round, and reactive to light.  Neck: Normal range of motion. Neck supple.  Cardiovascular: Normal rate and regular rhythm.  Exam reveals no gallop.   No murmur heard. Pulmonary/Chest: No respiratory distress. He has no wheezes. He has no rales.  Increased respiratory effort. Decreased breath sounds left base.  Abdominal: Soft. Bowel sounds are normal. He exhibits no distension and no mass. There is tenderness. There is no rebound and no guarding.  Patient surgical wounds in the left upper quadrant appear well healing. No evidence of redness, warmth or drainage.  Musculoskeletal: Normal range of motion. He exhibits no edema or tenderness.  No lower extremity asymmetry, tenderness or swelling.  Neurological: He is alert and oriented to person, place, and time.  Moving all extremities without deficit. Sensation is fully intact.  Skin: Skin is warm and dry. No rash noted. No erythema.  Psychiatric: He has a normal mood and affect. His behavior is normal.  Nursing note and vitals reviewed.   ED Course  Procedures (including critical care time) Labs Review Labs Reviewed  CBC WITH DIFFERENTIAL/PLATELET - Abnormal; Notable for the following:    RBC 3.92 (*)    Hemoglobin 12.7  (*)    HCT 36.5 (*)    All other components within normal limits  BASIC METABOLIC PANEL - Abnormal; Notable for the following:    Chloride 99 (*)    Glucose, Bld 120 (*)    Calcium 8.8 (*)    All other components within normal limits  HEPATIC FUNCTION PANEL - Abnormal; Notable for the following:    Albumin 3.3 (*)    Total Bilirubin 0.2 (*)    Bilirubin, Direct <0.1 (*)    All other components within normal limits  CULTURE, BLOOD (ROUTINE X 2)  CULTURE, BLOOD (ROUTINE X 2)  TROPONIN I  LIPASE, BLOOD  BRAIN NATRIURETIC PEPTIDE  I-STAT CG4 LACTIC ACID, ED    Imaging Review Ct Angio Chest Pe W/cm &/or Wo Cm  05/12/2015  CLINICAL DATA:  Left-sided chest pain, shortness breath, and cough beginning last night. Recent left lower lobectomy for lung carcinoma. EXAM: CT ANGIOGRAPHY CHEST WITH CONTRAST TECHNIQUE: Multidetector CT imaging of the chest was performed using the standard protocol  during bolus administration of intravenous contrast. Multiplanar CT image reconstructions and MIPs were obtained to evaluate the vascular anatomy. CONTRAST:  118mL OMNIPAQUE IOHEXOL 350 MG/ML SOLN COMPARISON:  03/10/2015 FINDINGS: Mediastinum/Lymph Nodes: No pulmonary emboli or thoracic aortic dissection identified. No evidence of cardiomegaly or pericardial effusion. No masses or pathologically enlarged lymph nodes identified. Lungs/Pleura: Patient has undergone left lower lobectomy since previous study. Small left pleural effusion is seen without evidence pneumothorax. There is also scattered areas of patchy airspace disease in the left upper lobe which may be due to postop contusion, atelectasis, or pneumonia. Right lung remains clear. Upper abdomen: No acute findings. Musculoskeletal: No chest wall mass or suspicious bone lesions identified. Review of the MIP images confirms the above findings. IMPRESSION: No evidence of pulmonary embolism. Postop changes from left lower lobectomy. Small left pleural effusion.  Patchy left upper lobe airspace disease which may represent postop contusion, atelectasis, or pneumonia. Electronically Signed   By: Earle Gell M.D.   On: 05/12/2015 20:43   Dg Chest Portable 1 View  05/12/2015  CLINICAL DATA:  Status post VATS and left lobectomy 05/03/2015. Severe cough and left-sided chest pain since last night. EXAM: PORTABLE CHEST 1 VIEW COMPARISON:  Chest x-rays dated 05/07/2015 and 05/06/2015. FINDINGS: Cardiomediastinal silhouette appears stable in size and configuration. There is now central pulmonary vascular congestion and bilateral interstitial edema, left greater than right. There is also a new dense opacity at the left lung base, most likely a combination of atelectasis and small effusion. No pneumothorax seen. IMPRESSION: 1. New dense opacity at the left lung base, most likely a combination of atelectasis and small pleural effusion. 2. Central pulmonary vascular congestion and bilateral interstitial edema, left greater than right, suggesting volume overload/CHF. 3. No pneumothorax. Electronically Signed   By: Franki Cabot M.D.   On: 05/12/2015 19:39   I have personally reviewed and evaluated these images and lab results as part of my medical decision-making.   EKG Interpretation None      MDM   Final diagnoses:  Chest pain, unspecified chest pain type  Productive cough  Pulmonary infiltrate in left lung on chest x-ray    Patient is more comfortable after fentanyl. Blood pressure has improved. Questionable infiltrate on chest x-ray and on CT. Given productive cough and recent hospitalization covered broadly with antibiotics. Discuss with Dr. Darrick Meigs. Will admit for observation bed.    Julianne Rice, MD 05/16/15 1057

## 2015-05-12 NOTE — H&P (Signed)
TRH H&P   Patient Demographics:    Fernando [name], is a 55 y.o. male  MRN: [id number]   DOB - 1960/02/06  Admit Date - 05/12/2015  Outpatient Primary MD for the patient is Collene Mares, PA-C  Referring MD/NP/PA: Dr. Lita Mains  Outpatient Specialists: Dr. Roxan Hockey  Patient coming from: Home  Chief Complaint  Patient presents with  . Shortness of Breath      HPI:    Fernando [name]  is a 55 y.o. male, With a history of CAD, hypertension, multiple perirectal/scrotal abscesses, who recently underwent VATS, left lower lobectomy, chest tube placement which was removed on 05/06/2015 for  left lung nodule.  Biopsy results showed benign lesions. Patient is scheduled to follow-up CT surgeon Dr. Roxan Hockey  As  Outpatient.  As per patient after he was discharged from the hospital, he was having hemoptysis which gradually improved. At this time he is not coughing up any blood.   patient was discharged on oxycodone for pain management , and as per patient oxycodone was helpful initially but since yesterday the pain has become worse. He also noticed shortness of breath on exertion. The pain is worse on coughing.  . Denies nausea vomiting or diarrhea.   in the ED CT angiogram of the chest was done which was negative for pulmonary embolism. It showed postop changes from lower left lobectomy also showed patchy upper lobe airspace disease which could be postop contusion, atelectasis or pneumonia. Patient empirically started on vancomycin and Zosyn for possible pneumonia. Lactic acid 1.6. Patient has been afebrile. White count normal.    Review of systems:    In addition to the HPI above,  No Fever-chills, but has been having hot and cold flashes.  No Headache, No changes with Vision or hearing, No problems swallowing food or Liquids,  No Abdominal pain, No Nausea or Vommitting, Bowel movements are regular, No  Blood in stool or Urine, No dysuria, Complains of rash in the right flank region  No new joints pains-aches,  No new weakness, tingling, numbness in any extremity, No recent weight gain or loss, No polyuria, polydypsia or polyphagia, No significant Mental Stressors.  A full 10 point Review of Systems was done, except as stated above, all other Review of Systems were negative.   With Past History of the following :    Past Medical History  Diagnosis Date  . Hypertension 12/30/2011  . Allergy   . Peri-rectal abscess     multiple.   . Tobacco abuse   . Shortness of breath dyspnea     with exertion  . Pneumonia   . Umbilical hernia     has been repaired  . Loose stools   . Complication of anesthesia     woke up during colonoscopy  . Cancer ([place])     left lobe nodule  . Anal fissure       Past Surgical History  Procedure Laterality Date  . Cervical fusion    . Facial cosmetic surgery    .  Knee arthroscopy    . Appendectomy    . Hernia repair    . Spine surgery      cervical  . Cystoscopy N/A 05/01/2013    Procedure: CYSTOSCOPY;  Surgeon: Marissa Nestle, MD;  Location: [facility];  Service: Urology;  Laterality: N/A;  . Incision and drainage abscess Right 05/01/2013    Procedure: INCISION AND DRAINAGE RIGHT SCROTAL ABSCESS;  Surgeon: Marissa Nestle, MD;  Location: [facility];  Service: Urology;  Laterality: Right;  . Video bronchoscopy with endobronchial ultrasound N/A 04/22/2015    Procedure: VIDEO BRONCHOSCOPY WITH ENDOBRONCHIAL ULTRASOUND;  Surgeon: Melrose Nakayama, MD;  Location: [facility];  Service: Thoracic;  Laterality: N/A;  . Colonoscopy    . Incision and drainage perirectal abscess    . Video assisted thoracoscopy (vats)/ lobectomy Left 05/03/2015    Procedure: LEFT VIDEO ASSISTED THORACOSCOPY (VATS)/ LEFT LOWER LOBECTOMY, ON-Q CATHETER PLACEMENT;  Surgeon: Melrose Nakayama, MD;  Location: [facility];  Service: Thoracic;  Laterality: Left;      Social History:       Social History  Substance Use Topics  . Smoking status: Former Smoker -- 1.50 packs/day for 15 years    Types: Cigarettes    Quit date: 03/12/2015  . Smokeless tobacco: Never Used  . Alcohol Use: No     Comment: occ     Lives - At home   Mobility - Fully mobile without any assistive devices      Family History :     Family History  Problem Relation Age of Onset  . Breast cancer Mother    Mother sister died of breast cancer    Home Medications:   Prior to Admission medications   Medication Sig Start Date End Date Taking? Authorizing Provider  albuterol (PROVENTIL HFA;VENTOLIN HFA) 108 (90 Base) MCG/ACT inhaler Inhale 2 puffs into the lungs every 6 (six) hours as needed for wheezing or shortness of breath. 03/12/15  Yes Ripudeep Krystal Eaton, MD  ALPRAZolam Duanne Moron) 1 MG tablet Take 1 tablet (1 mg total) by mouth 3 (three) times daily as needed for anxiety. 03/12/15  Yes Ripudeep Krystal Eaton, MD  budesonide-formoterol (SYMBICORT) 80-4.5 MCG/ACT inhaler Inhale 2 puffs into the lungs 2 (two) times daily. 03/12/15  Yes Ripudeep Krystal Eaton, MD  lisinopril-hydrochlorothiazide (PRINZIDE,ZESTORETIC) 20-12.5 MG tablet Take 1 tablet by mouth daily. 03/12/15  Yes Ripudeep Krystal Eaton, MD  oxyCODONE (OXY IR/ROXICODONE) 5 MG immediate release tablet Take 1-2 tablets (5-10 mg total) by mouth every 4 (four) hours as needed for severe pain. 05/10/15  Yes Rexene Alberts, MD     Allergies:     Allergies  Allergen Reactions  . Morphine And Related Other (See Comments)    hallucinations     Physical Exam:   Vitals  Blood pressure 102/70, pulse 80, temperature 98 F (36.7 C), temperature source Oral, resp. rate 29, height 6\' 2"  (1.88 m), weight 111.585 kg (246 lb), SpO2 99 %.   1. General  male lying in bed in NAD, cooperative with exam   2. Normal affect and insight, Not Suicidal or Homicidal, Awake Alert, Oriented X 3.  3. No F.N deficits, ALL C.Nerves Intact, Strength 5/5 all 4 extremities, Sensation  intact all 4 extremities, Plantars down going.  4. Ears and Eyes appear Normal, Conjunctivae clear, PERRLA. Moist Oral Mucosa.  5. Supple Neck, No JVD, No cervical lymphadenopathy appriciated, No Carotid Bruits.  6. Symmetrical Chest wall movement, Good air movement bilaterally, bilateral wheezing  7. RRR, No Gallops, Rubs or Murmurs, No Parasternal Heave.  8. Positive Bowel Sounds, Abdomen Soft, No tenderness, No organomegaly appriciated,No rebound -guarding or rigidity.  9.  No Cyanosis, Normal Skin Turgor, erythematous rash noted in the right lower abdominal wall extending from the right flank to midline  10. Good muscle tone,  joints appear normal , no effusions, Normal ROM.      Data Review:    CBC  Recent Labs Lab 05/12/15 1916  WBC 9.0  HGB 12.7*  HCT 36.5*  PLT 369  MCV 93.1  MCH 32.4  MCHC 34.8  RDW 13.9  LYMPHSABS 1.9  MONOABS 0.9  EOSABS 0.6  BASOSABS 0.0   ------------------------------------------------------------------------------------------------------------------  Chemistries   Recent Labs Lab 05/07/15 0549 05/12/15 1916  NA 137 135  K 3.4* 3.9  CL 99* 99*  CO2 29 26  GLUCOSE 103* 120*  BUN 7 12  CREATININE 1.05 1.00  CALCIUM 8.6* 8.8*  AST  --  26  ALT  --  34  ALKPHOS  --  88  BILITOT  --  0.2*   ------------------------------------------------------------------------------------------------------------------ estimated creatinine clearance is 113.6 mL/min (by C-G formula based on Cr of 1). ------------------------------------------------------------------------------------------------------------------ No results for input(s): TSH, T4TOTAL, T3FREE, THYROIDAB in the last 72 hours.  Invalid input(s): FREET3  Coagulation profile No results for input(s): INR, PROTIME in the last 168 hours. ------------------------------------------------------------------------------------------------------------------- No results for input(s):  DDIMER in the last 72 hours. -------------------------------------------------------------------------------------------------------------------  Cardiac Enzymes  Recent Labs Lab 05/12/15 1916  TROPONINI <0.03   ------------------------------------------------------------------------------------------------------------------    Component Value Date/Time   BNP 27.0 05/12/2015 1916     ---------------------------------------------------------------------------------------------------------------  Urinalysis    Component Value Date/Time   COLORURINE YELLOW 04/30/2015 0911   APPEARANCEUR CLEAR 04/30/2015 0911   LABSPEC 1.016 04/30/2015 0911   PHURINE 6.0 04/30/2015 0911   GLUCOSEU NEGATIVE 04/30/2015 0911   HGBUR NEGATIVE 04/30/2015 0911   BILIRUBINUR NEGATIVE 04/30/2015 0911   KETONESUR NEGATIVE 04/30/2015 0911   PROTEINUR NEGATIVE 04/30/2015 0911   UROBILINOGEN 0.2 08/15/2009 0510   NITRITE NEGATIVE 04/30/2015 0911   LEUKOCYTESUR NEGATIVE 04/30/2015 0911    ----------------------------------------------------------------------------------------------------------------   Imaging Results:    Ct Angio Chest Pe W/cm &/or Wo Cm  05/12/2015  CLINICAL DATA:  Left-sided chest pain, shortness breath, and cough beginning last night. Recent left lower lobectomy for lung carcinoma. EXAM: CT ANGIOGRAPHY CHEST WITH CONTRAST TECHNIQUE: Multidetector CT imaging of the chest was performed using the standard protocol during bolus administration of intravenous contrast. Multiplanar CT image reconstructions and MIPs were obtained to evaluate the vascular anatomy. CONTRAST:  160mL OMNIPAQUE IOHEXOL 350 MG/ML SOLN COMPARISON:  03/10/2015 FINDINGS: Mediastinum/Lymph Nodes: No pulmonary emboli or thoracic aortic dissection identified. No evidence of cardiomegaly or pericardial effusion. No masses or pathologically enlarged lymph nodes identified. Lungs/Pleura: Patient has undergone left lower lobectomy  since previous study. Small left pleural effusion is seen without evidence pneumothorax. There is also scattered areas of patchy airspace disease in the left upper lobe which may be due to postop contusion, atelectasis, or pneumonia. Right lung remains clear. Upper abdomen: No acute findings. Musculoskeletal: No chest wall mass or suspicious bone lesions identified. Review of the MIP images confirms the above findings. IMPRESSION: No evidence of pulmonary embolism. Postop changes from left lower lobectomy. Small left pleural effusion. Patchy left upper lobe airspace disease which may represent postop contusion, atelectasis, or pneumonia. Electronically Signed   By: Earle Gell M.D.   On: 05/12/2015 20:43   Dg Chest Portable 1  View  05/12/2015  CLINICAL DATA:  Status post VATS and left lobectomy 05/03/2015. Severe cough and left-sided chest pain since last night. EXAM: PORTABLE CHEST 1 VIEW COMPARISON:  Chest x-rays dated 05/07/2015 and 05/06/2015. FINDINGS: Cardiomediastinal silhouette appears stable in size and configuration. There is now central pulmonary vascular congestion and bilateral interstitial edema, left greater than right. There is also a new dense opacity at the left lung base, most likely a combination of atelectasis and small effusion. No pneumothorax seen. IMPRESSION: 1. New dense opacity at the left lung base, most likely a combination of atelectasis and small pleural effusion. 2. Central pulmonary vascular congestion and bilateral interstitial edema, left greater than right, suggesting volume overload/CHF. 3. No pneumothorax. Electronically Signed   By: Franki Cabot M.D.   On: 05/12/2015 19:39    My personal review of EKG: Rhythm NSR   Assessment & Plan:    Active Problems:   Chest pain   HCAP (healthcare-associated pneumonia)     1. Healthcare associated pneumonia - patient empirically started on vancomycin and Zosyn for possible H CAP. Follow blood culture results.  2.   Postoperative pain - patient has been experiencing was number of postoperative pain since discharge from the hospital. Will start Dilaudid 2 mg IV every 4 hours when necessary for pain.  3. Hypertension - blood pressure has been on the low side, but hold Zestoretic at this time.  4.  Tinea corporis- we'll start clotrimazole cream twice a day.    DVT Prophylaxis-   SCDs   AM Labs Ordered, also please review Full Orders  Family Communication: No family at bedside  Code StatusFull code   Likely DC to   Home    Admission status: Observation  Time spent in minutes : 55 minutes   LAMA,GAGAN S M.D on 05/12/2015 at 10:46 PM  Between 7am to 7pm - Pager - [phone]. After 7pm go to [email or web address] - password [facility]  [facility] - Office  [phone]

## 2015-05-13 ENCOUNTER — Encounter (HOSPITAL_COMMUNITY): Payer: Self-pay | Admitting: *Deleted

## 2015-05-13 DIAGNOSIS — G8918 Other acute postprocedural pain: Secondary | ICD-10-CM | POA: Diagnosis present

## 2015-05-13 DIAGNOSIS — B354 Tinea corporis: Secondary | ICD-10-CM | POA: Diagnosis present

## 2015-05-13 DIAGNOSIS — Z803 Family history of malignant neoplasm of breast: Secondary | ICD-10-CM | POA: Diagnosis not present

## 2015-05-13 DIAGNOSIS — F419 Anxiety disorder, unspecified: Secondary | ICD-10-CM | POA: Diagnosis present

## 2015-05-13 DIAGNOSIS — I10 Essential (primary) hypertension: Secondary | ICD-10-CM | POA: Diagnosis present

## 2015-05-13 DIAGNOSIS — Y95 Nosocomial condition: Secondary | ICD-10-CM | POA: Diagnosis present

## 2015-05-13 DIAGNOSIS — J189 Pneumonia, unspecified organism: Secondary | ICD-10-CM | POA: Diagnosis present

## 2015-05-13 DIAGNOSIS — R05 Cough: Secondary | ICD-10-CM | POA: Diagnosis present

## 2015-05-13 DIAGNOSIS — Z87891 Personal history of nicotine dependence: Secondary | ICD-10-CM | POA: Diagnosis not present

## 2015-05-13 DIAGNOSIS — R079 Chest pain, unspecified: Secondary | ICD-10-CM | POA: Insufficient documentation

## 2015-05-13 DIAGNOSIS — Z902 Acquired absence of lung [part of]: Secondary | ICD-10-CM | POA: Diagnosis not present

## 2015-05-13 DIAGNOSIS — K602 Anal fissure, unspecified: Secondary | ICD-10-CM | POA: Diagnosis present

## 2015-05-13 LAB — CBC
HCT: 34.2 % — ABNORMAL LOW (ref 39.0–52.0)
HEMOGLOBIN: 11.5 g/dL — AB (ref 13.0–17.0)
MCH: 31.9 pg (ref 26.0–34.0)
MCHC: 33.6 g/dL (ref 30.0–36.0)
MCV: 95 fL (ref 78.0–100.0)
Platelets: 341 10*3/uL (ref 150–400)
RBC: 3.6 MIL/uL — AB (ref 4.22–5.81)
RDW: 14 % (ref 11.5–15.5)
WBC: 8.1 10*3/uL (ref 4.0–10.5)

## 2015-05-13 LAB — COMPREHENSIVE METABOLIC PANEL
ALBUMIN: 3 g/dL — AB (ref 3.5–5.0)
ALK PHOS: 75 U/L (ref 38–126)
ALT: 29 U/L (ref 17–63)
ANION GAP: 8 (ref 5–15)
AST: 23 U/L (ref 15–41)
BUN: 13 mg/dL (ref 6–20)
CALCIUM: 8.4 mg/dL — AB (ref 8.9–10.3)
CO2: 29 mmol/L (ref 22–32)
Chloride: 100 mmol/L — ABNORMAL LOW (ref 101–111)
Creatinine, Ser: 1.04 mg/dL (ref 0.61–1.24)
GFR calc Af Amer: >60 mL/min (ref >60)
GFR calc non Af Amer: >60 mL/min (ref >60)
GLUCOSE: 101 mg/dL — AB (ref 65–99)
POTASSIUM: 3.5 mmol/L (ref 3.5–5.1)
SODIUM: 137 mmol/L (ref 135–145)
Total Bilirubin: 0.3 mg/dL (ref 0.3–1.2)
Total Protein: 6.8 g/dL (ref 6.5–8.1)

## 2015-05-13 LAB — TROPONIN I: Troponin I: <0.03 ng/mL (ref <0.031)

## 2015-05-13 MED ORDER — PIPERACILLIN-TAZOBACTAM 3.375 G IVPB
3.3750 g | Freq: Once | INTRAVENOUS | Status: AC
  Administered 2015-05-13: 3.375 g via INTRAVENOUS
  Filled 2015-05-13: qty 50

## 2015-05-13 MED ORDER — VANCOMYCIN HCL 10 G IV SOLR
1500.0000 mg | Freq: Once | INTRAVENOUS | Status: AC
  Administered 2015-05-13: 1500 mg via INTRAVENOUS
  Filled 2015-05-13: qty 1500

## 2015-05-13 MED ORDER — PIPERACILLIN-TAZOBACTAM 3.375 G IVPB
3.3750 g | Freq: Three times a day (TID) | INTRAVENOUS | Status: DC
  Administered 2015-05-13 – 2015-05-14 (×3): 3.375 g via INTRAVENOUS
  Filled 2015-05-13 (×3): qty 50

## 2015-05-13 MED ORDER — BENZONATATE 100 MG PO CAPS: 200.0000 mg | ORAL_CAPSULE | Freq: Three times a day (TID) | ORAL | Status: DC | PRN

## 2015-05-13 MED ORDER — VANCOMYCIN HCL IN DEXTROSE 1-5 GM/200ML-% IV SOLN: 1000.0000 mg | Freq: Three times a day (TID) | INTRAVENOUS | Status: DC

## 2015-05-13 MED ORDER — OXYCODONE HCL 5 MG PO TABS
15.0000 mg | ORAL_TABLET | ORAL | Status: DC | PRN
  Administered 2015-05-13 – 2015-05-14 (×6): 15 mg via ORAL
  Filled 2015-05-13 (×6): qty 3

## 2015-05-13 MED ORDER — ONDANSETRON HCL 4 MG/2ML IJ SOLN: 4.0000 mg | Freq: Four times a day (QID) | INTRAMUSCULAR | Status: DC | PRN

## 2015-05-13 MED ORDER — SODIUM CHLORIDE 0.9 % IV SOLN
INTRAVENOUS | Status: DC
  Administered 2015-05-13: 02:00:00 via INTRAVENOUS

## 2015-05-13 MED ORDER — ENOXAPARIN SODIUM 60 MG/0.6ML ~~LOC~~ SOLN
60.0000 mg | SUBCUTANEOUS | Status: DC
  Administered 2015-05-13: 60 mg via SUBCUTANEOUS
  Filled 2015-05-13: qty 0.6

## 2015-05-13 MED ORDER — MOMETASONE FURO-FORMOTEROL FUM 100-5 MCG/ACT IN AERO
2.0000 | INHALATION_SPRAY | Freq: Two times a day (BID) | RESPIRATORY_TRACT | Status: DC
  Administered 2015-05-13 – 2015-05-14 (×3): 2 via RESPIRATORY_TRACT
  Filled 2015-05-13: qty 8.8

## 2015-05-13 MED ORDER — ACETAMINOPHEN 325 MG PO TABS: 650.0000 mg | ORAL_TABLET | Freq: Four times a day (QID) | ORAL | Status: DC | PRN

## 2015-05-13 MED ORDER — ACETAMINOPHEN 650 MG RE SUPP: 650.0000 mg | Freq: Four times a day (QID) | RECTAL | Status: DC | PRN

## 2015-05-13 MED ORDER — HYDROCOD POLST-CPM POLST ER 10-8 MG/5ML PO SUER
5.0000 mL | Freq: Two times a day (BID) | ORAL | Status: DC
  Administered 2015-05-13 (×2): 5 mL via ORAL
  Filled 2015-05-13 (×2): qty 5

## 2015-05-13 MED ORDER — CLOTRIMAZOLE 1 % EX CREA
TOPICAL_CREAM | Freq: Two times a day (BID) | CUTANEOUS | Status: DC
  Administered 2015-05-13: 23:00:00 via TOPICAL
  Filled 2015-05-13: qty 15

## 2015-05-13 MED ORDER — ONDANSETRON HCL 4 MG PO TABS: 4.0000 mg | ORAL_TABLET | Freq: Four times a day (QID) | ORAL | Status: DC | PRN

## 2015-05-13 MED ORDER — ALPRAZOLAM 1 MG PO TABS
1.0000 mg | ORAL_TABLET | Freq: Three times a day (TID) | ORAL | Status: DC | PRN
Start: 2015-05-13 — End: 2015-05-14
  Administered 2015-05-13 – 2015-05-14 (×4): 1 mg via ORAL
  Filled 2015-05-13 (×5): qty 1

## 2015-05-13 MED ORDER — LEVALBUTEROL HCL 0.63 MG/3ML IN NEBU
0.6300 mg | INHALATION_SOLUTION | Freq: Four times a day (QID) | RESPIRATORY_TRACT | Status: DC
  Administered 2015-05-13 – 2015-05-14 (×4): 0.63 mg via RESPIRATORY_TRACT
  Filled 2015-05-13 (×4): qty 3

## 2015-05-13 MED ORDER — IPRATROPIUM BROMIDE 0.02 % IN SOLN
0.5000 mg | Freq: Four times a day (QID) | RESPIRATORY_TRACT | Status: DC
  Administered 2015-05-13 – 2015-05-14 (×4): 0.5 mg via RESPIRATORY_TRACT
  Filled 2015-05-13 (×4): qty 2.5

## 2015-05-13 MED ORDER — ALPRAZOLAM 0.5 MG PO TABS
ORAL_TABLET | ORAL | Status: AC
  Administered 2015-05-13: 1 mg via ORAL
  Filled 2015-05-13: qty 2

## 2015-05-13 MED ORDER — HYDROMORPHONE HCL 1 MG/ML IJ SOLN
1.0000 mg | INTRAMUSCULAR | Status: DC | PRN
  Administered 2015-05-13 – 2015-05-14 (×4): 1 mg via INTRAVENOUS
  Filled 2015-05-13 (×4): qty 1

## 2015-05-13 MED ORDER — POLYETHYLENE GLYCOL 3350 17 G PO PACK
17.0000 g | PACK | Freq: Every day | ORAL | Status: DC
  Administered 2015-05-13: 17 g via ORAL
  Filled 2015-05-13: qty 1

## 2015-05-13 NOTE — Care Management Note (Signed)
Case Management Note  Patient Details  Name: Fernando [name] MRN: [id number] Date of Birth: 1960/02/13  Subjective/Objective:                  Pt is from home, lives with his mother. Pt is ind with ADL's. Pt recently underwent lobectomy. Pt has no HH services or DME PTA. Pt is uninsured but has medicaid pending. Pt has f/u care for his fissure and Ca treatment.   Action/Plan: May need home O2 assessment, may need med assist. Will cont to follow for DC planning.   Expected Discharge Date:   05/15/2015               Expected Discharge Plan:  Home/Self Care  In-House Referral:  NA  Discharge planning Services  CM Consult  Post Acute Care Choice:  NA Choice offered to:  NA  DME Arranged:    DME Agency:     HH Arranged:    HH Agency:     Status of Service:  In process, will continue to follow  Medicare Important Message Given:    Date Medicare IM Given:    Medicare IM give by:    Date Additional Medicare IM Given:    Additional Medicare Important Message give by:     If discussed at [place] of Stay Meetings, dates discussed:    Additional Comments:  Sherald Barge, RN 05/13/2015, 9:45 AM

## 2015-05-13 NOTE — Progress Notes (Signed)
ANTIBIOTIC CONSULT NOTE-Preliminary  Pharmacy Consult for vancomycin and Zosyn Indication: Pneumonia  Allergies  Allergen Reactions  . Morphine And Related Other (See Comments)    hallucinations    Patient Measurements: Height: 6\' 2"  (188 cm) Weight: 251 lb 14.4 oz (114.261 kg) IBW/kg (Calculated) : 82.2  Vital Signs: Temp: 99.2 F (37.3 C) (05/04 0131) Temp Source: Oral (05/04 0131) BP: 97/60 mmHg (05/04 0131) Pulse Rate: 79 (05/04 0131)  Labs:  Recent Labs  05/12/15 1916  WBC 9.0  HGB 12.7*  PLT 369  CREATININE 1.00    Estimated Creatinine Clearance: 114.8 mL/min (by C-G formula based on Cr of 1).  No results for input(s): VANCOTROUGH, VANCOPEAK, VANCORANDOM, GENTTROUGH, GENTPEAK, GENTRANDOM, TOBRATROUGH, TOBRAPEAK, TOBRARND, AMIKACINPEAK, AMIKACINTROU, AMIKACIN in the last 72 hours.   Microbiology: Recent Results (from the past 720 hour(s))  Tissue culture     Status: None   Collection Time: 04/22/15 11:56 AM  Result Value Ref Range Status   Specimen Description TISSUE LEFT LUNG  Final   Special Requests NONE  Final   Gram Stain   Final    RARE WBC PRESENT, PREDOMINANTLY MONONUCLEAR NO ORGANISMS SEEN Performed at [facility]    Culture   Final    NO GROWTH 3 DAYS Performed at [facility]    Report Status 04/26/2015 FINAL  Final  Fungus Culture With Stain (Not @ [facility])     Status: None (Preliminary result)   Collection Time: 04/22/15 11:56 AM  Result Value Ref Range Status   Fungus Stain Final report  Final    Comment: (NOTE) Performed At: [facility] [place], [place] [id number] Lindon Romp MD [id number]    Fungus (Mycology) Culture PENDING  Incomplete   Fungal Source TISSUE  Final    Comment: LEFT LUNG   Acid Fast Smear (AFB)     Status: None   Collection Time: 04/22/15 11:56 AM  Result Value Ref Range Status   AFB Specimen Processing Concentration  Final   Acid Fast Smear Negative  Final   Comment: (NOTE) Performed At: [facility] [place], [place] [id number] Lindon Romp MD [id number]    Source (AFB) TISSUE  Final    Comment: LEFT LUNG   Fungus Culture Result     Status: None   Collection Time: 04/22/15 11:56 AM  Result Value Ref Range Status   Result 1 Comment  Final    Comment: (NOTE) KOH/Calcofluor preparation:  no fungus observed. Performed At: [facility] [place], [place] [id number] Lindon Romp MD [id number]   Surgical pcr screen     Status: Abnormal   Collection Time: 04/30/15  9:00 AM  Result Value Ref Range Status   MRSA, PCR NEGATIVE NEGATIVE Final   Staphylococcus aureus POSITIVE (A) NEGATIVE Final    Comment:        The Xpert SA Assay (FDA approved for NASAL specimens in patients over 2 years of age), is one component of a comprehensive surveillance program.  Test performance has been validated by [facility] for patients greater than or equal to 71 year old. It is not intended to diagnose infection nor to guide or monitor treatment.   Blood culture (routine x 2)     Status: None (Preliminary result)   Collection Time: 05/12/15  9:05 PM  Result Value Ref Range Status   Specimen Description BLOOD RIGHT ARM  Final   Special Requests BOTTLES DRAWN AEROBIC AND ANAEROBIC  Final   Culture PENDING  Incomplete   Report Status PENDING  Incomplete  Blood culture (routine x 2)     Status: None (Preliminary result)   Collection Time: 05/12/15  9:08 PM  Result Value Ref Range Status   Specimen Description BLOOD RIGHT HAND  Final   Special Requests BOTTLES DRAWN AEROBIC AND ANAEROBIC 6CC  Final   Culture PENDING  Incomplete   Report Status PENDING  Incomplete    Medical History: Past Medical History  Diagnosis Date  . Hypertension 12/30/2011  . Allergy   . Peri-rectal abscess     multiple.   . Tobacco abuse   . Shortness of breath dyspnea     with exertion  . Pneumonia   .  Umbilical hernia     has been repaired  . Loose stools   . Complication of anesthesia     woke up during colonoscopy  . Cancer ([place])     left lobe nodule  . Anal fissure     Medications:  Vancomycin 1000 mg x 1 dose given in the ED 05/12/15 Zosyn 3.375 Gm IV x 1 dose given in the ED 05/12/15  Assessment: 55 yo male seen in the ED s/p recent VATS, LL lobectomy, chest tube removal 4/27; now with improving hemoptysis but increased SOB on exertion and increased pain. Chest CT showed patchy airspace disease and possible post-op contusion, atelectasis or pneumonia. Pt is afebrile with normal WBCs. Empiric antibiotics for pneumonia.  Goal of Therapy:  Vancomycin troughs 15-20 mcg/ml Eradicate infection  Plan:  Preliminary review of pertinent patient information completed.  Protocol will be initiated with one-time doses of Vancomycin 1500 mg IV in addition to the 1000 mg dose given in the ED for a total loading dose of 2500 mg, and Zosyn 3.375 Gm IV 8 hours after the initial ED dose.  Forestine Na clinical pharmacist will complete review during morning rounds to assess patient and finalize treatment regimen.  Norberto Sorenson, [facility] 05/13/2015,2:29 AM

## 2015-05-13 NOTE — Progress Notes (Signed)
PROGRESS NOTE        PATIENT DETAILS Name: Fernando [name] Age: 55 y.o. Sex: male Date of Birth: 04-07-1960 Admit Date: 05/12/2015 Admitting Physician Oswald Hillock, MD [id number], Marland Kitchen, PA-C Outpatient Specialists: Dr. Dorris Carnes, Dr. Roxan Hockey  Brief Narrative: Patient is a 56 y.o. male who recently underwent left sided VATS, mediastinal lymph node dissection and left lower lobectomy by Dr. Roxan Hockey on 05/03/15, presented to the hospital with worsening of left-sided postoperative pain and cough. CT chest on admission negative for pulmonary embolism, but suggestive of possible healthcare associated pneumonia. Initial EKG and troponins were negative. Please see below for further details  Subjective: Continues to have left sided chest pain-mostly situated in the left lower chest near his incision site. Pain is pleuritic and also reproducible on palpation.  Assessment/Plan: Active Problems: Chest pain:is atypical for CAD-more consistent with worsening postoperative pain form worsening cough. Troponins and EKG are negative. CTA chest negative for pulmonary embolism or dissection. per patient, this is a similar pain he's been having since discharge from the hospital post surgery. Increase oxycodone, start antitussives-minimize IV Dilaudid as much as possible. Suspect that anxiety is playing some role, suspect if pain is controlled on oral narcotics-should be able to go home tomorrow morning.  Possible HCAP (healthcare-associated pneumonia):Although he has some changes in his CT chest-suspect this is mostly residual changes related to recent surgery. He does have some cough, but do not think he needs to be covered with both vancomycin and Zosyn. Hence will discontinue vancomycin and continue Zosyn with plans to taper further down to levofloxacin tomorrow if clinical improvement continues.Will plan a 5 day course of antibiotics.follow cultures   Hypertension: BP currently  controlled-continue to monitor off antihypertensives.  Anxiety: Continue as needed Xanax.  DVT Prophylaxis: Prophylactic Lovenox  Code Status: Full code   Family Communication: None at bedside  Disposition Plan: Remain inpatient-home 5/5 if clinical improvement continues  Antimicrobial agents: IV Vanco 5?3>>5/4 IV Zosyn 5/3>>  Procedures: None  CONSULTS:  None  Time spent: 25 minutes-Greater than 50% of this time was spent in counseling, explanation of diagnosis, planning of further management, and coordination of care.  MEDICATIONS: Anti-infectives    Start     Dose/Rate Route Frequency Ordered Stop   05/13/15 1400  piperacillin-tazobactam (ZOSYN) IVPB 3.375 g     3.375 g 12.5 mL/hr over 240 Minutes Intravenous Every 8 hours 05/13/15 0736     05/13/15 1400  vancomycin (VANCOCIN) IVPB 1000 mg/200 mL premix     1,000 mg 200 mL/hr over 60 Minutes Intravenous Every 8 hours 05/13/15 0818     05/13/15 0600  piperacillin-tazobactam (ZOSYN) IVPB 3.375 g     3.375 g 12.5 mL/hr over 240 Minutes Intravenous  Once 05/13/15 0228     05/13/15 0230  vancomycin (VANCOCIN) 1,500 mg in sodium chloride 0.9 % 500 mL IVPB     1,500 mg 250 mL/hr over 120 Minutes Intravenous  Once 05/13/15 0228 05/13/15 0545   05/12/15 2100  vancomycin (VANCOCIN) IVPB 1000 mg/200 mL premix     1,000 mg 200 mL/hr over 60 Minutes Intravenous  Once 05/12/15 2051 05/12/15 2300   05/12/15 2100  piperacillin-tazobactam (ZOSYN) IVPB 3.375 g     3.375 g 100 mL/hr over 30 Minutes Intravenous  Once 05/12/15 2051 05/12/15 2206      Scheduled Meds: . clotrimazole  Topical BID  . ipratropium  0.5 mg Nebulization Q6H  . levalbuterol  0.63 mg Nebulization Q6H  . mometasone-formoterol  2 puff Inhalation BID  . piperacillin-tazobactam (ZOSYN)  IV  3.375 g Intravenous Once  . piperacillin-tazobactam (ZOSYN)  IV  3.375 g Intravenous Q8H  . vancomycin  1,000 mg Intravenous Q8H   Continuous Infusions: . sodium  chloride 75 mL/hr at 05/13/15 0155   PRN Meds:.acetaminophen **OR** acetaminophen, ALPRAZolam, HYDROmorphone (DILAUDID) injection, ondansetron **OR** ondansetron (ZOFRAN) IV   PHYSICAL EXAM: Vital signs: Filed Vitals:   05/13/15 0100 05/13/15 0131 05/13/15 0455 05/13/15 0813  BP: 109/86 97/60 96/48    Pulse:  79 74   Temp:  99.2 F (37.3 C) 97.7 F (36.5 C)   TempSrc:  Oral Oral   Resp: 18 20 20    Height:      Weight:  114.261 kg (251 lb 14.4 oz)    SpO2:  94% 97% 96%   Filed Weights   05/12/15 1915 05/13/15 0131  Weight: 111.585 kg (246 lb) 114.261 kg (251 lb 14.4 oz)   Body mass index is 32.33 kg/(m^2).   Gen Exam: Awake and alert with clear speech. Not in any distress  Neck: Supple, No JVD.   Chest: B/L Clear.  Incision in the left lateral chest appears intact with stitches and placed-no discharge or erythema noted. Tender to palpation in the left lower chest area. CVS: S1 S2 Regular, no murmurs.  Abdomen: soft, BS +, non tender, non distended.  Extremities: no edema, lower extremities warm to touch. Neurologic: Non Focal.   Skin: No Rash or lesions   Wounds: N/A.   LABORATORY DATA: CBC:  Recent Labs Lab 05/12/15 1916 05/13/15 0554  WBC 9.0 8.1  NEUTROABS 5.6  --   HGB 12.7* 11.5*  HCT 36.5* 34.2*  MCV 93.1 95.0  PLT 369 [id number]    Basic Metabolic Panel:  Recent Labs Lab 05/07/15 0549 05/12/15 1916 05/13/15 0554  NA 137 135 137  K 3.4* 3.9 3.5  CL 99* 99* 100*  CO2 29 26 29   GLUCOSE 103* 120* 101*  BUN 7 12 13   CREATININE 1.05 1.00 1.04  CALCIUM 8.6* 8.8* 8.4*    GFR: Estimated Creatinine Clearance: 110.4 mL/min (by C-G formula based on Cr of 1.04).  Liver Function Tests:  Recent Labs Lab 05/12/15 1916 05/13/15 0554  AST 26 23  ALT 34 29  ALKPHOS 88 75  BILITOT 0.2* 0.3  PROT 7.2 6.8  ALBUMIN 3.3* 3.0*    Recent Labs Lab 05/12/15 1916  LIPASE 18   No results for input(s): AMMONIA in the last 168 hours.  Coagulation Profile: No  results for input(s): INR, PROTIME in the last 168 hours.  Cardiac Enzymes:  Recent Labs Lab 05/12/15 1916 05/13/15 0554  TROPONINI <0.03 <0.03    BNP (last 3 results) No results for input(s): PROBNP in the last 8760 hours.  HbA1C: No results for input(s): HGBA1C in the last 72 hours.  CBG: No results for input(s): GLUCAP in the last 168 hours.  Lipid Profile: No results for input(s): CHOL, HDL, LDLCALC, TRIG, CHOLHDL, LDLDIRECT in the last 72 hours.  Thyroid Function Tests: No results for input(s): TSH, T4TOTAL, FREET4, T3FREE, THYROIDAB in the last 72 hours.  Anemia Panel: No results for input(s): VITAMINB12, FOLATE, FERRITIN, TIBC, IRON, RETICCTPCT in the last 72 hours.  Urine analysis:    Component Value Date/Time   COLORURINE YELLOW 04/30/2015 0911   APPEARANCEUR CLEAR 04/30/2015 0911   LABSPEC 1.016  04/30/2015 0911   PHURINE 6.0 04/30/2015 0911   GLUCOSEU NEGATIVE 04/30/2015 0911   HGBUR NEGATIVE 04/30/2015 0911   BILIRUBINUR NEGATIVE 04/30/2015 0911   KETONESUR NEGATIVE 04/30/2015 0911   PROTEINUR NEGATIVE 04/30/2015 0911   UROBILINOGEN 0.2 08/15/2009 0510   NITRITE NEGATIVE 04/30/2015 0911   LEUKOCYTESUR NEGATIVE 04/30/2015 0911    Sepsis Labs: Lactic Acid, Venous    Component Value Date/Time   LATICACIDVEN 0.53 05/12/2015 2321    MICROBIOLOGY: Recent Results (from the past 240 hour(s))  Blood culture (routine x 2)     Status: None (Preliminary result)   Collection Time: 05/12/15  9:05 PM  Result Value Ref Range Status   Specimen Description BLOOD RIGHT ARM  Final   Special Requests BOTTLES DRAWN AEROBIC AND ANAEROBIC  Final   Culture PENDING  Incomplete   Report Status PENDING  Incomplete  Blood culture (routine x 2)     Status: None (Preliminary result)   Collection Time: 05/12/15  9:08 PM  Result Value Ref Range Status   Specimen Description BLOOD RIGHT HAND  Final   Special Requests BOTTLES DRAWN AEROBIC AND ANAEROBIC [facility]  Final   Culture  PENDING  Incomplete   Report Status PENDING  Incomplete    RADIOLOGY STUDIES/RESULTS: Dg Chest 2 View  05/07/2015  CLINICAL DATA:  Atelectasis, left lung surgery on Monday EXAM: CHEST  2 VIEW COMPARISON:  05/06/2015 FINDINGS: Cardiomediastinal silhouette is stable. Again noted mild elevation of the left hemidiaphragm. Left basilar atelectasis. Right lung is clear. There is no pneumothorax. Metallic fixation plate cervical spine again noted. No pulmonary edema. IMPRESSION: Mild elevation of the left hemidiaphragm again noted. Left basilar atelectasis. No pneumothorax. Right lung is clear. No pulmonary edema. Electronically Signed   By: Lahoma Crocker M.D.   On: 05/07/2015 08:35   Dg Chest 2 View  04/30/2015  CLINICAL DATA:  Status post left lower lobectomy EXAM: CHEST  2 VIEW COMPARISON:  Chest x-ray of April 24, 2015 FINDINGS: The lungs are adequately inflated. The interstitial markings are coarse though stable. The heart is normal in size. The pulmonary vascularity is not engorged. There is tortuosity of the descending thoracic aorta. There is no pleural effusion or pneumothorax. The patient has undergone previous lower anterior cervical fusion. IMPRESSION: No active cardiopulmonary disease. Electronically Signed   By: David  Martinique M.D.   On: 04/30/2015 09:13   Dg Chest 2 View  04/22/2015  CLINICAL DATA:  Shortness of breath with exertion. EXAM: CHEST  2 VIEW COMPARISON:  Radiograph of March 07, 2015. PET scan of March 16, 2015. FINDINGS: The heart size and mediastinal contours are within normal limits. Both lungs are clear. Left lower lobe mass noted on prior PET scan is not well visualized on this study. No pneumothorax or pleural effusion is noted. The visualized skeletal structures are unremarkable. IMPRESSION: No definite radiographic abnormality seen at this time. Left lower lobe mass noted on prior PET scan is not well visualized on this study. Electronically Signed   By: Marijo Conception, M.D.    On: 04/22/2015 07:52   Ct Angio Chest Pe W/cm &/or Wo Cm  05/12/2015  CLINICAL DATA:  Left-sided chest pain, shortness breath, and cough beginning last night. Recent left lower lobectomy for lung carcinoma. EXAM: CT ANGIOGRAPHY CHEST WITH CONTRAST TECHNIQUE: Multidetector CT imaging of the chest was performed using the standard protocol during bolus administration of intravenous contrast. Multiplanar CT image reconstructions and MIPs were obtained to evaluate the vascular anatomy. CONTRAST:  19mL OMNIPAQUE IOHEXOL 350 MG/ML SOLN COMPARISON:  03/10/2015 FINDINGS: Mediastinum/Lymph Nodes: No pulmonary emboli or thoracic aortic dissection identified. No evidence of cardiomegaly or pericardial effusion. No masses or pathologically enlarged lymph nodes identified. Lungs/Pleura: Patient has undergone left lower lobectomy since previous study. Small left pleural effusion is seen without evidence pneumothorax. There is also scattered areas of patchy airspace disease in the left upper lobe which may be due to postop contusion, atelectasis, or pneumonia. Right lung remains clear. Upper abdomen: No acute findings. Musculoskeletal: No chest wall mass or suspicious bone lesions identified. Review of the MIP images confirms the above findings. IMPRESSION: No evidence of pulmonary embolism. Postop changes from left lower lobectomy. Small left pleural effusion. Patchy left upper lobe airspace disease which may represent postop contusion, atelectasis, or pneumonia. Electronically Signed   By: Earle Gell M.D.   On: 05/12/2015 20:43   Dg Chest 1v Repeat Same Day  05/06/2015  CLINICAL DATA:  Status post video-assisted thorascopic left lobectomy. Status post chest tube removal. EXAM: CHEST - 1 VIEW SAME DAY COMPARISON:  Multiple prior exams, most recent dated 05/06/2015 at 7:18 p.m. FINDINGS: Left chest tube removed since the earlier study. No convincing apical pneumothorax on this semi-erect study. There is opacity with a  well-defined lobulated margins along the central left lung extending to the base. This is consistent with partly atelectatic left lung. Possibility of an anterior pneumothorax should be considered. There are low lung volumes on the right.  Right lung is clear. Left internal jugular central venous line has also been removed. IMPRESSION: 1. Status post removal of the remaining support apparatus occluding the left internal jugular central venous line and left chest tube. 2. Tiny apical pneumothorax seen on the earlier exam is not appreciated currently. 3. Central left mid to lower lung zone opacity, more prominent on the earlier study, likely due to partial atelectasis. Consider possible anterior pneumothorax, which would be best evaluated with chest CT or, when patient can tolerate, an upright frontal and lateral chest radiograph. Electronically Signed   By: Lajean Manes M.D.   On: 05/06/2015 16:34   Dg Chest Portable 1 View  05/12/2015  CLINICAL DATA:  Status post VATS and left lobectomy 05/03/2015. Severe cough and left-sided chest pain since last night. EXAM: PORTABLE CHEST 1 VIEW COMPARISON:  Chest x-rays dated 05/07/2015 and 05/06/2015. FINDINGS: Cardiomediastinal silhouette appears stable in size and configuration. There is now central pulmonary vascular congestion and bilateral interstitial edema, left greater than right. There is also a new dense opacity at the left lung base, most likely a combination of atelectasis and small effusion. No pneumothorax seen. IMPRESSION: 1. New dense opacity at the left lung base, most likely a combination of atelectasis and small pleural effusion. 2. Central pulmonary vascular congestion and bilateral interstitial edema, left greater than right, suggesting volume overload/CHF. 3. No pneumothorax. Electronically Signed   By: Franki Cabot M.D.   On: 05/12/2015 19:39   Dg Chest Port 1 View  05/06/2015  CLINICAL DATA:  Followup pneumothorax EXAM: PORTABLE CHEST 1 VIEW  COMPARISON:  04/14/2025 FINDINGS: There remains a tiny apical left pneumothorax although slightly improved when compared with the prior exam. A left jugular catheter is again noted in the proximal superior vena cava. Interval removal of 1 chest tube is noted. A residual chest tube is seen. Left basilar atelectasis remains. The right lung remains clear. IMPRESSION: Tubes and lines as described. Tiny residual left apical pneumothorax although improved from the previous day. Left basilar  atelectasis is again noted. Electronically Signed   By: Inez Catalina M.D.   On: 05/06/2015 08:29   Dg Chest Port 1 View  05/05/2015  CLINICAL DATA:  Pneumothorax. EXAM: PORTABLE CHEST 1 VIEW COMPARISON:  05/04/2015 and 05/03/2015 FINDINGS: Two left-sided chest tubes in place. Tiny residual left apical pneumothorax. Left central line appears in good position. Heart size and pulmonary vascularity are normal. Right lung is clear. Minimal atelectasis at the left lung base. IMPRESSION: The tiny residual left apical pneumothorax. Minimal atelectasis at the left lung base. Electronically Signed   By: Lorriane Shire M.D.   On: 05/05/2015 08:09   Dg Chest Port 1 View  05/04/2015  CLINICAL DATA:  Pneumothorax. EXAM: PORTABLE CHEST 1 VIEW COMPARISON:  05/03/2015. FINDINGS: Two left chest tubes and left IJ line in stable position. Bibasilar atelectasis and/or infiltrates. Stable cardiomegaly. No significant pleural effusion. No pneumothorax. Prior cervical spine fusion. IMPRESSION: 1. Left IJ line and 2 left chest tubes in stable position. No pneumothorax. 2. Bibasilar atelectasis and/or infiltrates . Electronically Signed   By: Marcello Moores  Register   On: 05/04/2015 07:44   Dg Chest Port 1 View  05/03/2015  CLINICAL DATA:  Status post left VATS EXAM: PORTABLE CHEST 1 VIEW COMPARISON:  04/30/2015 FINDINGS: The jugular central line is noted. The cardiac shadow is prominent due to a poor inspiratory effort. No pneumothorax is seen. A left-sided  thoracostomy catheter and adjacent on cube catheter are noted. Postsurgical changes consistent with the left lower lobectomy are seen. No acute bony abnormality is noted. Changes of prior cervical fusion are noted. IMPRESSION: Postoperative change with tubes and lines as described. Electronically Signed   By: Inez Catalina M.D.   On: 05/03/2015 13:08   Dg Abd Acute W/chest  04/24/2015  CLINICAL DATA:  Acute onset of sharp upper abdominal pain, radiating to the groin. Constipation. Initial encounter. EXAM: DG ABDOMEN ACUTE W/ 1V CHEST COMPARISON:  Chest radiograph performed 04/22/2015, and PET/CT performed 03/16/2015 FINDINGS: The lungs are well-aerated. Mild peribronchial thickening is noted. There is no evidence of focal opacification, pleural effusion or pneumothorax. The cardiomediastinal silhouette is within normal limits. The visualized bowel gas pattern is unremarkable. Scattered stool and air are seen within the colon; there is no evidence of small bowel dilatation to suggest obstruction. No free intra-abdominal air is identified on the provided upright view. No acute osseous abnormalities are seen; the sacroiliac joints are unremarkable in appearance. Cervical spinal fusion hardware is noted. IMPRESSION: 1. Unremarkable bowel gas pattern; no free intra-abdominal air seen. Small amount of stool noted in the colon. 2. Mild peribronchial thickening noted. Lungs remain otherwise clear. Electronically Signed   By: Garald Balding M.D.   On: 04/24/2015 00:28   Dg C-arm Bronchoscopy  04/22/2015  CLINICAL DATA:  C-ARM BRONCHOSCOPY Fluoroscopy was utilized by the requesting physician.  No radiographic interpretation.       Oren Binet, MD  [facility] Pager:[phone]  If 7PM-7AM, please contact night-coverage [email or web address] Password [email or web address] 05/13/2015, 9:08 AM

## 2015-05-13 NOTE — Progress Notes (Signed)
ANTIBIOTIC CONSULT NOTE- follow up  Pharmacy Consult for vancomycin and Zosyn Indication: Pneumonia  Allergies  Allergen Reactions  . Morphine And Related Other (See Comments)    hallucinations   Patient Measurements: Height: 6\' 2"  (188 cm) Weight: 251 lb 14.4 oz (114.261 kg) IBW/kg (Calculated) : 82.2  Vital Signs: Temp: 97.7 F (36.5 C) (05/04 0455) Temp Source: Oral (05/04 0455) BP: 96/48 mmHg (05/04 0455) Pulse Rate: 74 (05/04 0455)  Labs:  Recent Labs  05/12/15 1916 05/13/15 0554  WBC 9.0 8.1  HGB 12.7* 11.5*  PLT 369 341  CREATININE 1.00 1.04   Estimated Creatinine Clearance: 110.4 mL/min (by C-G formula based on Cr of 1.04).  No results for input(s): VANCOTROUGH, VANCOPEAK, VANCORANDOM, GENTTROUGH, GENTPEAK, GENTRANDOM, TOBRATROUGH, TOBRAPEAK, TOBRARND, AMIKACINPEAK, AMIKACINTROU, AMIKACIN in the last 72 hours.   Recent Results (from the past 240 hour(s))  Blood culture (routine x 2)     Status: None (Preliminary result)   Collection Time: 05/12/15  9:05 PM  Result Value Ref Range Status   Specimen Description BLOOD RIGHT ARM  Final   Special Requests BOTTLES DRAWN AEROBIC AND ANAEROBIC  Final   Culture PENDING  Incomplete   Report Status PENDING  Incomplete  Blood culture (routine x 2)     Status: None (Preliminary result)   Collection Time: 05/12/15  9:08 PM  Result Value Ref Range Status   Specimen Description BLOOD RIGHT HAND  Final   Special Requests BOTTLES DRAWN AEROBIC AND ANAEROBIC 6CC  Final   Culture PENDING  Incomplete   Report Status PENDING  Incomplete   Medical History: Past Medical History  Diagnosis Date  . Hypertension 12/30/2011  . Allergy   . Peri-rectal abscess     multiple.   . Tobacco abuse   . Shortness of breath dyspnea     with exertion  . Pneumonia   . Umbilical hernia     has been repaired  . Loose stools   . Complication of anesthesia     woke up during colonoscopy  . Cancer ([place])     left lobe nodule  . Anal  fissure    Medications:  Vancomycin 1000 mg x 1 dose given in the ED 05/12/15 Zosyn 3.375 Gm IV x 1 dose given in the ED 05/12/15  Assessment: 55 yo male seen in the ED s/p recent VATS, LL lobectomy, chest tube removal 4/27; now with improving hemoptysis but increased SOB on exertion and increased pain. Chest CT showed patchy airspace disease and possible post-op contusion, atelectasis or pneumonia. Pt is afebrile with normal WBCs. Empiric antibiotics for pneumonia.  Goal of Therapy:  Vancomycin troughs 15-20 mcg/ml Eradicate infection  Plan:   Zosyn 3.375gm IV q8h, EID  Vancomycin 1000mg  IV q8h  Check trough at steady state  Monitor labs, renal fxn, progress and c/s  Deescalate ABX when improved / appropriate.   Hart Robinsons A, RPH 05/13/2015,8:20 AM

## 2015-05-14 DIAGNOSIS — I10 Essential (primary) hypertension: Secondary | ICD-10-CM

## 2015-05-14 DIAGNOSIS — R0782 Intercostal pain: Secondary | ICD-10-CM

## 2015-05-14 DIAGNOSIS — K602 Anal fissure, unspecified: Secondary | ICD-10-CM

## 2015-05-14 LAB — BASIC METABOLIC PANEL
Anion gap: 10 (ref 5–15)
BUN: 11 mg/dL (ref 6–20)
CALCIUM: 8.7 mg/dL — AB (ref 8.9–10.3)
CHLORIDE: 102 mmol/L (ref 101–111)
CO2: 25 mmol/L (ref 22–32)
CREATININE: 1.07 mg/dL (ref 0.61–1.24)
GFR calc Af Amer: >60 mL/min (ref >60)
GFR calc non Af Amer: >60 mL/min (ref >60)
Glucose, Bld: 91 mg/dL (ref 65–99)
Potassium: 4 mmol/L (ref 3.5–5.1)
Sodium: 137 mmol/L (ref 135–145)

## 2015-05-14 LAB — CBC
HCT: 36.6 % — ABNORMAL LOW (ref 39.0–52.0)
Hemoglobin: 12.6 g/dL — ABNORMAL LOW (ref 13.0–17.0)
MCH: 32.5 pg (ref 26.0–34.0)
MCHC: 34.4 g/dL (ref 30.0–36.0)
MCV: 94.3 fL (ref 78.0–100.0)
PLATELETS: 357 10*3/uL (ref 150–400)
RBC: 3.88 MIL/uL — ABNORMAL LOW (ref 4.22–5.81)
RDW: 14.1 % (ref 11.5–15.5)
WBC: 7 10*3/uL (ref 4.0–10.5)

## 2015-05-14 MED ORDER — LEVOFLOXACIN 750 MG PO TABS: 750.0000 mg | ORAL_TABLET | Freq: Every day | ORAL | Status: DC

## 2015-05-14 MED ORDER — ACETAMINOPHEN 500 MG PO TABS: 1000.0000 mg | ORAL_TABLET | Freq: Three times a day (TID) | ORAL | Status: DC | PRN

## 2015-05-14 MED ORDER — POLYETHYLENE GLYCOL 3350 17 G PO PACK: 17.0000 g | PACK | Freq: Every day | ORAL | Status: DC

## 2015-05-14 MED ORDER — CLOTRIMAZOLE 1 % EX CREA: TOPICAL_CREAM | Freq: Two times a day (BID) | CUTANEOUS | Status: DC

## 2015-05-14 MED ORDER — HYDROCOD POLST-CPM POLST ER 10-8 MG/5ML PO SUER: 5.0000 mL | Freq: Two times a day (BID) | ORAL | Status: DC | PRN

## 2015-05-14 MED ORDER — BUDESONIDE-FORMOTEROL FUMARATE 80-4.5 MCG/ACT IN AERO: 2.0000 | INHALATION_SPRAY | Freq: Two times a day (BID) | RESPIRATORY_TRACT | Status: DC

## 2015-05-14 MED ORDER — OXYCODONE HCL 5 MG PO TABS: 10.0000 mg | ORAL_TABLET | Freq: Four times a day (QID) | ORAL | Status: DC | PRN

## 2015-05-14 NOTE — Care Management Note (Signed)
Case Management Note  Patient Details  Name: Fernando [name] MRN: [id number] Date of Birth: Sep 23, 1960   Expected Discharge Date:    05/14/2015              Expected Discharge Plan:  Home/Self Care  In-House Referral:  NA  Discharge planning Services  CM Consult  Post Acute Care Choice:  NA Choice offered to:  NA  DME Arranged:    DME Agency:     HH Arranged:    [place] Agency:     Status of Service:  Completed, signed off  Medicare Important Message Given:    Date Medicare IM Given:    Medicare IM give by:    Date Additional Medicare IM Given:    Additional Medicare Important Message give by:     If discussed at [place] of Stay Meetings, dates discussed:    Additional Comments: Pt discharged home today. No CM needs.   Sherald Barge, RN 05/14/2015, 3:53 PM

## 2015-05-14 NOTE — Discharge Summary (Signed)
PATIENT DETAILS Name: Fernando [name] Age: 55 y.o. Sex: male Date of Birth: 02-24-1960 MRN: [id number]. Admitting Physician: Oswald Hillock, MD [id number], BENJAMIN, PA-C  Admit Date: 05/12/2015 Discharge date: 05/14/2015  Recommendations for Outpatient Follow-up:  1. Ensure follow-up with cardiothoracic surgery  2. Will need to be titrated off narcotics  3. Has a anal fissure since February 2017-ensure follow-up with [facility] [place] [facility] 4. Please repeat CBC/BMET at next visit 5. Please follow blood cultures till final  PRIMARY DISCHARGE DIAGNOSIS:  Active Problems:   Chest pain   HCAP (healthcare-associated pneumonia)   Pain in the chest      PAST MEDICAL HISTORY: Past Medical History  Diagnosis Date  . Hypertension 12/30/2011  . Allergy   . Peri-rectal abscess     multiple.   . Tobacco abuse   . Shortness of breath dyspnea     with exertion  . Pneumonia   . Umbilical hernia     has been repaired  . Loose stools   . Complication of anesthesia     woke up during colonoscopy  . Cancer ([place])     left lobe nodule  . Anal fissure     DISCHARGE MEDICATIONS: Current Discharge Medication List    START taking these medications   Details  acetaminophen (TYLENOL) 500 MG tablet Take 2 tablets (1,000 mg total) by mouth 3 (three) times daily as needed for mild pain (or Fever >/= 101).    chlorpheniramine-HYDROcodone (TUSSIONEX) 10-8 MG/5ML SUER Take 5 mLs by mouth every 12 (twelve) hours as needed for cough. Qty: 115 mL, Refills: 0    clotrimazole (LOTRIMIN) 1 % cream Apply topically 2 (two) times daily. Qty: 30 g, Refills: 0    levofloxacin (LEVAQUIN) 750 MG tablet Take 1 tablet (750 mg total) by mouth daily. Qty: 3 tablet, Refills: 0    polyethylene glycol (MIRALAX / GLYCOLAX) packet Take 17 g by mouth daily. Qty: 14 each, Refills: 0      CONTINUE these medications which have CHANGED   Details  budesonide-formoterol (SYMBICORT) 80-4.5 MCG/ACT inhaler Inhale 2  puffs into the lungs 2 (two) times daily. Qty: 1 Inhaler, Refills: 0    oxyCODONE (OXY IR/ROXICODONE) 5 MG immediate release tablet Take 2 tablets (10 mg total) by mouth every 6 (six) hours as needed for moderate pain. Qty: 35 tablet, Refills: 0   Associated Diagnoses: Post-op pain      CONTINUE these medications which have NOT CHANGED   Details  albuterol (PROVENTIL HFA;VENTOLIN HFA) 108 (90 Base) MCG/ACT inhaler Inhale 2 puffs into the lungs every 6 (six) hours as needed for wheezing or shortness of breath. Qty: 1 Inhaler, Refills: 12    ALPRAZolam (XANAX) 1 MG tablet Take 1 tablet (1 mg total) by mouth 3 (three) times daily as needed for anxiety. Qty: 30 tablet, Refills: 0      STOP taking these medications     lisinopril-hydrochlorothiazide (PRINZIDE,ZESTORETIC) 20-12.5 MG tablet         ALLERGIES:   Allergies  Allergen Reactions  . Morphine And Related Other (See Comments)    hallucinations    BRIEF HPI:  See H&P, Labs, Consult and Test reports for all details in brief, Patient is a 55 y.o. male who recently underwent left sided VATS, mediastinal lymph node dissection and left lower lobectomy by Dr. Roxan Hockey on 05/03/15, presented to the hospital with worsening of left-sided postoperative pain and cough. CT chest on admission negative for pulmonary embolism, but suggestive of possible  healthcare associated pneumonia.  CONSULTATIONS:   None  PERTINENT RADIOLOGIC STUDIES: Dg Chest 2 View  05/07/2015  CLINICAL DATA:  Atelectasis, left lung surgery on Monday EXAM: CHEST  2 VIEW COMPARISON:  05/06/2015 FINDINGS: Cardiomediastinal silhouette is stable. Again noted mild elevation of the left hemidiaphragm. Left basilar atelectasis. Right lung is clear. There is no pneumothorax. Metallic fixation plate cervical spine again noted. No pulmonary edema. IMPRESSION: Mild elevation of the left hemidiaphragm again noted. Left basilar atelectasis. No pneumothorax. Right lung is clear.  No pulmonary edema. Electronically Signed   By: Lahoma Crocker M.D.   On: 05/07/2015 08:35   Dg Chest 2 View  04/30/2015  CLINICAL DATA:  Status post left lower lobectomy EXAM: CHEST  2 VIEW COMPARISON:  Chest x-ray of April 24, 2015 FINDINGS: The lungs are adequately inflated. The interstitial markings are coarse though stable. The heart is normal in size. The pulmonary vascularity is not engorged. There is tortuosity of the descending thoracic aorta. There is no pleural effusion or pneumothorax. The patient has undergone previous lower anterior cervical fusion. IMPRESSION: No active cardiopulmonary disease. Electronically Signed   By: David  Martinique M.D.   On: 04/30/2015 09:13   Dg Chest 2 View  04/22/2015  CLINICAL DATA:  Shortness of breath with exertion. EXAM: CHEST  2 VIEW COMPARISON:  Radiograph of March 07, 2015. PET scan of March 16, 2015. FINDINGS: The heart size and mediastinal contours are within normal limits. Both lungs are clear. Left lower lobe mass noted on prior PET scan is not well visualized on this study. No pneumothorax or pleural effusion is noted. The visualized skeletal structures are unremarkable. IMPRESSION: No definite radiographic abnormality seen at this time. Left lower lobe mass noted on prior PET scan is not well visualized on this study. Electronically Signed   By: Marijo Conception, M.D.   On: 04/22/2015 07:52   Ct Angio Chest Pe W/cm &/or Wo Cm  05/12/2015  CLINICAL DATA:  Left-sided chest pain, shortness breath, and cough beginning last night. Recent left lower lobectomy for lung carcinoma. EXAM: CT ANGIOGRAPHY CHEST WITH CONTRAST TECHNIQUE: Multidetector CT imaging of the chest was performed using the standard protocol during bolus administration of intravenous contrast. Multiplanar CT image reconstructions and MIPs were obtained to evaluate the vascular anatomy. CONTRAST:  142mL OMNIPAQUE IOHEXOL 350 MG/ML SOLN COMPARISON:  03/10/2015 FINDINGS: Mediastinum/Lymph Nodes: No  pulmonary emboli or thoracic aortic dissection identified. No evidence of cardiomegaly or pericardial effusion. No masses or pathologically enlarged lymph nodes identified. Lungs/Pleura: Patient has undergone left lower lobectomy since previous study. Small left pleural effusion is seen without evidence pneumothorax. There is also scattered areas of patchy airspace disease in the left upper lobe which may be due to postop contusion, atelectasis, or pneumonia. Right lung remains clear. Upper abdomen: No acute findings. Musculoskeletal: No chest wall mass or suspicious bone lesions identified. Review of the MIP images confirms the above findings. IMPRESSION: No evidence of pulmonary embolism. Postop changes from left lower lobectomy. Small left pleural effusion. Patchy left upper lobe airspace disease which may represent postop contusion, atelectasis, or pneumonia. Electronically Signed   By: Earle Gell M.D.   On: 05/12/2015 20:43   Dg Chest 1v Repeat Same Day  05/06/2015  CLINICAL DATA:  Status post video-assisted thorascopic left lobectomy. Status post chest tube removal. EXAM: CHEST - 1 VIEW SAME DAY COMPARISON:  Multiple prior exams, most recent dated 05/06/2015 at 7:18 p.m. FINDINGS: Left chest tube removed since the earlier study.  No convincing apical pneumothorax on this semi-erect study. There is opacity with a well-defined lobulated margins along the central left lung extending to the base. This is consistent with partly atelectatic left lung. Possibility of an anterior pneumothorax should be considered. There are low lung volumes on the right.  Right lung is clear. Left internal jugular central venous line has also been removed. IMPRESSION: 1. Status post removal of the remaining support apparatus occluding the left internal jugular central venous line and left chest tube. 2. Tiny apical pneumothorax seen on the earlier exam is not appreciated currently. 3. Central left mid to lower lung zone opacity,  more prominent on the earlier study, likely due to partial atelectasis. Consider possible anterior pneumothorax, which would be best evaluated with chest CT or, when patient can tolerate, an upright frontal and lateral chest radiograph. Electronically Signed   By: Lajean Manes M.D.   On: 05/06/2015 16:34   Dg Chest Portable 1 View  05/12/2015  CLINICAL DATA:  Status post VATS and left lobectomy 05/03/2015. Severe cough and left-sided chest pain since last night. EXAM: PORTABLE CHEST 1 VIEW COMPARISON:  Chest x-rays dated 05/07/2015 and 05/06/2015. FINDINGS: Cardiomediastinal silhouette appears stable in size and configuration. There is now central pulmonary vascular congestion and bilateral interstitial edema, left greater than right. There is also a new dense opacity at the left lung base, most likely a combination of atelectasis and small effusion. No pneumothorax seen. IMPRESSION: 1. New dense opacity at the left lung base, most likely a combination of atelectasis and small pleural effusion. 2. Central pulmonary vascular congestion and bilateral interstitial edema, left greater than right, suggesting volume overload/CHF. 3. No pneumothorax. Electronically Signed   By: Franki Cabot M.D.   On: 05/12/2015 19:39   Dg Chest Port 1 View  05/06/2015  CLINICAL DATA:  Followup pneumothorax EXAM: PORTABLE CHEST 1 VIEW COMPARISON:  04/14/2025 FINDINGS: There remains a tiny apical left pneumothorax although slightly improved when compared with the prior exam. A left jugular catheter is again noted in the proximal superior vena cava. Interval removal of 1 chest tube is noted. A residual chest tube is seen. Left basilar atelectasis remains. The right lung remains clear. IMPRESSION: Tubes and lines as described. Tiny residual left apical pneumothorax although improved from the previous day. Left basilar atelectasis is again noted. Electronically Signed   By: Inez Catalina M.D.   On: 05/06/2015 08:29   Dg Chest Port 1  View  05/05/2015  CLINICAL DATA:  Pneumothorax. EXAM: PORTABLE CHEST 1 VIEW COMPARISON:  05/04/2015 and 05/03/2015 FINDINGS: Two left-sided chest tubes in place. Tiny residual left apical pneumothorax. Left central line appears in good position. Heart size and pulmonary vascularity are normal. Right lung is clear. Minimal atelectasis at the left lung base. IMPRESSION: The tiny residual left apical pneumothorax. Minimal atelectasis at the left lung base. Electronically Signed   By: Lorriane Shire M.D.   On: 05/05/2015 08:09   Dg Chest Port 1 View  05/04/2015  CLINICAL DATA:  Pneumothorax. EXAM: PORTABLE CHEST 1 VIEW COMPARISON:  05/03/2015. FINDINGS: Two left chest tubes and left IJ line in stable position. Bibasilar atelectasis and/or infiltrates. Stable cardiomegaly. No significant pleural effusion. No pneumothorax. Prior cervical spine fusion. IMPRESSION: 1. Left IJ line and 2 left chest tubes in stable position. No pneumothorax. 2. Bibasilar atelectasis and/or infiltrates . Electronically Signed   By: Marcello Moores  Register   On: 05/04/2015 07:44   Dg Chest Port 1 View  05/03/2015  CLINICAL DATA:  Status post left VATS EXAM: PORTABLE CHEST 1 VIEW COMPARISON:  04/30/2015 FINDINGS: The jugular central line is noted. The cardiac shadow is prominent due to a poor inspiratory effort. No pneumothorax is seen. A left-sided thoracostomy catheter and adjacent on cube catheter are noted. Postsurgical changes consistent with the left lower lobectomy are seen. No acute bony abnormality is noted. Changes of prior cervical fusion are noted. IMPRESSION: Postoperative change with tubes and lines as described. Electronically Signed   By: Inez Catalina M.D.   On: 05/03/2015 13:08   Dg Abd Acute W/chest  04/24/2015  CLINICAL DATA:  Acute onset of sharp upper abdominal pain, radiating to the groin. Constipation. Initial encounter. EXAM: DG ABDOMEN ACUTE W/ 1V CHEST COMPARISON:  Chest radiograph performed 04/22/2015, and PET/CT  performed 03/16/2015 FINDINGS: The lungs are well-aerated. Mild peribronchial thickening is noted. There is no evidence of focal opacification, pleural effusion or pneumothorax. The cardiomediastinal silhouette is within normal limits. The visualized bowel gas pattern is unremarkable. Scattered stool and air are seen within the colon; there is no evidence of small bowel dilatation to suggest obstruction. No free intra-abdominal air is identified on the provided upright view. No acute osseous abnormalities are seen; the sacroiliac joints are unremarkable in appearance. Cervical spinal fusion hardware is noted. IMPRESSION: 1. Unremarkable bowel gas pattern; no free intra-abdominal air seen. Small amount of stool noted in the colon. 2. Mild peribronchial thickening noted. Lungs remain otherwise clear. Electronically Signed   By: Garald Balding M.D.   On: 04/24/2015 00:28   Dg C-arm Bronchoscopy  04/22/2015  CLINICAL DATA:  C-ARM BRONCHOSCOPY Fluoroscopy was utilized by the requesting physician.  No radiographic interpretation.     PERTINENT LAB RESULTS: CBC:  Recent Labs  05/13/15 0554 05/14/15 0555  WBC 8.1 7.0  HGB 11.5* 12.6*  HCT 34.2* 36.6*  PLT 341 357   CMET CMP     Component Value Date/Time   NA 137 05/14/2015 0555   K 4.0 05/14/2015 0555   CL 102 05/14/2015 0555   CO2 25 05/14/2015 0555   GLUCOSE 91 05/14/2015 0555   BUN 11 05/14/2015 0555   CREATININE 1.07 05/14/2015 0555   CALCIUM 8.7* 05/14/2015 0555   PROT 6.8 05/13/2015 0554   ALBUMIN 3.0* 05/13/2015 0554   AST 23 05/13/2015 0554   ALT 29 05/13/2015 0554   ALKPHOS 75 05/13/2015 0554   BILITOT 0.3 05/13/2015 0554   GFRNONAA >60 05/14/2015 0555   GFRAA >60 05/14/2015 0555    GFR Estimated Creatinine Clearance: 107.3 mL/min (by C-G formula based on Cr of 1.07).  Recent Labs  05/12/15 1916  LIPASE 18    Recent Labs  05/12/15 1916 05/13/15 0554  TROPONINI <0.03 <0.03   Invalid input(s): POCBNP No results  for input(s): DDIMER in the last 72 hours. No results for input(s): HGBA1C in the last 72 hours. No results for input(s): CHOL, HDL, LDLCALC, TRIG, CHOLHDL, LDLDIRECT in the last 72 hours. No results for input(s): TSH, T4TOTAL, T3FREE, THYROIDAB in the last 72 hours.  Invalid input(s): FREET3 No results for input(s): VITAMINB12, FOLATE, FERRITIN, TIBC, IRON, RETICCTPCT in the last 72 hours. Coags: No results for input(s): INR in the last 72 hours.  Invalid input(s): PT Microbiology: Recent Results (from the past 240 hour(s))  Blood culture (routine x 2)     Status: None (Preliminary result)   Collection Time: 05/12/15  9:05 PM  Result Value Ref Range Status   Specimen Description BLOOD RIGHT ARM  Final  Special Requests BOTTLES DRAWN AEROBIC AND ANAEROBIC  Final   Culture NO GROWTH < 24 HOURS  Final   Report Status PENDING  Incomplete  Blood culture (routine x 2)     Status: None (Preliminary result)   Collection Time: 05/12/15  9:08 PM  Result Value Ref Range Status   Specimen Description BLOOD RIGHT HAND  Final   Special Requests BOTTLES DRAWN AEROBIC AND ANAEROBIC 6CC  Final   Culture NO GROWTH < 24 HOURS  Final   Report Status PENDING  Incomplete     BRIEF HOSPITAL COURSE:  Chest pain:is atypical for CAD-more consistent with worsening postoperative pain form worsening cough. Troponins and EKG are negative. CTA chest negative for pulmonary embolism or dissection. Per patient, this is a similar pain he's been having since discharge from the hospital post surgery (underwent left sided VATS, mediastinal lymph node dissection and left lower lobectomy by Dr. Roxan Hockey on 05/03/15)-but much worse due to worsening cough. Patient was admitted and given supportive care with as needed narcotics, IV antibiotics. He feels much better this morning, and is requesting discharge. His pain is very well controlled with current narcotic regimen. Suspect that as cough gets better, pain should slowly  improve.we will provide him with a few days supply of narcotics, but he knows that this will need to be tapered off [place]. [facility]-he has been asked to keep this appointment   Possible HCAP (healthcare-associated pneumonia):Although he has some changes in his CT chest-suspect this is mostly residual changes related to recent surgery. He was empirically covered with vancomycin and Zosyn, subsequently vancomycin was discontinued and patient was just maintained on Zosyn. His blood cultures continue to be negative. We will treat him with levofloxacin for 3 more days to complete a five-day course. As noted above, blood cultures continue to be negative. He already has follow-up with the [facility] on 5/8, and follow-up with cardiothoracic surgery in the next few weeks. He has been asked to keep both these appointments.  Hypertension: BP currently controlled and in fact soft-I do not think he requires any antihypertensives at this time. He was on ACE inhibitor, that could be a contributing to his cough is well. Please reevaluate at next visit with PCP-and restart antihypertensives as indicated. However if in the future, he does require antihypertensive-suggest avoiding ACE inhibitor due to cough.  Anxiety: Continue as needed Xanax  Anal fissure: Patient apparently has a history of having anal fissure since February of this year. He occasionally has some mild rectal bleeding. I've asked him to follow-up with a surgeon of his choice-he apparently has been trying to get in touch with [facility]. I have stressed the importance of having soft stools, he will be discharged on MiraLAX. At that time of this discharge-on examination-it appeared stable and without bleeding. I do not think at this time this warrants inpatient surgical consultation.  TODAY-DAY OF DISCHARGE:  Subjective:   Ira Younghans today has no headache, abdominal pain,no new  weakness tingling or numbness, feels much better wants to go home today. He appears much more comfortable and clinically improved as compared to yesterday. He has very minimal chest pain at this time.  Objective:   Blood pressure 95/75, pulse 76, temperature 97.7 F (36.5 C), temperature source Oral, resp. rate 20, height 6\' 2"  (1.88 m), weight 114.261 kg (251 lb 14.4 oz), SpO2 93 %.  Intake/Output Summary (Last 24 hours) at 05/14/15  [place] filed at 05/13/15 2100  Gross per 24 hour  Intake    480 ml  Output   1650 ml  Net  -1170 ml   Filed Weights   05/12/15 1915 05/13/15 0131  Weight: 111.585 kg (246 lb) 114.261 kg (251 lb 14.4 oz)    Exam Awake Alert, Oriented *3, No new F.N deficits, Normal affect [place].AT,PERRAL Supple Neck,No JVD, No cervical lymphadenopathy appriciated.  Symmetrical Chest wall movement, Good air movement bilaterally, CTAB RRR,No Gallops,Rubs or new Murmurs, No Parasternal Heave +ve B.Sounds, Abd Soft, Non tender, No organomegaly appriciated, No rebound -guarding or rigidity. No Cyanosis, Clubbing or edema, No new Rash or bruise  DISCHARGE CONDITION: Stable  DISPOSITION: Home  DISCHARGE INSTRUCTIONS:    Activity:  As tolerated   Get Medicines reviewed and adjusted: Please take all your medications with you for your next visit with your Primary MD  Please request your Primary MD to go over all hospital tests and procedure/radiological results at the follow up, please ask your Primary MD to get all Hospital records sent to his/her office.  If you experience worsening of your admission symptoms, develop shortness of breath, life threatening emergency, suicidal or homicidal thoughts you must seek medical attention immediately by calling 911 or calling your MD immediately  if symptoms less severe.  You must read complete instructions/literature along with all the possible adverse reactions/side effects for all the Medicines you take and that have  been prescribed to you. Take any new Medicines after you have completely understood and accpet all the possible adverse reactions/side effects.   Do not drive when taking Pain medications.   Do not take more than prescribed Pain, Sleep and Anxiety Medications  Special Instructions: If you have smoked or chewed Tobacco  in the last 2 yrs please stop smoking, stop any regular Alcohol  and or any Recreational drug use.  Wear Seat belts while driving.  Please note  You were cared for by a hospitalist during your hospital stay. Once you are discharged, your primary care physician will handle any further medical issues. Please note that NO REFILLS for any discharge medications will be authorized once you are discharged, as it is imperative that you return to your primary care physician (or establish a relationship with a primary care physician if you do not have one) for your aftercare needs so that they can reassess your need for medications and monitor your lab values.  Diet recommendation: Heart Healthy diet  Discharge Instructions    Call MD for:  redness, tenderness, or signs of infection (pain, swelling, redness, odor or green/yellow discharge around incision site)    Complete by:  As directed      Call MD for:  severe uncontrolled pain    Complete by:  As directed      Diet - low sodium heart healthy    Complete by:  As directed      Increase activity slowly    Complete by:  As directed            Follow-up Information    Follow up with Maren Reamer, MD On 05/17/2015.   Specialty:  Internal Medicine   Why:  Hospital follow up-appointment at 5 pm   Contact information:   [place] [address] [phone]       Follow up with Melrose Nakayama, MD.   Specialty:  Cardiothoracic Surgery   Why:  keep appointment as scheduled   Contact  information:   [address] [place] [address] [phone]       Follow up with [facility].   Specialty:  General Surgery   Why:  keep appointment as scheduled   Contact information:   [address] [place] [address] [phone]       Total Time spent on discharge equals  45 minutes.  SignedOren Binet 05/14/2015 8:22 AM

## 2015-05-14 NOTE — Progress Notes (Signed)
Pt discharged home today per Dr. Ghimire. Pt's IV site D/C'd and WDL. Pt's VSS. Pt provided with home medication list, discharge instructions and prescriptions. Verbalized understanding. Pt left floor via WC in stable condition accompanied by NT. 

## 2015-05-17 ENCOUNTER — Ambulatory Visit: Payer: Medicaid Other | Attending: Internal Medicine | Admitting: Internal Medicine

## 2015-05-17 ENCOUNTER — Encounter: Payer: Self-pay | Admitting: Internal Medicine

## 2015-05-17 VITALS — BP 144/101 | HR 91 | Temp 98.1°F | Resp 22 | Ht 73.0 in | Wt 243.6 lb

## 2015-05-17 DIAGNOSIS — R911 Solitary pulmonary nodule: Secondary | ICD-10-CM | POA: Insufficient documentation

## 2015-05-17 DIAGNOSIS — J189 Pneumonia, unspecified organism: Secondary | ICD-10-CM

## 2015-05-17 DIAGNOSIS — K602 Anal fissure, unspecified: Secondary | ICD-10-CM | POA: Diagnosis not present

## 2015-05-17 DIAGNOSIS — Y95 Nosocomial condition: Secondary | ICD-10-CM | POA: Insufficient documentation

## 2015-05-17 DIAGNOSIS — F419 Anxiety disorder, unspecified: Secondary | ICD-10-CM | POA: Insufficient documentation

## 2015-05-17 DIAGNOSIS — Z9889 Other specified postprocedural states: Secondary | ICD-10-CM | POA: Diagnosis not present

## 2015-05-17 DIAGNOSIS — Z87891 Personal history of nicotine dependence: Secondary | ICD-10-CM | POA: Diagnosis not present

## 2015-05-17 DIAGNOSIS — I10 Essential (primary) hypertension: Secondary | ICD-10-CM | POA: Diagnosis not present

## 2015-05-17 DIAGNOSIS — Z79899 Other long term (current) drug therapy: Secondary | ICD-10-CM | POA: Insufficient documentation

## 2015-05-17 DIAGNOSIS — Z888 Allergy status to other drugs, medicaments and biological substances status: Secondary | ICD-10-CM | POA: Insufficient documentation

## 2015-05-17 DIAGNOSIS — L7682 Other postprocedural complications of skin and subcutaneous tissue: Secondary | ICD-10-CM

## 2015-05-17 DIAGNOSIS — R918 Other nonspecific abnormal finding of lung field: Secondary | ICD-10-CM

## 2015-05-17 DIAGNOSIS — R208 Other disturbances of skin sensation: Secondary | ICD-10-CM

## 2015-05-17 LAB — CULTURE, BLOOD (ROUTINE X 2)
CULTURE: NO GROWTH
Culture: NO GROWTH

## 2015-05-17 MED ORDER — LISINOPRIL 20 MG PO TABS: 20.0000 mg | ORAL_TABLET | Freq: Every day | ORAL | Status: DC

## 2015-05-17 NOTE — Progress Notes (Signed)
Fernando [name], is a 55 y.o. male  [id number]  [id number]  DOB - 1960/09/19  CC:  Chief Complaint  Patient presents with  . Follow-up    left lung nodule       HPI: Fernando [name] is a 55 y.o. male w/ hx of HTN, anal fissures/abscess, tobacco abuse (1.5ppd x 15 years, quit about 2 months ago), here today to establish medical care.  Per Fernando [name], he was pending surgery for multiple perirectal abscesses/scrotal abscess when CXR at [facility] found opacity in LLL.  He recently underwent VATS, left Lower Lobectomy, chest tube placement which was removed on 05/06/15.  Bxs noted showed inflammatory lung parenchyma, but negative for malignancy.  He states since the surgery, he gets easily SOB and has constant post-surg pain in left lower lobe region radiating to his back.  Has a hard time getting to sleep and comfortable due to pain.  Of note, he is still dealing w/ persistant constant perianal pain/drainage and wants to see a GI doctor but has not been able to set up w/ new doctor here in [place].    He was recently readmitted 5/3- 05/14/15 for concerns of HCAP, which was treated fully w/ 5 days antibiotics.  He was discharged home on 05/14/15 w/ oxycodone 5mg  IR (#35tabs), but now asking me for more pain meds because of significant pain.  He currently uses ativan prn for anxiety, but denies si/hi/ah/vh  Per mom who is here w/ Fernando [name], Fernando [name] is living w/ her to save cost.  Mom's brother actually killed himself in past w/ GSW to mouth, at time on lexapro.  Fernando [name] is not interested in Lexapro or other antianxiety meds at this time.   Fernando [name] is currently working on  getting Medicaid as well as Disability soon.  Per Fernando [name] still taking lisinopril 20mg  daily, last dose this am, w/o issues.  Patient has No headache, No chest pain, No abdominal pain - No Nausea, No new weakness tingling or numbness, No Cough +post surg pain., +doe  Allergies  Allergen Reactions  . Morphine And Related Other (See Comments)   hallucinations   Past Medical History  Diagnosis Date  . Hypertension 12/30/2011  . Allergy   . Peri-rectal abscess     multiple.   . Tobacco abuse   . Shortness of breath dyspnea     with exertion  . Pneumonia   . Umbilical hernia     has been repaired  . Loose stools   . Complication of anesthesia     woke up during colonoscopy  . Cancer ([place])     left lobe nodule  . Anal fissure    Current Outpatient Prescriptions on File Prior to Visit  Medication Sig Dispense Refill  . acetaminophen (TYLENOL) 500 MG tablet Take 2 tablets (1,000 mg total) by mouth 3 (three) times daily as needed for mild pain (or Fever >/= 101).    Marland Kitchen albuterol (PROVENTIL HFA;VENTOLIN HFA) 108 (90 Base) MCG/ACT inhaler Inhale 2 puffs into the lungs every 6 (six) hours as needed for wheezing or shortness of breath. 1 Inhaler 12  . ALPRAZolam (XANAX) 1 MG tablet Take 1 tablet (1 mg total) by mouth 3 (three) times daily as needed for anxiety. 30 tablet 0  . budesonide-formoterol (SYMBICORT) 80-4.5 MCG/ACT inhaler Inhale 2 puffs into the lungs 2 (two) times daily. 1 Inhaler 0  . clotrimazole (LOTRIMIN) 1 % cream Apply topically 2 (two) times daily. 30 g 0  . levofloxacin (LEVAQUIN) 750 MG tablet  Take 1 tablet (750 mg total) by mouth daily. 3 tablet 0  . oxyCODONE (OXY IR/ROXICODONE) 5 MG immediate release tablet Take 2 tablets (10 mg total) by mouth every 6 (six) hours as needed for moderate pain. 35 tablet 0  . polyethylene glycol (MIRALAX / GLYCOLAX) packet Take 17 g by mouth daily. 14 each 0  . chlorpheniramine-HYDROcodone (TUSSIONEX) 10-8 MG/5ML SUER Take 5 mLs by mouth every 12 (twelve) hours as needed for cough. (Patient not taking: Reported on 05/17/2015) 115 mL 0   No current facility-administered medications on file prior to visit.   Family History  Problem Relation Age of Onset  . Breast cancer Mother    Social History   Social History  . Marital Status: Legally Separated    Spouse Name: N/A  .  Number of Children: N/A  . Years of Education: N/A   Occupational History  . Not on file.   Social History Main Topics  . Smoking status: Former Smoker -- 0.00 packs/day for 15 years    Types: Cigarettes    Quit date: 03/05/2015  . Smokeless tobacco: Never Used  . Alcohol Use: No     Comment: occ  . Drug Use: No  . Sexual Activity: No   Other Topics Concern  . Not on file   Social History Narrative    Review of Systems: Constitutional: Negative for fever, chills, diaphoresis, activity change, appetite change and fatigue. HENT: Negative for ear pain, nosebleeds, congestion, facial swelling, rhinorrhea, neck pain, neck stiffness and ear discharge.  Eyes: Negative for pain, discharge, redness, itching and visual disturbance. Respiratory: Negative for cough, choking, chest tightness, wheezing and stridor.   +significant SOB/DOE w/ short distance, still using his Insens spirometer at home.  Denies coughing /bronchospams, but significant post surg pain. At lll radiating to his back. Hard time getting to sleep due to pain waking him up.  Can only sleep on his stomach. Cardiovascular: Negative for chest pain, palpitations and leg swelling. Gastrointestinal: Negative for abdominal distention. +rectal abscess/draining on/off, uses pads, denies fevers, bloody at times. Genitourinary: Negative for dysuria, urgency, frequency, hematuria, flank pain, decreased urine volume, difficulty urinating and dyspareunia.  Musculoskeletal: Negative for back pain, joint swelling, arthralgia and gait problem. Neurological: Negative for dizziness, tremors, seizures, syncope, facial asymmetry, speech difficulty, weakness, light-headedness, numbness and headaches.  Hematological: Negative for adenopathy. Does not bruise/bleed easily. Psychiatric/Behavioral: Negative for hallucinations, behavioral problems, confusion, dysphoric mood, decreased concentration and agitation.   Depressed/anxious  from currently  medical issues, but denies si/hi.   Objective:   Filed Vitals:   05/17/15 1703  BP: 144/101  Pulse: 91  Temp: 98.1 F (36.7 C)  Resp: 22    Filed Weights   05/17/15 1703  Weight: 243 lb 9.6 oz (110.496 kg)    BP Readings from Last 3 Encounters:  05/17/15 144/101  05/14/15 95/75  05/07/15 117/73    Physical Exam: Constitutional: Patient appears well-developed and well-nourished. No distress. AAOx3, anxious male. HENT: Normocephalic, atraumatic, External right and left ear normal. Oropharynx is clear and moist.  Eyes: Conjunctivae and EOM are normal. PERRL, no scleral icterus. Neck: Normal ROM. Neck supple. No JVD. CVS: RRR, S1/S2 +, no murmurs, no gallops Pulmonary: breath sounds normal, no stridor, rhonchi, wheezes, rales. , but some postsurg chest pains w/ deep inhalation. Abdominal: Soft. BS +, no distension, tenderness, rebound or guarding.  Musculoskeletal: Normal range of motion. No edema and no tenderness.  LE: bilat/ no c/c/e, pulses 2+ bilateral. Lymphadenopathy: No lymphadenopathy  noted, cervical Neuro: Alert. muscle tone coordination. No cranial nerve deficit grossly. Skin: Skin is warm and dry. No rash noted. Not diaphoretic. No erythema. No pallor. Psychiatric: anxious.  Behavior, judgment, thought content normal.  Lab Results  Component Value Date   WBC 7.0 05/14/2015   HGB 12.6* 05/14/2015   HCT 36.6* 05/14/2015   MCV 94.3 05/14/2015   PLT 357 05/14/2015   Lab Results  Component Value Date   CREATININE 1.07 05/14/2015   BUN 11 05/14/2015   NA 137 05/14/2015   K 4.0 05/14/2015   CL 102 05/14/2015   CO2 25 05/14/2015    Lab Results  Component Value Date   HGBA1C * 08/16/2009    6.3 (NOTE)                                                                       According to the ADA Clinical Practice Recommendations for 2011, when HbA1c is used as a screening test:   >=6.5%   Diagnostic of Diabetes Mellitus           (if abnormal result  is  confirmed)  5.7-6.4%   Increased risk of developing Diabetes Mellitus  References:Diagnosis and Classification of Diabetes Mellitus,Diabetes [id number] 1):S62-S69 and Standards of Medical Care in         Diabetes - 2011,Diabetes Care,2011,34  (Suppl 1):S11-S61.   Lipid Panel     Component Value Date/Time   CHOL  08/16/2009 0553    122        ATP III CLASSIFICATION:  <200     mg/dL   Desirable  200-239  mg/dL   Borderline High  >=240    mg/dL   High          TRIG 172* 08/16/2009 0553   HDL 40 08/16/2009 0553   CHOLHDL 3.1 08/16/2009 0553   VLDL 34 08/16/2009 0553   LDLCALC  08/16/2009 0553    48        Total Cholesterol/HDL:CHD Risk Coronary Heart Disease Risk Table                     Men   Women  1/2 Average Risk   3.4   3.3  Average Risk       5.0   4.4  2 X Average Risk   9.6   7.1  3 X Average Risk  23.4   11.0        Use the calculated Patient Ratio above and the CHD Risk Table to determine the patient's CHD Risk.        ATP III CLASSIFICATION (LDL):  <100     mg/dL   Optimal  100-129  mg/dL   Near or Above                    Optimal  130-159  mg/dL   Borderline  160-189  mg/dL   High  >190     mg/dL   Very High       Depression screen [facility] 2/9 05/17/2015  Decreased Interest 0  Down, Depressed, Hopeless 0  PHQ - 2 Score 0   Surgical path 05/03/15 Lung resection: inflammation w/ giant cells, organized fibrosis and inflammation, no  malignancy All Lymph nodes neg for Malignancy  Assessment and plan:   1. Pain at surgical incision, sp VATS/ LL lobectomy 05/03/15 - path neg for malignancy - Ambulatory referral to Pain Clinic - pain management referral - has f/u Dr Roxan Hockey CTS 06/01/15  2. Anal fissure/abscess - has no f/u at this time, had seen [facility], but apparently doctor not seeing him anymore? - Ambulatory referral to Gastroenterology - for evaluation and treatment  3. HCAP (healthcare-associated pneumonia) - recent hospitalization,  appears clinically improved, encouraged frequent use of Insen spirometer.  4. Essential hypertension - elavated today, but suspect due to anxiety as well - continue lisinopril 20 qd (Fernando [name] denies coughing w/ it currently, and has been on it for about 2 years)  5. Lung mass - sp LL lobectomy 05/03/15, path neg malignancy  6. Anxiety/stress - recd wean down on ativan as able, per Fernando [name] has been on it very long time - not interested in antianxiety/antideppressent given personal hx of uncle GSW /successful suicide while on lexapro (per mother) - Fernando [name] not interested in seeing behavioral health at this time   Return in about 2 months (around 07/17/2015), or if symptoms worsen or fail to improve.  The patient was given clear instructions to go to ER or return to medical center if symptoms don't improve, worsen or new problems develop. The patient verbalized understanding. The patient was told to call to get lab results if they haven't heard anything in the next week.      Maren Reamer, MD, [place], [place]   05/17/2015, 5:26 PM

## 2015-05-17 NOTE — Progress Notes (Signed)
Patient is here for ED FU for left Lung Nodule  Patient complains of left sided chest pain with exertion.  Patient has taken medication and patient has eaten today.

## 2015-05-21 ENCOUNTER — Encounter: Payer: Self-pay | Admitting: *Deleted

## 2015-05-21 ENCOUNTER — Ambulatory Visit (INDEPENDENT_AMBULATORY_CARE_PROVIDER_SITE_OTHER): Payer: Self-pay | Admitting: *Deleted

## 2015-05-21 DIAGNOSIS — D1432 Benign neoplasm of left bronchus and lung: Secondary | ICD-10-CM

## 2015-05-21 DIAGNOSIS — Z09 Encounter for follow-up examination after completed treatment for conditions other than malignant neoplasm: Secondary | ICD-10-CM

## 2015-05-21 DIAGNOSIS — Z902 Acquired absence of lung [part of]: Secondary | ICD-10-CM

## 2015-05-21 DIAGNOSIS — D360 Benign neoplasm of lymph nodes: Secondary | ICD-10-CM

## 2015-05-21 DIAGNOSIS — Z4802 Encounter for removal of sutures: Secondary | ICD-10-CM

## 2015-05-21 NOTE — Progress Notes (Signed)
erro  neous encounter

## 2015-05-21 NOTE — Progress Notes (Signed)
Fernando [name] has returned s/p Left Vats, LLLobectomy for suture removal of two previous chest tube sites. These were easily removed.  These sites as well as the mini thoracotomy incision are very well healed. He is still having discomfort, but recently received Oxycodone from his family physician, Dr. Collene Mares.  He also takes Phenergan for nausea due to the narcotics, which he gets from Dr. Collene Mares. He will return as scheduled with a cxr.

## 2015-05-24 ENCOUNTER — Encounter: Payer: Self-pay | Admitting: *Deleted

## 2015-05-24 LAB — FUNGUS CULTURE WITH STAIN

## 2015-05-24 LAB — FUNGUS CULTURE RESULT

## 2015-05-24 LAB — FUNGAL ORGANISM REFLEX

## 2015-05-25 ENCOUNTER — Ambulatory Visit: Payer: Self-pay | Admitting: Thoracic Surgery (Cardiothoracic Vascular Surgery)

## 2015-05-31 ENCOUNTER — Other Ambulatory Visit: Payer: Self-pay | Admitting: Thoracic Surgery (Cardiothoracic Vascular Surgery)

## 2015-05-31 DIAGNOSIS — R918 Other nonspecific abnormal finding of lung field: Secondary | ICD-10-CM

## 2015-06-01 ENCOUNTER — Ambulatory Visit (INDEPENDENT_AMBULATORY_CARE_PROVIDER_SITE_OTHER): Payer: Self-pay | Admitting: Thoracic Surgery (Cardiothoracic Vascular Surgery)

## 2015-06-01 ENCOUNTER — Ambulatory Visit
Admission: RE | Admit: 2015-06-01 | Discharge: 2015-06-01 | Disposition: A | Discharge status: Home / Self Care | Payer: No Typology Code available for payment source | Source: Ambulatory Visit | Attending: Thoracic Surgery (Cardiothoracic Vascular Surgery) | Admitting: Thoracic Surgery (Cardiothoracic Vascular Surgery)

## 2015-06-01 ENCOUNTER — Encounter: Payer: Self-pay | Admitting: Thoracic Surgery (Cardiothoracic Vascular Surgery)

## 2015-06-01 VITALS — BP 150/100 | HR 88 | Resp 20 | Ht 73.0 in | Wt 243.0 lb

## 2015-06-01 DIAGNOSIS — D1432 Benign neoplasm of left bronchus and lung: Secondary | ICD-10-CM

## 2015-06-01 DIAGNOSIS — D360 Benign neoplasm of lymph nodes: Secondary | ICD-10-CM

## 2015-06-01 DIAGNOSIS — G8918 Other acute postprocedural pain: Secondary | ICD-10-CM

## 2015-06-01 DIAGNOSIS — R918 Other nonspecific abnormal finding of lung field: Secondary | ICD-10-CM

## 2015-06-01 DIAGNOSIS — J984 Other disorders of lung: Secondary | ICD-10-CM

## 2015-06-01 DIAGNOSIS — Z09 Encounter for follow-up examination after completed treatment for conditions other than malignant neoplasm: Secondary | ICD-10-CM

## 2015-06-01 MED ORDER — OXYCODONE HCL 5 MG PO TABS: 10.0000 mg | ORAL_TABLET | Freq: Four times a day (QID) | ORAL | Status: DC | PRN

## 2015-06-01 NOTE — Progress Notes (Signed)
Fernando [name]       Fernando [name],Fernando [name]             Fernando [name]       HPI: Fernando [name] turns today for scheduled postoperative follow-up visit.  He is a 55 year old man who was being worked up preoperatively prior to treatment of a perirectal abscess. A chest x-ray showed a left lower lobe nodule. A CT showed a central nodule in the left lower lobe with hilar adenopathy. Bronchoscopy and bronchial ultrasound was nondiagnostic. After reviewing options the patient opted for surgical resection for definitive diagnosis and treatment.  He underwent a thoracoscopic left lower lobectomy on 05/03/2015. We also did a mediastinal lymph node dissection. Pathology showed inflammation with giant cell formation. There were no AFB or fungal organisms. Lymph nodes were benign and showed no evidence of lymphoma.  Postoperative course was notable for shortness of breath. A CT showed no evidence of pulmonary embolus. He continues to have some incisional pain. He is requesting a refill on his oxycodone. He is not smoking. He says he feels very anxious. He is on Xanax 3 times daily. He gets short of breath with exertion.  Past Medical History  Diagnosis Date  . Hypertension 12/30/2011  . Allergy   . Peri-rectal abscess     multiple.   . Tobacco abuse   . Shortness of breath dyspnea     with exertion  . Pneumonia   . Umbilical hernia     has been repaired  . Loose stools   . Complication of anesthesia     woke up during colonoscopy  . Cancer ([place])     left lobe nodule  . Anal fissure       Current Outpatient Prescriptions  Medication Sig Dispense Refill  . acetaminophen (TYLENOL) 500 MG tablet Take 2 tablets (1,000 mg total) by mouth 3 (three) times daily as needed for mild pain (or Fever >/= 101).    Marland Kitchen albuterol (PROVENTIL HFA;VENTOLIN HFA) 108 (90 Base) MCG/ACT inhaler Inhale 2 puffs into the lungs every 6 (six) hours as needed for wheezing or shortness of breath. 1 Inhaler  12  . ALPRAZolam (XANAX) 1 MG tablet Take 1 tablet (1 mg total) by mouth 3 (three) times daily as needed for anxiety. 30 tablet 0  . budesonide-formoterol (SYMBICORT) 80-4.5 MCG/ACT inhaler Inhale 2 puffs into the lungs 2 (two) times daily. 1 Inhaler 0  . clotrimazole (LOTRIMIN) 1 % cream Apply topically 2 (two) times daily. 30 g 0  . lisinopril (PRINIVIL,ZESTRIL) 20 MG tablet Take 1 tablet (20 mg total) by mouth daily. 90 tablet 3  . oxyCODONE (OXY IR/ROXICODONE) 5 MG immediate release tablet Take 2 tablets (10 mg total) by mouth every 6 (six) hours as needed for moderate pain. 40 tablet 0   No current facility-administered medications for this visit.    Physical Exam BP 150/100 mmHg  Pulse 88  Resp 20  Ht 6\' 1"  (1.854 m)  Wt 243 lb (110.224 kg)  BMI 32.07 kg/m19  SpO42   55 year old man anxious but in no acute distress Alert and oriented 3 with no focal deficits Incisions healing well Cardiac regular rate and rhythm normal S1 and S2 Lungs managed breath sounds left base, otherwise clear  Diagnostic Tests:  I reviewed his chest x-ray. It shows postoperative changes from a left lower lobectomy.  Impression:  55 year old man who is now about a month out from a orthoscopic left lower lobectomy. Overall  think he is doing well. He used to have some incisional pain. That is not unusual this early after surgery. I gave him a prescription for another 40 tablets of oxycodone 5 mg, 1-2 tablets every 6 hours as needed for pain, no refills. He may need additional pain medication for it all said and done.  It will take some time for him to regain his exercise tolerance. He had excellent pulmonary function preoperatively so he should be able to get back to normal or near-normal when all said and done.  Tobacco abuse- he says he has not smoked since surgery. I congratulated him for that and says the importance of continued tobacco cessation.  Anxiety- I suspect this is exacerbated by his smoking  cessation. He is using Xanax.  Plan: Return in one month with chest x-ray  Fernando Nakayama, MD [facility] [phone]

## 2015-06-02 ENCOUNTER — Encounter (HOSPITAL_COMMUNITY): Payer: Self-pay | Admitting: Family Medicine

## 2015-06-02 ENCOUNTER — Emergency Department (HOSPITAL_COMMUNITY)
Admission: EM | Admit: 2015-06-02 | Discharge: 2015-06-02 | Disposition: A | Discharge status: Home / Self Care | Payer: Medicaid Other | Attending: Emergency Medicine | Admitting: Emergency Medicine

## 2015-06-02 DIAGNOSIS — I10 Essential (primary) hypertension: Secondary | ICD-10-CM | POA: Diagnosis not present

## 2015-06-02 DIAGNOSIS — Z7951 Long term (current) use of inhaled steroids: Secondary | ICD-10-CM | POA: Insufficient documentation

## 2015-06-02 DIAGNOSIS — K602 Anal fissure, unspecified: Secondary | ICD-10-CM | POA: Diagnosis not present

## 2015-06-02 DIAGNOSIS — Z85118 Personal history of other malignant neoplasm of bronchus and lung: Secondary | ICD-10-CM | POA: Insufficient documentation

## 2015-06-02 DIAGNOSIS — Z87891 Personal history of nicotine dependence: Secondary | ICD-10-CM | POA: Diagnosis not present

## 2015-06-02 DIAGNOSIS — Z9889 Other specified postprocedural states: Secondary | ICD-10-CM | POA: Insufficient documentation

## 2015-06-02 DIAGNOSIS — Z8701 Personal history of pneumonia (recurrent): Secondary | ICD-10-CM | POA: Insufficient documentation

## 2015-06-02 DIAGNOSIS — K625 Hemorrhage of anus and rectum: Secondary | ICD-10-CM | POA: Diagnosis present

## 2015-06-02 DIAGNOSIS — Z79899 Other long term (current) drug therapy: Secondary | ICD-10-CM | POA: Insufficient documentation

## 2015-06-02 DIAGNOSIS — K6289 Other specified diseases of anus and rectum: Secondary | ICD-10-CM

## 2015-06-02 DIAGNOSIS — G8929 Other chronic pain: Secondary | ICD-10-CM

## 2015-06-02 LAB — POC OCCULT BLOOD, ED: Fecal Occult Bld: POSITIVE — AB

## 2015-06-02 MED ORDER — HYDROCORTISONE ACETATE 25 MG RE SUPP
25.0000 mg | Freq: Two times a day (BID) | RECTAL | Status: DC
Start: 2015-06-02 — End: 2015-07-06

## 2015-06-02 MED ORDER — LIDOCAINE HCL 2 % EX GEL
1.0000 "application " | Freq: Once | CUTANEOUS | Status: AC
  Administered 2015-06-02: 1 via TOPICAL
  Filled 2015-06-02: qty 20

## 2015-06-02 NOTE — ED Notes (Signed)
Pt here for rectal pain and fissure. sts has been bleeding for a while.

## 2015-06-02 NOTE — Discharge Instructions (Signed)
Please follow up with [facility] or with [facility] for further management of your rectal pain.  Use Anusol as needed for your rectal discomfort.  Continue with warm bath for rectal pain.   Anal Fissure, Adult An anal fissure is a small tear or crack in the skin around the opening of the butt (anus).Bleeding from the tear or crack usually stops on its own within a few minutes. The bleeding may happen every time you poop (have a bowel movement) until the tear or crack heals. HOME CARE Eating and Drinking  Avoid bananas and dairy products. These foods can make it hard to poop.  Drink enough fluid to keep your pee (urine) clear or pale yellow.  Eat a lot of fruit, whole grains, and vegetables. General Instructions  Keep the butt area as clean and dry as you can.  Take a warm water bath (sitz bath) as told by your doctor. Do not use soap.  Take over-the-counter and prescription medicines only as told by your doctor.  Use creams or ointments only as told by your doctor.  Keep all follow-up visits as told by your doctor. This is important. GET HELP IF:  You have more bleeding.  You have a fever.  You have watery poop (diarrhea) that is mixed with blood.  You have pain.  You problem gets worse, not better.   This information is not intended to replace advice given to you by your health care provider. Make sure you discuss any questions you have with your health care provider.   Document Released: 08/24/2010 Document Revised: 09/16/2014 Document Reviewed: 03/23/2014 Elsevier Interactive Patient Education [employer].

## 2015-06-02 NOTE — ED Provider Notes (Signed)
CSN: [id number]     Arrival date & time 06/02/15  1210 History   First MD Initiated Contact with Patient 06/02/15 1613     Chief Complaint  Patient presents with  . Rectal Bleeding     (Consider location/radiation/quality/duration/timing/severity/associated sxs/prior Treatment) HPI   55 year old male with history of multiple perirectal abscess, anal fissure, history of lung cancer presenting with complaints of rectal pain and rectal bleeding. 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 He was diagnosed with having perirectal abscess and anal fissure and was treated as recent as 3 months ago with antibiotic and surgical follow-up. He was told by [facility] that he would need to have enough money upfront to have surgical intervention which he does not have the financial means due to recent diagnosis of lung cancer likely from smoking. He had a lobectomy of his left lower lung several months ago. Patient states he is finally cleared for surgical treatment of his rectal pain but does not have the means to receive adequate care for it. He continues to report daily rectal pain which he described as an soreness with occasional sharp stabbing pain to his rectum worsening with movement and with bowel movement. He was on antibiotic for this in the past without any relief. He denies having any active fever, abdominal pain, back pain, bowel bladder incontinence, saddle anesthesia. He continues to endorse small amount of frank bleeding with bowel movement and significant discomfort at his rectum. This is an ongoing problem. He is in between provider at the moment. He is trying to establish care with [facility].  Past Medical History  Diagnosis Date  . Hypertension 12/30/2011  . Allergy   . Peri-rectal abscess     multiple.   . Tobacco abuse   . Shortness of breath dyspnea     with exertion  . Pneumonia   . Umbilical hernia     has  been repaired  . Loose stools   . Complication of anesthesia     woke up during colonoscopy  . Cancer ([place])     left lobe nodule  . Anal fissure    Past Surgical History  Procedure Laterality Date  . Cervical fusion    . Facial cosmetic surgery    . Knee arthroscopy    . Appendectomy    . Hernia repair    . Spine surgery      cervical  . Cystoscopy N/A 05/01/2013    Procedure: CYSTOSCOPY;  Surgeon: Marissa Nestle, MD;  Location: [facility];  Service: Urology;  Laterality: N/A;  . Incision and drainage abscess Right 05/01/2013    Procedure: INCISION AND DRAINAGE RIGHT SCROTAL ABSCESS;  Surgeon: Marissa Nestle, MD;  Location: [facility];  Service: Urology;  Laterality: Right;  . Video bronchoscopy with endobronchial ultrasound N/A 04/22/2015    Procedure: VIDEO BRONCHOSCOPY WITH ENDOBRONCHIAL ULTRASOUND;  Surgeon: Melrose Nakayama, MD;  Location: [place];  Service: Thoracic;  Laterality: N/A;  . Colonoscopy    . Incision and drainage perirectal abscess    . Video assisted thoracoscopy (vats)/ lobectomy Left 05/03/2015    Procedure: LEFT VIDEO ASSISTED THORACOSCOPY (VATS)/ LEFT LOWER LOBECTOMY, ON-Q CATHETER PLACEMENT;  Surgeon: Melrose Nakayama, MD;  Location: [place];  Service: Thoracic;  Laterality: Left;   Family History  Problem Relation Age of Onset  . Breast cancer Mother    Social History  Substance Use Topics  .  Smoking status: Former Smoker -- 0.00 packs/day for 15 years    Types: Cigarettes    Quit date: 03/05/2015  . Smokeless tobacco: Never Used  . Alcohol Use: No     Comment: occ    Review of Systems  All other systems reviewed and are negative.     Allergies  Morphine and related  Home Medications   Prior to Admission medications   Medication Sig Start Date End Date Taking? Authorizing Provider  acetaminophen (TYLENOL) 500 MG tablet Take 2 tablets (1,000 mg total) by mouth 3 (three) times daily as needed for mild pain (or Fever >/= 101). 05/14/15    Shanker Kristeen Mans, MD  albuterol (PROVENTIL HFA;VENTOLIN HFA) 108 (90 Base) MCG/ACT inhaler Inhale 2 puffs into the lungs every 6 (six) hours as needed for wheezing or shortness of breath. 03/12/15   Ripudeep Krystal Eaton, MD  ALPRAZolam Duanne Moron) 1 MG tablet Take 1 tablet (1 mg total) by mouth 3 (three) times daily as needed for anxiety. 03/12/15   Ripudeep Krystal Eaton, MD  budesonide-formoterol (SYMBICORT) 80-4.5 MCG/ACT inhaler Inhale 2 puffs into the lungs 2 (two) times daily. 05/14/15   Shanker Kristeen Mans, MD  clotrimazole (LOTRIMIN) 1 % cream Apply topically 2 (two) times daily. 05/14/15   Shanker Kristeen Mans, MD  lisinopril (PRINIVIL,ZESTRIL) 20 MG tablet Take 1 tablet (20 mg total) by mouth daily. 05/17/15   Maren Reamer, MD  oxyCODONE (OXY IR/ROXICODONE) 5 MG immediate release tablet Take 2 tablets (10 mg total) by mouth every 6 (six) hours as needed for moderate pain. 06/01/15   Melrose Nakayama, MD   BP 111/81 mmHg  Pulse 89  Temp(Src) 98.4 F (36.9 C) (Oral)  Resp 16  SpO2 99% Physical Exam  Constitutional: He appears well-developed and well-nourished. No distress.  HENT:  Head: Atraumatic.  Eyes: Conjunctivae are normal.  Neck: Neck supple.  Abdominal: Soft. There is no tenderness.  Genitourinary:  Chaperone present during exam. Evidence of anal fissure noted to the left gluteal region and significant tenderness to manipulation of the rectum. Exam is limited due to patient discomfort. No obvious abscess appreciable on our limited exam. Hemoccult positive.  Neurological: He is alert.  Skin: No rash noted.  Psychiatric: He has a normal mood and affect.  Nursing note and vitals reviewed.   ED Course  Procedures (including critical care time) Labs Review Labs Reviewed  POC OCCULT BLOOD, ED - Abnormal; Notable for the following:    Fecal Occult Bld POSITIVE (*)    All other components within normal limits    Imaging Review Dg Chest 2 View  06/01/2015  CLINICAL DATA:  Lung mass.  Vats  05/03/2015.  Shortness of breath. EXAM: CHEST  2 VIEW COMPARISON:  05/12/2015 FINDINGS: Postoperative changes on the left. Elevation of the left hemidiaphragm with mild residual left base atelectasis, improved since prior study. Right lung is clear. Heart is normal size. No effusions or pneumothorax. No acute bony abnormality. IMPRESSION: Elevated left hemidiaphragm with mild residual left base atelectasis, improved since prior study. Electronically Signed   By: Rolm Baptise M.D.   On: 06/01/2015 11:20   I have personally reviewed and evaluated these images and lab results as part of my medical decision-making.   EKG Interpretation None      MDM   Final diagnoses:  Rectal pain, chronic  Anal fissure    BP 106/80 mmHg  Pulse 77  Temp(Src) 97.7 F (36.5 C) (Oral)  Resp 19  SpO2 95%  4:42 PM Patient here with persistent rectal pain secondary to his known diagnosis of a fissure and perirectal abscess. He is afebrile with stable normal vital sign. He does not have any abdominal discomfort however he does have significant discomfort on digital rectal exam. His perineum is soft, low suspicion for Fournier gangrene. He will be best managed by [facility] surgery however he patient states he does not have the funds to follow-up. I will discuss this with our case manager to see if we can provide any help or assistance.  6:34 PM Mariann Laster, our case manager is well aware of this patient and have been helping him with outpt f/u for similar complaint in the past.  She encourage pt to f/u with [facility] for further outpt needs.  Pt will be discharge with [place] for comfort.  Return precaution discussed.    Domenic Moras, PA-C 06/02/15 1835  Gareth Morgan, MD 06/04/15 1452

## 2015-06-02 NOTE — ED Notes (Signed)
Pt. Came up to the desk to ask how much longer, apologized for the wait and reassured pt. That we will be getting him back as soon as we can

## 2015-06-02 NOTE — Care Management (Signed)
ED CM met with patient at bedside to discuss follow up care. Patient reports needing rectal surgery but not having insurance. He spoke with [facility] and was told that he would need to have enough money upfront to have surgical intervention.  CM explained that [facility] is private practice and office policies are set by their office. Patient verbalized understanding, Patient encouraged to continue to follow up with [place].  Patient verbalized understanding, teach back done. No further questions verbalized.

## 2015-06-10 DIAGNOSIS — K603 Anal fistula, unspecified: Secondary | ICD-10-CM

## 2015-06-10 HISTORY — DX: Anal fistula, unspecified: K60.30

## 2015-06-10 HISTORY — DX: Anal fistula: K60.3

## 2015-06-17 ENCOUNTER — Other Ambulatory Visit: Payer: Self-pay | Admitting: General Surgery

## 2015-06-18 ENCOUNTER — Encounter (HOSPITAL_BASED_OUTPATIENT_CLINIC_OR_DEPARTMENT_OTHER): Payer: Self-pay | Admitting: *Deleted

## 2015-06-18 NOTE — Progress Notes (Signed)
Pt instructed npo p mn 6/13 x xanax, symbicort. Pt to bring albuterol inhaler with him. To [facility] 6/14 @ 0930.  Needs istat on arrival. Pt aware to do hibiclens shower hs and am of surgery.

## 2015-06-23 ENCOUNTER — Ambulatory Visit (HOSPITAL_BASED_OUTPATIENT_CLINIC_OR_DEPARTMENT_OTHER): Payer: Medicaid Other | Admitting: Anesthesiology

## 2015-06-23 ENCOUNTER — Encounter (HOSPITAL_BASED_OUTPATIENT_CLINIC_OR_DEPARTMENT_OTHER)
Admission: RE | Disposition: A | Discharge status: Home / Self Care | Payer: Self-pay | Source: Ambulatory Visit | Attending: General Surgery

## 2015-06-23 ENCOUNTER — Ambulatory Visit (HOSPITAL_BASED_OUTPATIENT_CLINIC_OR_DEPARTMENT_OTHER)
Admission: RE | Admit: 2015-06-23 | Discharge: 2015-06-23 | Disposition: A | Discharge status: Home / Self Care | Payer: Medicaid Other | Source: Ambulatory Visit | Attending: General Surgery | Admitting: General Surgery

## 2015-06-23 ENCOUNTER — Encounter (HOSPITAL_BASED_OUTPATIENT_CLINIC_OR_DEPARTMENT_OTHER): Payer: Self-pay | Admitting: Anesthesiology

## 2015-06-23 DIAGNOSIS — I10 Essential (primary) hypertension: Secondary | ICD-10-CM | POA: Diagnosis not present

## 2015-06-23 DIAGNOSIS — K603 Anal fistula: Secondary | ICD-10-CM | POA: Insufficient documentation

## 2015-06-23 DIAGNOSIS — Z87891 Personal history of nicotine dependence: Secondary | ICD-10-CM | POA: Insufficient documentation

## 2015-06-23 DIAGNOSIS — D1432 Benign neoplasm of left bronchus and lung: Secondary | ICD-10-CM

## 2015-06-23 DIAGNOSIS — R918 Other nonspecific abnormal finding of lung field: Secondary | ICD-10-CM

## 2015-06-23 DIAGNOSIS — Z79899 Other long term (current) drug therapy: Secondary | ICD-10-CM | POA: Insufficient documentation

## 2015-06-23 DIAGNOSIS — D360 Benign neoplasm of lymph nodes: Secondary | ICD-10-CM

## 2015-06-23 DIAGNOSIS — Z7951 Long term (current) use of inhaled steroids: Secondary | ICD-10-CM | POA: Diagnosis not present

## 2015-06-23 DIAGNOSIS — Z09 Encounter for follow-up examination after completed treatment for conditions other than malignant neoplasm: Secondary | ICD-10-CM

## 2015-06-23 DIAGNOSIS — G8918 Other acute postprocedural pain: Secondary | ICD-10-CM

## 2015-06-23 HISTORY — PX: EVALUATION UNDER ANESTHESIA WITH FISTULECTOMY: SHX5623

## 2015-06-23 HISTORY — DX: Other nonspecific abnormal finding of lung field: R91.8

## 2015-06-23 LAB — POCT I-STAT, CHEM 8
BUN: 9 mg/dL (ref 6–20)
CHLORIDE: 101 mmol/L (ref 101–111)
CREATININE: 1 mg/dL (ref 0.61–1.24)
Calcium, Ion: 1.16 mmol/L (ref 1.12–1.23)
Glucose, Bld: 101 mg/dL — ABNORMAL HIGH (ref 65–99)
HCT: 43 % (ref 39.0–52.0)
HEMOGLOBIN: 14.6 g/dL (ref 13.0–17.0)
POTASSIUM: 3.7 mmol/L (ref 3.5–5.1)
Sodium: 140 mmol/L (ref 135–145)
TCO2: 28 mmol/L (ref 0–100)

## 2015-06-23 SURGERY — EXAM UNDER ANESTHESIA WITH FISTULECTOMY
Anesthesia: Monitor Anesthesia Care | Site: Rectum

## 2015-06-23 MED ORDER — MIDAZOLAM HCL 5 MG/5ML IJ SOLN
INTRAMUSCULAR | Status: DC | PRN
  Administered 2015-06-23: 2 mg via INTRAVENOUS

## 2015-06-23 MED ORDER — OXYCODONE HCL 5 MG PO TABS
5.0000 mg | ORAL_TABLET | ORAL | Status: DC | PRN
  Administered 2015-06-23: 5 mg via ORAL
  Filled 2015-06-23: qty 2

## 2015-06-23 MED ORDER — OXYCODONE HCL 5 MG PO TABS
ORAL_TABLET | ORAL | Status: AC
  Filled 2015-06-23: qty 1

## 2015-06-23 MED ORDER — BUPIVACAINE-EPINEPHRINE 0.5% -1:200000 IJ SOLN
INTRAMUSCULAR | Status: DC | PRN
  Administered 2015-06-23: 30 mL

## 2015-06-23 MED ORDER — SODIUM CHLORIDE 0.9 % IV SOLN
250.0000 mg | INTRAVENOUS | Status: DC | PRN
  Administered 2015-06-23: 10 ug/kg/min via INTRAVENOUS

## 2015-06-23 MED ORDER — ACETAMINOPHEN 650 MG RE SUPP
650.0000 mg | RECTAL | Status: DC | PRN
  Filled 2015-06-23: qty 1

## 2015-06-23 MED ORDER — ONDANSETRON HCL 4 MG/2ML IJ SOLN
INTRAMUSCULAR | Status: DC | PRN
  Administered 2015-06-23: 4 mg via INTRAVENOUS

## 2015-06-23 MED ORDER — KETAMINE HCL 10 MG/ML IJ SOLN
INTRAMUSCULAR | Status: AC
  Filled 2015-06-23: qty 1

## 2015-06-23 MED ORDER — LIDOCAINE HCL (CARDIAC) 20 MG/ML IV SOLN
INTRAVENOUS | Status: DC | PRN
  Administered 2015-06-23: 100 mg via INTRAVENOUS

## 2015-06-23 MED ORDER — PROPOFOL 500 MG/50ML IV EMUL
INTRAVENOUS | Status: DC | PRN
  Administered 2015-06-23: 200 ug/kg/min via INTRAVENOUS

## 2015-06-23 MED ORDER — SODIUM CHLORIDE 0.9% FLUSH
3.0000 mL | INTRAVENOUS | Status: DC | PRN
  Filled 2015-06-23: qty 3

## 2015-06-23 MED ORDER — OXYCODONE HCL 5 MG PO TABS: 5.0000 mg | ORAL_TABLET | Freq: Four times a day (QID) | ORAL | Status: DC | PRN

## 2015-06-23 MED ORDER — ACETAMINOPHEN 325 MG PO TABS
650.0000 mg | ORAL_TABLET | ORAL | Status: DC | PRN
  Filled 2015-06-23: qty 2

## 2015-06-23 MED ORDER — FENTANYL CITRATE (PF) 100 MCG/2ML IJ SOLN
INTRAMUSCULAR | Status: DC | PRN
  Administered 2015-06-23: 50 ug via INTRAVENOUS

## 2015-06-23 MED ORDER — LACTATED RINGERS IV SOLN
INTRAVENOUS | Status: DC
  Administered 2015-06-23 (×2): via INTRAVENOUS
  Filled 2015-06-23: qty 1000

## 2015-06-23 MED ORDER — PROMETHAZINE HCL 25 MG/ML IJ SOLN
6.2500 mg | INTRAMUSCULAR | Status: DC | PRN
Start: 2015-06-23 — End: 2015-06-23
  Filled 2015-06-23: qty 1

## 2015-06-23 MED ORDER — SODIUM CHLORIDE 0.9% FLUSH
3.0000 mL | Freq: Two times a day (BID) | INTRAVENOUS | Status: DC
  Filled 2015-06-23: qty 3

## 2015-06-23 MED ORDER — SODIUM CHLORIDE 0.9 % IV SOLN
250.0000 mL | INTRAVENOUS | Status: DC | PRN
  Filled 2015-06-23: qty 250

## 2015-06-23 MED ORDER — FENTANYL CITRATE (PF) 100 MCG/2ML IJ SOLN
25.0000 ug | INTRAMUSCULAR | Status: DC | PRN
  Filled 2015-06-23: qty 1

## 2015-06-23 MED ORDER — KETOROLAC TROMETHAMINE 30 MG/ML IJ SOLN
INTRAMUSCULAR | Status: DC | PRN
  Administered 2015-06-23: 30 mg via INTRAVENOUS

## 2015-06-23 SURGICAL SUPPLY — 65 items
APL SKNCLS STERI-STRIP NONHPOA (GAUZE/BANDAGES/DRESSINGS) ×2
BENZOIN TINCTURE PRP APPL 2/3 (GAUZE/BANDAGES/DRESSINGS) ×4 IMPLANT
BLADE 15 SAFETY STRL DISP (BLADE) ×4 IMPLANT
BLADE HEX COATED 2.75 (ELECTRODE) ×4 IMPLANT
BLADE SURG 10 STRL SS (BLADE) IMPLANT
BLADE SURG 15 STRL LF DISP TIS (BLADE) ×2 IMPLANT
BLADE SURG 15 STRL SS (BLADE) ×4
BRIEF STRETCH FOR OB PAD LRG (UNDERPADS AND DIAPERS) ×8 IMPLANT
CANISTER SUCTION 2500CC (MISCELLANEOUS) ×4 IMPLANT
COVER BACK TABLE 60X90IN (DRAPES) ×4 IMPLANT
COVER MAYO STAND STRL (DRAPES) ×4 IMPLANT
DECANTER SPIKE VIAL GLASS SM (MISCELLANEOUS) ×4 IMPLANT
DRAPE LAPAROTOMY 100X72 PEDS (DRAPES) ×4 IMPLANT
DRAPE LG THREE QUARTER DISP (DRAPES) IMPLANT
DRAPE UTILITY XL STRL (DRAPES) ×4 IMPLANT
DRSG PAD ABDOMINAL 8X10 ST (GAUZE/BANDAGES/DRESSINGS) ×3 IMPLANT
ELECT BLADE 6.5 .24CM SHAFT (ELECTRODE) ×4 IMPLANT
ELECT REM PT RETURN 9FT ADLT (ELECTROSURGICAL) ×4
ELECTRODE REM PT RTRN 9FT ADLT (ELECTROSURGICAL) ×2 IMPLANT
GAUZE SPONGE 4X4 16PLY XRAY LF (GAUZE/BANDAGES/DRESSINGS) ×1 IMPLANT
GAUZE VASELINE 3X9 (GAUZE/BANDAGES/DRESSINGS) IMPLANT
GLOVE BIO SURGEON STRL SZ 6.5 (GLOVE) ×3 IMPLANT
GLOVE BIO SURGEONS STRL SZ 6.5 (GLOVE) ×1
GLOVE BIOGEL PI IND STRL 7.5 (GLOVE) ×3 IMPLANT
GLOVE BIOGEL PI INDICATOR 7.5 (GLOVE) ×6
GLOVE INDICATOR 7.0 STRL GRN (GLOVE) ×4 IMPLANT
GOWN STRL REUS W/ TWL LRG LVL3 (GOWN DISPOSABLE) ×1 IMPLANT
GOWN STRL REUS W/ TWL XL LVL3 (GOWN DISPOSABLE) ×2 IMPLANT
GOWN STRL REUS W/TWL 2XL LVL3 (GOWN DISPOSABLE) ×5 IMPLANT
GOWN STRL REUS W/TWL LRG LVL3 (GOWN DISPOSABLE) ×3 IMPLANT
GOWN STRL REUS W/TWL XL LVL3 (GOWN DISPOSABLE) ×4
HYDROGEN PEROXIDE 16OZ (MISCELLANEOUS) IMPLANT
KIT ROOM TURNOVER WOR (KITS) ×4 IMPLANT
LOOP VESSEL MAXI BLUE (MISCELLANEOUS) ×4 IMPLANT
MANIFOLD NEPTUNE II (INSTRUMENTS) IMPLANT
NDL HYPO 25X1 1.5 SAFETY (NEEDLE) ×1 IMPLANT
NDL SAFETY ECLIPSE 18X1.5 (NEEDLE) IMPLANT
NEEDLE HYPO 18GX1.5 SHARP (NEEDLE)
NEEDLE HYPO 22GX1.5 SAFETY (NEEDLE) ×4 IMPLANT
NEEDLE HYPO 25X1 1.5 SAFETY (NEEDLE) ×4 IMPLANT
NS IRRIG 500ML POUR BTL (IV SOLUTION) ×4 IMPLANT
PACK BASIN DAY SURGERY FS (CUSTOM PROCEDURE TRAY) ×4 IMPLANT
PAD ABD 8X10 STRL (GAUZE/BANDAGES/DRESSINGS) ×4 IMPLANT
PAD ARMBOARD 7.5X6 YLW CONV (MISCELLANEOUS) ×4 IMPLANT
PENCIL BUTTON HOLSTER BLD 10FT (ELECTRODE) ×4 IMPLANT
SPONGE GAUZE 4X4 12PLY STER LF (GAUZE/BANDAGES/DRESSINGS) ×4 IMPLANT
SPONGE SURGIFOAM ABS GEL 100 (HEMOSTASIS) IMPLANT
SPONGE SURGIFOAM ABS GEL 12-7 (HEMOSTASIS) IMPLANT
STAPLER PROXIMATE HCS (STAPLE) IMPLANT
STAPLER VISISTAT 35W (STAPLE) ×4 IMPLANT
SUT CHROMIC 2 0 SH (SUTURE) IMPLANT
SUT CHROMIC 3 0 SH 27 (SUTURE) IMPLANT
SUT ETHIBOND 0 (SUTURE) IMPLANT
SUT GUT CHROMIC 3 0 (SUTURE) IMPLANT
SUT PROLENE 2 0 BLUE (SUTURE) IMPLANT
SUT VIC AB 4-0 P-3 18XBRD (SUTURE) IMPLANT
SUT VIC AB 4-0 P3 18 (SUTURE)
SUT VIC AB 4-0 SH 18 (SUTURE) IMPLANT
SYR CONTROL 10ML LL (SYRINGE) ×4 IMPLANT
TOWEL OR 17X24 6PK STRL BLUE (TOWEL DISPOSABLE) ×4 IMPLANT
TRAY DSU PREP LF (CUSTOM PROCEDURE TRAY) ×4 IMPLANT
TUBE CONNECTING 12'X1/4 (SUCTIONS) ×1
TUBE CONNECTING 12X1/4 (SUCTIONS) ×3 IMPLANT
WATER STERILE IRR 500ML POUR (IV SOLUTION) ×4 IMPLANT
YANKAUER SUCT BULB TIP NO VENT (SUCTIONS) ×4 IMPLANT

## 2015-06-23 NOTE — Anesthesia Preprocedure Evaluation (Addendum)
Anesthesia Evaluation  Patient identified by MRN, date of birth, ID band Patient awake    Reviewed: Allergy & Precautions, NPO status , Patient's Chart, lab work & pertinent test results  History of Anesthesia Complications (+) history of anesthetic complications  Airway Mallampati: III  TM Distance: >3 FB Neck ROM: Full    Dental no notable dental hx. (+) Teeth Intact, Dental Advisory Given   Pulmonary shortness of breath, pneumonia, resolved, former smoker,  CXR: 5-23-17FINDINGS: Postoperative changes on the left. Elevation of the left hemidiaphragm with mild residual left base atelectasis, improved since prior study. Right lung is clear. Heart is normal size. No effusions or pneumothorax. No acute bony abnormality.  IMPRESSION: Elevated left hemidiaphragm with mild residual left base atelectasis, improved since prior study.   Pulmonary exam normal breath sounds clear to auscultation       Cardiovascular hypertension, Pt. on medications Normal cardiovascular exam Rhythm:Regular Rate:Normal     Neuro/Psych negative neurological ROS  negative psych ROS   GI/Hepatic negative GI ROS, Neg liver ROS,   Endo/Other  negative endocrine ROS  Renal/GU negative Renal ROS  negative genitourinary   Musculoskeletal negative musculoskeletal ROS (+)   Abdominal   Peds negative pediatric ROS (+)  Hematology negative hematology ROS (+)   Anesthesia Other Findings   Reproductive/Obstetrics negative OB ROS                          Anesthesia Physical Anesthesia Plan  ASA: II  Anesthesia Plan: MAC   Post-op Pain Management:    Induction: Intravenous  Airway Management Planned: Nasal Cannula  Additional Equipment:   Intra-op Plan:   Post-operative Plan:   Informed Consent: I have reviewed the patients History and Physical, chart, labs and discussed the procedure including the risks,  benefits and alternatives for the proposed anesthesia with the patient or authorized representative who has indicated his/her understanding and acceptance.   Dental advisory given  Plan Discussed with: CRNA and Anesthesiologist  Anesthesia Plan Comments:        Anesthesia Quick Evaluation

## 2015-06-23 NOTE — H&P (Signed)
History of Present Illness  The patient is a 55 year old male who presents with anal fistula. 55 year old male who presents to the office with a chronically draining perirectal lesion. This has been going on for several weeks. He states that it drains bloody and purulent discharge. He was seen in the emergency Department last week for a perirectal abscess. He was given antibiotics and this has cleared up most of the symptoms that he continues to have drainage and occasional pain. He is having regular bowel movements but does notice some blood with his stool. Patient has a history of a large perirectal abscess that was drained in 1998.   Other ProblemsHigh blood pressure Lung Mass: s/p resection  Past Surgical History  Anal Fissure Repair Appendectomy  Diagnostic Studies History  Colonoscopy 5-10 years ago  Allergies  Morphine Sulfate ER *ANALGESICS - OPIOID*  Medication History  Albuterol Sulfate HFA (108 (90 Base)MCG/ACT Aerosol Soln, Inhalation) Active. Xanax (1MG  Tablet, Oral) Active. Symbicort (80-4.5MCG/ACT Aerosol, Inhalation) Active. Levaquin (500MG  Tablet, Oral) Active. MetroNIDAZOLE (500MG  Tablet, Oral) Active. Promethazine HCl (25MG  Tablet, Oral) Active. Florastor (250MG  Capsule, Oral) Active. Medications Reconciled  Social History  Alcohol use Occasional alcohol use. Caffeine use Carbonated beverages. Tobacco use Former smoker.  Family History  Arthritis Mother. Breast Cancer Mother. Hypertension Mother.   Review of Systems  General Not Present- Appetite Loss, Chills, Fatigue, Fever, Night Sweats, Weight Gain and Weight Loss. Skin Not Present- Change in Wart/Mole, Dryness, Hives, Jaundice, New Lesions, Non-Healing Wounds, Rash and Ulcer. HEENT Not Present- Earache, Hearing Loss, Hoarseness, Nose Bleed, Oral Ulcers, Ringing in the Ears, Seasonal Allergies, Sinus Pain, Sore Throat, Visual Disturbances, Wears glasses/contact lenses and  Yellow Eyes. Respiratory Not Present- Bloody sputum, Chronic Cough, Difficulty Breathing, Snoring and Wheezing. Breast Not Present- Breast Mass, Breast Pain, Nipple Discharge and Skin Changes. Cardiovascular Present- Shortness of Breath. Not Present- Chest Pain, Difficulty Breathing Lying Down, Leg Cramps, Palpitations, Rapid Heart Rate and Swelling of Extremities. Gastrointestinal Present- Chronic diarrhea. Not Present- Abdominal Pain, Bloating, Bloody Stool, Change in Bowel Habits, Constipation, Difficulty Swallowing, Excessive gas, Gets full quickly at meals, Hemorrhoids, Indigestion, Nausea, Rectal Pain and Vomiting. Male Genitourinary Not Present- Frequency, Nocturia, Painful Urination, Pelvic Pain and Urgency. Musculoskeletal Not Present- Back Pain, Joint Pain, Joint Stiffness, Muscle Pain, Muscle Weakness and Swelling of Extremities. Neurological Not Present- Decreased Memory, Fainting, Headaches, Numbness, Seizures, Tingling, Tremor, Trouble walking and Weakness. Psychiatric Not Present- Anxiety, Bipolar, Change in Sleep Pattern, Depression, Fearful and Frequent crying. Hematology Not Present- Easy Bruising, Excessive bleeding, Gland problems, HIV and Persistent Infections.  BP 141/104 mmHg  Pulse 78  Temp(Src) 97.8 F (36.6 C) (Oral)  Resp 16  Ht 6\' 2"  (1.88 m)  Wt 116.121 kg (256 lb)  BMI 32.85 kg/m2  SpO2 97%  Physical Exam  General Mental Status-Alert. General Appearance-Not in acute distress. Build & Nutrition-Well nourished. Posture-Normal posture. Gait-Normal.  Head and Neck Head-normocephalic, atraumatic with no lesions or palpable masses. Trachea-midline. Thyroid Gland Characteristics - normal size and consistency and no palpable nodules.  Chest and Lung Exam Chest and lung exam reveals -on auscultation, normal breath sounds, no adventitious sounds and normal vocal resonance.  Cardiovascular Cardiovascular examination reveals -normal heart  sounds, regular rate and rhythm with no murmurs.  Abdomen Inspection Inspection of the abdomen reveals - No Hernias. Palpation/Percussion Palpation and Percussion of the abdomen reveal - Soft, Non Tender, No Rigidity (guarding), No hepatosplenomegaly and No Palpable abdominal masses.  Rectal Anorectal Exam External - Note: 2 small  areas at posterior midline draining purulence, no fluctuant masses.  Neurologic Neurologic evaluation reveals -alert and oriented x 3 with no impairment of recent or remote memory, normal attention span and ability to concentrate, normal sensation and normal coordination.  Musculoskeletal Normal Exam - Bilateral-Upper Extremity Strength Normal and Lower Extremity Strength Normal.    Assessment & Plan   ANAL FISTULA (K60.3) Impression: 55 year old male who presents to the office following ED visit for large perirectal abscess. He was given antibiotics and most of his symptoms have resolved. He continues down. The drainage on a daily basis. He has a history of an abscess in 1998 as well. On exam he has 2 openings at posterior midline that appeared to be extruding purulence. There is no fluctuant masses. I believe this represents a fistula. I have recommended an exam under anesthesia with possible fistulotomy versus seton placement. Risk include bleeding and some minimal risk of incontinence a fistulotomy is performed. I believe the patient understands this and has agreed to proceed.

## 2015-06-23 NOTE — Op Note (Signed)
06/23/2015  10:11 AM  PATIENT:  Fernando [name]  55 y.o. male  Patient Care Team: Cory Munch, PA-C as PCP - General (Physician Assistant)  PRE-OPERATIVE DIAGNOSIS:  anal fistula  POST-OPERATIVE DIAGNOSIS:  anal fistula  PROCEDURE: ANAL EXAM UNDER ANESTHESIA,  FISTULOTOMY    Surgeon(s): Leighton Ruff, MD  ASSISTANT: none   ANESTHESIA:   local and MAC  SPECIMEN:  No Specimen  DISPOSITION OF SPECIMEN:  N/A  COUNTS:  YES  PLAN OF CARE: Discharge to home after PACU  PATIENT DISPOSITION:  PACU - hemodynamically stable.  INDICATION: 55 y.o. M with persistent perianal drainage consistent with fistula   OR FINDINGS: R posterior fistula involving the intersphincteric space  DESCRIPTION: the patient was identified in the preoperative holding area and taken to the OR where they were laid on the operating room table.  MAC anesthesia was induced without difficulty. The patient was then positioned in prone jackknife position with buttocks gently taped apart.  The patient was then prepped and draped in usual sterile fashion.  SCDs were noted to be in place prior to the initiation of anesthesia. A surgical timeout was performed indicating the correct patient, procedure, positioning and need for preoperative antibiotics.  A rectal block was performed using Marcaine with epinephrine.    I began with a digital rectal exam.  The patient had increased rectal tone but no other signs of pathology.  I then placed a Hill-Ferguson anoscope into the anal canal and evaluated this completely.  There was an area radial to the external fistula opening that appeared to be the internal opening. I placed an S-shaped fistula probe into the external opening and this exited through the internal opening that was identified. The fistula tract appeared to only involve internal sphincter. I decided to perform a fistulotomy. This was done using electrocautery. The edges of the fistula tract were marsupialized using a  3-0 chromic running suture. Hemostasis was good at the end of the case. All counts were correct per operating room staff. The patient tolerated this well was sent to the post anesthesia care unit in stable condition.

## 2015-06-23 NOTE — Anesthesia Postprocedure Evaluation (Signed)
Anesthesia Post Note  Patient: Fernando [name]  Procedure(s) Performed: Procedure(s) (LRB): ANAL EXAM UNDER ANESTHESIA  FISTULOTOMY (N/A)  Patient location during evaluation: PACU Anesthesia Type: MAC Level of consciousness: awake and alert Pain management: pain level controlled Vital Signs Assessment: post-procedure vital signs reviewed and stable Respiratory status: spontaneous breathing, nonlabored ventilation, respiratory function stable and patient connected to nasal cannula oxygen Cardiovascular status: stable and blood pressure returned to baseline Anesthetic complications: no    Last Vitals:  Filed Vitals:   06/23/15 0907 06/23/15 1022  BP: 141/104 130/96  Pulse: 78 78  Temp: 36.6 C 36.6 C  Resp: 16 19    Last Pain:  Filed Vitals:   06/23/15 1040  PainSc: 6                  Jeremiah Curci J

## 2015-06-23 NOTE — Transfer of Care (Signed)
Immediate Anesthesia Transfer of Care Note  Patient: Fernando [name]  Procedure(s) Performed: Procedure(s): ANAL EXAM UNDER ANESTHESIA  FISTULOTOMY (N/A)  Patient Location: PACU  Anesthesia Type:MAC  Level of Consciousness: awake, alert , oriented and patient cooperative  Airway & Oxygen Therapy: Patient Spontanous Breathing and Patient connected to nasal cannula oxygen  Post-op Assessment: Report given to RN and Post -op Vital signs reviewed and stable  Post vital signs: Reviewed and stable  Last Vitals:  Filed Vitals:   06/23/15 0907  BP: 141/104  Pulse: 78  Temp: 36.6 C  Resp: 16    Last Pain:  Filed Vitals:   06/23/15 0908  PainSc: 6       Patients Stated Pain Goal: 7 ([id number])  Complications: No apparent anesthesia complications

## 2015-06-23 NOTE — Discharge Instructions (Addendum)

## 2015-06-23 NOTE — Anesthesia Procedure Notes (Signed)
Procedure Name: MAC Date/Time: 06/23/2015 9:45 AM Performed by: Wanita Chamberlain Pre-anesthesia Checklist: Patient identified, Timeout performed, Emergency Drugs available, Suction available and Patient being monitored Patient Re-evaluated:Patient Re-evaluated prior to inductionOxygen Delivery Method: Nasal cannula Preoxygenation: Pre-oxygenation with 100% oxygen Intubation Type: IV induction Placement Confirmation: positive ETCO2 Dental Injury: Teeth and Oropharynx as per pre-operative assessment

## 2015-06-24 ENCOUNTER — Encounter (HOSPITAL_BASED_OUTPATIENT_CLINIC_OR_DEPARTMENT_OTHER): Payer: Self-pay | Admitting: General Surgery

## 2015-07-05 ENCOUNTER — Other Ambulatory Visit: Payer: Self-pay | Admitting: Thoracic Surgery (Cardiothoracic Vascular Surgery)

## 2015-07-05 DIAGNOSIS — R911 Solitary pulmonary nodule: Secondary | ICD-10-CM

## 2015-07-06 ENCOUNTER — Encounter: Payer: Self-pay | Admitting: Thoracic Surgery (Cardiothoracic Vascular Surgery)

## 2015-07-06 ENCOUNTER — Ambulatory Visit
Admission: RE | Admit: 2015-07-06 | Discharge: 2015-07-06 | Disposition: A | Discharge status: Home / Self Care | Payer: Medicaid Other | Source: Ambulatory Visit | Attending: Thoracic Surgery (Cardiothoracic Vascular Surgery) | Admitting: Thoracic Surgery (Cardiothoracic Vascular Surgery)

## 2015-07-06 ENCOUNTER — Ambulatory Visit (INDEPENDENT_AMBULATORY_CARE_PROVIDER_SITE_OTHER): Payer: Self-pay | Admitting: Thoracic Surgery (Cardiothoracic Vascular Surgery)

## 2015-07-06 VITALS — BP 138/101 | HR 108 | Resp 18 | Ht 73.0 in | Wt 243.0 lb

## 2015-07-06 DIAGNOSIS — D360 Benign neoplasm of lymph nodes: Secondary | ICD-10-CM

## 2015-07-06 DIAGNOSIS — Z09 Encounter for follow-up examination after completed treatment for conditions other than malignant neoplasm: Secondary | ICD-10-CM

## 2015-07-06 DIAGNOSIS — R911 Solitary pulmonary nodule: Secondary | ICD-10-CM

## 2015-07-06 DIAGNOSIS — D1432 Benign neoplasm of left bronchus and lung: Secondary | ICD-10-CM

## 2015-07-06 NOTE — Progress Notes (Signed)
[address]       [place] [address]             [phone]       HPI: Fernando [name] returns for a scheduled follow-up visit.  Fernando [name] is a 55 year old man who was being worked up prior to treatment of a perirectal abscess. A chest x-ray showed a left lower lobe nodule. CT showed a central nodule in the left lower lobe with hilar adenopathy. The nodule was hypermetabolic by PET as well as the hilar lymph node. Bronchoscopy and bronchial ultrasound were nondiagnostic. After reviewing options Fernando [name] opted for surgical resection for definitive diagnosis and treatment.  He underwent a thoracoscopic left lower lobectomy with mediastinal lymph node dissectionon 05/03/2015. Pathology showed inflammation with giant cell formation. There were no AFB or fungal organisms. Lymph nodes were benign and showed no evidence of lymphoma. No tumor was seen.  I saw him in the office on 06/01/2015. That time he was still having a lot of pain and also is very anxious. He complained of feeling short of breath.  In the interim since his last visit he had an anal fistulotomy done. He states he is still having some pain and some drainage there. His chest wall pain has improved dramatically. He still feel short of breath with exertion but not at all at rest and overall he thinks his breathing is improved.  He did quit smoking postoperatively and has not smoked since.  Past Medical History  Diagnosis Date  . Hypertension 12/30/2011  . Allergy   . Peri-rectal abscess     multiple.   . Tobacco abuse   . Shortness of breath dyspnea     with exertion  . Pneumonia   . Umbilical hernia     has been repaired  . Loose stools   . Complication of anesthesia     woke up during colonoscopy  . Anal fissure   . Lung mass     hx of left lower benign mass     Current Outpatient Prescriptions  Medication Sig Dispense Refill  . acetaminophen (TYLENOL) 500 MG tablet Take 2 tablets (1,000 mg  total) by mouth 3 (three) times daily as needed for mild pain (or Fever >/= 101).    Marland Kitchen albuterol (PROVENTIL HFA;VENTOLIN HFA) 108 (90 Base) MCG/ACT inhaler Inhale 2 puffs into the lungs every 6 (six) hours as needed for wheezing or shortness of breath. 1 Inhaler 12  . ALPRAZolam (XANAX) 1 MG tablet Take 1 tablet (1 mg total) by mouth 3 (three) times daily as needed for anxiety. 30 tablet 0  . budesonide-formoterol (SYMBICORT) 80-4.5 MCG/ACT inhaler Inhale 2 puffs into the lungs 2 (two) times daily. 1 Inhaler 0  . lisinopril (PRINIVIL,ZESTRIL) 20 MG tablet Take 1 tablet (20 mg total) by mouth daily. 90 tablet 3  . oxyCODONE (OXY IR/ROXICODONE) 5 MG immediate release tablet Take 1-2 tablets (5-10 mg total) by mouth every 6 (six) hours as needed for moderate pain. 40 tablet 0   No current facility-administered medications for this visit.    Physical Exam BP 138/101 mmHg  Pulse 108  Resp 18  Ht 6\' 1"  (1.854 m)  Wt 243 lb (110.224 kg)  BMI 32.07 kg/m2  SpO56 63% 55 year old man in no acute distress Alert and oriented 3 with no focal deficits Cardiac regular rate and rhythm normal S1 and S2 Chest Incisions well healed Lungs slightly diminished at left base otherwise clear  Diagnostic Tests: CHEST 2 VIEW  COMPARISON: 06/01/2015  FINDINGS: Volume loss at the left base correlating with surgical history. There is no edema, consolidation, effusion, or pneumothorax. Normal heart size and aortic contours.  IMPRESSION: Stable postoperative chest.   Electronically Signed  By: Monte Fantasia M.D.  On: 07/06/2015 11:25 I personally reviewed the chest x-ray concur with findings noted above.  Impression: Fernando [name] is a 55 year old gentleman who had a left lower lobectomy for a suspicious lung mass. This turned out to be an inflammatory process not malignant. He is recovering well at this point in time. His pain has improved dramatically. He does have shortness of breath with  exertion, but that is improving also.  No further follow-up necessary pulmonary standpoint  Smoking cessation instruction/counseling given:  commended patient for quitting and reviewed strategies for preventing relapses  Plan: I will be happy to see Mr. Cover back any time the future if I can be of any further assistance with his care.  Melrose Nakayama, MD [facility] [phone]

## 2015-07-12 LAB — ACID FAST CULTURE WITH REFLEXED SENSITIVITIES (MYCOBACTERIA): Acid Fast Culture: NEGATIVE

## 2015-08-26 ENCOUNTER — Encounter (HOSPITAL_COMMUNITY): Payer: Self-pay | Admitting: Emergency Medicine

## 2015-08-26 ENCOUNTER — Emergency Department (HOSPITAL_COMMUNITY): Payer: Medicaid Other

## 2015-08-26 ENCOUNTER — Observation Stay (HOSPITAL_COMMUNITY)
Admission: EM | Admit: 2015-08-26 | Discharge: 2015-08-27 | Disposition: A | Discharge status: Home / Self Care | Payer: Medicaid Other | Attending: Internal Medicine | Admitting: Internal Medicine

## 2015-08-26 DIAGNOSIS — J9601 Acute respiratory failure with hypoxia: Secondary | ICD-10-CM | POA: Diagnosis not present

## 2015-08-26 DIAGNOSIS — G4733 Obstructive sleep apnea (adult) (pediatric): Secondary | ICD-10-CM | POA: Diagnosis present

## 2015-08-26 DIAGNOSIS — I10 Essential (primary) hypertension: Secondary | ICD-10-CM | POA: Diagnosis present

## 2015-08-26 DIAGNOSIS — R079 Chest pain, unspecified: Secondary | ICD-10-CM | POA: Diagnosis not present

## 2015-08-26 DIAGNOSIS — E669 Obesity, unspecified: Secondary | ICD-10-CM

## 2015-08-26 DIAGNOSIS — E876 Hypokalemia: Secondary | ICD-10-CM | POA: Diagnosis present

## 2015-08-26 DIAGNOSIS — Z87891 Personal history of nicotine dependence: Secondary | ICD-10-CM | POA: Insufficient documentation

## 2015-08-26 DIAGNOSIS — J449 Chronic obstructive pulmonary disease, unspecified: Secondary | ICD-10-CM | POA: Diagnosis present

## 2015-08-26 DIAGNOSIS — E039 Hypothyroidism, unspecified: Secondary | ICD-10-CM | POA: Diagnosis present

## 2015-08-26 DIAGNOSIS — R109 Unspecified abdominal pain: Secondary | ICD-10-CM | POA: Diagnosis not present

## 2015-08-26 DIAGNOSIS — K603 Anal fistula, unspecified: Secondary | ICD-10-CM | POA: Diagnosis present

## 2015-08-26 DIAGNOSIS — R0609 Other forms of dyspnea: Secondary | ICD-10-CM | POA: Diagnosis not present

## 2015-08-26 DIAGNOSIS — R0902 Hypoxemia: Principal | ICD-10-CM | POA: Insufficient documentation

## 2015-08-26 DIAGNOSIS — K529 Noninfective gastroenteritis and colitis, unspecified: Secondary | ICD-10-CM | POA: Diagnosis not present

## 2015-08-26 DIAGNOSIS — R0602 Shortness of breath: Secondary | ICD-10-CM | POA: Diagnosis present

## 2015-08-26 DIAGNOSIS — R600 Localized edema: Secondary | ICD-10-CM | POA: Diagnosis present

## 2015-08-26 HISTORY — DX: Acute respiratory failure with hypoxia: J96.01

## 2015-08-26 HISTORY — DX: Obstructive sleep apnea (adult) (pediatric): G47.33

## 2015-08-26 HISTORY — DX: Hypothyroidism, unspecified: E03.9

## 2015-08-26 HISTORY — DX: Acquired absence of lung (part of): Z90.2

## 2015-08-26 HISTORY — DX: Anal fistula: K60.3

## 2015-08-26 HISTORY — DX: Pneumonia, unspecified organism: J18.9

## 2015-08-26 LAB — URINALYSIS, ROUTINE W REFLEX MICROSCOPIC
BILIRUBIN URINE: NEGATIVE
Glucose, UA: NEGATIVE mg/dL
HGB URINE DIPSTICK: NEGATIVE
Ketones, ur: NEGATIVE mg/dL
Leukocytes, UA: NEGATIVE
Nitrite: NEGATIVE
PH: 6 (ref 5.0–8.0)
Protein, ur: NEGATIVE mg/dL
SPECIFIC GRAVITY, URINE: 1.02 (ref 1.005–1.030)

## 2015-08-26 LAB — BASIC METABOLIC PANEL
Anion gap: 12 (ref 5–15)
BUN: 11 mg/dL (ref 6–20)
CHLORIDE: 100 mmol/L — AB (ref 101–111)
CO2: 26 mmol/L (ref 22–32)
CREATININE: 1.08 mg/dL (ref 0.61–1.24)
Calcium: 8.3 mg/dL — ABNORMAL LOW (ref 8.9–10.3)
GFR calc Af Amer: >60 mL/min (ref >60)
GLUCOSE: 142 mg/dL — AB (ref 65–99)
POTASSIUM: 2.9 mmol/L — AB (ref 3.5–5.1)
SODIUM: 138 mmol/L (ref 135–145)

## 2015-08-26 LAB — HEPATIC FUNCTION PANEL
ALBUMIN: 3.5 g/dL (ref 3.5–5.0)
ALT: 54 U/L (ref 17–63)
AST: 55 U/L — ABNORMAL HIGH (ref 15–41)
Alkaline Phosphatase: 78 U/L (ref 38–126)
Bilirubin, Direct: <0.1 mg/dL — ABNORMAL LOW (ref 0.1–0.5)
TOTAL PROTEIN: 7.2 g/dL (ref 6.5–8.1)
Total Bilirubin: 0.6 mg/dL (ref 0.3–1.2)

## 2015-08-26 LAB — CBC
HEMATOCRIT: 40.9 % (ref 39.0–52.0)
Hemoglobin: 14 g/dL (ref 13.0–17.0)
MCH: 32.3 pg (ref 26.0–34.0)
MCHC: 34.2 g/dL (ref 30.0–36.0)
MCV: 94.5 fL (ref 78.0–100.0)
PLATELETS: 211 10*3/uL (ref 150–400)
RBC: 4.33 MIL/uL (ref 4.22–5.81)
RDW: 14 % (ref 11.5–15.5)
WBC: 7.5 10*3/uL (ref 4.0–10.5)

## 2015-08-26 LAB — DIFFERENTIAL
BASOS ABS: 0 10*3/uL (ref 0.0–0.1)
Basophils Relative: 0 %
EOS PCT: 9 %
Eosinophils Absolute: 0.7 10*3/uL (ref 0.0–0.7)
LYMPHS ABS: 1.7 10*3/uL (ref 0.7–4.0)
LYMPHS PCT: 23 %
Monocytes Absolute: 0.4 10*3/uL (ref 0.1–1.0)
Monocytes Relative: 5 %
NEUTROS ABS: 4.8 10*3/uL (ref 1.7–7.7)
NEUTROS PCT: 63 %

## 2015-08-26 LAB — MAGNESIUM: Magnesium: 1.8 mg/dL (ref 1.7–2.4)

## 2015-08-26 LAB — TSH: TSH: 10.217 u[IU]/mL — ABNORMAL HIGH (ref 0.350–4.500)

## 2015-08-26 LAB — TROPONIN I: Troponin I: <0.03 ng/mL (ref <0.03)

## 2015-08-26 LAB — BRAIN NATRIURETIC PEPTIDE: B Natriuretic Peptide: 28 pg/mL (ref 0.0–100.0)

## 2015-08-26 LAB — LIPASE, BLOOD: Lipase: 22 U/L (ref 11–51)

## 2015-08-26 LAB — D-DIMER, QUANTITATIVE: D-Dimer, Quant: 0.41 ug/mL-FEU (ref 0.00–0.50)

## 2015-08-26 MED ORDER — DILTIAZEM GEL 2 %: 1.0000 "application " | Freq: Every day | CUTANEOUS | Status: DC

## 2015-08-26 MED ORDER — ONDANSETRON HCL 4 MG/2ML IJ SOLN
4.0000 mg | Freq: Once | INTRAMUSCULAR | Status: AC
  Administered 2015-08-26: 4 mg via INTRAVENOUS
  Filled 2015-08-26: qty 2

## 2015-08-26 MED ORDER — ALPRAZOLAM 1 MG PO TABS
1.0000 mg | ORAL_TABLET | Freq: Three times a day (TID) | ORAL | Status: DC | PRN
  Administered 2015-08-26 – 2015-08-27 (×2): 1 mg via ORAL
  Filled 2015-08-26 (×2): qty 1

## 2015-08-26 MED ORDER — ACETAMINOPHEN 650 MG RE SUPP: 650.0000 mg | Freq: Four times a day (QID) | RECTAL | Status: DC | PRN

## 2015-08-26 MED ORDER — HYDROMORPHONE HCL 1 MG/ML IJ SOLN
1.0000 mg | Freq: Once | INTRAMUSCULAR | Status: AC
  Administered 2015-08-26: 1 mg via INTRAVENOUS
  Filled 2015-08-26: qty 1

## 2015-08-26 MED ORDER — DIATRIZOATE MEGLUMINE & SODIUM 66-10 % PO SOLN
ORAL | Status: AC
  Administered 2015-08-26: 30 mL
  Filled 2015-08-26: qty 30

## 2015-08-26 MED ORDER — POTASSIUM CHLORIDE CRYS ER 20 MEQ PO TBCR
40.0000 meq | EXTENDED_RELEASE_TABLET | Freq: Once | ORAL | Status: AC
  Administered 2015-08-26: 40 meq via ORAL
  Filled 2015-08-26: qty 2

## 2015-08-26 MED ORDER — POTASSIUM CHLORIDE IN NACL 20-0.9 MEQ/L-% IV SOLN
INTRAVENOUS | Status: DC
  Administered 2015-08-26: 18:00:00 via INTRAVENOUS

## 2015-08-26 MED ORDER — HYDROMORPHONE HCL 1 MG/ML IJ SOLN: 0.5000 mg | INTRAMUSCULAR | Status: DC | PRN

## 2015-08-26 MED ORDER — SODIUM CHLORIDE 0.9% FLUSH: 3.0000 mL | Freq: Two times a day (BID) | INTRAVENOUS | Status: DC

## 2015-08-26 MED ORDER — ACETAMINOPHEN 325 MG PO TABS: 650.0000 mg | ORAL_TABLET | Freq: Four times a day (QID) | ORAL | Status: DC | PRN

## 2015-08-26 MED ORDER — HYDROMORPHONE HCL 1 MG/ML IJ SOLN
0.5000 mg | Freq: Four times a day (QID) | INTRAMUSCULAR | Status: DC | PRN
  Administered 2015-08-26 – 2015-08-27 (×4): 0.5 mg via INTRAVENOUS
  Filled 2015-08-26 (×4): qty 1

## 2015-08-26 MED ORDER — IOPAMIDOL (ISOVUE-300) INJECTION 61%
100.0000 mL | Freq: Once | INTRAVENOUS | Status: AC | PRN
  Administered 2015-08-26: 100 mL via INTRAVENOUS

## 2015-08-26 MED ORDER — LISINOPRIL 10 MG PO TABS
20.0000 mg | ORAL_TABLET | Freq: Every day | ORAL | Status: DC
  Administered 2015-08-26 – 2015-08-27 (×2): 20 mg via ORAL
  Filled 2015-08-26 (×2): qty 2

## 2015-08-26 MED ORDER — PSYLLIUM 95 % PO PACK
PACK | Freq: Two times a day (BID) | ORAL | Status: DC
  Administered 2015-08-26: 1 via ORAL
  Filled 2015-08-26 (×8): qty 1

## 2015-08-26 MED ORDER — POTASSIUM CHLORIDE 10 MEQ/100ML IV SOLN
10.0000 meq | Freq: Once | INTRAVENOUS | Status: AC
  Administered 2015-08-26: 10 meq via INTRAVENOUS
  Filled 2015-08-26: qty 100

## 2015-08-26 MED ORDER — IPRATROPIUM-ALBUTEROL 0.5-2.5 (3) MG/3ML IN SOLN
3.0000 mL | Freq: Once | RESPIRATORY_TRACT | Status: AC
  Administered 2015-08-26: 3 mL via RESPIRATORY_TRACT
  Filled 2015-08-26: qty 3

## 2015-08-26 MED ORDER — OXYCODONE HCL 5 MG PO TABS
5.0000 mg | ORAL_TABLET | Freq: Four times a day (QID) | ORAL | Status: DC | PRN
  Administered 2015-08-26 – 2015-08-27 (×3): 10 mg via ORAL
  Filled 2015-08-26 (×3): qty 2

## 2015-08-26 MED ORDER — HYDROMORPHONE HCL 1 MG/ML IJ SOLN
0.5000 mg | Freq: Once | INTRAMUSCULAR | Status: AC
  Administered 2015-08-26: 0.5 mg via INTRAVENOUS
  Filled 2015-08-26: qty 1

## 2015-08-26 MED ORDER — HEPARIN SODIUM (PORCINE) 5000 UNIT/ML IJ SOLN
5000.0000 [IU] | Freq: Three times a day (TID) | INTRAMUSCULAR | Status: DC
  Administered 2015-08-26 – 2015-08-27 (×4): 5000 [IU] via SUBCUTANEOUS
  Filled 2015-08-26 (×4): qty 1

## 2015-08-26 MED ORDER — FLUTICASONE FUROATE-VILANTEROL 100-25 MCG/INH IN AEPB
1.0000 | INHALATION_SPRAY | Freq: Every day | RESPIRATORY_TRACT | Status: DC
  Administered 2015-08-27: 1 via RESPIRATORY_TRACT
  Filled 2015-08-26: qty 28

## 2015-08-26 MED ORDER — ONDANSETRON HCL 4 MG/2ML IJ SOLN
4.0000 mg | Freq: Three times a day (TID) | INTRAMUSCULAR | Status: AC | PRN
Start: 2015-08-26 — End: 2015-08-27
  Administered 2015-08-26: 4 mg via INTRAVENOUS
  Filled 2015-08-26: qty 2

## 2015-08-26 MED ORDER — ALBUTEROL SULFATE (2.5 MG/3ML) 0.083% IN NEBU: 2.5000 mg | INHALATION_SOLUTION | RESPIRATORY_TRACT | Status: DC | PRN

## 2015-08-26 MED ORDER — ALBUTEROL SULFATE (2.5 MG/3ML) 0.083% IN NEBU
2.5000 mg | INHALATION_SOLUTION | Freq: Once | RESPIRATORY_TRACT | Status: AC
  Administered 2015-08-26: 2.5 mg via RESPIRATORY_TRACT
  Filled 2015-08-26: qty 3

## 2015-08-26 NOTE — H&P (Addendum)
History and Physical    Fernando [name] [id number] DOB: 06/03/1960 DOA: 08/26/2015  PCP: Collene Mares, PA-C  Patient coming from: Home  Chief Complaint: Shortness of breath and chest pain; abdominal bloating.  HPI: Fernando [name] is a 55 y.o. male with medical history significant for left lower lobe lobectomy of lung 04/2015 for workup of a lung mass which turned out to be infection/abscess, chronic left-sided chest pain status post the lobectomy, anal fissure-status post perirectal abscess-being treated at [facility] by general surgery, and hypertension, who presents to the ED with a chief complaint of shortness of breath at rest and with exertion and left-sided chest pain. He is also had some abdominal bloating. Patient reports worsening dyspnea on exertion over the past several days. He admits to having intermittent shortness of breath and left-sided chest pain following the left lower lobe lung lobectomy in April 2017. However, over the past several days, he has had shortness of breath when laying flat, shortness of breath at rest, shortness of breath with exertion. His left-sided chest pain is not the same as the pain he has been having. He describes the pain as sharp, but not associated with diaphoresis, nausea, or radiation. His mother noticed that he stops breathing when he is asleep at night. He has never been diagnosed with OSA. He denies COPD or asthma, but he is prescribed inhalers which he uses periodically. He has had some swelling in his ankles.  ED Course: In the ED, he was afebrile and hemodynamically stable. His EKG revealed no suspicious ST or T-wave abnormalities. His chest x-ray revealed postoperative changes on the left with some volume loss, but no edema or consolidation. CT scan of his abdomen and pelvis with contrast revealed no acute findings; no bowel obstruction or bowel wall inflammation. His lab data are significant for a normal troponin I, normal BNP, normal d-dimer,  and potassium of 2.9. He is being admitted for further evaluation and management.  Review of Systems: As per HPI; in addition, he has chronic left-sided chest pain, chronic shortness of breath, occasional swelling in his ankles, occasional dry cough, and chronic diarrhea and losing of blood and fecal material from his anal fissure; otherwise 10 point review of systems negative.    Past Medical History:  Diagnosis Date  . Allergy   . Anal fissure   . 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 Complication of anesthesia    woke up during colonoscopy  . HCAP (healthcare-associated pneumonia) 05/12/2015  . Hypertension 12/30/2011  . Loose stools   . Lung mass    hx of left lower benign mass  . Peri-rectal abscess    multiple.   . Pneumonia   . Shortness of breath dyspnea    with exertion  . 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 Tobacco abuse   . Umbilical hernia    has been repaired    Past Surgical History:  Procedure Laterality Date  . APPENDECTOMY    . CERVICAL FUSION    . COLONOSCOPY    . CYSTOSCOPY N/A 05/01/2013   Procedure: CYSTOSCOPY;  Surgeon: Marissa Nestle, MD;  Location: [facility];  Service: Urology;  Laterality: N/A;  . EVALUATION UNDER ANESTHESIA WITH FISTULECTOMY N/A 06/23/2015   Procedure: ANAL EXAM UNDER ANESTHESIA  FISTULOTOMY;  Surgeon: Leighton Ruff, MD;  Location: [facility];  Service: General;  Laterality: N/A;  . FACIAL COSMETIC SURGERY    . HERNIA REPAIR    . INCISION AND  DRAINAGE ABSCESS Right 05/01/2013   Procedure: INCISION AND DRAINAGE RIGHT SCROTAL ABSCESS;  Surgeon: Marissa Nestle, MD;  Location: [facility];  Service: Urology;  Laterality: Right;  . INCISION AND DRAINAGE PERIRECTAL ABSCESS    . KNEE ARTHROSCOPY    . SPINE SURGERY     cervical  . VIDEO ASSISTED THORACOSCOPY (VATS)/ LOBECTOMY Left 05/03/2015   Procedure: LEFT VIDEO ASSISTED THORACOSCOPY (VATS)/ LEFT LOWER LOBECTOMY, ON-Q CATHETER PLACEMENT;  Surgeon: Melrose Nakayama, MD;   Location: [facility];  Service: Thoracic;  Laterality: Left;  Marland Kitchen VIDEO BRONCHOSCOPY WITH ENDOBRONCHIAL ULTRASOUND N/A 04/22/2015   Procedure: VIDEO BRONCHOSCOPY WITH ENDOBRONCHIAL ULTRASOUND;  Surgeon: Melrose Nakayama, MD;  Location: [facility];  Service: Thoracic;  Laterality: N/A;    Social history: Patient is divorced. He moved back in with his parents. He is a former Administrator. Disability is pending. He reports that he quit smoking about 5 months ago. His smoking use included Cigarettes. He smoked 0.00 packs per day for 15.00 years. He has never used smokeless tobacco. He reports that he does not drink alcohol or use drugs.  Allergies  Allergen Reactions  . Morphine And Related Other (See Comments)    hallucinations    Family History  Problem Relation Age of Onset  . Breast cancer Mother      Prior to Admission medications   Medication Sig Start Date End Date Taking? Authorizing Provider  albuterol (PROVENTIL HFA;VENTOLIN HFA) 108 (90 Base) MCG/ACT inhaler Inhale 2 puffs into the lungs every 6 (six) hours as needed for wheezing or shortness of breath. 03/12/15  Yes Ripudeep Krystal Eaton, MD  ALPRAZolam Duanne Moron) 1 MG tablet Take 1 tablet (1 mg total) by mouth 3 (three) times daily as needed for anxiety. 03/12/15  Yes Ripudeep K Rai, MD  diltiazem 2 % GEL Apply 1 application topically daily.   Yes Historical Provider, MD  lisinopril (PRINIVIL,ZESTRIL) 20 MG tablet Take 1 tablet (20 mg total) by mouth daily. 05/17/15  Yes Maren Reamer, MD  oxyCODONE-acetaminophen (PERCOCET/ROXICET) 5-325 MG tablet Take 1 tablet by mouth every 6 (six) hours as needed for moderate pain or severe pain.   Yes Historical Provider, MD  promethazine (PHENERGAN) 25 MG tablet Take 25 mg by mouth every 6 (six) hours as needed for nausea or vomiting.   Yes Historical Provider, MD  Psyllium (METAMUCIL FIBER PO) Take 1-2 capsules by mouth 2 (two) times daily. 2 capsules in the morning and 1 capsule in the evening.   Yes Historical  Provider, MD  budesonide-formoterol (SYMBICORT) 80-4.5 MCG/ACT inhaler Inhale 2 puffs into the lungs 2 (two) times daily. 05/14/15   Shanker Kristeen Mans, MD  oxyCODONE (OXY IR/ROXICODONE) 5 MG immediate release tablet Take 1-2 tablets (5-10 mg total) by mouth every 6 (six) hours as needed for moderate pain. Patient not taking: Reported on [phone]   Leighton Ruff, MD    Physical Exam: Vitals:   08/26/15 1430 08/26/15 1500 08/26/15 1530 08/26/15 1536  BP: 121/85 130/88 116/87 116/87  Pulse: 81 81 77 78  Resp: 17 17 24 21   Temp:      TempSrc:      SpO2: 98% 93% 90% 91%  Weight:      Height:          Constitutional: NAD, calm, comfortable Vitals:   08/26/15 1430 08/26/15 1500 08/26/15 1530 08/26/15 1536  BP: 121/85 130/88 116/87 116/87  Pulse: 81 81 77 78  Resp: 17 17 24 21   Temp:  TempSrc:      SpO2: 98% 93% 90% 91%  Weight:      Height:       Eyes: PERRL, lids and conjunctivae normal ENMT: Mucous membranes are moist. Posterior pharynx clear of any exudate or lesions.Normal dentition.  Neck: normal, supple, no masses, no thyromegaly Respiratory: clear to auscultation bilaterally, no wheezing, no crackles. Normal respiratory effort. No accessory muscle use.  Cardiovascular: Regular rate and rhythm, no murmurs / rubs / gallops. Trace bilateral extremity edema. 2+ pedal pulses. No carotid bruits. Mild to moderate tenderness noted on the left lateral chest wall. Abdomen:Very obese; protuberant; minimal tenderness in the hypogastrium/epigastrium no tenderness; no masses palpated. No hepatosplenomegaly. Bowel sounds positive.  Musculoskeletal: no clubbing / cyanosis. No joint deformity upper and lower extremities. Good ROM, no contractures. Normal muscle tone. Mild to moderate tenderness noted on the left lateral chest wall without rash or erythema. Skin: no rashes, lesions, ulcers. No induration Neurologic: CN 2-12 grossly intact. Sensation intact, DTR normal. Strength 5/5  in all 4.  Psychiatric: Normal judgment and insight. Alert and oriented x 3. Slightly anxious.     Labs on Admission: I have personally reviewed following labs and imaging studies  CBC:  Recent Labs Lab 08/26/15 0918  WBC 7.5  NEUTROABS 4.8  HGB 14.0  HCT 40.9  MCV 94.5  PLT 123456   Basic Metabolic Panel:  Recent Labs Lab 08/26/15 0918  NA 138  K 2.9*  CL 100*  CO2 26  GLUCOSE 142*  BUN 11  CREATININE 1.08  CALCIUM 8.3*   GFR: Estimated Creatinine Clearance: 107 mL/min (by C-G formula based on SCr of 1.08 mg/dL). Liver Function Tests:  Recent Labs Lab 08/26/15 0918  AST 55*  ALT 54  ALKPHOS 78  BILITOT 0.6  PROT 7.2  ALBUMIN 3.5    Recent Labs Lab 08/26/15 0918  LIPASE 22   No results for input(s): AMMONIA in the last 168 hours. Coagulation Profile: No results for input(s): INR, PROTIME in the last 168 hours. Cardiac Enzymes:  Recent Labs Lab 08/26/15 0918  TROPONINI <0.03   BNP (last 3 results) No results for input(s): PROBNP in the last 8760 hours. HbA1C: No results for input(s): HGBA1C in the last 72 hours. CBG: No results for input(s): GLUCAP in the last 168 hours. Lipid Profile: No results for input(s): CHOL, HDL, LDLCALC, TRIG, CHOLHDL, LDLDIRECT in the last 72 hours. Thyroid Function Tests: No results for input(s): TSH, T4TOTAL, FREET4, T3FREE, THYROIDAB in the last 72 hours. Anemia Panel: No results for input(s): VITAMINB12, FOLATE, FERRITIN, TIBC, IRON, RETICCTPCT in the last 72 hours. Urine analysis:    Component Value Date/Time   COLORURINE YELLOW 08/26/2015 [place] 08/26/2015 1243   LABSPEC 1.020 08/26/2015 1243   PHURINE 6.0 08/26/2015 1243   GLUCOSEU NEGATIVE 08/26/2015 1243   HGBUR NEGATIVE 08/26/2015 1243   BILIRUBINUR NEGATIVE 08/26/2015 1243   KETONESUR NEGATIVE 08/26/2015 1243   PROTEINUR NEGATIVE 08/26/2015 1243   UROBILINOGEN 0.2 08/15/2009 0510   NITRITE NEGATIVE 08/26/2015 1243    LEUKOCYTESUR NEGATIVE 08/26/2015 1243   Sepsis Labs: !!!!!!!!!!!!!!!!!!!!!!!!!!!!!!!!!!!!!!!!!!!! @LABRCNTIP (procalcitonin:4,lacticidven:4) )No results found for this or any previous visit (from the past 240 hour(s)).   Radiological Exams on Admission: Dg Chest 2 View  Result Date: 08/26/2015 CLINICAL DATA:  Shortness of breath and cough EXAM: CHEST  2 VIEW COMPARISON:  July 06, 2015 FINDINGS: Mild elevation of the left hemidiaphragm is stable. There is no edema or consolidation. There is mild scarring in  the left base. There is a clip in the left perihilar region. The heart size and pulmonary vascularity are normal. No adenopathy. There is mild degenerative change in the thoracic spine. There is postoperative change in the lower cervical region. IMPRESSION: Postoperative change on the left with volume loss. No edema or consolidation. Stable cardiac silhouette. Electronically Signed   By: Lowella Grip III M.D.   On: 08/26/2015 09:57   Ct Abdomen Pelvis W Contrast  Result Date: 08/26/2015 CLINICAL DATA:  Shortness of breath with abdominal pain and distention for 4 days. History of umbilical hernia repair, anal fissure repair June 17th, appendectomy. EXAM: CT ABDOMEN AND PELVIS WITH CONTRAST TECHNIQUE: Multidetector CT imaging of the abdomen and pelvis was performed using the standard protocol following bolus administration of intravenous contrast. CONTRAST:  190mL ISOVUE-300 IOPAMIDOL (ISOVUE-300) INJECTION 61% COMPARISON:  CT abdomen dated 03/10/2015. FINDINGS: Lower chest:  No acute findings. Hepatobiliary: Liver and gallbladder appear normal. Pancreas: No mass, inflammatory changes, or other significant abnormality. Spleen: Within normal limits in size and appearance. Adrenals/Urinary Tract: Adrenal glands appear normal. Kidneys appear normal without mass, stone or hydronephrosis. No ureteral or bladder calculi identified. Bladder appears normal. Stomach/Bowel: Bowel is normal in caliber. No  bowel wall thickening or evidence of bowel wall inflammation. Status post appendectomy. Stomach appears normal, mildly distended with air and recently ingested material. Vascular/Lymphatic: No pathologically enlarged lymph nodes. No evidence of abdominal aortic aneurysm. Reproductive: Prostate gland mildly prominent in size causing slight mass effect on the bladder base. Otherwise unremarkable. Other: No free fluid or abscess collection seen. No free intraperitoneal air. Musculoskeletal: Mild degenerative change within the lumbar spine. No acute or suspicious osseous finding. 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 IMPRESSION: 1. No acute findings in the abdomen or pelvis. No bowel obstruction or evidence of bowel wall inflammation. No soft tissue mass or free fluid. No evidence of acute solid organ abnormality. No free intraperitoneal air. 2. Additional chronic/incidental findings detailed above. Electronically Signed   By: Franki Cabot M.D.   On: 08/26/2015 11:54   Dg Abd 2 Views  Result Date: 08/26/2015 CLINICAL DATA:  Abdominal pain EXAM: ABDOMEN - 2 VIEW COMPARISON:  April 24, 2015 FINDINGS: Supine and upright images were obtained. There is moderate stool throughout the colon. There is no appreciable bowel dilatation. However, there are multiple air-fluid levels scattered throughout the abdomen. No free air evident. Visualized lung bases are clear. There are old healed fractures of the posterior right tenth and eleventh ribs. IMPRESSION: Scattered air-fluid levels without bowel dilatation. Suspect early ileus or enteritis. Obstruction is felt to be less likely. No free air. Electronically Signed   By: Lowella Grip III M.D.   On: 08/26/2015 09:58    EKG: Independently reviewed. Normal-size rhythm with a heart rate of 78 bpm and no suspicious ST or T-wave abnormalities.  Assessment/Plan Principal Problem:   Dyspnea on  exertion Active Problems:   Left sided chest pain   Obesity   Anal fistula   Pedal edema   Hypokalemia   Chronic diarrhea   Essential hypertension    1. Dyspnea on exertion. The patient had a left lower lobe lobectomy in April 2017. Per chart review, he was informed by his cardiothoracic surgeon, Dr. Koleen Nimrod that it will take some time for his exercise tolerance to be regained. His chest x-ray reveals some volume loss on the left, but no edema or infiltrates. His BNP and d-dimer are within normal limits.  Although he denies COPD or asthma, he is on scheduled Symbicort and when necessary albuterol inhaler. He stopped smoking in February 2017. He reports that his mother says that he stops breathing when he is asleep. He has had some mild ankle edema intermittently. He does have a trace of pedal edema on exam. -The exact etiology of his presentation is not clear, but the differential diagnoses include shortness of breath due to obesity and deconditioning, left lower lobe lung loss, obstructive sleep apnea not clinically diagnosed, or right heart dysfunction. -We will order a 2-D echocardiogram to assess his LV and RV function. -We'll ask the nursing staff and respiratory therapist to monitor the patient for OSA. -We will order bilateral lower extremity venous ultrasound to rule out DVT. -We'll check a TSH.  Left-sided chest pain. Per review of the patient's chart, he has a history of chronic left-sided chest pain and has been admitted on a number of occasions because of it. His EKG and troponin I are reassuring. On exam, there was some reproducible chest pain, likely from chest wall pain. -We'll cycle his cardiac enzymes. We'll treat his pain with as needed analgesics. Will order 2-D echocardiogram to evaluate for left ventricular wall motion abnormalities.  Essential hypertension. He is treated chronically with lisinopril. It will be continued.  Hypokalemia. Patient's serum potassium is  2.9 on admission. The likely etiology is from chronic diarrhea as a consequence of his chronic anal fissure (not examined). -We'll start potassium chloride supplementation. Will order a magnesium level to rule out deficiency.  Chronic anal fistula/fissure. Status post perirectal abscess several months ago. -Patient is followed by general surgery at [facility]. [place] continue to monitor and treat supportively.    DVT prophylaxis: Subcutaneous heparin Code Status: Full code Family Communication: Family not available Disposition Plan: Discharged home in the next 24-48 hours Consults called: 9 Admission status: Observation, telemetry   Watkins MD [facility] Pager [phone]  If 7PM-7AM, please contact night-coverage [email or web address] Password [facility]  08/26/2015, 4:22 PM

## 2015-08-26 NOTE — ED Notes (Signed)
EDP at bedside  

## 2015-08-26 NOTE — ED Triage Notes (Addendum)
Pt reports sob x3-4 days, went to see Dr. Gerarda Fraction this morning and was sent here.  Pt had part of left lung removed in April due to cancer.  Pt has one area of cp in left side of chest pain with no radiation.  Pt also has new swelling in bilateral feet.

## 2015-08-26 NOTE — ED Notes (Signed)
Attempted report. Floor states they will call back shortly.

## 2015-08-26 NOTE — ED Notes (Signed)
Pt c/o increased pain in rectum. EDP notified.

## 2015-08-26 NOTE — ED Notes (Signed)
EDP at bedside updating patient and family. 

## 2015-08-26 NOTE — ED Provider Notes (Signed)
[facility] DEPT Provider Note   CSN: [id number] Arrival date & time: 08/26/15  0859     History   Chief Complaint Chief Complaint  Patient presents with  . Shortness of Breath    HPI Fernando [name] is a 55 y.o. male presenting with increased shortness of breath which started this weekend.  He denies air hunger, but states can't take a deep breath secondary to severe lower chest and upper abdominal pain with deep inspiration.  He does endorse slowly increasing abdominal distention since his VATs  Procedure in April which was diagnostic of a pulmonary abscess, not CA. He endorses pain in his left chest since the surgery but it was improving until this weekend.  He denies palpitations, dizziness, fevers, chills.  He does endorse developing a cough this am with a few episodes of clear sputum production "peppered with a black substance".  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  HPI  Past Medical History:  Diagnosis Date  . Allergy   . Anal fissure   . Complication of anesthesia    woke up during colonoscopy  . Hypertension 12/30/2011  . Loose stools   . Lung mass    hx of left lower benign mass  . Peri-rectal abscess    multiple.   . Pneumonia   . Shortness of breath dyspnea    with exertion  . Tobacco abuse   . Umbilical hernia    has been repaired    Patient Active Problem List   Diagnosis Date Noted  . Dyspnea on exertion 08/26/2015  . Pain in the chest   . Chest pain 05/12/2015  . HCAP (healthcare-associated pneumonia) 05/12/2015  . Nodule of left lung 05/03/2015  . Lung mass 03/30/2015  . Peri-rectal abscess 03/07/2015  . Abnormal CXR 03/07/2015  . Scrotal wall abscess 05/01/2013  . Cellulitis 05/01/2013  . Hematuria 04/30/2013  . Cellulitis of scrotum 04/29/2013  . Tick bite 04/29/2013  . Hypertension 12/30/2011    Past Surgical History:  Procedure Laterality Date  .  APPENDECTOMY    . CERVICAL FUSION    . COLONOSCOPY    . CYSTOSCOPY N/A 05/01/2013   Procedure: CYSTOSCOPY;  Surgeon: Marissa Nestle, MD;  Location: [facility];  Service: Urology;  Laterality: N/A;  . EVALUATION UNDER ANESTHESIA WITH FISTULECTOMY N/A 06/23/2015   Procedure: ANAL EXAM UNDER ANESTHESIA  FISTULOTOMY;  Surgeon: Leighton Ruff, MD;  Location: [place];  Service: General;  Laterality: N/A;  . FACIAL COSMETIC SURGERY    . HERNIA REPAIR    . INCISION AND DRAINAGE ABSCESS Right 05/01/2013   Procedure: INCISION AND DRAINAGE RIGHT SCROTAL ABSCESS;  Surgeon: Marissa Nestle, MD;  Location: [facility];  Service: Urology;  Laterality: Right;  . INCISION AND DRAINAGE PERIRECTAL ABSCESS    . KNEE ARTHROSCOPY    . SPINE SURGERY     cervical  . VIDEO ASSISTED THORACOSCOPY (VATS)/ LOBECTOMY Left 05/03/2015   Procedure: LEFT VIDEO ASSISTED THORACOSCOPY (VATS)/ LEFT LOWER LOBECTOMY, ON-Q CATHETER PLACEMENT;  Surgeon: Melrose Nakayama, MD;  Location: [place];  Service: Thoracic;  Laterality: Left;  Marland Kitchen VIDEO BRONCHOSCOPY WITH ENDOBRONCHIAL ULTRASOUND N/A 04/22/2015   Procedure: VIDEO BRONCHOSCOPY WITH ENDOBRONCHIAL ULTRASOUND;  Surgeon: Melrose Nakayama, MD;  Location: [place];  Service: Thoracic;  Laterality: N/A;       Home Medications    Prior to Admission medications   Medication Sig Start Date End  Date Taking? Authorizing Provider  albuterol (PROVENTIL HFA;VENTOLIN HFA) 108 (90 Base) MCG/ACT inhaler Inhale 2 puffs into the lungs every 6 (six) hours as needed for wheezing or shortness of breath. 03/12/15  Yes Ripudeep Krystal Eaton, MD  ALPRAZolam Duanne Moron) 1 MG tablet Take 1 tablet (1 mg total) by mouth 3 (three) times daily as needed for anxiety. 03/12/15  Yes Ripudeep K Rai, MD  diltiazem 2 % GEL Apply 1 application topically daily.   Yes Historical Provider, MD  lisinopril (PRINIVIL,ZESTRIL) 20 MG tablet Take 1 tablet (20 mg total) by mouth daily. 05/17/15  Yes Maren Reamer, MD    oxyCODONE-acetaminophen (PERCOCET/ROXICET) 5-325 MG tablet Take 1 tablet by mouth every 6 (six) hours as needed for moderate pain or severe pain.   Yes Historical Provider, MD  promethazine (PHENERGAN) 25 MG tablet Take 25 mg by mouth every 6 (six) hours as needed for nausea or vomiting.   Yes Historical Provider, MD  Psyllium (METAMUCIL FIBER PO) Take 1-2 capsules by mouth 2 (two) times daily. 2 capsules in the morning and 1 capsule in the evening.   Yes Historical Provider, MD  budesonide-formoterol (SYMBICORT) 80-4.5 MCG/ACT inhaler Inhale 2 puffs into the lungs 2 (two) times daily. 05/14/15   Shanker Kristeen Mans, MD  oxyCODONE (OXY IR/ROXICODONE) 5 MG immediate release tablet Take 1-2 tablets (5-10 mg total) by mouth every 6 (six) hours as needed for moderate pain. Patient not taking: Reported on [phone]   Leighton Ruff, MD    Family History Family History  Problem Relation Age of Onset  . Breast cancer Mother     Social History Social History  Substance Use Topics  . Smoking status: Former Smoker    Packs/day: 0.00    Years: 15.00    Types: Cigarettes    Quit date: 03/05/2015  . Smokeless tobacco: Never Used  . Alcohol use No     Comment: occ     Allergies   Morphine and related   Review of Systems Review of Systems  Constitutional: Negative for chills and fever.  HENT: Negative.  Negative for congestion, nosebleeds and sore throat.   Eyes: Negative.   Respiratory: Positive for cough and shortness of breath. Negative for chest tightness.   Cardiovascular: Positive for chest pain.  Gastrointestinal: Positive for abdominal distention, abdominal pain, nausea and rectal pain.  Genitourinary: Negative.  Negative for dysuria and hematuria.  Musculoskeletal: Negative for arthralgias, joint swelling and neck pain.  Skin: Negative.  Negative for rash and wound.  Neurological: Negative for dizziness, weakness, light-headedness, numbness and headaches.   Psychiatric/Behavioral: Negative.      Physical Exam Updated Vital Signs BP 119/85   Pulse 84   Temp 98 F (36.7 C) (Oral)   Resp 19   Ht 6' (1.829 m)   Wt 122.5 kg   SpO2 91%   BMI 36.62 kg/m   Physical Exam  Constitutional: He appears well-developed and well-nourished.  Appears uncomfortable.  HENT:  Head: Normocephalic and atraumatic.  Eyes: Conjunctivae are normal.  Neck: Normal range of motion.  Cardiovascular: Normal rate, regular rhythm, normal heart sounds and intact distal pulses.   Trace ankle edema.   Pulmonary/Chest: Effort normal and breath sounds normal. He has no wheezes.  Pt winces with pain with deep inspiration. No wheezing, rales, rhonchi appreciated.  Reproducible pain on palpation of lower anterior chest wall but also of upper left and right abdomen.  Abdominal: Soft. He exhibits distension. He exhibits no mass. Bowel sounds are  increased. There is tenderness. There is no guarding.  Abdominal distention with increased tympany.  Bowel sounds increased, higher pitched in mid upper abdomen.  TTP bilateral upper abd without guarding.    Musculoskeletal: Normal range of motion.  Neurological: He is alert.  Skin: Skin is warm and dry.  Psychiatric: He has a normal mood and affect.  Nursing note and vitals reviewed.    ED Treatments / Results  Labs  Results for orders placed or performed during the hospital encounter of [id number]  Basic metabolic panel  Result Value Ref Range   Sodium 138 135 - 145 mmol/L   Potassium 2.9 (L) 3.5 - 5.1 mmol/L   Chloride 100 (L) 101 - 111 mmol/L   CO2 26 22 - 32 mmol/L   Glucose, Bld 142 (H) 65 - 99 mg/dL   BUN 11 6 - 20 mg/dL   Creatinine, Ser 1.08 0.61 - 1.24 mg/dL   Calcium 8.3 (L) 8.9 - 10.3 mg/dL   GFR calc non Af Amer >60 >60 mL/min   GFR calc Af Amer >60 >60 mL/min   Anion gap 12 5 - 15  CBC  Result Value Ref Range   WBC 7.5 4.0 - 10.5 K/uL   RBC 4.33 4.22 - 5.81 MIL/uL   Hemoglobin 14.0 13.0 - 17.0 g/dL    HCT 40.9 39.0 - 52.0 %   MCV 94.5 78.0 - 100.0 fL   MCH 32.3 26.0 - 34.0 pg   MCHC 34.2 30.0 - 36.0 g/dL   RDW 14.0 11.5 - 15.5 %   Platelets 211 150 - 400 K/uL  Troponin I  Result Value Ref Range   Troponin I <0.03 <0.03 ng/mL  Brain natriuretic peptide  Result Value Ref Range   B Natriuretic Peptide 28.0 0.0 - 100.0 pg/mL  Lipase, blood  Result Value Ref Range   Lipase 22 11 - 51 U/L  D-dimer, quantitative (not at [facility])  Result Value Ref Range   D-Dimer, Quant 0.41 0.00 - 0.50 ug/mL-FEU  Urinalysis, Routine w reflex microscopic (not at [facility])  Result Value Ref Range   Color, Urine YELLOW YELLOW   APPearance CLEAR CLEAR   Specific Gravity, Urine 1.020 1.005 - 1.030   pH 6.0 5.0 - 8.0   Glucose, UA NEGATIVE NEGATIVE mg/dL   Hgb urine dipstick NEGATIVE NEGATIVE   Bilirubin Urine NEGATIVE NEGATIVE   Ketones, ur NEGATIVE NEGATIVE mg/dL   Protein, ur NEGATIVE NEGATIVE mg/dL   Nitrite NEGATIVE NEGATIVE   Leukocytes, UA NEGATIVE NEGATIVE  Hepatic function panel  Result Value Ref Range   Total Protein 7.2 6.5 - 8.1 g/dL   Albumin 3.5 3.5 - 5.0 g/dL   AST 55 (H) 15 - 41 U/L   ALT 54 17 - 63 U/L   Alkaline Phosphatase 78 38 - 126 U/L   Total Bilirubin 0.6 0.3 - 1.2 mg/dL   Bilirubin, Direct <0.1 (L) 0.1 - 0.5 mg/dL   Indirect Bilirubin NOT CALCULATED 0.3 - 0.9 mg/dL  Differential  Result Value Ref Range   Neutrophils Relative % 63 %   Neutro Abs 4.8 1.7 - 7.7 K/uL   Lymphocytes Relative 23 %   Lymphs Abs 1.7 0.7 - 4.0 K/uL   Monocytes Relative 5 %   Monocytes Absolute 0.4 0.1 - 1.0 K/uL   Eosinophils Relative 9 %   Eosinophils Absolute 0.7 0.0 - 0.7 K/uL   Basophils Relative 0 %   Basophils Absolute 0.0 0.0 - 0.1 K/uL   Dg Chest 2 View  Result Date: 08/26/2015 CLINICAL DATA:  Shortness of breath and cough EXAM: CHEST  2 VIEW COMPARISON:  July 06, 2015 FINDINGS: Mild elevation of the left hemidiaphragm is stable. There is no edema or consolidation. There is mild  scarring in the left base. There is a clip in the left perihilar region. The heart size and pulmonary vascularity are normal. No adenopathy. There is mild degenerative change in the thoracic spine. There is postoperative change in the lower cervical region. IMPRESSION: Postoperative change on the left with volume loss. No edema or consolidation. Stable cardiac silhouette. Electronically Signed   By: Lowella Grip III M.D.   On: 08/26/2015 09:57   Ct Abdomen Pelvis W Contrast  Result Date: 08/26/2015 CLINICAL DATA:  Shortness of breath with abdominal pain and distention for 4 days. History of umbilical hernia repair, anal fissure repair June 17th, appendectomy. EXAM: CT ABDOMEN AND PELVIS WITH CONTRAST TECHNIQUE: Multidetector CT imaging of the abdomen and pelvis was performed using the standard protocol following bolus administration of intravenous contrast. CONTRAST:  152mL ISOVUE-300 IOPAMIDOL (ISOVUE-300) INJECTION 61% COMPARISON:  CT abdomen dated 03/10/2015. FINDINGS: Lower chest:  No acute findings. Hepatobiliary: Liver and gallbladder appear normal. Pancreas: No mass, inflammatory changes, or other significant abnormality. Spleen: Within normal limits in size and appearance. Adrenals/Urinary Tract: Adrenal glands appear normal. Kidneys appear normal without mass, stone or hydronephrosis. No ureteral or bladder calculi identified. Bladder appears normal. Stomach/Bowel: Bowel is normal in caliber. No bowel wall thickening or evidence of bowel wall inflammation. Status post appendectomy. Stomach appears normal, mildly distended with air and recently ingested material. Vascular/Lymphatic: No pathologically enlarged lymph nodes. No evidence of abdominal aortic aneurysm. Reproductive: Prostate gland mildly prominent in size causing slight mass effect on the bladder base. Otherwise unremarkable. Other: No free fluid or abscess collection seen. No free intraperitoneal air. Musculoskeletal: Mild degenerative  change within the lumbar spine. No acute or suspicious osseous finding. 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 IMPRESSION: 1. No acute findings in the abdomen or pelvis. No bowel obstruction or evidence of bowel wall inflammation. No soft tissue mass or free fluid. No evidence of acute solid organ abnormality. No free intraperitoneal air. 2. Additional chronic/incidental findings detailed above. Electronically Signed   By: Franki Cabot M.D.   On: 08/26/2015 11:54   Dg Abd 2 Views  Result Date: 08/26/2015 CLINICAL DATA:  Abdominal pain EXAM: ABDOMEN - 2 VIEW COMPARISON:  April 24, 2015 FINDINGS: Supine and upright images were obtained. There is moderate stool throughout the colon. There is no appreciable bowel dilatation. However, there are multiple air-fluid levels scattered throughout the abdomen. No free air evident. Visualized lung bases are clear. There are old healed fractures of the posterior right tenth and eleventh ribs. IMPRESSION: Scattered air-fluid levels without bowel dilatation. Suspect early ileus or enteritis. Obstruction is felt to be less likely. No free air. Electronically Signed   By: Lowella Grip III M.D.   On: 08/26/2015 09:58     EKG  EKG Interpretation  Date/Time:  Thursday August 26 2015 09:08:06 EDT Ventricular Rate:  78 PR Interval:    QRS Duration: 103 QT Interval:  387 QTC Calculation: 441 R Axis:   -15 Text Interpretation:  Sinus rhythm Inferior infarct, old Baseline wander When compared with ECG of 04/23/2015 No significant change was found Confirmed by [facility]  MD, Nunzio Cory [phone]) on 08/26/2015 10:27:25 AM          Procedures Procedures (including critical  care time)  Medications Ordered in ED Medications  ipratropium-albuterol (DUONEB) 0.5-2.5 (3) MG/3ML nebulizer solution 3 mL (not administered)  albuterol (PROVENTIL) (2.5 MG/3ML) 0.083% nebulizer solution 2.5 mg (not  administered)  HYDROmorphone (DILAUDID) injection 1 mg (1 mg Intravenous Given 08/26/15 1005)  ondansetron (ZOFRAN) injection 4 mg (4 mg Intravenous Given 08/26/15 1005)  potassium chloride 10 mEq in 100 mL IVPB (0 mEq Intravenous Stopped 08/26/15 1206)  diatrizoate meglumine-sodium (GASTROGRAFIN) 66-10 % solution (30 mLs  Given 08/26/15 1035)  iopamidol (ISOVUE-300) 61 % injection 100 mL (100 mLs Intravenous Contrast Given 08/26/15 1111)  potassium chloride SA (K-DUR,KLOR-CON) CR tablet 40 mEq (40 mEq Oral Given 08/26/15 1243)  HYDROmorphone (DILAUDID) injection 0.5 mg (0.5 mg Intravenous Given 08/26/15 1345)     Initial Impression / Assessment and Plan / ED Course  I have reviewed the triage vital signs and the nursing notes.  Pertinent labs & imaging results that were available during my care of the patient were reviewed by me and considered in my medical decision making (see chart for details).  Clinical Course    Labs reviewed, imaging reviewed.  Pt's sob seems to be lower chest/upper abdominal pain with painful inspiration, preventing deep inspiration.  He has increased sob with exertion, states he feels back to his baseline sob immediately after the VATs procedure.  No upper abdominal source of pain per CT scan.  He is PERC negative, doubt PE.   He also states Dr. Gerarda Fraction wanted him admitted for a cardiac stress test and for sleep apnea study.  Sob is exertional.  Ambulated in dept after neb tx given, sob and desats to 89%.  Pt discussed with Dr. Thurnell Garbe who agrees with need for admission.    Final Clinical Impressions(s) / ED Diagnoses   Final diagnoses:  Hypoxia  Hypokalemia  Chest pain, unspecified chest pain type    New Prescriptions New Prescriptions   No medications on file     Evalee Jefferson, PA-C 08/26/15 [place], DO 08/28/15 1540

## 2015-08-26 NOTE — ED Notes (Addendum)
Pt made aware a urine specimen was needed. Pt verbalized understanding and will notify staff when one can be obtained.   

## 2015-08-27 ENCOUNTER — Observation Stay (HOSPITAL_BASED_OUTPATIENT_CLINIC_OR_DEPARTMENT_OTHER): Payer: Medicaid Other

## 2015-08-27 ENCOUNTER — Encounter (HOSPITAL_COMMUNITY): Payer: Self-pay | Admitting: Internal Medicine

## 2015-08-27 ENCOUNTER — Observation Stay (HOSPITAL_COMMUNITY): Payer: Medicaid Other

## 2015-08-27 DIAGNOSIS — J9601 Acute respiratory failure with hypoxia: Secondary | ICD-10-CM | POA: Diagnosis not present

## 2015-08-27 DIAGNOSIS — E038 Other specified hypothyroidism: Secondary | ICD-10-CM

## 2015-08-27 DIAGNOSIS — R079 Chest pain, unspecified: Secondary | ICD-10-CM

## 2015-08-27 DIAGNOSIS — K529 Noninfective gastroenteritis and colitis, unspecified: Secondary | ICD-10-CM | POA: Diagnosis not present

## 2015-08-27 DIAGNOSIS — J42 Unspecified chronic bronchitis: Secondary | ICD-10-CM

## 2015-08-27 DIAGNOSIS — G4733 Obstructive sleep apnea (adult) (pediatric): Secondary | ICD-10-CM

## 2015-08-27 DIAGNOSIS — E039 Hypothyroidism, unspecified: Secondary | ICD-10-CM

## 2015-08-27 DIAGNOSIS — J449 Chronic obstructive pulmonary disease, unspecified: Secondary | ICD-10-CM | POA: Diagnosis present

## 2015-08-27 DIAGNOSIS — R0609 Other forms of dyspnea: Secondary | ICD-10-CM | POA: Diagnosis not present

## 2015-08-27 HISTORY — DX: Hypothyroidism, unspecified: E03.9

## 2015-08-27 HISTORY — DX: Obstructive sleep apnea (adult) (pediatric): G47.33

## 2015-08-27 HISTORY — DX: Acute respiratory failure with hypoxia: J96.01

## 2015-08-27 LAB — BASIC METABOLIC PANEL
ANION GAP: 6 (ref 5–15)
BUN: 10 mg/dL (ref 6–20)
CHLORIDE: 101 mmol/L (ref 101–111)
CO2: 28 mmol/L (ref 22–32)
CREATININE: 1.02 mg/dL (ref 0.61–1.24)
Calcium: 8.2 mg/dL — ABNORMAL LOW (ref 8.9–10.3)
GFR calc non Af Amer: >60 mL/min (ref >60)
Glucose, Bld: 104 mg/dL — ABNORMAL HIGH (ref 65–99)
Potassium: 3.4 mmol/L — ABNORMAL LOW (ref 3.5–5.1)
SODIUM: 135 mmol/L (ref 135–145)

## 2015-08-27 LAB — ECHOCARDIOGRAM COMPLETE
AVLVOTPG: 9 mmHg
CHL CUP DOP CALC LVOT VTI: 29.3 cm
CHL CUP TV REG PEAK VELOCITY: 287 cm/s
E decel time: 162 msec
E/e' ratio: 11.05
FS: 32 % (ref 28–44)
HEIGHTINCHES: 72 in
IV/PV OW: 1.24
LA ID, A-P, ES: 29 mm
LA diam end sys: 29 mm
LA diam index: 1.13 cm/m2
LA vol index: 27.5 mL/m2
LA vol: 70.4 mL
LAVOLA4C: 59.2 mL
LDCA: 3.46 cm2
LV E/e'average: 11.05
LV TDI E'LATERAL: 9.68
LV TDI E'MEDIAL: 6.31
LV dias vol index: 31 mL/m2
LV e' LATERAL: 9.68 cm/s
LVDIAVOL: 80 mL (ref 62–150)
LVEEMED: 11.05
LVOT SV: 101 mL
LVOT peak vel: 147 cm/s
LVOTD: 21 mm
LVSYSVOL: 26 mL (ref 21–61)
LVSYSVOLIN: 10 mL/m2
MV Dec: 162
MV pk E vel: 107 m/s
MVPG: 5 mmHg
MVPKAVEL: 99.4 m/s
PW: 12.2 mm — AB (ref 0.6–1.1)
RV LATERAL S' VELOCITY: 15 cm/s
RV TAPSE: 26.7 mm
RV sys press: 36 mmHg
Simpson's disk: 68
Stroke v: 54 ml
TRMAXVEL: 287 cm/s
WEIGHTICAEL: 4387.2 [oz_av]

## 2015-08-27 LAB — CBC
HCT: 38.1 % — ABNORMAL LOW (ref 39.0–52.0)
HEMOGLOBIN: 13.1 g/dL (ref 13.0–17.0)
MCH: 32.8 pg (ref 26.0–34.0)
MCHC: 34.4 g/dL (ref 30.0–36.0)
MCV: 95.3 fL (ref 78.0–100.0)
PLATELETS: 200 10*3/uL (ref 150–400)
RBC: 4 MIL/uL — AB (ref 4.22–5.81)
RDW: 14.2 % (ref 11.5–15.5)
WBC: 6.7 10*3/uL (ref 4.0–10.5)

## 2015-08-27 LAB — TROPONIN I: Troponin I: <0.03 ng/mL (ref <0.03)

## 2015-08-27 LAB — MAGNESIUM: MAGNESIUM: 1.8 mg/dL (ref 1.7–2.4)

## 2015-08-27 MED ORDER — BUDESONIDE-FORMOTEROL FUMARATE 80-4.5 MCG/ACT IN AERO: 2.0000 | INHALATION_SPRAY | Freq: Two times a day (BID) | RESPIRATORY_TRACT | 6 refills | Status: DC

## 2015-08-27 MED ORDER — POTASSIUM CHLORIDE CRYS ER 20 MEQ PO TBCR
30.0000 meq | EXTENDED_RELEASE_TABLET | Freq: Once | ORAL | Status: AC
  Administered 2015-08-27: 30 meq via ORAL
  Filled 2015-08-27: qty 1

## 2015-08-27 MED ORDER — LEVOTHYROXINE SODIUM 25 MCG PO TABS
25.0000 ug | ORAL_TABLET | Freq: Every day | ORAL | Status: DC
  Administered 2015-08-27: 25 ug via ORAL
  Filled 2015-08-27: qty 1

## 2015-08-27 MED ORDER — POTASSIUM CHLORIDE ER 20 MEQ PO TBCR: 30.0000 meq | EXTENDED_RELEASE_TABLET | Freq: Every day | ORAL | 0 refills | Status: DC

## 2015-08-27 MED ORDER — LEVOTHYROXINE SODIUM 25 MCG PO TABS: 25.0000 ug | ORAL_TABLET | Freq: Every day | ORAL | 3 refills | Status: DC

## 2015-08-27 MED ORDER — ALBUTEROL SULFATE (2.5 MG/3ML) 0.083% IN NEBU
2.5000 mg | INHALATION_SOLUTION | Freq: Four times a day (QID) | RESPIRATORY_TRACT | Status: DC
  Administered 2015-08-27: 2.5 mg via RESPIRATORY_TRACT

## 2015-08-27 NOTE — Discharge Summary (Addendum)
Physician Discharge Summary  Fernando [name] [id number] DOB: 04/27/1960 DOA: 08/26/2015  PCP: Collene Mares, PA-C  Admit date: 08/26/2015 Discharge date: 08/27/2015  Time spent: GREATER THAN 30 minutes  Recommendations for Outpatient Follow-up:  1. Patient was discharged on 2 L of nasal cannula oxygen for an oxygen saturation of 88% on 08/27/15. 2. Obstructive sleep apnea is highly suspected; recommend scheduling of outpatient sleep study. 3. Newly diagnosed hypothyroidism; patient will a follow-up of his TSH and free T4 in 4-8 weeks. 4. Patient sent home on 30 mEq of potassium chloride daily. Would recommend checking his serum potassium in 1-2 weeks.    Discharge Diagnoses:  1. Dyspnea on exertion, likely secondary to underlying COPD and probable obstructive sleep apnea. 2. Acute hypoxic respiratory failure, likely from underlying COPD and probable obstructive apnea. 3. Probable obstructive sleep apnea; family reports that patient stops breathing intermittently fall sleeping. A diagnostic sleep study is needed for confirmation. 4. Left-sided chest pain, likely musculoskeletal. 5. Newly diagnosed hypothyroidism. 6. Obesity. 7. Hypokalemia. 8. Chronic anal fistula. 9. Chronic diarrhea, presumed to be from anal fistula. 10. Essential hypertension. 11. Mild pedal edema, likely from lower extremity venous insufficiency from obesity.  Discharge Condition: Improved  Diet recommendation: Heart healthy  Filed Weights   08/26/15 0906 08/26/15 1736 08/27/15 0433  Weight: 122.5 kg (270 lb) 122.5 kg (270 lb) 124.4 kg (274 lb 3.2 oz)    History of present illness:  Fernando [name] is a 55 y.o. male with medical history significant for left lower lobe lobectomy of lung 04/2015 for workup of a lung mass which turned out to be infection/abscess, chronic left-sided chest pain status post the lobectomy, anal fistula-status post perirectal abscess-being treated at [facility] by general surgery,  and hypertension, who presents to the ED with a chief complaint of shortness of breath at rest and with exertion and left-sided chest pain. He is also reported some abdominal bloating. Patient reported worsening dyspnea on exertion over the past several days. He admited to having intermittent shortness of breath and left-sided chest pain following the left lower lobe lung lobectomy in April 2017.  In the ED, he was afebrile and hemodynamically stable. His EKG revealed no suspicious ST or T-wave abnormalities. His chest x-ray revealed postoperative changes on the left with some volume loss, but no edema or consolidation. CT scan of his abdomen and pelvis with contrast revealed no acute findings; no bowel obstruction or bowel wall inflammation. His lab data were significant for a normal troponin I, normal BNP, normal d-dimer, and potassium of 2.9. He was further evaluation and management.  Hospital Course:   1. Dyspnea on exertion, likely from underlying COPD and probable obstructive sleep apnea causing acute respiratory failure with hypoxia. The patient had a left lower lobe lobectomy in April 2017. Per chart review, he was informed by his cardiothoracic surgeon, Dr. Koleen Nimrod that it will take some time for his exercise tolerance to be regained. His chest x-ray reveals some volume loss on the left, but no edema or infiltrates. His BNP and d-dimer were within normal limits. Although there was no prior documentation of COPD, he probably does have COPD given his history of tobacco use and his use of Symbicort and albuterol inhalers. He stopped smoking in February 2017. He reported that his mother says that he stops breathing when he is asleep. He has had some mild ankle edema intermittently. -Patient's chronic inhalers were continued. -With further evaluation, a number of studies were ordered. History: I was  negative 3. His echo revealed an EF of 60-65%, mild LVH, and normal diastolic function. Bilateral lower  extremity venous ultrasound revealed no evidence of DVT. His TSH was elevated, indicating hypothyroidism. He was started on Synthroid. -His oxygen saturation was assessed with ambulation and it fell to 88%, indicating hypoxic respiratory failure, though mild. I suspect he probably has underlying COPD and obstructive sleep apnea causing hypoxia. Home oxygen was ordered. -Would recommend an outpatient sleep study. This will be deferred to his PCP.  Left-sided chest pain. Per review of the patient's chart, he has a history of chronic left-sided chest pain and has been seen in the ED and/or admitted on a number of occasions because of it. His EKG and troponin I were reassuring. On exam, there was some reproducible chest pain, likely from chest wall pain. He was treated with analgesics as needed. His cardiac enzymes were cycled and were within normal limits. His 2-D echocardiogram revealed no LV wall motion abnormalities.  Essential hypertension. He is treated chronically with lisinopril. It was continued. His blood pressure remained stable.  Hypokalemia. Patient's serum potassium is 2.9 on admission. 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 He was started on potassium chloride supplementation. His magnesium level was within normal limits. He was discharged on daily dosing of potassium chloride for one month. Recommend rechecking his serum potassium in the outpatient setting.  Hypothyroidism. The patient's TSH was found to be elevated at 10.2. He was started on Synthroid. Would recommend a follow-up TSH and free T4 in 4-8 weeks.  Chronic anal fistula/fissure. Status post perirectal abscess several months ago. -Patient is followed by general surgery at [facility]; he will follow-up there as scheduled.    Procedures: 2-D echocardiogram 08/27/15:Study Conclusions - Left ventricle: The cavity size was normal. Wall thickness  was   increased in a pattern of mild LVH. Systolic function was normal.   The estimated ejection fraction was in the range of 60% to 65%.   Wall motion was normal; there were no regional wall motion   abnormalities. Left ventricular diastolic function parameters   were normal. - Aortic valve: Mildly calcified annulus. Trileaflet; mildly   thickened leaflets. Valve area (VTI): 3.12 cm^2. Valve area   (Vmax): 2.91 cm^2. Valve area (Vmean): 3.02 cm^2. - Mitral valve: There was mild regurgitation. - Atrial septum: No defect or patent foramen ovale was identified. - Pulmonary arteries: Systolic pressure was mildly increased. PA   peak pressure: 36 mm Hg (S).  - Technically adequate study.     Consultations:  None  Discharge Exam: Vitals:   08/27/15 0433 08/27/15 1403  BP: 126/90 124/89  Pulse: 83 70  Resp: 17   Temp: 98.1 F (36.7 C) 97.7 F (36.5 C)  Oxygen saturation on room air at rest 94% and with ambulation 88%.  General: Pleasant obese 55 year old man in no acute distress. Cardiovascular: S1, S2, no murmurs rubs or gallops. Respiratory: Decreased breath sounds at bases, otherwise clear.  Discharge Instructions   Discharge Instructions    Diet - low sodium heart healthy    Complete by:  As directed   Discharge instructions    Complete by:  As directed   You will need to have your potassium level rechecked in 1-2 weeks. He will need to have your thyroid function tests rechecked in 4-8 weeks. He will need to have a sleep study scheduled. This can be done by you primary care physician.  Increase activity slowly    Complete by:  As directed     Current Discharge Medication List    START taking these medications   Details  levothyroxine (SYNTHROID, LEVOTHROID) 25 MCG tablet Take 1 tablet (25 mcg total) by mouth daily before breakfast. Medication for your thyroid. Qty: 30 tablet, Refills: 3    potassium chloride 20 MEQ TBCR Take 30 mEq by mouth daily. TAKE 1 AND 1/2  TABLETS DAILY Qty: 45 tablet, Refills: 0      CONTINUE these medications which have CHANGED   Details  budesonide-formoterol (SYMBICORT) 80-4.5 MCG/ACT inhaler Inhale 2 puffs into the lungs 2 (two) times daily. Qty: 1 Inhaler, Refills: 6      CONTINUE these medications which have NOT CHANGED   Details  albuterol (PROVENTIL HFA;VENTOLIN HFA) 108 (90 Base) MCG/ACT inhaler Inhale 2 puffs into the lungs every 6 (six) hours as needed for wheezing or shortness of breath. Qty: 1 Inhaler, Refills: 12    ALPRAZolam (XANAX) 1 MG tablet Take 1 tablet (1 mg total) by mouth 3 (three) times daily as needed for anxiety. Qty: 30 tablet, Refills: 0    diltiazem 2 % GEL Apply 1 application topically daily.    lisinopril (PRINIVIL,ZESTRIL) 20 MG tablet Take 1 tablet (20 mg total) by mouth daily. Qty: 90 tablet, Refills: 3    oxyCODONE-acetaminophen (PERCOCET/ROXICET) 5-325 MG tablet Take 1 tablet by mouth every 6 (six) hours as needed for moderate pain or severe pain.    promethazine (PHENERGAN) 25 MG tablet Take 25 mg by mouth every 6 (six) hours as needed for nausea or vomiting.    Psyllium (METAMUCIL FIBER PO) Take 1-2 capsules by mouth 2 (two) times daily. 2 capsules in the morning and 1 capsule in the evening.      STOP taking these medications     oxyCODONE (OXY IR/ROXICODONE) 5 MG immediate release tablet        Allergies  Allergen Reactions  . Morphine And Related Other (See Comments)    hallucinations   Follow-up Information    MANN, BENJAMIN, PA-C. Schedule an appointment as soon as possible for a visit in 1 week(s).   Specialties:  Physician Assistant, Internal Medicine Contact information: [address] [place] [id number] [phone]        Glo Herring., MD Follow up in 1 week(s).   Specialty:  Internal Medicine Contact information: [address] [place] [id number] [phone]            The results of significant diagnostics from  this hospitalization (including imaging, microbiology, ancillary and laboratory) are listed below for reference.    Significant Diagnostic Studies: Dg Chest 2 View  Result Date: 08/26/2015 CLINICAL DATA:  Shortness of breath and cough EXAM: CHEST  2 VIEW COMPARISON:  July 06, 2015 FINDINGS: Mild elevation of the left hemidiaphragm is stable. There is no edema or consolidation. There is mild scarring in the left base. There is a clip in the left perihilar region. The heart size and pulmonary vascularity are normal. No adenopathy. There is mild degenerative change in the thoracic spine. There is postoperative change in the lower cervical region. IMPRESSION: Postoperative change on the left with volume loss. No edema or consolidation. Stable cardiac silhouette. Electronically Signed   By: Lowella Grip III M.D.   On: 08/26/2015 09:57   Ct Abdomen Pelvis W Contrast  Result Date: 08/26/2015 CLINICAL DATA:  Shortness of breath with abdominal pain and distention for 4 days. History of umbilical hernia  repair, anal fissure repair June 17th, appendectomy. EXAM: CT ABDOMEN AND PELVIS WITH CONTRAST TECHNIQUE: Multidetector CT imaging of the abdomen and pelvis was performed using the standard protocol following bolus administration of intravenous contrast. CONTRAST:  119mL ISOVUE-300 IOPAMIDOL (ISOVUE-300) INJECTION 61% COMPARISON:  CT abdomen dated 03/10/2015. FINDINGS: Lower chest:  No acute findings. Hepatobiliary: Liver and gallbladder appear normal. Pancreas: No mass, inflammatory changes, or other significant abnormality. Spleen: Within normal limits in size and appearance. Adrenals/Urinary Tract: Adrenal glands appear normal. Kidneys appear normal without mass, stone or hydronephrosis. No ureteral or bladder calculi identified. Bladder appears normal. Stomach/Bowel: Bowel is normal in caliber. No bowel wall thickening or evidence of bowel wall inflammation. Status post appendectomy. Stomach appears normal,  mildly distended with air and recently ingested material. Vascular/Lymphatic: No pathologically enlarged lymph nodes. No evidence of abdominal aortic aneurysm. Reproductive: Prostate gland mildly prominent in size causing slight mass effect on the bladder base. Otherwise unremarkable. Other: No free fluid or abscess collection seen. No free intraperitoneal air. Musculoskeletal: Mild degenerative change within the lumbar spine. No acute or suspicious osseous finding. 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 IMPRESSION: 1. No acute findings in the abdomen or pelvis. No bowel obstruction or evidence of bowel wall inflammation. No soft tissue mass or free fluid. No evidence of acute solid organ abnormality. No free intraperitoneal air. 2. Additional chronic/incidental findings detailed above. Electronically Signed   By: Franki Cabot M.D.   On: 08/26/2015 11:54   US Venous Img Lower Bilateral  Result Date: 08/27/2015 CLINICAL DATA:  Bilateral lower extremity edema. EXAM: BILATERAL LOWER EXTREMITY VENOUS DOPPLER ULTRASOUND TECHNIQUE: Gray-scale sonography with graded compression, as well as color Doppler and duplex ultrasound were performed to evaluate the lower extremity deep venous systems from the level of the common femoral vein and including the common femoral, femoral, profunda femoral, popliteal and calf veins including the posterior tibial, peroneal and gastrocnemius veins when visible. The superficial great saphenous vein was also interrogated. Spectral Doppler was utilized to evaluate flow at rest and with distal augmentation maneuvers in the common femoral, femoral and popliteal veins. COMPARISON:  None. FINDINGS: RIGHT LOWER EXTREMITY Common Femoral Vein: No evidence of thrombus. Normal compressibility, respiratory phasicity and response to augmentation. Saphenofemoral Junction: No evidence of thrombus. Normal compressibility and flow on  color Doppler imaging. Profunda Femoral Vein: No evidence of thrombus. Normal compressibility and flow on color Doppler imaging. Femoral Vein: No evidence of thrombus. Normal compressibility, respiratory phasicity and response to augmentation. Popliteal Vein: No evidence of thrombus. Normal compressibility, respiratory phasicity and response to augmentation. Calf Veins: No evidence of thrombus. Normal compressibility and flow on color Doppler imaging. Superficial Great Saphenous Vein: No evidence of thrombus. Normal compressibility and flow on color Doppler imaging. Venous Reflux:  None. Other Findings: No evidence of superficial thrombophlebitis or abnormal fluid collection. LEFT LOWER EXTREMITY Common Femoral Vein: No evidence of thrombus. Normal compressibility, respiratory phasicity and response to augmentation. Saphenofemoral Junction: No evidence of thrombus. Normal compressibility and flow on color Doppler imaging. Profunda Femoral Vein: No evidence of thrombus. Normal compressibility and flow on color Doppler imaging. Femoral Vein: No evidence of thrombus. Normal compressibility, respiratory phasicity and response to augmentation. Popliteal Vein: No evidence of thrombus. Normal compressibility, respiratory phasicity and response to augmentation. Calf Veins: No evidence of thrombus. Normal compressibility and flow on color Doppler imaging. Superficial Great Saphenous Vein: No evidence of thrombus. Normal compressibility and flow on color Doppler imaging. Venous Reflux:  None. Other Findings: No evidence of superficial thrombophlebitis or abnormal fluid collection. IMPRESSION: No evidence of bilateral lower extremity deep venous thrombosis. Electronically Signed   By: Aletta Edouard M.D.   On: 08/27/2015 09:11   Dg Abd 2 Views  Result Date: 08/26/2015 CLINICAL DATA:  Abdominal pain EXAM: ABDOMEN - 2 VIEW COMPARISON:  April 24, 2015 FINDINGS: Supine and upright images were obtained. There is moderate stool  throughout the colon. There is no appreciable bowel dilatation. However, there are multiple air-fluid levels scattered throughout the abdomen. No free air evident. Visualized lung bases are clear. There are old healed fractures of the posterior right tenth and eleventh ribs. IMPRESSION: Scattered air-fluid levels without bowel dilatation. Suspect early ileus or enteritis. Obstruction is felt to be less likely. No free air. Electronically Signed   By: Lowella Grip III M.D.   On: 08/26/2015 09:58    Microbiology: No results found for this or any previous visit (from the past 240 hour(s)).   Labs: Basic Metabolic Panel:  Recent Labs Lab 08/26/15 0918 08/26/15 1622 08/27/15 0550  NA 138  --  135  K 2.9*  --  3.4*  CL 100*  --  101  CO2 26  --  28  GLUCOSE 142*  --  104*  BUN 11  --  10  CREATININE 1.08  --  1.02  CALCIUM 8.3*  --  8.2*  MG  --  1.8 1.8   Liver Function Tests:  Recent Labs Lab 08/26/15 0918  AST 55*  ALT 54  ALKPHOS 78  BILITOT 0.6  PROT 7.2  ALBUMIN 3.5    Recent Labs Lab 08/26/15 0918  LIPASE 22   No results for input(s): AMMONIA in the last 168 hours. CBC:  Recent Labs Lab 08/26/15 0918 08/27/15 0550  WBC 7.5 6.7  NEUTROABS 4.8  --   HGB 14.0 13.1  HCT 40.9 38.1*  MCV 94.5 95.3  PLT 211 200   Cardiac Enzymes:  Recent Labs Lab 08/26/15 0918 08/26/15 1622 08/26/15 2219 08/27/15 0550  TROPONINI <0.03 <0.03 <0.03 <0.03   BNP: BNP (last 3 results)  Recent Labs  11/11/14 0003 05/12/15 1916 08/26/15 0917  BNP 33.0 27.0 28.0    ProBNP (last 3 results) No results for input(s): PROBNP in the last 8760 hours.  CBG: No results for input(s): GLUCAP in the last 168 hours.     Signed:  Hudson Majkowski MD.  [facility] 08/27/2015, 5:58 PM

## 2015-08-27 NOTE — Progress Notes (Signed)
*  PRELIMINARY RESULTS* Echocardiogram 2D Echocardiogram has been performed.  Samuel Germany 08/27/2015, 11:59 AM

## 2015-08-27 NOTE — Progress Notes (Addendum)
SATURATION QUALIFICATIONS: (This note is used to comply with regulatory documentation for home oxygen)  Patient Saturations on Room Air at Rest = 99%  Patient Saturations on Room Air while Ambulating = 85%  Patient Saturations Oxgen 2L  after Ambulating = 94%

## 2015-08-27 NOTE — Care Management Note (Signed)
Case Management Note  Patient Details  Name: Fernando [name] MRN: [id number] Date of Birth: 1960/05/01  Subjective/Objective:                  Pt is from home, ind with ADL's. He has PCP, Dr. Gerarda Fraction. He has transportation and no difficulty affording medications. He qualify's for supplemental oxygen at home. He has chosen [facility] from list of DME providers. Romualdo Bolk, of [facility], aware of referral and will obtain pt info from chart. She will deliver port tank to pt room prior to DC. Anticipate DC home today.   Action/Plan: No further CM needs.   Expected Discharge Date:  08/28/15               Expected Discharge Plan:  Home/Self Care  In-House Referral:  NA  Discharge planning Services  CM Consult  Post Acute Care Choice:  Durable Medical Equipment Choice offered to:  Patient  DME Arranged:  Oxygen DME Agency:  [place]:    [facility] Agency:     Status of Service:  Completed, signed off  If discussed at [place] of Stay Meetings, dates discussed:    Additional Comments:  Sherald Barge, RN 08/27/2015, 3:16 PM

## 2015-08-27 NOTE — Progress Notes (Signed)
Discharge instructions and prescriptions given, verbalized understanding, out in stable condition via w/c with staff. 

## 2015-09-15 ENCOUNTER — Encounter (HOSPITAL_COMMUNITY): Payer: Self-pay | Admitting: *Deleted

## 2015-09-22 ENCOUNTER — Encounter: Payer: Self-pay | Admitting: Neurology

## 2015-09-22 ENCOUNTER — Ambulatory Visit (INDEPENDENT_AMBULATORY_CARE_PROVIDER_SITE_OTHER): Payer: Medicaid Other | Admitting: Neurology

## 2015-09-22 VITALS — BP 122/80 | HR 82 | Resp 24 | Ht 73.0 in | Wt 273.0 lb

## 2015-09-22 DIAGNOSIS — G4733 Obstructive sleep apnea (adult) (pediatric): Secondary | ICD-10-CM

## 2015-09-22 DIAGNOSIS — M7989 Other specified soft tissue disorders: Secondary | ICD-10-CM

## 2015-09-22 DIAGNOSIS — E669 Obesity, unspecified: Secondary | ICD-10-CM

## 2015-09-22 DIAGNOSIS — J9601 Acute respiratory failure with hypoxia: Secondary | ICD-10-CM

## 2015-09-22 NOTE — Progress Notes (Signed)
Subjective:    Patient ID: Fernando [name] is a 55 y.o. male.  HPI     Star Age, MD, PhD [facility] [address] [place], [place] [address]  Dear Dr. Gerarda Fraction,   I saw your patient, Fernando [name], upon your kind request in my neurologic clinic today for initial consultation of his sleep disorder, in particular, reevaluation of his obstructive sleep apnea. The patient is unaccompanied today. As you know, Fernando [name] is a 55 year old right-handed gentleman with an underlying medical history of hypothyroidism, obesity, hypokalemia, hypertension, lower extremity edema, secondary to lower extremity venous insufficiency, recent hospitalization secondary to COPD exacerbation and acute hypoxic respiratory failure, who was previously diagnosed with obstructive sleep apnea and had a sleep study in 2012. Prior sleep study results are not available for my review today. I reviewed her office note from 09/08/2015, which you kindly included. 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 He was never on CPAP therapy, but apparently had a split-night sleep study was tried on CPAP during the sleep study, but did not tolerate it at the time. He never started home CPAP therapy at the time. He was recently hospitalized from 08/26/2015 through 08/27/2015. I reviewed the hospital records. He was discharged on supplemental oxygen at 2 L/m. Echocardiogram from 08/27/2015 showed EF of 60-65%, normal wall motion, mild mitral valve regurgitation.  He has O2 at home at 2 lpm and has been on it 24-7.  He would be willing to try CPAP at this time. His ESS is 13/24 and FSS is 44/63.  He stopped smoking in 2/17 and is s/p LLL lobectomy, which was non-cancerous, thankfully, severe infection. He does not drink alcohol. He drinks little caffeine, not daily.  He has gained weight. Nocturia is once a night. He tries to go to bed at 8:30 or  9 when he can now, has not been working since 2/17. He has been a Administrator for 32 years and had to adjust to a very variable sleep schedule. He is the oldest of 3 siblings, he has 2 younger sisters. 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 He does not have a family history of obstructive sleep apnea but suspects that his father may have it.  His Past Medical History Is Significant For: Past Medical History:  Diagnosis Date  . Acute respiratory failure with hypoxia (HCC) 08/27/2015   Mild-Oxygen saturation 88% with ambulation.  . Allergy   . Anal fissure   . 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 Complication of anesthesia    woke up during colonoscopy  . HCAP (healthcare-associated pneumonia) 05/12/2015  . Hypertension 12/30/2011  . Hypothyroidism 08/27/2015   New diagnosis  . Loose stools   . Lung mass    hx of left lower benign mass  . Obstructive sleep apnea 08/27/2015   Per history only; not confirmed with a sleep study yet.  Marland Kitchen Peri-rectal abscess    multiple.   . Pneumonia   . Shortness of breath dyspnea    with exertion  . 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 Tobacco abuse   . Umbilical hernia    has been repaired    His Past Surgical History Is Significant For: Past Surgical History:  Procedure Laterality Date  . APPENDECTOMY    . CERVICAL FUSION    . COLONOSCOPY    .  CYSTOSCOPY N/A 05/01/2013   Procedure: CYSTOSCOPY;  Surgeon: Marissa Nestle, MD;  Location: [facility];  Service: Urology;  Laterality: N/A;  . EVALUATION UNDER ANESTHESIA WITH FISTULECTOMY N/A 06/23/2015   Procedure: ANAL EXAM UNDER ANESTHESIA  FISTULOTOMY;  Surgeon: Leighton Ruff, MD;  Location: [facility];  Service: General;  Laterality: N/A;  . FACIAL COSMETIC SURGERY    . HERNIA REPAIR    . INCISION AND DRAINAGE ABSCESS Right 05/01/2013   Procedure: INCISION AND DRAINAGE RIGHT SCROTAL ABSCESS;  Surgeon: Marissa Nestle, MD;  Location: [facility];  Service:  Urology;  Laterality: Right;  . INCISION AND DRAINAGE PERIRECTAL ABSCESS    . KNEE ARTHROSCOPY    . SPINE SURGERY     cervical  . VIDEO ASSISTED THORACOSCOPY (VATS)/ LOBECTOMY Left 05/03/2015   Procedure: LEFT VIDEO ASSISTED THORACOSCOPY (VATS)/ LEFT LOWER LOBECTOMY, ON-Q CATHETER PLACEMENT;  Surgeon: Melrose Nakayama, MD;  Location: [facility];  Service: Thoracic;  Laterality: Left;  Marland Kitchen VIDEO BRONCHOSCOPY WITH ENDOBRONCHIAL ULTRASOUND N/A 04/22/2015   Procedure: VIDEO BRONCHOSCOPY WITH ENDOBRONCHIAL ULTRASOUND;  Surgeon: Melrose Nakayama, MD;  Location: [place];  Service: Thoracic;  Laterality: N/A;    His Family History Is Significant For: Family History  Problem Relation Age of Onset  . Breast cancer Mother   . Thyroid disease Mother     His Social History Is Significant For: Social History   Social History  . Marital status: Divorced    Spouse name: N/A  . Number of children: 2  . Years of education: 10   Occupational History  . N/A    Social History Main Topics  . Smoking status: Former Smoker    Packs/day: 1.50    Years: 15.00    Types: Cigarettes    Quit date: 03/05/2015  . Smokeless tobacco: Never Used  . Alcohol use No     Comment: occ  . Drug use: No  . Sexual activity: No   Other Topics Concern  . None   Social History Narrative   Rare caffeine use     His Allergies Are:  Allergies  Allergen Reactions  . Morphine And Related Other (See Comments)    hallucinations  :   His Current Medications Are:  Outpatient Encounter Prescriptions as of 09/22/2015  Medication Sig  . albuterol (PROVENTIL HFA;VENTOLIN HFA) 108 (90 Base) MCG/ACT inhaler Inhale 2 puffs into the lungs every 6 (six) hours as needed for wheezing or shortness of breath.  . ALPRAZolam (XANAX) 1 MG tablet Take 1 tablet (1 mg total) by mouth 3 (three) times daily as needed for anxiety.  . budesonide-formoterol (SYMBICORT) 80-4.5 MCG/ACT inhaler Inhale 2 puffs into the lungs 2 (two) times  daily.  Marland Kitchen diltiazem 2 % GEL Apply 1 application topically daily.  Marland Kitchen levothyroxine (SYNTHROID, LEVOTHROID) 25 MCG tablet Take 1 tablet (25 mcg total) by mouth daily before breakfast. Medication for your thyroid.  Marland Kitchen lisinopril-hydrochlorothiazide (PRINZIDE,ZESTORETIC) 20-12.5 MG tablet Take 1 tablet by mouth daily.  Marland Kitchen oxyCODONE-acetaminophen (PERCOCET/ROXICET) 5-325 MG tablet Take 1 tablet by mouth every 6 (six) hours as needed for moderate pain or severe pain.  . potassium chloride 20 MEQ TBCR Take 30 mEq by mouth daily. TAKE 1 AND 1/2 TABLETS DAILY  . promethazine (PHENERGAN) 25 MG tablet Take 25 mg by mouth every 6 (six) hours as needed for nausea or vomiting.  . Psyllium (METAMUCIL FIBER PO) Take 1-2 capsules by mouth 2 (two) times daily. 2 capsules in the morning and 1  capsule in the evening.  . [DISCONTINUED] lovastatin (MEVACOR) 20 MG tablet Take 20 mg by mouth at bedtime.  . [DISCONTINUED] saccharomyces boulardii (FLORASTOR) 250 MG capsule Take 250 mg by mouth 2 (two) times daily.   No facility-administered encounter medications on file as of 09/22/2015.   :  Review of Systems:  Out of a complete 14 point review of systems, all are reviewed and negative with the exception of these symptoms as listed below: Review of Systems  Constitutional: Positive for fatigue.       Weight gain   Respiratory: Positive for shortness of breath.   Cardiovascular: Positive for leg swelling.  Neurological:       Patient states that he had sleep study in 2012. Says that he was diagnosed with sleep apnea, but was unable to tolerate the CPAP mask. Patient never started CPAP treatment.  H/O of being a truck driver and worked several types of shifts.  Patient has trouble staying asleep, takes a xanax at night to help him sleep but wakes at 2am and will stay awake, snoring, witnessed apnea, daytime fatigue, takes naps.   Psychiatric/Behavioral:       Decreased energy   Epworth Sleepiness Scale 0= would never  doze 1= slight chance of dozing 2= moderate chance of dozing 3= high chance of dozing  Sitting and reading:1 Watching TV:3 Sitting inactive in a public place (ex. Theater or meeting):2 As a passenger in a car for an hour without a break:2 Lying down to rest in the afternoon:3 Sitting and talking to someone:1 Sitting quietly after lunch (no alcohol):1 In a car, while stopped in traffic:0 Total:13  Objective:  Neurologic Exam  Physical Exam Physical Examination:   Vitals:   09/22/15 1016  BP: 122/80  Pulse: 82  Resp: (!) 24   General Examination: The patient is a very pleasant 55 y.o. male in no acute distress. He appears well-developed and well-nourished and adequately groomed.   HEENT: Normocephalic, atraumatic, pupils are equal, round and reactive to light and accommodation. Funduscopic exam is normal with sharp disc margins noted. Extraocular tracking is good without limitation to gaze excursion or nystagmus noted. Normal smooth pursuit is noted. Hearing is grossly intact. Face is symmetric with normal facial animation and normal facial sensation. Speech is clear with no dysarthria noted. There is no hypophonia. There is no lip, neck/head, jaw or voice tremor. Neck is supple with full range of passive and active motion. There are no carotid bruits on auscultation. Oropharynx exam reveals: mild mouth dryness, adequate dental hygiene and marked airway crowding, due to large tonsils and thick swollen uvula. Mallampati is class III. Tongue protrudes centrally and palate elevates symmetrically. Tonsils are 3+ in size. Neck size is 17 7/8 inches. He has a Mild overbite. Nasal inspection reveals no significant nasal mucosal bogginess or redness and no septal deviation.   Chest: Clear to auscultation without wheezing, rhonchi or crackles noted.  Heart: S1+S2+0, regular and normal without murmurs, rubs or gallops noted.   Abdomen: Soft, non-tender and non-distended with normal bowel  sounds appreciated on auscultation.  Extremities: There is 1+ pitting edema in the distal lower extremities bilaterally. Pedal pulses are intact.  Skin: Warm and dry without trophic changes noted. There are no varicose veins.  Musculoskeletal: exam reveals no obvious joint deformities, tenderness or joint swelling or erythema.   Neurologically:  Mental status: The patient is awake, alert and oriented in all 4 spheres. His immediate and remote memory, attention, language skills and fund  of knowledge are appropriate. There is no evidence of aphasia, agnosia, apraxia or anomia. Speech is clear with normal prosody and enunciation. Thought process is linear. Mood is constricted and affect is blunted.  Cranial nerves II - XII are as described above under HEENT exam. In addition: shoulder shrug is normal with equal shoulder height noted. Motor exam: Normal bulk, strength and tone is noted. There is no drift, tremor or rebound. Romberg is negative. Reflexes are 1+ throughout. Fine motor skills and coordination: intact with normal finger taps, normal hand movements, normal rapid alternating patting, normal foot taps and normal foot agility.  Cerebellar testing: No dysmetria or intention tremor on finger to nose testing. Heel to shin is unremarkable bilaterally. There is no truncal or gait ataxia.  Sensory exam: intact to light touch, pinprick, vibration, temperature sense in the upper and lower extremities.  Gait, station and balance: He stands easily. No veering to one side is noted. No leaning to one side is noted. Posture is age-appropriate and stance is narrow based. Gait shows normal stride length and normal pace. No problems turning are noted. Tandem walk is not possible.                Assessment and Plan:  In summary, Fernando [name] is a very pleasant 55 y.o.-year old male whose history and physical exam concerning for obstructive sleep apnea (OSA). I had a long chat with the patient about my  findings and the diagnosis of OSA, its prognosis and treatment options. We talked about medical treatments, surgical interventions and non-pharmacological approaches. I explained in particular the risks and ramifications of untreated moderate to severe OSA, especially with respect to developing cardiovascular disease down the Road, including congestive heart failure, difficult to treat hypertension, cardiac arrhythmias, or stroke. Even type 2 diabetes has, in part, been linked to untreated OSA. Symptoms of untreated OSA include daytime sleepiness, memory problems, mood irritability and mood disorder such as depression and anxiety, lack of energy, as well as recurrent headaches, especially morning headaches. We talked about trying to maintain a  healthy lifestyle in general, as well as the importance of weight control. I encouraged the patient to eat healthy, exercise daily and keep well hydrated, to keep a scheduled bedtime and wake time routine, to not skip any meals and eat healthy snacks in between meals. I advised the patient not to drive when feeling sleepy. I recommended the following at this time: sleep study with potential positive airway pressure titration. (We will score hypopneas at 4% and split the sleep study into diagnostic and treatment portion, if the estimated. 2 hour AHI is >15/h).   I explained the sleep test procedure to the patient and also outlined possible surgical and non-surgical treatment options of OSA, including the use of a custom-made dental device (which would require a referral to a specialist dentist or oral surgeon), upper airway surgical options, such as pillar implants, radiofrequency surgery, tongue base surgery, and UPPP (which would involve a referral to an ENT surgeon). Rarely, jaw surgery such as mandibular advancement may be considered.  I also explained the CPAP treatment option to the patient, who indicated that he would be willing to try CPAP if the need arises. I  explained the importance of being compliant with PAP treatment, not only for insurance purposes but primarily to improve His symptoms, and for the patient's long term health benefit, including to reduce His cardiovascular risks. I answered all his questions today and the patient was in agreement.  I would like to see him back after the sleep study is completed and encouraged him to call with any interim questions, concerns, problems or updates.   Thank you very much for allowing me to participate in the care of this nice patient. If I can be of any further assistance to you please do not hesitate to call me at [phone].  Sincerely,   Star Age, MD, PhD

## 2015-09-22 NOTE — Patient Instructions (Signed)
Based on your symptoms and your exam I believe you are still at risk for obstructive sleep apnea or OSA, and I think we should proceed with a sleep study to determine whether you do or do not have OSA and how severe it is. If you have more than mild OSA, I want you to consider treatment with CPAP. Please remember, the risks and ramifications of moderate to severe obstructive sleep apnea or OSA are: Cardiovascular disease, including congestive heart failure, stroke, difficult to control hypertension, arrhythmias, and even type 2 diabetes has been linked to untreated OSA. Sleep apnea causes disruption of sleep and sleep deprivation in most cases, which, in turn, can cause recurrent headaches, problems with memory, mood, concentration, focus, and vigilance. Most people with untreated sleep apnea report excessive daytime sleepiness, which can affect their ability to drive. Please do not drive if you feel sleepy.   I will likely see you back after your sleep study to go over the test results and where to go from there. We will call you after your sleep study to advise about the results (most likely, you will hear from Diana, my nurse) and to set up an appointment at the time, as necessary.    Our sleep lab administrative assistant, Dawn will meet with you or call you to schedule your sleep study. If you don't hear back from her by next week please feel free to call her at [phone]. This is her direct line and please leave a message with your phone number to call back if you get the voicemail box. She will call back as soon as possible.   

## 2015-09-30 ENCOUNTER — Encounter: Payer: Self-pay | Admitting: *Deleted

## 2015-09-30 ENCOUNTER — Ambulatory Visit (INDEPENDENT_AMBULATORY_CARE_PROVIDER_SITE_OTHER): Payer: Medicaid Other | Admitting: Cardiovascular Disease

## 2015-09-30 ENCOUNTER — Encounter: Payer: Self-pay | Admitting: Cardiovascular Disease

## 2015-09-30 VITALS — BP 112/90 | HR 87 | Ht 72.0 in | Wt 268.0 lb

## 2015-09-30 DIAGNOSIS — I10 Essential (primary) hypertension: Secondary | ICD-10-CM

## 2015-09-30 DIAGNOSIS — R079 Chest pain, unspecified: Secondary | ICD-10-CM

## 2015-09-30 NOTE — Progress Notes (Signed)
SUBJECTIVE: 55 year old male presents for evaluation of chest pain. Seen by Dr. Harrington Challenger for preoperative cardiac risk evaluation in April 2017. No significant coronary artery disease by angiography in 2011. He has COPD and chronic exertional dyspnea. He has a former history of tobacco abuse.  ECG 8/17 showed sinus rhythm with possible old inferior infarct.  Echocardiogram 08/27/15 normal left ventricular systolic function, LVEF [id number], normal regional wall motion, mild mitral regurgitation.  He has had chest pain since February. It may last up to an hour. It can occur both with and without exertion. He quit smoking in February. Uses oxygen 24/7.    Review of Systems: As per "subjective", otherwise negative.  Allergies  Allergen Reactions  . Morphine And Related Other (See Comments)    hallucinations    Current Outpatient Prescriptions  Medication Sig Dispense Refill  . albuterol (PROVENTIL HFA;VENTOLIN HFA) 108 (90 Base) MCG/ACT inhaler Inhale 2 puffs into the lungs every 6 (six) hours as needed for wheezing or shortness of breath. 1 Inhaler 12  . ALPRAZolam (XANAX) 1 MG tablet Take 1 tablet (1 mg total) by mouth 3 (three) times daily as needed for anxiety. 30 tablet 0  . diltiazem 2 % GEL Apply 1 application topically daily.    Marland Kitchen levothyroxine (SYNTHROID, LEVOTHROID) 25 MCG tablet Take 1 tablet (25 mcg total) by mouth daily before breakfast. Medication for your thyroid. 30 tablet 3  . lisinopril-hydrochlorothiazide (PRINZIDE,ZESTORETIC) 20-12.5 MG tablet Take 1 tablet by mouth daily.    Marland Kitchen oxyCODONE-acetaminophen (PERCOCET/ROXICET) 5-325 MG tablet Take 1 tablet by mouth every 6 (six) hours as needed for moderate pain or severe pain.    . potassium chloride 20 MEQ TBCR Take 30 mEq by mouth daily. TAKE 1 AND 1/2 TABLETS DAILY (Patient taking differently: Take 20 mEq by mouth daily. TAKE 1 TABLET DAILY) 45 tablet 0  . promethazine (PHENERGAN) 25 MG tablet Take 25 mg by mouth  every 6 (six) hours as needed for nausea or vomiting.    . Psyllium (METAMUCIL FIBER PO) Take 1-2 capsules by mouth 2 (two) times daily. 2 capsules in the morning and 1 capsule in the evening.     No current facility-administered medications for this visit.     Past Medical History:  Diagnosis Date  . Acute respiratory failure with hypoxia (HCC) 08/27/2015   Mild-Oxygen saturation 88% with ambulation.  . Allergy   . Anal fissure   . 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 Complication of anesthesia    woke up during colonoscopy  . HCAP (healthcare-associated pneumonia) 05/12/2015  . Hypertension 12/30/2011  . Hypothyroidism 08/27/2015   New diagnosis  . Loose stools   . Lung mass    hx of left lower benign mass  . Obstructive sleep apnea 08/27/2015   Per history only; not confirmed with a sleep study yet.  Marland Kitchen Peri-rectal abscess    multiple.   . Pneumonia   . Shortness of breath dyspnea    with exertion  . 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 Tobacco abuse   . Umbilical hernia    has been repaired    Past Surgical History:  Procedure Laterality Date  . APPENDECTOMY    . CERVICAL FUSION    . COLONOSCOPY    . CYSTOSCOPY N/A 05/01/2013   Procedure: CYSTOSCOPY;  Surgeon: Marissa Nestle, MD;  Location: [facility];  Service: Urology;  Laterality: N/A;  . EVALUATION UNDER ANESTHESIA WITH FISTULECTOMY  N/A 06/23/2015   Procedure: ANAL EXAM UNDER ANESTHESIA  FISTULOTOMY;  Surgeon: Leighton Ruff, MD;  Location: [facility];  Service: General;  Laterality: N/A;  . FACIAL COSMETIC SURGERY    . HERNIA REPAIR    . INCISION AND DRAINAGE ABSCESS Right 05/01/2013   Procedure: INCISION AND DRAINAGE RIGHT SCROTAL ABSCESS;  Surgeon: Marissa Nestle, MD;  Location: [facility];  Service: Urology;  Laterality: Right;  . INCISION AND DRAINAGE PERIRECTAL ABSCESS    . KNEE ARTHROSCOPY    . SPINE SURGERY     cervical  . VIDEO ASSISTED THORACOSCOPY (VATS)/ LOBECTOMY Left 05/03/2015   Procedure:  LEFT VIDEO ASSISTED THORACOSCOPY (VATS)/ LEFT LOWER LOBECTOMY, ON-Q CATHETER PLACEMENT;  Surgeon: Melrose Nakayama, MD;  Location: [facility];  Service: Thoracic;  Laterality: Left;  Marland Kitchen VIDEO BRONCHOSCOPY WITH ENDOBRONCHIAL ULTRASOUND N/A 04/22/2015   Procedure: VIDEO BRONCHOSCOPY WITH ENDOBRONCHIAL ULTRASOUND;  Surgeon: Melrose Nakayama, MD;  Location: [facility];  Service: Thoracic;  Laterality: N/A;    Social History   Social History  . Marital status: Divorced    Spouse name: N/A  . Number of children: 2  . Years of education: 10   Occupational History  . N/A    Social History Main Topics  . Smoking status: Former Smoker    Packs/day: 1.50    Years: 15.00    Types: Cigarettes    Quit date: 03/05/2015  . Smokeless tobacco: Never Used  . Alcohol use No     Comment: occ  . Drug use: No  . Sexual activity: No   Other Topics Concern  . Not on file   Social History Narrative   Rare caffeine use      Vitals:   09/30/15 1050  BP: 112/90  Pulse: 87  SpO2: 98%  Weight: 268 lb (121.6 kg)  Height: 6' (1.829 m)    PHYSICAL EXAM General: NAD, using O2 by nasal cannula HEENT: Normal. Neck: No JVD, no thyromegaly. Lungs: Clear to auscultation bilaterally. CV: Nondisplaced PMI.  Regular rate and rhythm, normal S1/S2, no S3/S4, no murmur. No pretibial or periankle edema.  Abdomen: Obese  Neurologic: Alert and oriented.  Psych: Normal affect. Skin: Normal. Musculoskeletal: No gross deformities.    ECG: Most recent ECG reviewed.      ASSESSMENT AND PLAN: 1. Chest pain: Doubt he has had progression since angiogram in 2011. Symptoms appear atypical. However to be certain, will obtain dobutamine Myoview.  2. HTN: Reasonably controlled. No changes.  Dispo: fu to be determined.  Time spent: 40 minutes, of which greater than 50% was spent reviewing symptoms, relevant blood tests and studies, and discussing management plan with the patient.   Kate Sable, M.D.,  F.A.C.C.

## 2015-09-30 NOTE — Patient Instructions (Signed)
Medication Instructions:  Continue all current medications.  Labwork: none  Testing/Procedures:  Your physician has requested that you have a dobutamine myoview. For furth information please visit [email or web address]. Please follow instruction sheet, as given.  Office will contact with results via phone or letter.    Follow-Up: To be determined based on test results.   Any Other Special Instructions Will Be Listed Below (If Applicable).  If you need a refill on your cardiac medications before your next appointment, please call your pharmacy.

## 2015-10-04 ENCOUNTER — Encounter (HOSPITAL_COMMUNITY)
Admission: RE | Admit: 2015-10-04 | Discharge: 2015-10-04 | Disposition: A | Discharge status: Home / Self Care | Payer: Medicaid Other | Source: Ambulatory Visit | Attending: Cardiovascular Disease | Admitting: Cardiovascular Disease

## 2015-10-04 ENCOUNTER — Inpatient Hospital Stay (HOSPITAL_COMMUNITY): Admission: RE | Admit: 2015-10-04 | Payer: Medicaid Other | Source: Ambulatory Visit

## 2015-10-04 ENCOUNTER — Encounter (HOSPITAL_COMMUNITY): Payer: Self-pay

## 2015-10-04 ENCOUNTER — Encounter (HOSPITAL_BASED_OUTPATIENT_CLINIC_OR_DEPARTMENT_OTHER)
Admission: RE | Admit: 2015-10-04 | Discharge: 2015-10-04 | Disposition: A | Discharge status: Home / Self Care | Payer: Medicaid Other | Source: Ambulatory Visit | Attending: Cardiovascular Disease | Admitting: Cardiovascular Disease

## 2015-10-04 DIAGNOSIS — R079 Chest pain, unspecified: Secondary | ICD-10-CM | POA: Insufficient documentation

## 2015-10-04 LAB — NM MYOCAR MULTI W/SPECT W/WALL MOTION / EF
CHL CUP NUCLEAR SDS: 0
CHL CUP NUCLEAR SRS: 1
CHL CUP RESTING HR STRESS: 73 {beats}/min
LHR: 0.31
LV dias vol: 78 mL (ref 62–150)
LV sys vol: 31 mL
NUC STRESS TID: 1.08
Peak HR: 144 {beats}/min
SSS: 1

## 2015-10-04 MED ORDER — DOBUTAMINE IN D5W 4-5 MG/ML-% IV SOLN
INTRAVENOUS | Status: AC
  Administered 2015-10-04: 1000000 ug via INTRAVENOUS
  Filled 2015-10-04: qty 250

## 2015-10-04 MED ORDER — TECHNETIUM TC 99M TETROFOSMIN IV KIT
30.0000 | PACK | Freq: Once | INTRAVENOUS | Status: AC | PRN
  Administered 2015-10-04: 30 via INTRAVENOUS

## 2015-10-04 MED ORDER — TECHNETIUM TC 99M TETROFOSMIN IV KIT
10.0000 | PACK | Freq: Once | INTRAVENOUS | Status: AC | PRN
  Administered 2015-10-04: 10 via INTRAVENOUS

## 2015-10-05 ENCOUNTER — Telehealth: Payer: Self-pay | Admitting: *Deleted

## 2015-10-05 ENCOUNTER — Encounter (HOSPITAL_COMMUNITY): Payer: Medicaid Other

## 2015-10-05 NOTE — Telephone Encounter (Signed)
Notes Recorded by Laurine Blazer, LPN on [id number] at [id number] AM EDT Patient notified and verbalized understanding. Copy to pmd. ------  Notes Recorded by Herminio Commons, MD on 10/04/2015 at 3:59 PM EDT Normal.

## 2016-04-11 ENCOUNTER — Telehealth: Payer: Self-pay

## 2016-04-11 NOTE — Telephone Encounter (Signed)
Pt called on 10-25-15, 11-08-15, 11-18-15 and LVM to schedule sleep study. No return call back.

## 2016-05-01 ENCOUNTER — Encounter (HOSPITAL_COMMUNITY): Payer: Self-pay | Admitting: Emergency Medicine

## 2016-05-01 ENCOUNTER — Emergency Department (HOSPITAL_COMMUNITY): Payer: 59

## 2016-05-01 ENCOUNTER — Emergency Department (HOSPITAL_COMMUNITY)
Admission: EM | Admit: 2016-05-01 | Discharge: 2016-05-01 | Disposition: A | Discharge status: Home / Self Care | Payer: 59 | Attending: Emergency Medicine | Admitting: Emergency Medicine

## 2016-05-01 DIAGNOSIS — E039 Hypothyroidism, unspecified: Secondary | ICD-10-CM | POA: Insufficient documentation

## 2016-05-01 DIAGNOSIS — R0789 Other chest pain: Secondary | ICD-10-CM | POA: Diagnosis not present

## 2016-05-01 DIAGNOSIS — R079 Chest pain, unspecified: Secondary | ICD-10-CM

## 2016-05-01 DIAGNOSIS — Z87891 Personal history of nicotine dependence: Secondary | ICD-10-CM | POA: Diagnosis not present

## 2016-05-01 DIAGNOSIS — I10 Essential (primary) hypertension: Secondary | ICD-10-CM | POA: Insufficient documentation

## 2016-05-01 DIAGNOSIS — J449 Chronic obstructive pulmonary disease, unspecified: Secondary | ICD-10-CM | POA: Insufficient documentation

## 2016-05-01 DIAGNOSIS — Z79899 Other long term (current) drug therapy: Secondary | ICD-10-CM | POA: Insufficient documentation

## 2016-05-01 LAB — I-STAT TROPONIN, ED
TROPONIN I, POC: 0 ng/mL (ref 0.00–0.08)
Troponin i, poc: 0.01 ng/mL (ref 0.00–0.08)

## 2016-05-01 LAB — BASIC METABOLIC PANEL
Anion gap: 9 (ref 5–15)
BUN: 9 mg/dL (ref 6–20)
CALCIUM: 8.9 mg/dL (ref 8.9–10.3)
CO2: 28 mmol/L (ref 22–32)
Chloride: 99 mmol/L — ABNORMAL LOW (ref 101–111)
Creatinine, Ser: 1.08 mg/dL (ref 0.61–1.24)
GFR calc Af Amer: >60 mL/min (ref >60)
GLUCOSE: 101 mg/dL — AB (ref 65–99)
Potassium: 3.3 mmol/L — ABNORMAL LOW (ref 3.5–5.1)
Sodium: 136 mmol/L (ref 135–145)

## 2016-05-01 LAB — D-DIMER, QUANTITATIVE (NOT AT ARMC)

## 2016-05-01 LAB — CBC
HEMATOCRIT: 44.1 % (ref 39.0–52.0)
Hemoglobin: 15.6 g/dL (ref 13.0–17.0)
MCH: 33.3 pg (ref 26.0–34.0)
MCHC: 35.4 g/dL (ref 30.0–36.0)
MCV: 94 fL (ref 78.0–100.0)
PLATELETS: 224 10*3/uL (ref 150–400)
RBC: 4.69 MIL/uL (ref 4.22–5.81)
RDW: 14 % (ref 11.5–15.5)
WBC: 10.9 10*3/uL — AB (ref 4.0–10.5)

## 2016-05-01 MED ORDER — HYDROMORPHONE HCL 1 MG/ML IJ SOLN
1.0000 mg | Freq: Once | INTRAMUSCULAR | Status: AC
  Administered 2016-05-01: 1 mg via INTRAVENOUS
  Filled 2016-05-01: qty 1

## 2016-05-01 MED ORDER — IOPAMIDOL (ISOVUE-370) INJECTION 76%
100.0000 mL | Freq: Once | INTRAVENOUS | Status: AC | PRN
  Administered 2016-05-01: 100 mL via INTRAVENOUS

## 2016-05-01 MED ORDER — IBUPROFEN 600 MG PO TABS: 600.0000 mg | ORAL_TABLET | Freq: Four times a day (QID) | ORAL | 0 refills | Status: DC | PRN

## 2016-05-01 MED ORDER — HYDROMORPHONE HCL 2 MG PO TABS
1.0000 mg | ORAL_TABLET | Freq: Once | ORAL | Status: AC
  Administered 2016-05-01: 1 mg via ORAL
  Filled 2016-05-01: qty 1

## 2016-05-01 MED ORDER — KETOROLAC TROMETHAMINE 30 MG/ML IJ SOLN
30.0000 mg | Freq: Once | INTRAMUSCULAR | Status: AC
  Administered 2016-05-01: 30 mg via INTRAVENOUS
  Filled 2016-05-01: qty 1

## 2016-05-01 NOTE — ED Provider Notes (Signed)
[facility] DEPT Provider Note   CSN: [id number] Arrival date & time: 05/01/16  0910     History   Chief Complaint Chief Complaint  Patient presents with  . Chest Pain    HPI Fernando [name] is a 56 y.o. male.  HPI  56 y.o. male with a hx of HTN, OSA, Lobectomy LLL, presents to the Emergency Department today complaining of new onset cough with productive sputum x 1 week. No fevers. No URI symptoms. Notes right sided chest discomfort with shortness of breath. Worse with movement, rest, exertion, and coughing. Noted worsening last night. States pain is constant. No N/v. No diaphoresis. No hx ACS. No FH. No hx DVT/PE. No recent surgeries. Noted travel as truck driver. Pain occurred after trip to [place]. Rates pain 10/10. No meds PTA. No other symptoms noted.   Past Medical History:  Diagnosis Date  . Acute respiratory failure with hypoxia (HCC) 08/27/2015   Mild-Oxygen saturation 88% with ambulation.  . Allergy   . Anal fissure   . 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 Complication of anesthesia    woke up during colonoscopy  . HCAP (healthcare-associated pneumonia) 05/12/2015  . Hypertension 12/30/2011  . Hypothyroidism 08/27/2015   New diagnosis  . Loose stools   . Lung mass    hx of left lower benign mass  . Obstructive sleep apnea 08/27/2015   Per history only; not confirmed with a sleep study yet.  Marland Kitchen Peri-rectal abscess    multiple.   . Pneumonia   . Shortness of breath dyspnea    with exertion  . 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 Tobacco abuse   . Umbilical hernia    has been repaired    Patient Active Problem List   Diagnosis Date Noted  . Hypothyroidism 08/27/2015  . Acute respiratory failure with hypoxia ([place]) 08/27/2015  . COPD (chronic obstructive pulmonary disease) ([place]) 08/27/2015  . Obstructive sleep apnea 08/27/2015  . Dyspnea on exertion 08/26/2015  . Left sided chest pain 08/26/2015  . Obesity 08/26/2015  . Anal fistula 08/26/2015  . Pedal edema  08/26/2015  . Hypokalemia 08/26/2015  . Chronic diarrhea 08/26/2015  . Essential hypertension 08/26/2015  . Pain in the chest   . Chest pain 05/12/2015  . HCAP (healthcare-associated pneumonia) 05/12/2015  . Nodule of left lung 05/03/2015  . Lung mass 03/30/2015  . Peri-rectal abscess 03/07/2015  . Abnormal CXR 03/07/2015  . Scrotal wall abscess 05/01/2013  . Cellulitis 05/01/2013  . Hematuria 04/30/2013  . Cellulitis of scrotum 04/29/2013  . Tick bite 04/29/2013  . Hypertension 12/30/2011    Past Surgical History:  Procedure Laterality Date  . APPENDECTOMY    . CERVICAL FUSION    . COLONOSCOPY    . CYSTOSCOPY N/A 05/01/2013   Procedure: CYSTOSCOPY;  Surgeon: Marissa Nestle, MD;  Location: [facility];  Service: Urology;  Laterality: N/A;  . EVALUATION UNDER ANESTHESIA WITH FISTULECTOMY N/A 06/23/2015   Procedure: ANAL EXAM UNDER ANESTHESIA  FISTULOTOMY;  Surgeon: Leighton Ruff, MD;  Location: [facility];  Service: General;  Laterality: N/A;  . FACIAL COSMETIC SURGERY    . HERNIA REPAIR    . INCISION AND DRAINAGE ABSCESS Right 05/01/2013   Procedure: INCISION AND DRAINAGE RIGHT SCROTAL ABSCESS;  Surgeon: Marissa Nestle, MD;  Location: [facility];  Service: Urology;  Laterality: Right;  . INCISION AND DRAINAGE PERIRECTAL ABSCESS    . KNEE ARTHROSCOPY    . SPINE SURGERY  cervical  . VIDEO ASSISTED THORACOSCOPY (VATS)/ LOBECTOMY Left 05/03/2015   Procedure: LEFT VIDEO ASSISTED THORACOSCOPY (VATS)/ LEFT LOWER LOBECTOMY, ON-Q CATHETER PLACEMENT;  Surgeon: Melrose Nakayama, MD;  Location: [facility];  Service: Thoracic;  Laterality: Left;  Marland Kitchen VIDEO BRONCHOSCOPY WITH ENDOBRONCHIAL ULTRASOUND N/A 04/22/2015   Procedure: VIDEO BRONCHOSCOPY WITH ENDOBRONCHIAL ULTRASOUND;  Surgeon: Melrose Nakayama, MD;  Location: [facility];  Service: Thoracic;  Laterality: N/A;       Home Medications    Prior to Admission medications   Medication Sig Start Date End Date Taking?  Authorizing Provider  albuterol (PROVENTIL HFA;VENTOLIN HFA) 108 (90 Base) MCG/ACT inhaler Inhale 2 puffs into the lungs every 6 (six) hours as needed for wheezing or shortness of breath. 03/12/15   Ripudeep Krystal Eaton, MD  ALPRAZolam Duanne Moron) 1 MG tablet Take 1 tablet (1 mg total) by mouth 3 (three) times daily as needed for anxiety. 03/12/15   Ripudeep Krystal Eaton, MD  diltiazem 2 % GEL Apply 1 application topically daily.    Historical Provider, MD  levothyroxine (SYNTHROID, LEVOTHROID) 25 MCG tablet Take 1 tablet (25 mcg total) by mouth daily before breakfast. Medication for your thyroid. 08/27/15   Rexene Alberts, MD  lisinopril-hydrochlorothiazide (PRINZIDE,ZESTORETIC) 20-12.5 MG tablet Take 1 tablet by mouth daily.    Historical Provider, MD  oxyCODONE-acetaminophen (PERCOCET/ROXICET) 5-325 MG tablet Take 1 tablet by mouth every 6 (six) hours as needed for moderate pain or severe pain.    Historical Provider, MD  potassium chloride 20 MEQ TBCR Take 30 mEq by mouth daily. TAKE 1 AND 1/2 TABLETS DAILY Patient taking differently: Take 20 mEq by mouth daily. TAKE 1 TABLET DAILY 08/27/15   Rexene Alberts, MD  promethazine (PHENERGAN) 25 MG tablet Take 25 mg by mouth every 6 (six) hours as needed for nausea or vomiting.    Historical Provider, MD  Psyllium (METAMUCIL FIBER PO) Take 1-2 capsules by mouth 2 (two) times daily. 2 capsules in the morning and 1 capsule in the evening.    Historical Provider, MD    Family History Family History  Problem Relation Age of Onset  . Breast cancer Mother   . Thyroid disease Mother     Social History Social History  Substance Use Topics  . Smoking status: Former Smoker    Packs/day: 1.50    Years: 15.00    Types: Cigarettes    Quit date: 03/05/2015  . Smokeless tobacco: Never Used  . Alcohol use No     Comment: occ     Allergies   Morphine and related   Review of Systems Review of Systems ROS reviewed and all are negative for acute change except as noted in  the HPI.  Physical Exam Updated Vital Signs BP (!) 136/93 (BP Location: Right Arm)   Pulse 80   Temp 97.7 F (36.5 C) (Oral)   Resp (!) 22   Ht 6\' 1"  (1.854 m)   Wt 117.9 kg   SpO2 98%   BMI 34.30 kg/m   Physical Exam  Constitutional: He is oriented to person, place, and time. Vital signs are normal. He appears well-developed and well-nourished.  HENT:  Head: Normocephalic and atraumatic.  Right Ear: Hearing normal.  Left Ear: Hearing normal.  Eyes: Conjunctivae and EOM are normal. Pupils are equal, round, and reactive to light.  Neck: Normal range of motion. Neck supple.  Cardiovascular: Normal rate, regular rhythm, normal heart sounds and intact distal pulses.   Pulmonary/Chest: Effort normal. No accessory muscle usage.  Tachypnea noted. No respiratory distress. He has decreased breath sounds in the left lower field. He has no wheezes.  Abdominal: Soft. There is no tenderness.  Musculoskeletal: Normal range of motion.  Neurological: He is alert and oriented to person, place, and time.  Skin: Skin is warm and dry.  Psychiatric: He has a normal mood and affect. His speech is normal and behavior is normal. Thought content normal.  Nursing note and vitals reviewed.  ED Treatments / Results  Labs (all labs ordered are listed, but only abnormal results are displayed) Labs Reviewed  CBC - Abnormal; Notable for the following:       Result Value   WBC 10.9 (*)    All other components within normal limits  BASIC METABOLIC PANEL - Abnormal; Notable for the following:    Potassium 3.3 (*)    Chloride 99 (*)    Glucose, Bld 101 (*)    All other components within normal limits  D-DIMER, QUANTITATIVE (NOT AT [facility])  I-STAT TROPOININ, ED  I-STAT TROPOININ, ED    EKG  EKG Interpretation  Date/Time:  Monday May 01 2016 09:19:29 EDT Ventricular Rate:  79 PR Interval:    QRS Duration: 92 QT Interval:  371 QTC Calculation: 426 R Axis:   -31 Text Interpretation:  Sinus rhythm  Left axis deviation Abnormal R-wave progression, early transition No significant change since last tracing Confirmed by [facility] MD, Corene Cornea [phone]) on 05/01/2016 9:32:11 AM       Radiology Ct Angio Chest Pe W And/or Wo Contrast  Result Date: 05/01/2016 CLINICAL DATA:  Right chest pain since this morning. History of left lobectomy for benign mass. EXAM: CT ANGIOGRAPHY CHEST WITH CONTRAST TECHNIQUE: Multidetector CT imaging of the chest was performed using the standard protocol during bolus administration of intravenous contrast. Multiplanar CT image reconstructions and MIPs were obtained to evaluate the vascular anatomy. CONTRAST:  100 cc Isovue 370 IV COMPARISON:  06/08/2015 FINDINGS: Cardiovascular: No filling defects in the pulmonary arteries to suggest pulmonary emboli. Heart is normal size. Aorta is normal caliber. Mediastinum/Nodes: No mediastinal, hilar, or axillary adenopathy. Trachea and esophagus are unremarkable. Lungs/Pleura: Dependent atelectasis in the lower lobes bilaterally. No pleural effusions. Upper Abdomen: Imaging into the upper abdomen shows no acute findings. Musculoskeletal: Chest wall soft tissues are unremarkable. No acute bony abnormality. Review of the MIP images confirms the above findings. IMPRESSION: No evidence of pulmonary embolus. Dependent atelectasis in the lungs. Electronically Signed   By: Rolm Baptise M.D.   On: 05/01/2016 11:37    Procedures Procedures (including critical care time)  Medications Ordered in ED Medications  HYDROmorphone (DILAUDID) tablet 1 mg (1 mg Oral Given 05/01/16 0942)  HYDROmorphone (DILAUDID) injection 1 mg (1 mg Intravenous Given 05/01/16 1045)  iopamidol (ISOVUE-370) 76 % injection 100 mL (100 mLs Intravenous Contrast Given 05/01/16 1109)  ketorolac (TORADOL) 30 MG/ML injection 30 mg (30 mg Intravenous Given 05/01/16 1227)   Initial Impression / Assessment and Plan / ED Course  I have reviewed the triage vital signs and the nursing  notes.  Pertinent labs & imaging results that were available during my care of the patient were reviewed by me and considered in my medical decision making (see chart for details).  Final Clinical Impressions(s) / ED Diagnoses  {I have reviewed and evaluated the relevant laboratory values. {I have reviewed and evaluated the relevant imaging studies. {I have interpreted the relevant EKG. {I have reviewed the relevant previous healthcare records.  {I obtained HPI from historian. {  Patient discussed with supervising physician.  ED Course:  Assessment: Pt is a 56 y.o. male with hx HTN, OSA, Lobectomy LLL who presents with right sided pleuritic chest pain x 1 week. Productive sputum and cough as well. Pain is constant. No N/V. No diaphoresis. No hx ACS. No FH. Noted hx as truck driver. No hx DVT/PE. On exam, pt appears uncomofrtable. Nontoxic/nonseptic appearing. VSS. Afebrile. Lungs CTA with the exception of LLL due to lobectomy. Heart RRR. Abdomen nontender soft. CBC  With mild leukocytosis. BMP unremarkable. Trop negative. EKG without acute abnormality. Concern for PE. CT angio which was negative. Noted dependant atelectasis. Given analgesia in ED. Discussed with supervising physician. Heart Score 3. Seen by supervising physician. Delta Trop negative. Given Toradol with relief of symptoms. Likely musculoskeletal in etiology. Plan is to DC home with follow up to PCP. At time of discharge, Patient is in no acute distress. Vital Signs are stable. Patient is able to ambulate. Patient able to tolerate PO.   Disposition/Plan:  DC Home Additional Verbal discharge instructions given and discussed with patient.  Pt Instructed to f/u with PCP in the next week for evaluation and treatment of symptoms. Return precautions given Pt acknowledges and agrees with plan  Supervising Physician Merrily Pew, MD  Final diagnoses:  Chest pain, unspecified type    New Prescriptions New Prescriptions   No medications  on file     Shary Decamp, PA-C 05/01/16 [place], MD 05/01/16 [place], MD 05/01/16 1409

## 2016-05-01 NOTE — ED Provider Notes (Signed)
Medical screening examination/treatment/procedure(s) were conducted as a shared visit with non-physician practitioner(s) and myself.  I personally evaluated the patient during the encounter.  56 you here with right sided chest pain.  On my exam (after toradol helped with symptoms 'much better than that dilaudid') and was pain free. No tachypnea. HR RRR no m/r/g. No rashes/e/o trauma.  Workup negative. No PE, troponin/ECG ok without evidence of ischemic changes.  Plan for dc with outpatient follow up.    EKG Interpretation  Date/Time:  Monday May 01 2016 09:19:29 EDT Ventricular Rate:  79 PR Interval:    QRS Duration: 92 QT Interval:  371 QTC Calculation: 426 R Axis:   -31 Text Interpretation:  Sinus rhythm Left axis deviation Abnormal R-wave progression, early transition No significant change since last tracing Confirmed by [facility] MD, Corene Cornea [phone]) on 05/01/2016 9:32:11 AM         Merrily Pew, MD 05/01/16 1510

## 2016-05-01 NOTE — ED Triage Notes (Signed)
PT states new cough in the past week that productive white sputum and states partial lung removal on left side last year. 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

## 2016-05-01 NOTE — Discharge Instructions (Addendum)
Please read and follow all provided instructions.  Your diagnoses today include:  1. Chest pain, unspecified type   2. Chest wall pain     Tests performed today include: An EKG of your heart A chest x-ray Cardiac enzymes - a blood test for heart muscle damage Blood counts and electrolytes Vital signs. See below for your results today.   Medications prescribed:   Take any prescribed medications only as directed.  Follow-up instructions: Please follow-up with your primary care provider as soon as you can for further evaluation of your symptoms.   Return instructions:  SEEK IMMEDIATE MEDICAL ATTENTION IF: You have severe chest pain, especially if the pain is crushing or pressure-like and spreads to the arms, back, neck, or jaw, or if you have sweating, nausea (feeling sick to your stomach), or shortness of breath. THIS IS AN EMERGENCY. Don't wait to see if the pain will go away. Get medical help at once. Call 911 or 0 (operator). DO NOT drive yourself to the hospital.  Your chest pain gets worse and does not go away with rest.  You have an attack of chest pain lasting longer than usual, despite rest and treatment with the medications your caregiver has prescribed.  You wake from sleep with chest pain or shortness of breath. You feel dizzy or faint. You have chest pain not typical of your usual pain for which you originally saw your caregiver.  You have any other emergent concerns regarding your health.  Additional Information: Chest pain comes from many different causes. Your caregiver has diagnosed you as having chest pain that is not specific for one problem, but does not require admission.  You are at low risk for an acute heart condition or other serious illness.   Your vital signs today were: BP (!) 136/105    Pulse 72    Temp 97.7 F (36.5 C) (Oral)    Resp (!) 25    Ht 6\' 1"  (1.854 m)    Wt 117.9 kg    SpO2 (!) 89%    BMI 34.30 kg/m  If your blood pressure (BP) was elevated  above 135/85 this visit, please have this repeated by your doctor within one month. --------------

## 2016-10-11 ENCOUNTER — Emergency Department (HOSPITAL_COMMUNITY): Payer: Medicaid Other

## 2016-10-11 ENCOUNTER — Encounter (HOSPITAL_COMMUNITY): Payer: Self-pay | Admitting: *Deleted

## 2016-10-11 ENCOUNTER — Observation Stay (HOSPITAL_COMMUNITY)
Admission: EM | Admit: 2016-10-11 | Discharge: 2016-10-12 | Disposition: A | Discharge status: Home / Self Care | Payer: Medicaid Other | Attending: Internal Medicine | Admitting: Internal Medicine

## 2016-10-11 DIAGNOSIS — Z79899 Other long term (current) drug therapy: Secondary | ICD-10-CM | POA: Insufficient documentation

## 2016-10-11 DIAGNOSIS — J449 Chronic obstructive pulmonary disease, unspecified: Secondary | ICD-10-CM | POA: Insufficient documentation

## 2016-10-11 DIAGNOSIS — E876 Hypokalemia: Principal | ICD-10-CM | POA: Insufficient documentation

## 2016-10-11 DIAGNOSIS — R0789 Other chest pain: Secondary | ICD-10-CM | POA: Diagnosis not present

## 2016-10-11 DIAGNOSIS — E039 Hypothyroidism, unspecified: Secondary | ICD-10-CM | POA: Insufficient documentation

## 2016-10-11 DIAGNOSIS — I10 Essential (primary) hypertension: Secondary | ICD-10-CM | POA: Insufficient documentation

## 2016-10-11 DIAGNOSIS — Z87891 Personal history of nicotine dependence: Secondary | ICD-10-CM | POA: Insufficient documentation

## 2016-10-11 DIAGNOSIS — R079 Chest pain, unspecified: Secondary | ICD-10-CM | POA: Diagnosis present

## 2016-10-11 DIAGNOSIS — R071 Chest pain on breathing: Secondary | ICD-10-CM

## 2016-10-11 DIAGNOSIS — G4733 Obstructive sleep apnea (adult) (pediatric): Secondary | ICD-10-CM | POA: Diagnosis present

## 2016-10-11 LAB — COMPREHENSIVE METABOLIC PANEL
ALT: 48 U/L (ref 17–63)
AST: 41 U/L (ref 15–41)
Albumin: 4.3 g/dL (ref 3.5–5.0)
Alkaline Phosphatase: 95 U/L (ref 38–126)
Anion gap: 12 (ref 5–15)
BILIRUBIN TOTAL: 0.6 mg/dL (ref 0.3–1.2)
BUN: 11 mg/dL (ref 6–20)
CHLORIDE: 95 mmol/L — AB (ref 101–111)
CO2: 27 mmol/L (ref 22–32)
CREATININE: 1.37 mg/dL — AB (ref 0.61–1.24)
Calcium: 8.9 mg/dL (ref 8.9–10.3)
GFR, EST NON AFRICAN AMERICAN: 57 mL/min — AB (ref >60)
Glucose, Bld: 163 mg/dL — ABNORMAL HIGH (ref 65–99)
Potassium: 2.6 mmol/L — CL (ref 3.5–5.1)
Sodium: 134 mmol/L — ABNORMAL LOW (ref 135–145)
TOTAL PROTEIN: 8.4 g/dL — AB (ref 6.5–8.1)

## 2016-10-11 LAB — MAGNESIUM: Magnesium: 1.9 mg/dL (ref 1.7–2.4)

## 2016-10-11 LAB — CBC WITH DIFFERENTIAL/PLATELET
Basophils Absolute: 0 10*3/uL (ref 0.0–0.1)
Basophils Relative: 0 %
Eosinophils Absolute: 0.5 10*3/uL (ref 0.0–0.7)
Eosinophils Relative: 7 %
HEMATOCRIT: 43.1 % (ref 39.0–52.0)
Hemoglobin: 15.2 g/dL (ref 13.0–17.0)
LYMPHS ABS: 1.6 10*3/uL (ref 0.7–4.0)
LYMPHS PCT: 21 %
MCH: 33.8 pg (ref 26.0–34.0)
MCHC: 35.3 g/dL (ref 30.0–36.0)
MCV: 95.8 fL (ref 78.0–100.0)
MONO ABS: 0.4 10*3/uL (ref 0.1–1.0)
Monocytes Relative: 5 %
NEUTROS ABS: 5.2 10*3/uL (ref 1.7–7.7)
Neutrophils Relative %: 67 %
PLATELETS: 235 10*3/uL (ref 150–400)
RBC: 4.5 MIL/uL (ref 4.22–5.81)
RDW: 13.7 % (ref 11.5–15.5)
WBC: 7.7 10*3/uL (ref 4.0–10.5)

## 2016-10-11 LAB — TROPONIN I

## 2016-10-11 MED ORDER — SODIUM CHLORIDE 0.9 % IV SOLN
INTRAVENOUS | Status: DC
  Administered 2016-10-11: 23:00:00 via INTRAVENOUS

## 2016-10-11 MED ORDER — SODIUM CHLORIDE 0.9 % IV BOLUS (SEPSIS)
1000.0000 mL | Freq: Once | INTRAVENOUS | Status: AC
  Administered 2016-10-11: 1000 mL via INTRAVENOUS

## 2016-10-11 MED ORDER — KETOROLAC TROMETHAMINE 15 MG/ML IJ SOLN
15.0000 mg | Freq: Four times a day (QID) | INTRAMUSCULAR | Status: DC | PRN
  Administered 2016-10-11: 15 mg via INTRAVENOUS
  Filled 2016-10-11: qty 1

## 2016-10-11 MED ORDER — ACETAMINOPHEN 325 MG PO TABS: 650.0000 mg | ORAL_TABLET | Freq: Four times a day (QID) | ORAL | Status: DC | PRN

## 2016-10-11 MED ORDER — ONDANSETRON HCL 4 MG/2ML IJ SOLN: 4.0000 mg | Freq: Four times a day (QID) | INTRAMUSCULAR | Status: DC | PRN

## 2016-10-11 MED ORDER — SODIUM CHLORIDE 0.9 % IV BOLUS (SEPSIS): 1000.0000 mL | Freq: Once | INTRAVENOUS | Status: DC

## 2016-10-11 MED ORDER — POTASSIUM CHLORIDE 10 MEQ/100ML IV SOLN
10.0000 meq | INTRAVENOUS | Status: AC
  Administered 2016-10-11 (×6): 10 meq via INTRAVENOUS
  Filled 2016-10-11 (×6): qty 100

## 2016-10-11 MED ORDER — LEVOTHYROXINE SODIUM 50 MCG PO TABS
25.0000 ug | ORAL_TABLET | Freq: Every day | ORAL | Status: DC
  Administered 2016-10-12: 25 ug via ORAL
  Filled 2016-10-11: qty 1

## 2016-10-11 MED ORDER — HYDROMORPHONE HCL 1 MG/ML IJ SOLN
1.0000 mg | Freq: Once | INTRAMUSCULAR | Status: AC
  Administered 2016-10-11: 1 mg via INTRAVENOUS
  Filled 2016-10-11: qty 1

## 2016-10-11 MED ORDER — ACETAMINOPHEN 650 MG RE SUPP: 650.0000 mg | Freq: Four times a day (QID) | RECTAL | Status: DC | PRN

## 2016-10-11 MED ORDER — POTASSIUM CHLORIDE CRYS ER 20 MEQ PO TBCR
40.0000 meq | EXTENDED_RELEASE_TABLET | Freq: Once | ORAL | Status: AC
  Administered 2016-10-11: 40 meq via ORAL
  Filled 2016-10-11: qty 2

## 2016-10-11 MED ORDER — ONDANSETRON HCL 4 MG PO TABS: 4.0000 mg | ORAL_TABLET | Freq: Four times a day (QID) | ORAL | Status: DC | PRN

## 2016-10-11 MED ORDER — ALBUTEROL SULFATE (2.5 MG/3ML) 0.083% IN NEBU: 3.0000 mL | INHALATION_SOLUTION | Freq: Four times a day (QID) | RESPIRATORY_TRACT | Status: DC | PRN

## 2016-10-11 MED ORDER — POTASSIUM CHLORIDE 10 MEQ/100ML IV SOLN
INTRAVENOUS | Status: AC
  Administered 2016-10-11: 10 meq via INTRAVENOUS
  Filled 2016-10-11: qty 100

## 2016-10-11 MED ORDER — SODIUM CHLORIDE 0.9 % IV BOLUS (SEPSIS)
500.0000 mL | Freq: Once | INTRAVENOUS | Status: AC
  Administered 2016-10-11: 500 mL via INTRAVENOUS

## 2016-10-11 MED ORDER — IOPAMIDOL (ISOVUE-370) INJECTION 76%
100.0000 mL | Freq: Once | INTRAVENOUS | Status: AC | PRN
  Administered 2016-10-11: 100 mL via INTRAVENOUS

## 2016-10-11 MED ORDER — ONDANSETRON HCL 4 MG/2ML IJ SOLN
4.0000 mg | Freq: Once | INTRAMUSCULAR | Status: AC
  Administered 2016-10-11: 4 mg via INTRAVENOUS
  Filled 2016-10-11: qty 2

## 2016-10-11 MED ORDER — POTASSIUM CHLORIDE 10 MEQ/100ML IV SOLN
10.0000 meq | Freq: Once | INTRAVENOUS | Status: AC
  Administered 2016-10-11: 10 meq via INTRAVENOUS
  Filled 2016-10-11: qty 100

## 2016-10-11 MED ORDER — POTASSIUM CHLORIDE 10 MEQ/100ML IV SOLN
10.0000 meq | Freq: Once | INTRAVENOUS | Status: AC
  Administered 2016-10-11: 10 meq via INTRAVENOUS

## 2016-10-11 MED ORDER — TRAZODONE HCL 50 MG PO TABS: 25.0000 mg | ORAL_TABLET | Freq: Every evening | ORAL | Status: DC | PRN

## 2016-10-11 MED ORDER — ALPRAZOLAM 0.5 MG PO TABS
1.0000 mg | ORAL_TABLET | Freq: Three times a day (TID) | ORAL | Status: DC | PRN
  Administered 2016-10-11 – 2016-10-12 (×2): 1 mg via ORAL
  Filled 2016-10-11 (×2): qty 2

## 2016-10-11 NOTE — H&P (Addendum)
History and Physical:    DASANI CREAR   ZOX:[id number] DOB: 03/28/1960 DOA: 10/11/2016  Referring MD/provider: Dr Rogene Houston PCP: Redmond School, MD   Patient coming from: Home  Chief Complaint: Wall pain since last night  History of Present Illness:   NAPOLEON MONACELLI is an 56 y.o. 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 Over the course of the evening patient noted increasing in his chest wall pain especially as he turned the steering wheel. Patient noted the pain got so bad that every time he moved forward or backwards or moved his left arm his chest pain got worse. Patient presented to the ED for this.   ED Course:  The patient was worked up with a CT angiogram which was negative for pulmonary embolus. His chest x-ray revealed no new infection. EKG was without abnormalities. Of note his potassium was noted to be low at 2.6.  ROS:   ROS patient denies any chest wall trauma, denies being in a fight or hitting himself. Patient denies fevers or chills or malaise. Patient denies cough or hemoptysis. Patient denies any night sweats or weight loss.  Review of systems is positive for diarrhea for the past several months. Patient states he has watery diarrhea 3 times a day since treatment for an anal fistula. He notes he had been on probiotics which were somewhat helpful at the time but he is no longer on them. Patient denies any abdominal pain, nausea vomiting or hematemesis.  Past Medical History:   Past Medical History:  Diagnosis Date  . Acute respiratory failure with hypoxia (HCC) 08/27/2015   Mild-Oxygen saturation 88% with ambulation.  . Allergy   . Anal fissure   . 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 Complication of  anesthesia    woke up during colonoscopy  . HCAP (healthcare-associated pneumonia) 05/12/2015  . Hypertension 12/30/2011  . Hypothyroidism 08/27/2015   New diagnosis  . Loose stools   . Lung mass    hx of left lower benign mass  . Obstructive sleep apnea 08/27/2015   Per history only; not confirmed with a sleep study yet.  Marland Kitchen Peri-rectal abscess    multiple.   . Pneumonia   . Shortness of breath dyspnea    with exertion  . 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 Tobacco abuse   . Umbilical hernia    has been repaired    Past Surgical History:   Past Surgical History:  Procedure Laterality Date  . APPENDECTOMY    . CERVICAL FUSION    . COLONOSCOPY    . CYSTOSCOPY N/A 05/01/2013   Procedure: CYSTOSCOPY;  Surgeon: Marissa Nestle, MD;  Location: [facility];  Service: Urology;  Laterality: N/A;  . EVALUATION UNDER ANESTHESIA WITH FISTULECTOMY N/A 06/23/2015   Procedure: ANAL EXAM UNDER ANESTHESIA  FISTULOTOMY;  Surgeon: Leighton Ruff, MD;  Location: [place];  Service: General;  Laterality: N/A;  . FACIAL COSMETIC SURGERY    . HERNIA REPAIR    . INCISION AND DRAINAGE ABSCESS Right 05/01/2013   Procedure: INCISION AND DRAINAGE RIGHT SCROTAL ABSCESS;  Surgeon: Marissa Nestle, MD;  Location: [facility];  Service: Urology;  Laterality: Right;  .  INCISION AND DRAINAGE PERIRECTAL ABSCESS    . KNEE ARTHROSCOPY    . SPINE SURGERY     cervical  . VIDEO ASSISTED THORACOSCOPY (VATS)/ LOBECTOMY Left 05/03/2015   Procedure: LEFT VIDEO ASSISTED THORACOSCOPY (VATS)/ LEFT LOWER LOBECTOMY, ON-Q CATHETER PLACEMENT;  Surgeon: Melrose Nakayama, MD;  Location: [place];  Service: Thoracic;  Laterality: Left;  Marland Kitchen VIDEO BRONCHOSCOPY WITH ENDOBRONCHIAL ULTRASOUND N/A 04/22/2015   Procedure: VIDEO BRONCHOSCOPY WITH ENDOBRONCHIAL ULTRASOUND;  Surgeon: Melrose Nakayama, MD;  Location: [place];  Service: Thoracic;  Laterality: N/A;    Social History:   Social History   Social  History  . Marital status: Divorced    Spouse name: N/A  . Number of children: 2  . Years of education: 10   Occupational History  . N/A    Social History Main Topics  . Smoking status: Former Smoker    Packs/day: 1.50    Years: 15.00    Types: Cigarettes    Quit date: 03/05/2015  . Smokeless tobacco: Never Used  . Alcohol use No     Comment: occ  . Drug use: No  . Sexual activity: No   Other Topics Concern  . Not on file   Social History Narrative   Rare caffeine use     Allergies   Morphine and related  Family history:   Family History  Problem Relation Age of Onset  . Breast cancer Mother   . Thyroid disease Mother     Current Medications:   Prior to Admission medications   Medication Sig Start Date End Date Taking? Authorizing Provider  albuterol (PROVENTIL HFA;VENTOLIN HFA) 108 (90 Base) MCG/ACT inhaler Inhale 2 puffs into the lungs every 6 (six) hours as needed for wheezing or shortness of breath. 03/12/15  Yes Rai, Ripudeep K, MD  ALPRAZolam (XANAX) 1 MG tablet Take 1 tablet (1 mg total) by mouth 3 (three) times daily as needed for anxiety. 03/12/15  Yes Rai, Ripudeep K, MD  levothyroxine (SYNTHROID, LEVOTHROID) 25 MCG tablet Take 1 tablet (25 mcg total) by mouth daily before breakfast. Medication for your thyroid. 08/27/15  Yes Rexene Alberts, MD  lisinopril-hydrochlorothiazide (PRINZIDE,ZESTORETIC) 20-12.5 MG tablet Take 1 tablet by mouth daily.   Yes [provider]    Physical Exam:   Vitals:   10/11/16 1845 10/11/16 1900 10/11/16 1906 10/11/16 1940  BP:  94/67 109/84 107/74  Pulse: 81 83 81 75  Resp: (!) 21 (!) 21 15 18   Temp:      TempSrc:      SpO2: 94% 92% 94% 96%  Weight:      Height:         Physical Exam: Blood pressure 107/74, pulse 75, temperature 97.6 F (36.4 C), temperature source Oral, resp. rate 18, height 6\' 2"  (1.88 m), weight 117.9 kg (260 lb), SpO2 96 %. Gen: Somewhat ruddy complected anxious appearing man lying in  bed with his mother at bedside. Eyes: Sclerae anicteric. Conjunctiva mildly injected. Neck: Supple, no jugular venous distention. Chest: Patient has clear tenderness to palpation in the left 5th-6th intercostal area. Chest pain is reproducible with patient trying to sit up in bed or any movement requiring his Pecs. Decreased breath sounds on left with some bronchial breath sounds in lower lung. Right lung with good air entry bilaterally. CV: Distant, regular, no audible murmurs. Abdomen: NABS, soft, nondistended, nontender. No tenderness to light or deep palpation. No rebound, no guarding. Extremities: No edema.  Skin: Warm and dry. No rashes,  lesions or wounds. No ecchymoses noted on chest wall. Neuro: Alert and oriented times 3; grossly nonfocal. Psych: Patient is cooperative, logical and coherent with appropriate mood and affect.  Data Review:    Labs: Basic Metabolic Panel:  Recent Labs Lab 10/11/16 1456  NA 134*  K 2.6*  CL 95*  CO2 27  GLUCOSE 163*  BUN 11  CREATININE 1.37*  CALCIUM 8.9   Liver Function Tests:  Recent Labs Lab 10/11/16 1456  AST 41  ALT 48  ALKPHOS 95  BILITOT 0.6  PROT 8.4*  ALBUMIN 4.3   No results for input(s): LIPASE, AMYLASE in the last 168 hours. No results for input(s): AMMONIA in the last 168 hours. CBC:  Recent Labs Lab 10/11/16 1456  WBC 7.7  NEUTROABS 5.2  HGB 15.2  HCT 43.1  MCV 95.8  PLT 235   Cardiac Enzymes:  Recent Labs Lab 10/11/16 1456  TROPONINI <0.03    BNP (last 3 results) No results for input(s): PROBNP in the last 8760 hours. CBG: No results for input(s): GLUCAP in the last 168 hours.  Urinalysis    Component Value Date/Time   COLORURINE YELLOW 08/26/2015 1243   APPEARANCEUR CLEAR 08/26/2015 1243   LABSPEC 1.020 08/26/2015 1243   PHURINE 6.0 08/26/2015 1243   GLUCOSEU NEGATIVE 08/26/2015 1243   HGBUR NEGATIVE 08/26/2015 1243   BILIRUBINUR NEGATIVE 08/26/2015 1243   KETONESUR NEGATIVE 08/26/2015  1243   PROTEINUR NEGATIVE 08/26/2015 1243   UROBILINOGEN 0.2 08/15/2009 0510   NITRITE NEGATIVE 08/26/2015 1243   LEUKOCYTESUR NEGATIVE 08/26/2015 1243      Radiographic Studies: Dg Chest 2 View  Result Date: 10/11/2016 CLINICAL DATA:  Left-sided chest pain and shortness of breath for several hours EXAM: CHEST  2 VIEW COMPARISON:  05/01/2016 FINDINGS: The heart size and mediastinal contours are within normal limits. Both lungs are clear. The visualized skeletal structures are unremarkable. IMPRESSION: No active cardiopulmonary disease. Electronically Signed   By: Inez Catalina M.D.   On: 10/11/2016 15:37   Ct Angio Chest Pe W/cm &/or Wo Cm  Result Date: 10/11/2016 CLINICAL DATA:  Chest pain this morning EXAM: CT ANGIOGRAPHY CHEST WITH CONTRAST TECHNIQUE: Multidetector CT imaging of the chest was performed using the standard protocol during bolus administration of intravenous contrast. Multiplanar CT image reconstructions and MIPs were obtained to evaluate the vascular anatomy. CONTRAST:  100 cc Isovue 370 COMPARISON:  05/01/2016 FINDINGS: Cardiovascular: There are no filling defects in the pulmonary arterial tree to suggest acute pulmonary thromboembolism. There is no evidence of aortic dissection or aneurysm. Mild atherosclerotic calcifications of the aortic arch. Minimal LAD territory coronary artery calcification. Mediastinum/Nodes: No abnormal mediastinal adenopathy. No pericardial effusion. Lungs/Pleura: No pneumothorax. No pleural effusion. Dependent atelectasis. Mild emphysema at the lung apices. Upper Abdomen: No acute abnormality. Musculoskeletal: No vertebral compression deformity or acute rib fracture. Review of the MIP images confirms the above findings. IMPRESSION: No evidence of acute pulmonary thromboembolism. Aortic Atherosclerosis (ICD10-I70.0) and Emphysema (ICD10-J43.9). Electronically Signed   By: Marybelle Killings M.D.   On: 10/11/2016 18:37    EKG: Independently reviewed. Sinus  rhythm at 90. Q in 3 and F. + LAD + LAHB   Assessment/Plan:   Principal Problem:   Hypokalemia Active Problems:   Chest pain   Essential hypertension   Obstructive sleep apnea  HYPOKALEMIA Hypokalemia is most likely secondary to chronic diarrhea times several months. Will replete and recheck in the morning. Will order magnesium tonight. Patient may or may not benefit from being  discharged on oral potassium with follow-up with PCP for recheck.  CHEST WALL PAIN Appears to be secondary to musculoskeletal strain. He has tenderness to palpation and chest pain with any movement of his pectoralis. Will treat with NSAIDs and Tylenol as needed. He will also benefit from rest and icing. He may need to take some time off of work.  HYPONATREMIA Likely secondary to intravascular volume depletion. Hydrate with IV fluids overnight and recheck in the morning.  HTN Hold his HCTZ lisinopril overnight while we are fluid resuscitating him.  OSA  Patient does not use CPAP at home. He may need a repeat sleep study as an outpatient.     Other information:   DVT prophylaxis: Lovenox ordered. Code Status: Full code. Family Communication: Patient's mother was at bedside throughout the encounter.  Disposition Plan: Home Consults called: None Admission status: Observation   The medical decision making is of moderate complexity, therefore this is a level 2 visit.  Dewaine Oats Derek Jack [facility] Pager [phone] Cell: [phone]   If 7PM-7AM, please contact night-coverage [email or web address] Password [email or web address] 10/11/2016, 8:57 PM

## 2016-10-11 NOTE — ED Triage Notes (Signed)
Pt c/o mid to left sided chest pain that started this morning and has progressively gotten worse. Pt also c/o SOB.

## 2016-10-11 NOTE — ED Provider Notes (Signed)
[facility] DEPT Provider Note   CSN: [id number] Arrival date & time: 10/11/16  1433     History   Chief Complaint Chief Complaint  Patient presents with  . Chest Pain    HPI Fernando [name] is a 56 y.o. male.  The patient works as a Administrator. Had acute onset of left-sided chest pain at 1 in the morning. Worse with deep breaths worse with movements of his arms. Patient status post lung resection partial on the left side in April 2017. No known history of coronary artery disease. Not on blood thinners. Pain is been constant since its onset. And as stated made worse with movement. Pain is severe 10 out of 10.      Past Medical History:  Diagnosis Date  . Acute respiratory failure with hypoxia (HCC) 08/27/2015   Mild-Oxygen saturation 88% with ambulation.  . Allergy   . Anal fissure   . 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 Complication of anesthesia    woke up during colonoscopy  . HCAP (healthcare-associated pneumonia) 05/12/2015  . Hypertension 12/30/2011  . Hypothyroidism 08/27/2015   New diagnosis  . Loose stools   . Lung mass    hx of left lower benign mass  . Obstructive sleep apnea 08/27/2015   Per history only; not confirmed with a sleep study yet.  Marland Kitchen Peri-rectal abscess    multiple.   . Pneumonia   . Shortness of breath dyspnea    with exertion  . 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 Tobacco abuse   . Umbilical hernia    has been repaired    Patient Active Problem List   Diagnosis Date Noted  . Hypothyroidism 08/27/2015  . Acute respiratory failure with hypoxia ([place]) 08/27/2015  . COPD (chronic obstructive pulmonary disease) ([place]) 08/27/2015  . Obstructive sleep apnea 08/27/2015  . Dyspnea on exertion 08/26/2015  . Left sided chest pain 08/26/2015  . Obesity 08/26/2015  . Anal fistula 08/26/2015  . Pedal edema 08/26/2015  . Hypokalemia 08/26/2015  . Chronic diarrhea 08/26/2015  . Essential hypertension 08/26/2015  . Pain in the chest   . Chest  pain 05/12/2015  . HCAP (healthcare-associated pneumonia) 05/12/2015  . Nodule of left lung 05/03/2015  . Lung mass 03/30/2015  . Peri-rectal abscess 03/07/2015  . Abnormal CXR 03/07/2015  . Scrotal wall abscess 05/01/2013  . Cellulitis 05/01/2013  . Hematuria 04/30/2013  . Cellulitis of scrotum 04/29/2013  . Tick bite 04/29/2013  . Hypertension 12/30/2011    Past Surgical History:  Procedure Laterality Date  . APPENDECTOMY    . CERVICAL FUSION    . COLONOSCOPY    . CYSTOSCOPY N/A 05/01/2013   Procedure: CYSTOSCOPY;  Surgeon: Marissa Nestle, MD;  Location: [facility];  Service: Urology;  Laterality: N/A;  . EVALUATION UNDER ANESTHESIA WITH FISTULECTOMY N/A 06/23/2015   Procedure: ANAL EXAM UNDER ANESTHESIA  FISTULOTOMY;  Surgeon: Leighton Ruff, MD;  Location: [place];  Service: General;  Laterality: N/A;  . FACIAL COSMETIC SURGERY    . HERNIA REPAIR    . INCISION AND DRAINAGE ABSCESS Right 05/01/2013   Procedure: INCISION AND DRAINAGE RIGHT SCROTAL ABSCESS;  Surgeon: Marissa Nestle, MD;  Location: [facility];  Service: Urology;  Laterality: Right;  . INCISION AND DRAINAGE PERIRECTAL ABSCESS    . KNEE ARTHROSCOPY    . SPINE SURGERY     cervical  . VIDEO ASSISTED THORACOSCOPY (VATS)/ LOBECTOMY Left 05/03/2015   Procedure: LEFT VIDEO ASSISTED  THORACOSCOPY (VATS)/ LEFT LOWER LOBECTOMY, ON-Q CATHETER PLACEMENT;  Surgeon: Melrose Nakayama, MD;  Location: [place];  Service: Thoracic;  Laterality: Left;  Marland Kitchen VIDEO BRONCHOSCOPY WITH ENDOBRONCHIAL ULTRASOUND N/A 04/22/2015   Procedure: VIDEO BRONCHOSCOPY WITH ENDOBRONCHIAL ULTRASOUND;  Surgeon: Melrose Nakayama, MD;  Location: [place];  Service: Thoracic;  Laterality: N/A;       Home Medications    Prior to Admission medications   Medication Sig Start Date End Date Taking? Authorizing Provider  albuterol (PROVENTIL HFA;VENTOLIN HFA) 108 (90 Base) MCG/ACT inhaler Inhale 2 puffs into the lungs every 6 (six) hours as  needed for wheezing or shortness of breath. 03/12/15  Yes Rai, Ripudeep K, MD  ALPRAZolam (XANAX) 1 MG tablet Take 1 tablet (1 mg total) by mouth 3 (three) times daily as needed for anxiety. 03/12/15  Yes Rai, Ripudeep K, MD  levothyroxine (SYNTHROID, LEVOTHROID) 25 MCG tablet Take 1 tablet (25 mcg total) by mouth daily before breakfast. Medication for your thyroid. 08/27/15  Yes Rexene Alberts, MD  lisinopril-hydrochlorothiazide (PRINZIDE,ZESTORETIC) 20-12.5 MG tablet Take 1 tablet by mouth daily.   Yes [provider]    Family History Family History  Problem Relation Age of Onset  . Breast cancer Mother   . Thyroid disease Mother     Social History Social History  Substance Use Topics  . Smoking status: Former Smoker    Packs/day: 1.50    Years: 15.00    Types: Cigarettes    Quit date: 03/05/2015  . Smokeless tobacco: Never Used  . Alcohol use No     Comment: occ     Allergies   Morphine and related   Review of Systems Review of Systems  Constitutional: Negative for fever.  HENT: Negative for congestion.   Eyes: Negative for visual disturbance.  Respiratory: Negative for shortness of breath.   Cardiovascular: Positive for chest pain. Negative for leg swelling.  Gastrointestinal: Negative for abdominal pain, nausea and vomiting.  Genitourinary: Negative for dysuria.  Musculoskeletal: Negative for back pain.  Skin: Negative for rash.  Neurological: Negative for headaches.  Hematological: Does not bruise/bleed easily.  Psychiatric/Behavioral: Negative for confusion.     Physical Exam Updated Vital Signs BP 95/66   Pulse 73   Temp 97.6 F (36.4 C) (Oral)   Resp 19   Ht 1.88 m (6\' 2" )   Wt 117.9 kg (260 lb)   SpO2 99%   BMI 33.38 kg/m   Physical Exam  Constitutional: He is oriented to person, place, and time. He appears well-developed and well-nourished. No distress.  HENT:  Head: Normocephalic and atraumatic.  Mouth/Throat: Oropharynx is clear and  moist.  Eyes: Pupils are equal, round, and reactive to light. Conjunctivae and EOM are normal.  Neck: Normal range of motion. Neck supple.  Cardiovascular: Normal rate and regular rhythm.   Pulmonary/Chest: Effort normal and breath sounds normal. He exhibits tenderness.  Abdominal: Soft. Bowel sounds are normal.  Musculoskeletal: Normal range of motion.  Neurological: He is alert and oriented to person, place, and time. No cranial nerve deficit or sensory deficit. He exhibits normal muscle tone. Coordination normal.  Skin: Skin is warm.  Nursing note and vitals reviewed.    ED Treatments / Results  Labs (all labs ordered are listed, but only abnormal results are displayed) Labs Reviewed  COMPREHENSIVE METABOLIC PANEL - Abnormal; Notable for the following:       Result Value   Sodium 134 (*)    Potassium 2.6 (*)  Chloride 95 (*)    Glucose, Bld 163 (*)    Creatinine, Ser 1.37 (*)    Total Protein 8.4 (*)    GFR calc non Af Amer 57 (*)    All other components within normal limits  CBC WITH DIFFERENTIAL/PLATELET  TROPONIN I    EKG  EKG Interpretation  Date/Time:  Wednesday October 11 2016 14:39:59 EDT Ventricular Rate:  89 PR Interval:  154 QRS Duration: 94 QT Interval:  374 QTC Calculation: 455 R Axis:   -57 Text Interpretation:  Normal sinus rhythm Left anterior fascicular block Inferior infarct , age undetermined Cannot rule out Anterior infarct , age undetermined Abnormal ECG No significant change since last tracing Confirmed by Orlie Dakin [phone]) on 10/11/2016 2:48:41 PM       Radiology Dg Chest 2 View  Result Date: 10/11/2016 CLINICAL DATA:  Left-sided chest pain and shortness of breath for several hours EXAM: CHEST  2 VIEW COMPARISON:  05/01/2016 FINDINGS: The heart size and mediastinal contours are within normal limits. Both lungs are clear. The visualized skeletal structures are unremarkable. IMPRESSION: No active cardiopulmonary disease. Electronically  Signed   By: Inez Catalina M.D.   On: 10/11/2016 15:37   Ct Angio Chest Pe W/cm &/or Wo Cm  Result Date: 10/11/2016 CLINICAL DATA:  Chest pain this morning EXAM: CT ANGIOGRAPHY CHEST WITH CONTRAST TECHNIQUE: Multidetector CT imaging of the chest was performed using the standard protocol during bolus administration of intravenous contrast. Multiplanar CT image reconstructions and MIPs were obtained to evaluate the vascular anatomy. CONTRAST:  100 cc Isovue 370 COMPARISON:  05/01/2016 FINDINGS: Cardiovascular: There are no filling defects in the pulmonary arterial tree to suggest acute pulmonary thromboembolism. There is no evidence of aortic dissection or aneurysm. Mild atherosclerotic calcifications of the aortic arch. Minimal LAD territory coronary artery calcification. Mediastinum/Nodes: No abnormal mediastinal adenopathy. No pericardial effusion. Lungs/Pleura: No pneumothorax. No pleural effusion. Dependent atelectasis. Mild emphysema at the lung apices. Upper Abdomen: No acute abnormality. Musculoskeletal: No vertebral compression deformity or acute rib fracture. Review of the MIP images confirms the above findings. IMPRESSION: No evidence of acute pulmonary thromboembolism. Aortic Atherosclerosis (ICD10-I70.0) and Emphysema (ICD10-J43.9). Electronically Signed   By: Marybelle Killings M.D.   On: 10/11/2016 18:37    Procedures Procedures (including critical care time)  CRITICAL CARE Performed by: Fredia Sorrow Total critical care time: 30 minutes Critical care time was exclusive of separately billable procedures and treating other patients. Critical care was necessary to treat or prevent imminent or life-threatening deterioration. Critical care was time spent personally by me on the following activities: development of treatment plan with patient and/or surrogate as well as nursing, discussions with consultants, evaluation of patient's response to treatment, examination of patient, obtaining history  from patient or surrogate, ordering and performing treatments and interventions, ordering and review of laboratory studies, ordering and review of radiographic studies, pulse oximetry and re-evaluation of patient's condition.   Medications Ordered in ED Medications  0.9 %  sodium chloride infusion (not administered)  potassium chloride 10 mEq in 100 mL IVPB (not administered)  sodium chloride 0.9 % bolus 500 mL (0 mLs Intravenous Stopped 10/11/16 1753)  ondansetron (ZOFRAN) injection 4 mg (4 mg Intravenous Given 10/11/16 1636)  HYDROmorphone (DILAUDID) injection 1 mg (1 mg Intravenous Given 10/11/16 1636)  potassium chloride 10 mEq in 100 mL IVPB (0 mEq Intravenous Stopped 10/11/16 1736)  potassium chloride SA (K-DUR,KLOR-CON) CR tablet 40 mEq (40 mEq Oral Given 10/11/16 1635)  iopamidol (ISOVUE-370) 76 % injection  100 mL (100 mLs Intravenous Contrast Given 10/11/16 1738)     Initial Impression / Assessment and Plan / ED Course  I have reviewed the triage vital signs and the nursing notes.  Pertinent labs & imaging results that were available during my care of the patient were reviewed by me and considered in my medical decision making (see chart for details).    Acute onset of chest pain of one in the morning. Been persistent. Initial troponin negative EKG without acute cardiac changes. Chest x-ray negative. CT angiogram done to rule out pulmonary embolism. That was negative as well. Pain is made worse by moving of arms and sitting up very suggestive of chest wall pain.  In his workup it was noted that he had a critically low potassium and some mild renal insufficiency. For the potassium which was below 3 patient received 2, 10 mEq runs of potassium. And received 40 mEq by mouth. Patient will require admission for this low potassium. While admitted serial troponins could be done. But clinically really think that this is chest wall pain.   Final Clinical Impressions(s) / ED Diagnoses   Final  diagnoses:  Hypokalemia  Chest wall pain    New Prescriptions New Prescriptions   No medications on file     Fredia Sorrow, MD 10/11/16 [phone]

## 2016-10-11 NOTE — ED Notes (Signed)
hospitalist at bedside

## 2016-10-11 NOTE — ED Notes (Signed)
CRITICAL VALUE ALERT  Critical Value:  K 2.6  Date & Time Notied:  10/11/16 4010  Provider Notified: b daniels, rn  Orders Received/Actions taken: [email or web address] notified

## 2016-10-12 DIAGNOSIS — J449 Chronic obstructive pulmonary disease, unspecified: Secondary | ICD-10-CM | POA: Diagnosis not present

## 2016-10-12 DIAGNOSIS — R071 Chest pain on breathing: Secondary | ICD-10-CM

## 2016-10-12 DIAGNOSIS — E876 Hypokalemia: Secondary | ICD-10-CM | POA: Diagnosis not present

## 2016-10-12 DIAGNOSIS — I10 Essential (primary) hypertension: Secondary | ICD-10-CM | POA: Diagnosis not present

## 2016-10-12 DIAGNOSIS — R0789 Other chest pain: Secondary | ICD-10-CM | POA: Diagnosis present

## 2016-10-12 LAB — BASIC METABOLIC PANEL
Anion gap: 6 (ref 5–15)
BUN: 14 mg/dL (ref 6–20)
CALCIUM: 8.1 mg/dL — AB (ref 8.9–10.3)
CO2: 28 mmol/L (ref 22–32)
Chloride: 100 mmol/L — ABNORMAL LOW (ref 101–111)
Creatinine, Ser: 1.18 mg/dL (ref 0.61–1.24)
GFR calc non Af Amer: >60 mL/min (ref >60)
Glucose, Bld: 103 mg/dL — ABNORMAL HIGH (ref 65–99)
Potassium: 3.6 mmol/L (ref 3.5–5.1)
SODIUM: 134 mmol/L — AB (ref 135–145)

## 2016-10-12 LAB — CK: CK TOTAL: 233 U/L (ref 49–397)

## 2016-10-12 MED ORDER — NAPROXEN 500 MG PO TABS: 500.0000 mg | ORAL_TABLET | Freq: Two times a day (BID) | ORAL | 0 refills | Status: AC

## 2016-10-12 MED ORDER — KETOROLAC TROMETHAMINE 30 MG/ML IJ SOLN
30.0000 mg | Freq: Four times a day (QID) | INTRAMUSCULAR | Status: DC
  Administered 2016-10-12: 30 mg via INTRAVENOUS
  Filled 2016-10-12: qty 1

## 2016-10-12 MED ORDER — POTASSIUM CHLORIDE ER 20 MEQ PO TBCR: 10.0000 meq | EXTENDED_RELEASE_TABLET | Freq: Every day | ORAL | 0 refills | Status: DC

## 2016-10-12 MED ORDER — CAPSAICIN-MENTHOL-METHYL SAL 0.025-1-12 % EX CREA: 1.0000 "application " | TOPICAL_CREAM | Freq: Two times a day (BID) | CUTANEOUS | 0 refills | Status: DC

## 2016-10-12 MED ORDER — CYCLOBENZAPRINE HCL 7.5 MG PO TABS: 7.5000 mg | ORAL_TABLET | Freq: Three times a day (TID) | ORAL | 0 refills | Status: DC | PRN

## 2016-10-12 MED ORDER — LISINOPRIL 20 MG PO TABS: 20.0000 mg | ORAL_TABLET | Freq: Every day | ORAL | 0 refills | Status: DC

## 2016-10-12 NOTE — Discharge Instructions (Signed)
Chest Wall Pain °Chest wall pain is pain in or around the bones and muscles of your chest. Sometimes, an injury causes this pain. Sometimes, the cause may not be known. This pain may take several weeks or longer to get better. °Follow these instructions at home: °Pay attention to any changes in your symptoms. Take these actions to help with your pain: °· Rest as told by your health care provider. °· Avoid activities that cause pain. These include any activities that use your chest muscles or your abdominal and side muscles to lift heavy items. °· If directed, apply ice to the painful area: °¨ Put ice in a plastic bag. °¨ Place a towel between your skin and the bag. °¨ Leave the ice on for 20 minutes, 2-3 times per day. °· Take over-the-counter and prescription medicines only as told by your health care provider. °· Do not use tobacco products, including cigarettes, chewing tobacco, and e-cigarettes. If you need help quitting, ask your health care provider. °· Keep all follow-up visits as told by your health care provider. This is important. °Contact a health care provider if: °· You have a fever. °· Your chest pain becomes worse. °· You have new symptoms. °Get help right away if: °· You have nausea or vomiting. °· You feel sweaty or light-headed. °· You have a cough with phlegm (sputum) or you cough up blood. °· You develop shortness of breath. °This information is not intended to replace advice given to you by your health care provider. Make sure you discuss any questions you have with your health care provider. °Document Released: 12/26/2004 Document Revised: 05/06/2015 Document Reviewed: 03/23/2014 °Elsevier Interactive Patient Education © 2017 [employer] ° °

## 2016-10-12 NOTE — Discharge Summary (Signed)
Physician Discharge Summary  Fernando [name] GDJ:[id number] DOB: 1960/01/12 DOA: 10/11/2016  PCP: Redmond School, MD  Admit date: 10/11/2016 Discharge date: 10/12/2016  Admitted From:Home Disposition: Home  Recommendations for Outpatient Follow-up:  1. Follow up with PCP in 1-2 weeks. Please check potassium level during outpatient follow-up   Home Health: None Equipment/Devices: None  Discharge Condition: Fair CODE STATUS: Full code Diet recommendation: Regular    Discharge Diagnoses:  Principal Problem:   Costochondral chest pain   Active Problems:   Hypokalemia   Essential hypertension   COPD (chronic obstructive pulmonary disease) (HCC)   Obstructive sleep apnea  obesity (BMI=33)  Brief narrative/history of present illness 56 year old obese male with history of chest wall pain, status post left lung lobectomy for lung mass with infection in April 2017, hospitalized in August 2017 for chest pain and had stress test which was low risk study, history of COPD who presented with acute onset of left substernal and lateral chest pain worsened with deep inspiration and movement from the morning of admission. 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 Vitals in the ED were stable. He had reproducible pain on exam. EKG and serial troponins were negative. CT angiogram of the chest was negative for PE. He was also found to have severe hypokalemia (2.6) Placed on observation on telemetry.  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 Has significant reproducible pain on pressure. EKG and serial troponins negative. 2-D echo and stress test in the past 1 year have been reassuring. Symptoms responding to Toradol.  No recent chest wall trauma or viral illness. CPK normal. HIV antibody pending. I will discharge him on scheduled Naprosyn 500 mg twice a day for 5 days along  with when necessary baclofen for muscle spasms and capsaicin topical cream.   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 Patient has had hypokalemia in the past. I will discontinue his HCTZ. Added potassium supplement.  Essential hypertension Continue lisinopril. HCTZ discontinued.   Acute kidney injury/ hyponatremia Mild. Possibly due to dehydration. Improved with IV fluids.  COPD Was recommended to be on chronic home oxygen but since patient is on the road most of the time he refuses to be on it. Has quit smoking. Continue albuterol inhaler.  Hypothyroidism   continue Synthroid.  OSA Does not use CPAP at home.    Procedure: CT angiogram chest  Family communication: None at bedside  Discussion: Home   Discharge Instructions   Allergies as of 10/12/2016      Reactions   Morphine And Related Other (See Comments)   hallucinations      Medication List    STOP taking these medications   lisinopril-hydrochlorothiazide 20-12.5 MG tablet Commonly known as:  PRINZIDE,ZESTORETIC     TAKE these medications   albuterol 108 (90 Base) MCG/ACT inhaler Commonly known as:  PROVENTIL HFA;VENTOLIN HFA Inhale 2 puffs into the lungs every 6 (six) hours as needed for wheezing or shortness of breath.   ALPRAZolam 1 MG tablet Commonly known as:  XANAX Take 1 tablet (1 mg total) by mouth 3 (three) times daily as needed for anxiety.   Capsaicin-Menthol-Methyl Sal 0.025-1-12 % Crea Commonly known as:  capsaicin-methyl sal-menthol Apply 1 application topically 2 (two) times daily.   cyclobenzaprine 7.5 MG tablet Commonly known as:  FEXMID Take 1 tablet (7.5 mg  total) by mouth 3 (three) times daily as needed for muscle spasms.   levothyroxine 25 MCG tablet Commonly known as:  SYNTHROID, LEVOTHROID Take 1 tablet (25 mcg total) by mouth daily before breakfast. Medication for your thyroid.   lisinopril 20 MG  tablet Commonly known as:  PRINIVIL,ZESTRIL Take 1 tablet (20 mg total) by mouth daily.   naproxen 500 MG tablet Commonly known as:  NAPROSYN Take 1 tablet (500 mg total) by mouth 2 (two) times daily with a meal.   Potassium Chloride ER 20 MEQ Tbcr Take 10 mEq by mouth daily.      Follow-up Information    Redmond School, MD. Schedule an appointment as soon as possible for a visit in 1 week(s).   Specialty:  Internal Medicine Contact information: [address] [place] [address] [phone]          Allergies  Allergen Reactions  . Morphine And Related Other (See Comments)    hallucinations      Procedures/Studies: Dg Chest 2 View  Result Date: 10/11/2016 CLINICAL DATA:  Left-sided chest pain and shortness of breath for several hours EXAM: CHEST  2 VIEW COMPARISON:  05/01/2016 FINDINGS: The heart size and mediastinal contours are within normal limits. Both lungs are clear. The visualized skeletal structures are unremarkable. IMPRESSION: No active cardiopulmonary disease. Electronically Signed   By: Inez Catalina M.D.   On: 10/11/2016 15:37   Ct Angio Chest Pe W/cm &/or Wo Cm  Result Date: 10/11/2016 CLINICAL DATA:  Chest pain this morning EXAM: CT ANGIOGRAPHY CHEST WITH CONTRAST TECHNIQUE: Multidetector CT imaging of the chest was performed using the standard protocol during bolus administration of intravenous contrast. Multiplanar CT image reconstructions and MIPs were obtained to evaluate the vascular anatomy. CONTRAST:  100 cc Isovue 370 COMPARISON:  05/01/2016 FINDINGS: Cardiovascular: There are no filling defects in the pulmonary arterial tree to suggest acute pulmonary thromboembolism. There is no evidence of aortic dissection or aneurysm. Mild atherosclerotic calcifications of the aortic arch. Minimal LAD territory coronary artery calcification. Mediastinum/Nodes: No abnormal mediastinal adenopathy. No pericardial effusion. Lungs/Pleura: No pneumothorax. No  pleural effusion. Dependent atelectasis. Mild emphysema at the lung apices. Upper Abdomen: No acute abnormality. Musculoskeletal: No vertebral compression deformity or acute rib fracture. Review of the MIP images confirms the above findings. IMPRESSION: No evidence of acute pulmonary thromboembolism. Aortic Atherosclerosis (ICD10-I70.0) and Emphysema (ICD10-J43.9). Electronically Signed   By: Marybelle Killings M.D.   On: 10/11/2016 18:37     Subjective: Still having chest wall pain on minimal pressure.  Discharge Exam: Vitals:   10/11/16 2116 10/12/16 0546  BP: 130/86 (!) 133/95  Pulse: 85 67  Resp: 18 18  Temp: 97.9 F (36.6 C) 98.2 F (36.8 C)  SpO2: 95% 98%   Vitals:   10/11/16 1906 10/11/16 1940 10/11/16 2116 10/12/16 0546  BP: 109/84 107/74 130/86 (!) 133/95  Pulse: 81 75 85 67  Resp: 15 18 18 18   Temp:   97.9 F (36.6 C) 98.2 F (36.8 C)  TempSrc:   Oral Oral  SpO2: 94% 96% 95% 98%  Weight:   120.1 kg (264 lb 12.8 oz)   Height:   6\' 2"  (1.88 m)     General:Middle aged obese male not in distress HEENT: No pallor, moist, supple neck Chest: Tender to pressure over left sternal and lateral chest on minimal pressure, clear breath sounds bilaterally and on CVS: Normal S1 and S2, no murmurs rubs or gallop GI: Soft, nondistended, nontender Musculoskeletal: Warm, no edema  The results of significant diagnostics from this hospitalization (including imaging, microbiology, ancillary and laboratory) are listed below for reference.     Microbiology: No results found for this or any previous visit (from the past 240 hour(s)).   Labs: BNP (last 3 results) No results for input(s): BNP in the last 8760 hours. Basic Metabolic Panel:  Recent Labs Lab 10/11/16 1456 10/12/16 0519  NA 134* 134*  K 2.6* 3.6  CL 95* 100*  CO2 27 28  GLUCOSE 163* 103*  BUN 11 14  CREATININE 1.37* 1.18  CALCIUM 8.9 8.1*  MG 1.9  --    Liver Function Tests:  Recent Labs Lab 10/11/16 1456   AST 41  ALT 48  ALKPHOS 95  BILITOT 0.6  PROT 8.4*  ALBUMIN 4.3   No results for input(s): LIPASE, AMYLASE in the last 168 hours. No results for input(s): AMMONIA in the last 168 hours. CBC:  Recent Labs Lab 10/11/16 1456  WBC 7.7  NEUTROABS 5.2  HGB 15.2  HCT 43.1  MCV 95.8  PLT 235   Cardiac Enzymes:  Recent Labs Lab 10/11/16 1456 10/12/16 0519  CKTOTAL  --  233  TROPONINI <0.03  --    BNP: Invalid input(s): POCBNP CBG: No results for input(s): GLUCAP in the last 168 hours. D-Dimer No results for input(s): DDIMER in the last 72 hours. Hgb A1c No results for input(s): HGBA1C in the last 72 hours. Lipid Profile No results for input(s): CHOL, HDL, LDLCALC, TRIG, CHOLHDL, LDLDIRECT in the last 72 hours. Thyroid function studies No results for input(s): TSH, T4TOTAL, T3FREE, THYROIDAB in the last 72 hours.  Invalid input(s): FREET3 Anemia work up No results for input(s): VITAMINB12, FOLATE, FERRITIN, TIBC, IRON, RETICCTPCT in the last 72 hours. Urinalysis    Component Value Date/Time   COLORURINE YELLOW 08/26/2015 1243   APPEARANCEUR CLEAR 08/26/2015 1243   LABSPEC 1.020 08/26/2015 1243   PHURINE 6.0 08/26/2015 1243   GLUCOSEU NEGATIVE 08/26/2015 1243   HGBUR NEGATIVE 08/26/2015 1243   BILIRUBINUR NEGATIVE 08/26/2015 1243   KETONESUR NEGATIVE 08/26/2015 1243   PROTEINUR NEGATIVE 08/26/2015 1243   UROBILINOGEN 0.2 08/15/2009 0510   NITRITE NEGATIVE 08/26/2015 1243   LEUKOCYTESUR NEGATIVE 08/26/2015 1243   Sepsis Labs Invalid input(s): PROCALCITONIN,  WBC,  LACTICIDVEN Microbiology No results found for this or any previous visit (from the past 240 hour(s)).   Time coordinating discharge: < 30 minutes  SIGNED:   Louellen Molder, MD  [facility] 10/12/2016, 11:44 AM Pager   If 7PM-7AM, please contact night-coverage [email or web address] Password [email or web address]

## 2016-10-13 LAB — HIV ANTIBODY (ROUTINE TESTING W REFLEX): HIV Screen 4th Generation wRfx: NONREACTIVE

## 2016-12-16 IMAGING — US US EXTREM LOW VENOUS BILAT
1 series · 13 of 24 positions shown · non-contrast
Comparison: None.

CLINICAL DATA: Bilateral lower extremity edema.



[Series 1: us extrem low venous bilat · 0.08mm/px · 13 of 57 slices shown]
[im 1/57]
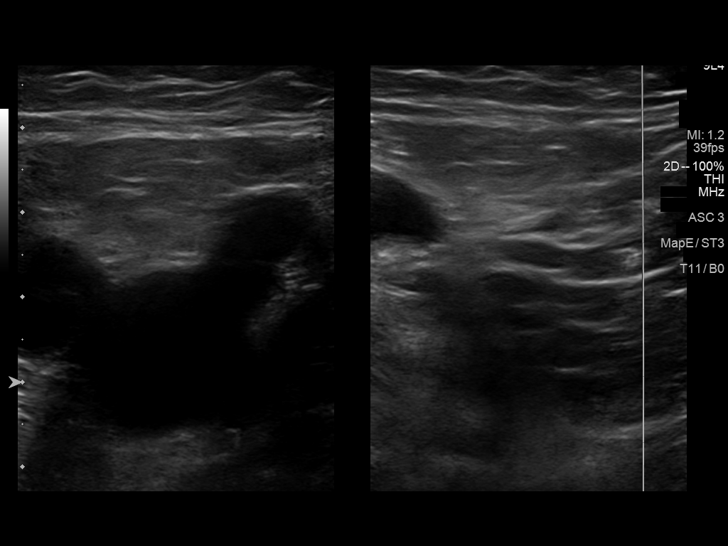
[im 5/57]
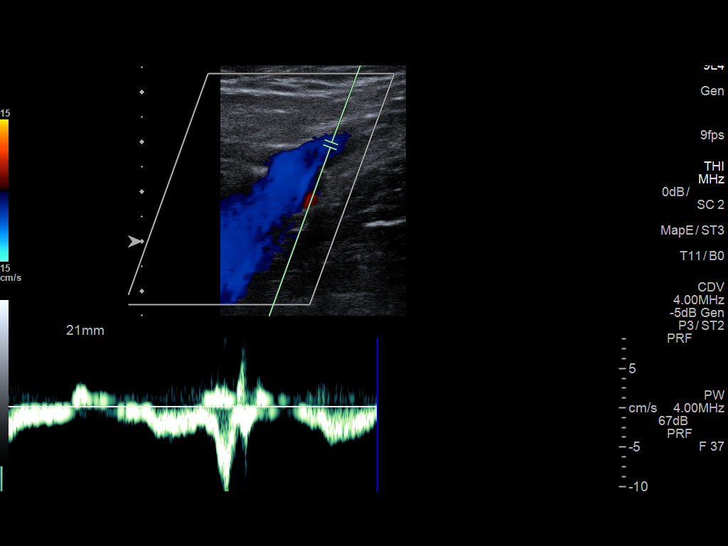
[im 10/57]
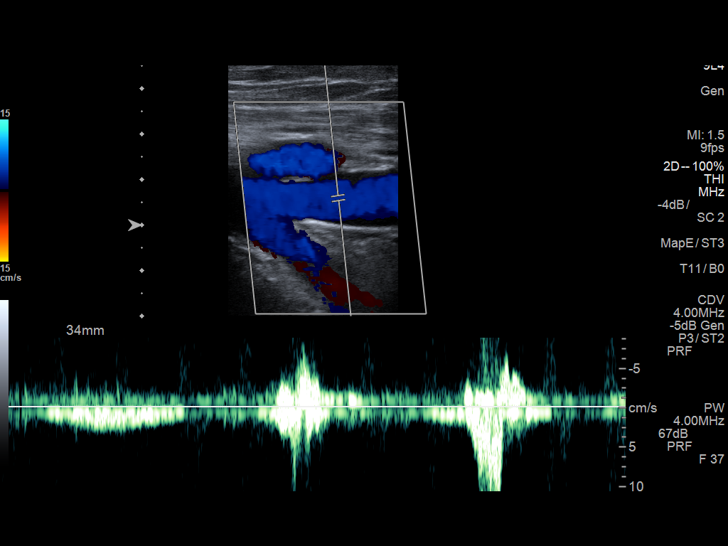
[im 15/57]
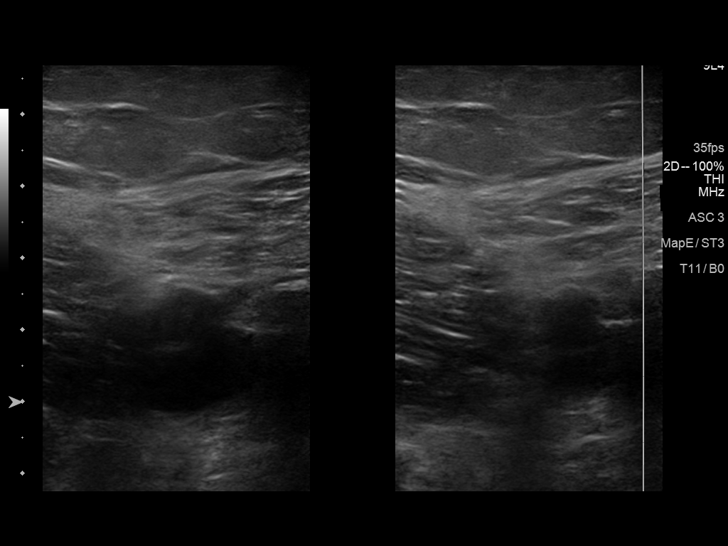
[im 20/57]
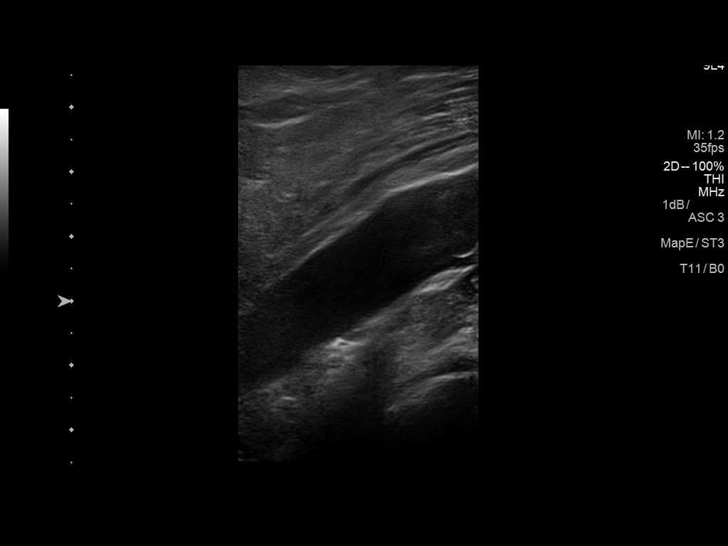
[im 25/57]
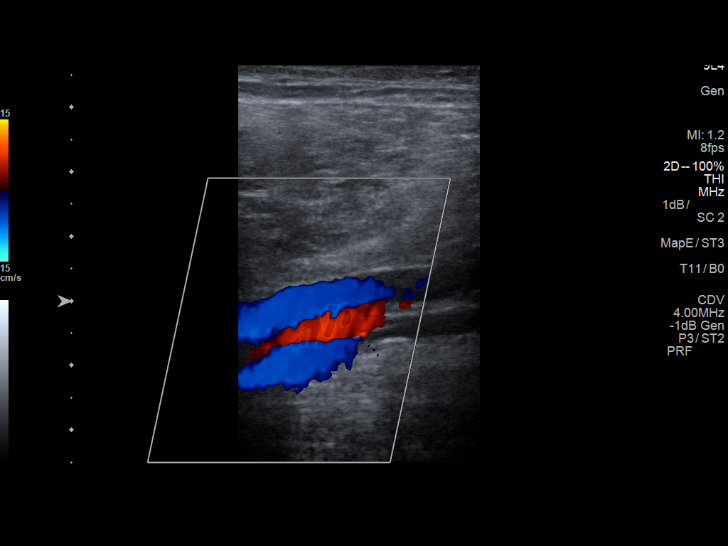
[im 30/57]
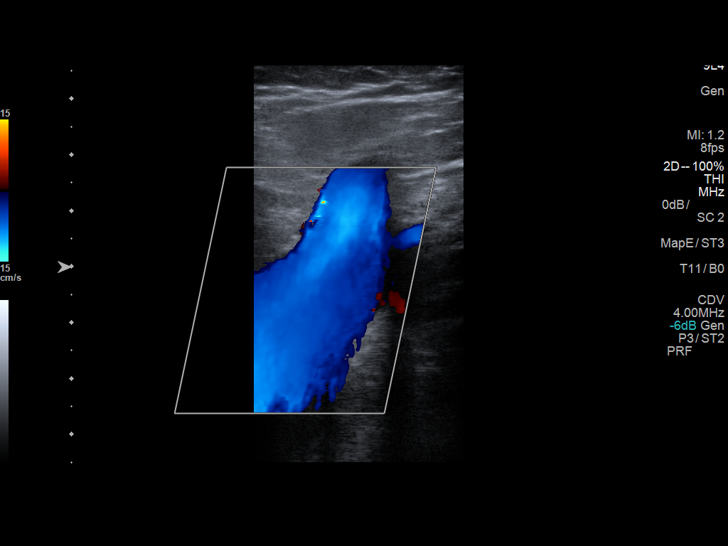
[im 32/57]
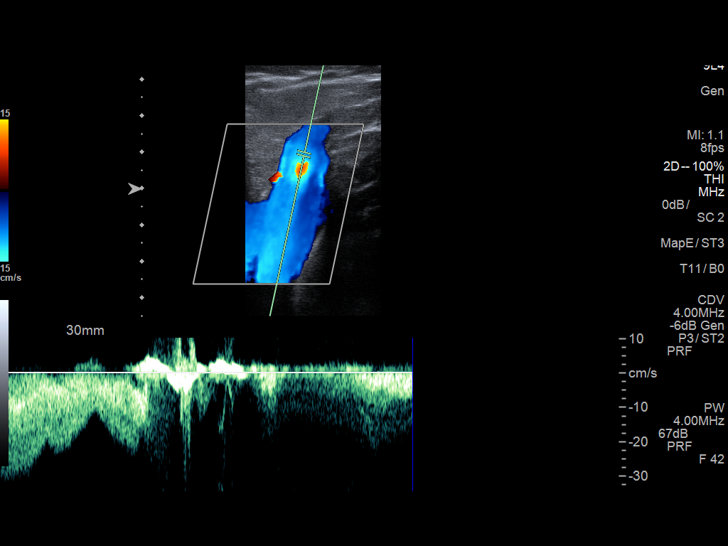
[im 37/57]
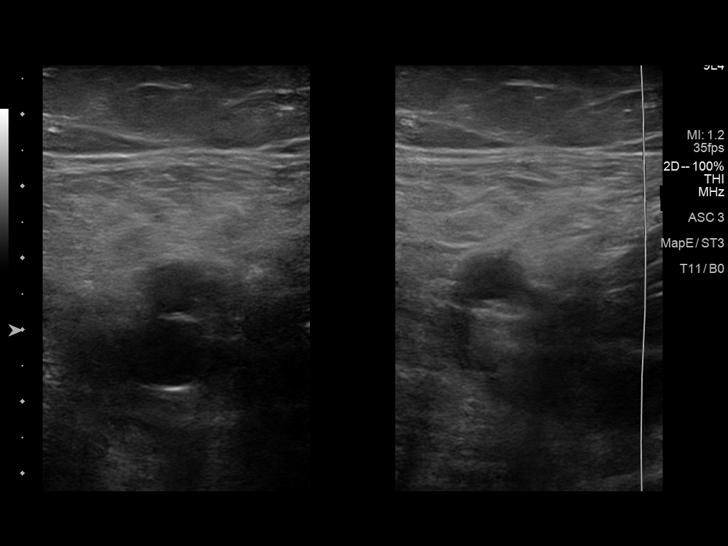
[im 42/57]
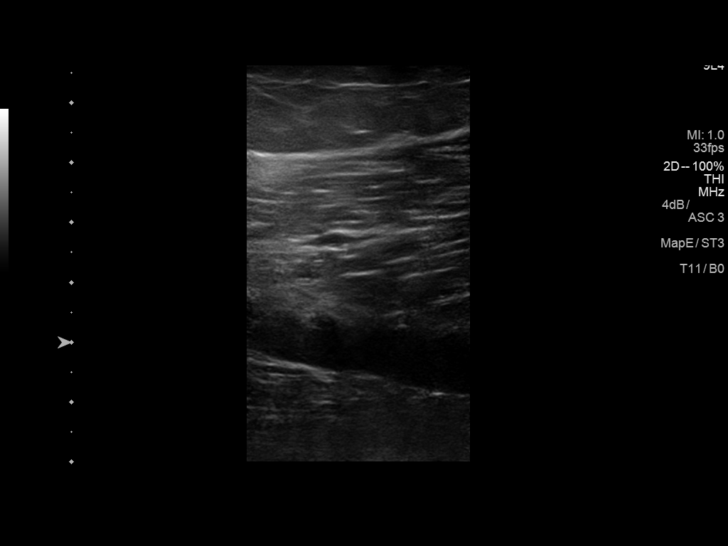
[im 47/57]
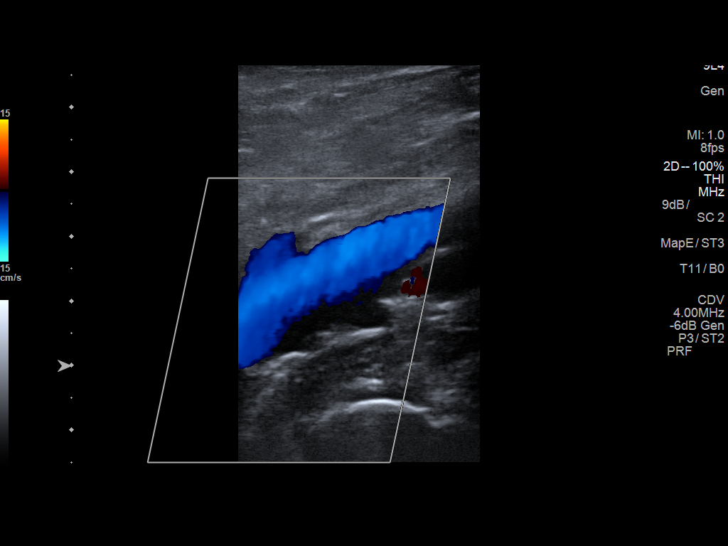
[im 52/57]
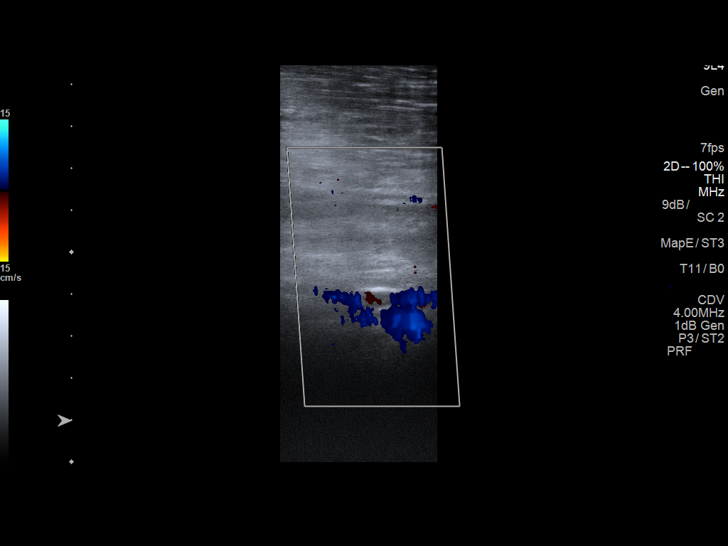
[im 57/57]
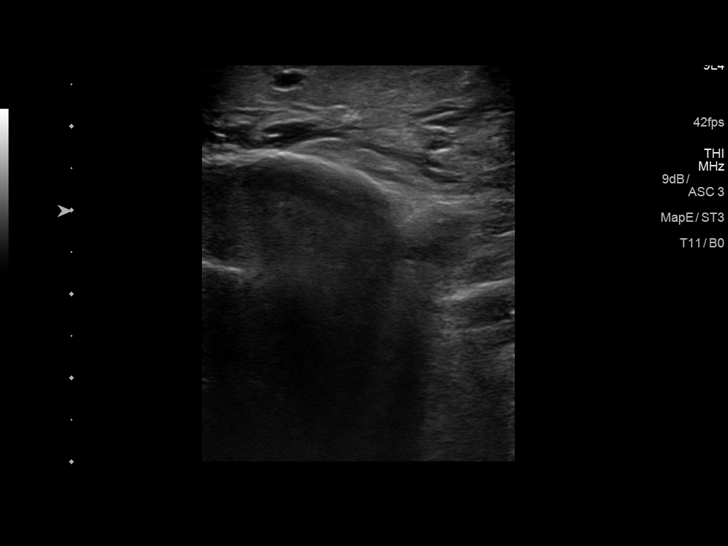

[13 of 24 positions shown; findings below may reference images not displayed]

FINDINGS: RIGHT LOWER EXTREMITY

Common Femoral Vein: No evidence of thrombus. Normal
compressibility, respiratory phasicity and response to augmentation.

Saphenofemoral Junction: No evidence of thrombus. Normal
compressibility and flow on color Doppler imaging.

Profunda Femoral Vein: No evidence of thrombus. Normal
compressibility and flow on color Doppler imaging.

Femoral Vein: No evidence of thrombus. Normal compressibility,
respiratory phasicity and response to augmentation.

Popliteal Vein: No evidence of thrombus. Normal compressibility,
respiratory phasicity and response to augmentation.

Calf Veins: No evidence of thrombus. Normal compressibility and flow
on color Doppler imaging.

Superficial Great Saphenous Vein: No evidence of thrombus. Normal
compressibility and flow on color Doppler imaging.

Venous Reflux:  None.

Other Findings: No evidence of superficial thrombophlebitis or
abnormal fluid collection.

LEFT LOWER EXTREMITY

Common Femoral Vein: No evidence of thrombus. Normal
compressibility, respiratory phasicity and response to augmentation.

Saphenofemoral Junction: No evidence of thrombus. Normal
compressibility and flow on color Doppler imaging.

Profunda Femoral Vein: No evidence of thrombus. Normal
compressibility and flow on color Doppler imaging.

Femoral Vein: No evidence of thrombus. Normal compressibility,
respiratory phasicity and response to augmentation.

Popliteal Vein: No evidence of thrombus. Normal compressibility,
respiratory phasicity and response to augmentation.

Calf Veins: No evidence of thrombus. Normal compressibility and flow
on color Doppler imaging.

Superficial Great Saphenous Vein: No evidence of thrombus. Normal
compressibility and flow on color Doppler imaging.

Venous Reflux:  None.

Other Findings: No evidence of superficial thrombophlebitis or
abnormal fluid collection.
IMPRESSION: No evidence of bilateral lower extremity deep venous thrombosis.

## 2017-01-15 ENCOUNTER — Other Ambulatory Visit (HOSPITAL_COMMUNITY): Payer: Self-pay | Admitting: Respiratory Therapy

## 2017-01-15 DIAGNOSIS — J441 Chronic obstructive pulmonary disease with (acute) exacerbation: Secondary | ICD-10-CM

## 2017-03-12 ENCOUNTER — Emergency Department (HOSPITAL_COMMUNITY): Payer: Self-pay

## 2017-03-12 ENCOUNTER — Other Ambulatory Visit: Payer: Self-pay

## 2017-03-12 ENCOUNTER — Encounter (HOSPITAL_COMMUNITY): Payer: Self-pay

## 2017-03-12 ENCOUNTER — Emergency Department (HOSPITAL_COMMUNITY)
Admission: EM | Admit: 2017-03-12 | Discharge: 2017-03-12 | Disposition: A | Discharge status: Home / Self Care | Payer: Self-pay | Attending: Emergency Medicine | Admitting: Emergency Medicine

## 2017-03-12 DIAGNOSIS — Z5321 Procedure and treatment not carried out due to patient leaving prior to being seen by health care provider: Secondary | ICD-10-CM | POA: Insufficient documentation

## 2017-03-12 DIAGNOSIS — R079 Chest pain, unspecified: Secondary | ICD-10-CM | POA: Insufficient documentation

## 2017-03-12 LAB — CBC
HCT: 46 % (ref 39.0–52.0)
Hemoglobin: 16.1 g/dL (ref 13.0–17.0)
MCH: 33.4 pg (ref 26.0–34.0)
MCHC: 35 g/dL (ref 30.0–36.0)
MCV: 95.4 fL (ref 78.0–100.0)
Platelets: 302 10*3/uL (ref 150–400)
RBC: 4.82 MIL/uL (ref 4.22–5.81)
RDW: 13.9 % (ref 11.5–15.5)
WBC: 13.4 10*3/uL — ABNORMAL HIGH (ref 4.0–10.5)

## 2017-03-12 LAB — BASIC METABOLIC PANEL
Anion gap: 13 (ref 5–15)
BUN: 7 mg/dL (ref 6–20)
CO2: 26 mmol/L (ref 22–32)
Calcium: 8.8 mg/dL — ABNORMAL LOW (ref 8.9–10.3)
Chloride: 98 mmol/L — ABNORMAL LOW (ref 101–111)
Creatinine, Ser: 1 mg/dL (ref 0.61–1.24)
GFR calc Af Amer: >60 mL/min (ref >60)
GFR calc non Af Amer: >60 mL/min (ref >60)
Glucose, Bld: 105 mg/dL — ABNORMAL HIGH (ref 65–99)
Potassium: 3 mmol/L — ABNORMAL LOW (ref 3.5–5.1)
Sodium: 137 mmol/L (ref 135–145)

## 2017-03-12 LAB — I-STAT TROPONIN, ED: Troponin i, poc: 0.01 ng/mL (ref 0.00–0.08)

## 2017-03-12 NOTE — ED Notes (Signed)
Last call in lobby. No response.

## 2017-03-12 NOTE — ED Notes (Signed)
Second call in lobby. No response. 

## 2017-03-12 NOTE — ED Triage Notes (Signed)
Patient complains of ongoing CP intermittently since 2/27. [place] and [place] and left ama. Tachypnea on arrival, alert and oriented. Describes the pain as sharp. Alert and oriented

## 2017-03-12 NOTE — ED Notes (Signed)
Pt called in lobby for vitals update. No response.

## 2017-05-23 ENCOUNTER — Other Ambulatory Visit: Payer: Self-pay

## 2017-05-23 ENCOUNTER — Emergency Department (HOSPITAL_COMMUNITY)
Admission: EM | Admit: 2017-05-23 | Discharge: 2017-05-23 | Disposition: A | Discharge status: Home / Self Care | Payer: Self-pay | Attending: Emergency Medicine | Admitting: Emergency Medicine

## 2017-05-23 ENCOUNTER — Encounter (HOSPITAL_COMMUNITY): Payer: Self-pay | Admitting: Emergency Medicine

## 2017-05-23 ENCOUNTER — Emergency Department (HOSPITAL_COMMUNITY): Payer: Self-pay

## 2017-05-23 DIAGNOSIS — F17210 Nicotine dependence, cigarettes, uncomplicated: Secondary | ICD-10-CM | POA: Insufficient documentation

## 2017-05-23 DIAGNOSIS — E876 Hypokalemia: Secondary | ICD-10-CM | POA: Insufficient documentation

## 2017-05-23 DIAGNOSIS — Z79899 Other long term (current) drug therapy: Secondary | ICD-10-CM | POA: Insufficient documentation

## 2017-05-23 DIAGNOSIS — I10 Essential (primary) hypertension: Secondary | ICD-10-CM | POA: Insufficient documentation

## 2017-05-23 DIAGNOSIS — E039 Hypothyroidism, unspecified: Secondary | ICD-10-CM | POA: Insufficient documentation

## 2017-05-23 DIAGNOSIS — R1084 Generalized abdominal pain: Secondary | ICD-10-CM | POA: Insufficient documentation

## 2017-05-23 LAB — URINALYSIS, ROUTINE W REFLEX MICROSCOPIC
BACTERIA UA: NONE SEEN
Bilirubin Urine: NEGATIVE
GLUCOSE, UA: NEGATIVE mg/dL
Hgb urine dipstick: NEGATIVE
KETONES UR: NEGATIVE mg/dL
LEUKOCYTES UA: NEGATIVE
NITRITE: NEGATIVE
PROTEIN: 30 mg/dL — AB
Specific Gravity, Urine: 1.023 (ref 1.005–1.030)
pH: 5 (ref 5.0–8.0)

## 2017-05-23 LAB — CBC
HEMATOCRIT: 42.2 % (ref 39.0–52.0)
HEMOGLOBIN: 14.5 g/dL (ref 13.0–17.0)
MCH: 32.7 pg (ref 26.0–34.0)
MCHC: 34.4 g/dL (ref 30.0–36.0)
MCV: 95.3 fL (ref 78.0–100.0)
Platelets: 218 10*3/uL (ref 150–400)
RBC: 4.43 MIL/uL (ref 4.22–5.81)
RDW: 13.8 % (ref 11.5–15.5)
WBC: 8.7 10*3/uL (ref 4.0–10.5)

## 2017-05-23 LAB — COMPREHENSIVE METABOLIC PANEL
ALBUMIN: 3.7 g/dL (ref 3.5–5.0)
ALT: 41 U/L (ref 17–63)
ANION GAP: 11 (ref 5–15)
AST: 42 U/L — ABNORMAL HIGH (ref 15–41)
Alkaline Phosphatase: 83 U/L (ref 38–126)
BUN: 8 mg/dL (ref 6–20)
CALCIUM: 8.6 mg/dL — AB (ref 8.9–10.3)
CO2: 28 mmol/L (ref 22–32)
Chloride: 97 mmol/L — ABNORMAL LOW (ref 101–111)
Creatinine, Ser: 1.15 mg/dL (ref 0.61–1.24)
GFR calc non Af Amer: >60 mL/min (ref >60)
Glucose, Bld: 133 mg/dL — ABNORMAL HIGH (ref 65–99)
POTASSIUM: 2.8 mmol/L — AB (ref 3.5–5.1)
Sodium: 136 mmol/L (ref 135–145)
Total Bilirubin: 0.6 mg/dL (ref 0.3–1.2)
Total Protein: 7.5 g/dL (ref 6.5–8.1)

## 2017-05-23 LAB — LIPASE, BLOOD: Lipase: 26 U/L (ref 11–51)

## 2017-05-23 MED ORDER — SODIUM CHLORIDE 0.9 % IV BOLUS
1000.0000 mL | Freq: Once | INTRAVENOUS | Status: AC
  Administered 2017-05-23: 1000 mL via INTRAVENOUS

## 2017-05-23 MED ORDER — HYDROMORPHONE HCL 1 MG/ML IJ SOLN
1.0000 mg | Freq: Once | INTRAMUSCULAR | Status: AC
  Administered 2017-05-23: 1 mg via INTRAVENOUS
  Filled 2017-05-23: qty 1

## 2017-05-23 MED ORDER — FAMOTIDINE 20 MG PO TABS
20.0000 mg | ORAL_TABLET | Freq: Once | ORAL | Status: AC
Start: 2017-05-23 — End: 2017-05-23
  Administered 2017-05-23: 20 mg via ORAL
  Filled 2017-05-23: qty 1

## 2017-05-23 MED ORDER — POTASSIUM CHLORIDE CRYS ER 20 MEQ PO TBCR
20.0000 meq | EXTENDED_RELEASE_TABLET | Freq: Once | ORAL | Status: AC
  Administered 2017-05-23: 20 meq via ORAL
  Filled 2017-05-23: qty 1

## 2017-05-23 MED ORDER — IOPAMIDOL (ISOVUE-300) INJECTION 61%
100.0000 mL | Freq: Once | INTRAVENOUS | Status: AC | PRN
  Administered 2017-05-23: 100 mL via INTRAVENOUS

## 2017-05-23 MED ORDER — SUCRALFATE 1 GM/10ML PO SUSP
1.0000 g | Freq: Three times a day (TID) | ORAL | Status: DC
  Administered 2017-05-23: 1 g via ORAL
  Filled 2017-05-23: qty 10

## 2017-05-23 MED ORDER — POTASSIUM CHLORIDE 10 MEQ/100ML IV SOLN
10.0000 meq | Freq: Once | INTRAVENOUS | Status: AC
  Administered 2017-05-23: 10 meq via INTRAVENOUS
  Filled 2017-05-23: qty 100

## 2017-05-23 MED ORDER — FAMOTIDINE 20 MG PO TABS: 20.0000 mg | ORAL_TABLET | Freq: Two times a day (BID) | ORAL | 0 refills | Status: DC

## 2017-05-23 MED ORDER — SUCRALFATE 1 GM/10ML PO SUSP: 1.0000 g | Freq: Three times a day (TID) | ORAL | 0 refills | Status: DC

## 2017-05-23 NOTE — ED Provider Notes (Signed)
[facility] EMERGENCY DEPARTMENT Provider Note   CSN: [id number] Arrival date & time: 05/23/17  1430     History   Chief Complaint Chief Complaint  Patient presents with  . Abdominal Pain    HPI Fernando [name] is a 57 y.o. male.  He is presenting today with severe  periumbilical abdominal pain that is been progressive for 3 days.  He states it hurts to cough or move.  He has had nausea and feeling chilled and sweats.  He says his stool is been hard but also loose but with no signs of bleeding.  States he vomited a few times and there was some pink in the vomitus.  He is having no dysuria.  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  He is tried nothing for it.  He can think of any change in food or medications that might of exacerbated this.  The history is provided by the patient.  Abdominal Pain   This is a new problem. The current episode started more than 2 days ago. The problem occurs constantly. The problem has been rapidly worsening. The pain is associated with an unknown factor. The pain is located in the generalized abdominal region and epigastric region. The quality of the pain is sharp. The pain is severe. Associated symptoms include fever, diarrhea, nausea, vomiting and constipation. Pertinent negatives include anorexia, belching, hematochezia, melena, dysuria, frequency, hematuria, headaches and arthralgias. The symptoms are aggravated by coughing. Nothing relieves the symptoms.    Past Medical History:  Diagnosis Date  . Acute respiratory failure with hypoxia (HCC) 08/27/2015   Mild-Oxygen saturation 88% with ambulation.  . Allergy   . Anal fissure   . 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 Complication of anesthesia    woke up during colonoscopy  . HCAP (healthcare-associated pneumonia) 05/12/2015  . Hypertension 12/30/2011  . Hypothyroidism 08/27/2015   New diagnosis  . Loose stools   . Lung mass    hx of left lower benign  mass  . Obstructive sleep apnea 08/27/2015   Per history only; not confirmed with a sleep study yet.  Marland Kitchen Peri-rectal abscess    multiple.   . Pneumonia   . Shortness of breath dyspnea    with exertion  . 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 Tobacco abuse   . Umbilical hernia    has been repaired    Patient Active Problem List   Diagnosis Date Noted  . Costochondral chest pain 10/12/2016  . Hypothyroidism 08/27/2015  . Acute respiratory failure with hypoxia ([place]) 08/27/2015  . COPD (chronic obstructive pulmonary disease) ([place]) 08/27/2015  . Obstructive sleep apnea 08/27/2015  . Dyspnea on exertion 08/26/2015  . Left sided chest pain 08/26/2015  . Obesity 08/26/2015  . Anal fistula 08/26/2015  . Pedal edema 08/26/2015  . Hypokalemia 08/26/2015  . Chronic diarrhea 08/26/2015  . Essential hypertension 08/26/2015  . Pain in the chest   . HCAP (healthcare-associated pneumonia) 05/12/2015  . Nodule of left lung 05/03/2015  . Lung mass 03/30/2015  . Peri-rectal abscess 03/07/2015  . Abnormal CXR 03/07/2015  . Scrotal wall abscess 05/01/2013  . Cellulitis 05/01/2013  . Hematuria 04/30/2013  . Cellulitis of scrotum 04/29/2013  . Tick bite 04/29/2013  . Hypertension 12/30/2011    Past Surgical History:  Procedure Laterality Date  . APPENDECTOMY    . CERVICAL FUSION    . COLONOSCOPY    .  CYSTOSCOPY N/A 05/01/2013   Procedure: CYSTOSCOPY;  Surgeon: Marissa Nestle, MD;  Location: [facility];  Service: Urology;  Laterality: N/A;  . EVALUATION UNDER ANESTHESIA WITH FISTULECTOMY N/A 06/23/2015   Procedure: ANAL EXAM UNDER ANESTHESIA  FISTULOTOMY;  Surgeon: Leighton Ruff, MD;  Location: [place];  Service: General;  Laterality: N/A;  . FACIAL COSMETIC SURGERY    . HERNIA REPAIR    . INCISION AND DRAINAGE ABSCESS Right 05/01/2013   Procedure: INCISION AND DRAINAGE RIGHT SCROTAL ABSCESS;  Surgeon: Marissa Nestle, MD;  Location: [facility];  Service: Urology;   Laterality: Right;  . INCISION AND DRAINAGE PERIRECTAL ABSCESS    . KNEE ARTHROSCOPY    . SPINE SURGERY     cervical  . VIDEO ASSISTED THORACOSCOPY (VATS)/ LOBECTOMY Left 05/03/2015   Procedure: LEFT VIDEO ASSISTED THORACOSCOPY (VATS)/ LEFT LOWER LOBECTOMY, ON-Q CATHETER PLACEMENT;  Surgeon: Melrose Nakayama, MD;  Location: [place];  Service: Thoracic;  Laterality: Left;  Marland Kitchen VIDEO BRONCHOSCOPY WITH ENDOBRONCHIAL ULTRASOUND N/A 04/22/2015   Procedure: VIDEO BRONCHOSCOPY WITH ENDOBRONCHIAL ULTRASOUND;  Surgeon: Melrose Nakayama, MD;  Location: [place];  Service: Thoracic;  Laterality: N/A;        Home Medications    Prior to Admission medications   Medication Sig Start Date End Date Taking? Authorizing Provider  albuterol (PROVENTIL HFA;VENTOLIN HFA) 108 (90 Base) MCG/ACT inhaler Inhale 2 puffs into the lungs every 6 (six) hours as needed for wheezing or shortness of breath. 03/12/15   Rai, Vernelle Emerald, MD  ALPRAZolam Duanne Moron) 1 MG tablet Take 1 tablet (1 mg total) by mouth 3 (three) times daily as needed for anxiety. 03/12/15   Rai, Vernelle Emerald, MD  Capsaicin-Menthol-Methyl Sal (CAPSAICIN-METHYL SAL-MENTHOL) 0.025-1-12 % CREA Apply 1 application topically 2 (two) times daily. 10/12/16   Dhungel, Nishant, MD  cyclobenzaprine (FEXMID) 7.5 MG tablet Take 1 tablet (7.5 mg total) by mouth 3 (three) times daily as needed for muscle spasms. 10/12/16   Dhungel, Flonnie Overman, MD  levothyroxine (SYNTHROID, LEVOTHROID) 25 MCG tablet Take 1 tablet (25 mcg total) by mouth daily before breakfast. Medication for your thyroid. 08/27/15   Rexene Alberts, MD  lisinopril (PRINIVIL,ZESTRIL) 20 MG tablet Take 1 tablet (20 mg total) by mouth daily. 10/12/16 11/11/16  Dhungel, Flonnie Overman, MD  potassium chloride 20 MEQ TBCR Take 10 mEq by mouth daily. 10/12/16   Dhungel, Flonnie Overman, MD    Family History Family History  Problem Relation Age of Onset  . Breast cancer Mother   . Thyroid disease Mother     Social History Social  History   Tobacco Use  . Smoking status: Current Every Day Smoker    Packs/day: 0.50    Years: 15.00    Pack years: 7.50    Types: Cigarettes    Last attempt to quit: 03/05/2015    Years since quitting: 2.2  . Smokeless tobacco: Never Used  Substance Use Topics  . Alcohol use: Yes    Alcohol/week: 0.0 oz    Comment: occasional beer  . Drug use: No     Allergies   Morphine and related   Review of Systems Review of Systems  Constitutional: Positive for chills and fever.  HENT: Negative for ear pain and sore throat.   Eyes: Negative for pain and visual disturbance.  Respiratory: Negative for cough and shortness of breath.   Cardiovascular: Negative for chest pain and palpitations.  Gastrointestinal: Positive for abdominal pain, constipation, diarrhea, nausea and vomiting. Negative for anorexia, hematochezia and  melena.  Genitourinary: Negative for dysuria, frequency and hematuria.  Musculoskeletal: Negative for arthralgias and back pain.  Skin: Negative for color change and rash.  Neurological: Negative for seizures, syncope and headaches.  All other systems reviewed and are negative.    Physical Exam Updated Vital Signs BP (!) 148/95 (BP Location: Right Arm)   Pulse 77   Temp 98 F (36.7 C) (Oral)   Resp 20   Ht 6' (1.829 m)   Wt 120.7 kg (266 lb)   SpO2 95%   BMI 36.08 kg/m   Physical Exam  Constitutional: He appears well-developed and well-nourished.  HENT:  Head: Normocephalic and atraumatic.  Eyes: Conjunctivae are normal.  Neck: Neck supple.  Cardiovascular: Normal rate and regular rhythm.  No murmur heard. Pulmonary/Chest: Effort normal. No respiratory distress. He has wheezes (scattered).  Abdominal: Soft. He exhibits distension. He exhibits no mass. There is generalized tenderness. There is no rigidity and no guarding.  Musculoskeletal: Normal range of motion. He exhibits no tenderness or deformity.  Neurological: He is alert.  Skin: Skin is warm  and dry.  Psychiatric: He has a normal mood and affect.  Nursing note and vitals reviewed.    ED Treatments / Results  Labs (all labs ordered are listed, but only abnormal results are displayed) Labs Reviewed  COMPREHENSIVE METABOLIC PANEL - Abnormal; Notable for the following components:      Result Value   Potassium 2.8 (*)    Chloride 97 (*)    Glucose, Bld 133 (*)    Calcium 8.6 (*)    AST 42 (*)    All other components within normal limits  URINALYSIS, ROUTINE W REFLEX MICROSCOPIC - Abnormal; Notable for the following components:   APPearance HAZY (*)    Protein, ur 30 (*)    All other components within normal limits  LIPASE, BLOOD  CBC    EKG None  Radiology Ct Abdomen Pelvis W Contrast  Result Date: 05/23/2017 CLINICAL DATA:  Abdominal pain, acute, generalized EXAM: CT ABDOMEN AND PELVIS WITH CONTRAST TECHNIQUE: Multidetector CT imaging of the abdomen and pelvis was performed using the standard protocol following bolus administration of intravenous contrast. CONTRAST:  162mL ISOVUE-300 IOPAMIDOL (ISOVUE-300) INJECTION 61% COMPARISON:  08/26/2015 FINDINGS: Lower chest: Mitral annular calcification, mild. History of left lower lobectomy. Hepatobiliary: Negative for liver lesion. Low-density appearance of the liver that resolves on the delayed phase.No evidence of biliary obstruction or stone. Pancreas: Unremarkable. Spleen: Unremarkable. Adrenals/Urinary Tract: Negative adrenals. No hydronephrosis or stone. Unremarkable bladder. Stomach/Bowel: No obstruction. Appendectomy. No inflammatory changes. Vascular/Lymphatic: No acute vascular abnormality. No mass or adenopathy. Reproductive:Prostate enlargement with central gland projecting into the bladder base, also seen previously. Other: No ascites or pneumoperitoneum. Fatty left inguinal canal expansion. Musculoskeletal: No acute abnormalities. 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 L5-S1 disc narrowing. Lower lumbar  facet arthropathy. IMPRESSION: 1. No acute finding or specific explanation for pain. 2. Chronic findings are stable from 2017 and described above. Electronically Signed   By: Monte Fantasia M.D.   On: 05/23/2017 20:46    Procedures Procedures (including critical care time)  Medications Ordered in ED Medications  HYDROmorphone (DILAUDID) injection 1 mg (has no administration in time range)  sodium chloride 0.9 % bolus 1,000 mL (has no administration in time range)     Initial Impression / Assessment and Plan / ED Course  I have reviewed the triage vital signs and the nursing notes.  Pertinent labs & imaging results that were available during  my care of the patient were reviewed by me and considered in my medical decision making (see chart for details).  Clinical Course as of May 25 1032  Wed May 23, 2017  2103 Patient's lab work and imaging were unremarkable other than a low potassium which he says they tell him often when he comes to the emergency department.  We have given him some oral and IV potassium.  I am going to give him a dose of some Carafate and an antacid and he is going to follow-up with his primary care doctor for further evaluation.   [MB]    Clinical Course User Index [MB] Hayden Rasmussen, MD    Final Clinical Impressions(s) / ED Diagnoses   Final diagnoses:  Generalized abdominal pain  Hypokalemia    ED Discharge Orders        Ordered    sucralfate (CARAFATE) 1 GM/10ML suspension  3 times daily with meals & bedtime     05/23/17 2105    famotidine (PEPCID) 20 MG tablet  2 times daily     05/23/17 2105       Hayden Rasmussen, MD 05/25/17 1035

## 2017-05-23 NOTE — Discharge Instructions (Addendum)
Your evaluated in the emergency department for abdominal pain.  You had blood work and a CAT scan that did not show an obvious cause of your symptoms.  We are prescribing you some acid medication and some Carafate to help coat your stomach.  You will need to contact your primary care doctor to be evaluated and have further work-up for your symptoms.  You should return to the department department if any worsening symptoms.

## 2017-05-23 NOTE — ED Triage Notes (Signed)
Pt reports abd pain for 4 days and states "I feel like I'm bleeding internally". Has had problems with constipation in the past and last had a hard stool yesterday, no blood. No hx of internal bleeding, diverticulitis, or hemorrhoids in the past. Several months ago pt had CT scan that they were concerned with a mass for possible CA, pt has been having blood tinged sputum with coughing intermittently.

## 2017-07-24 IMAGING — DX DG ABDOMEN 2V
3 series · 3 of 3 positions shown · non-contrast
Comparison: April 24, 2015

CLINICAL DATA: Abdominal pain

EXAM:
ABDOMEN - 2 VIEW

[abdomen erect]
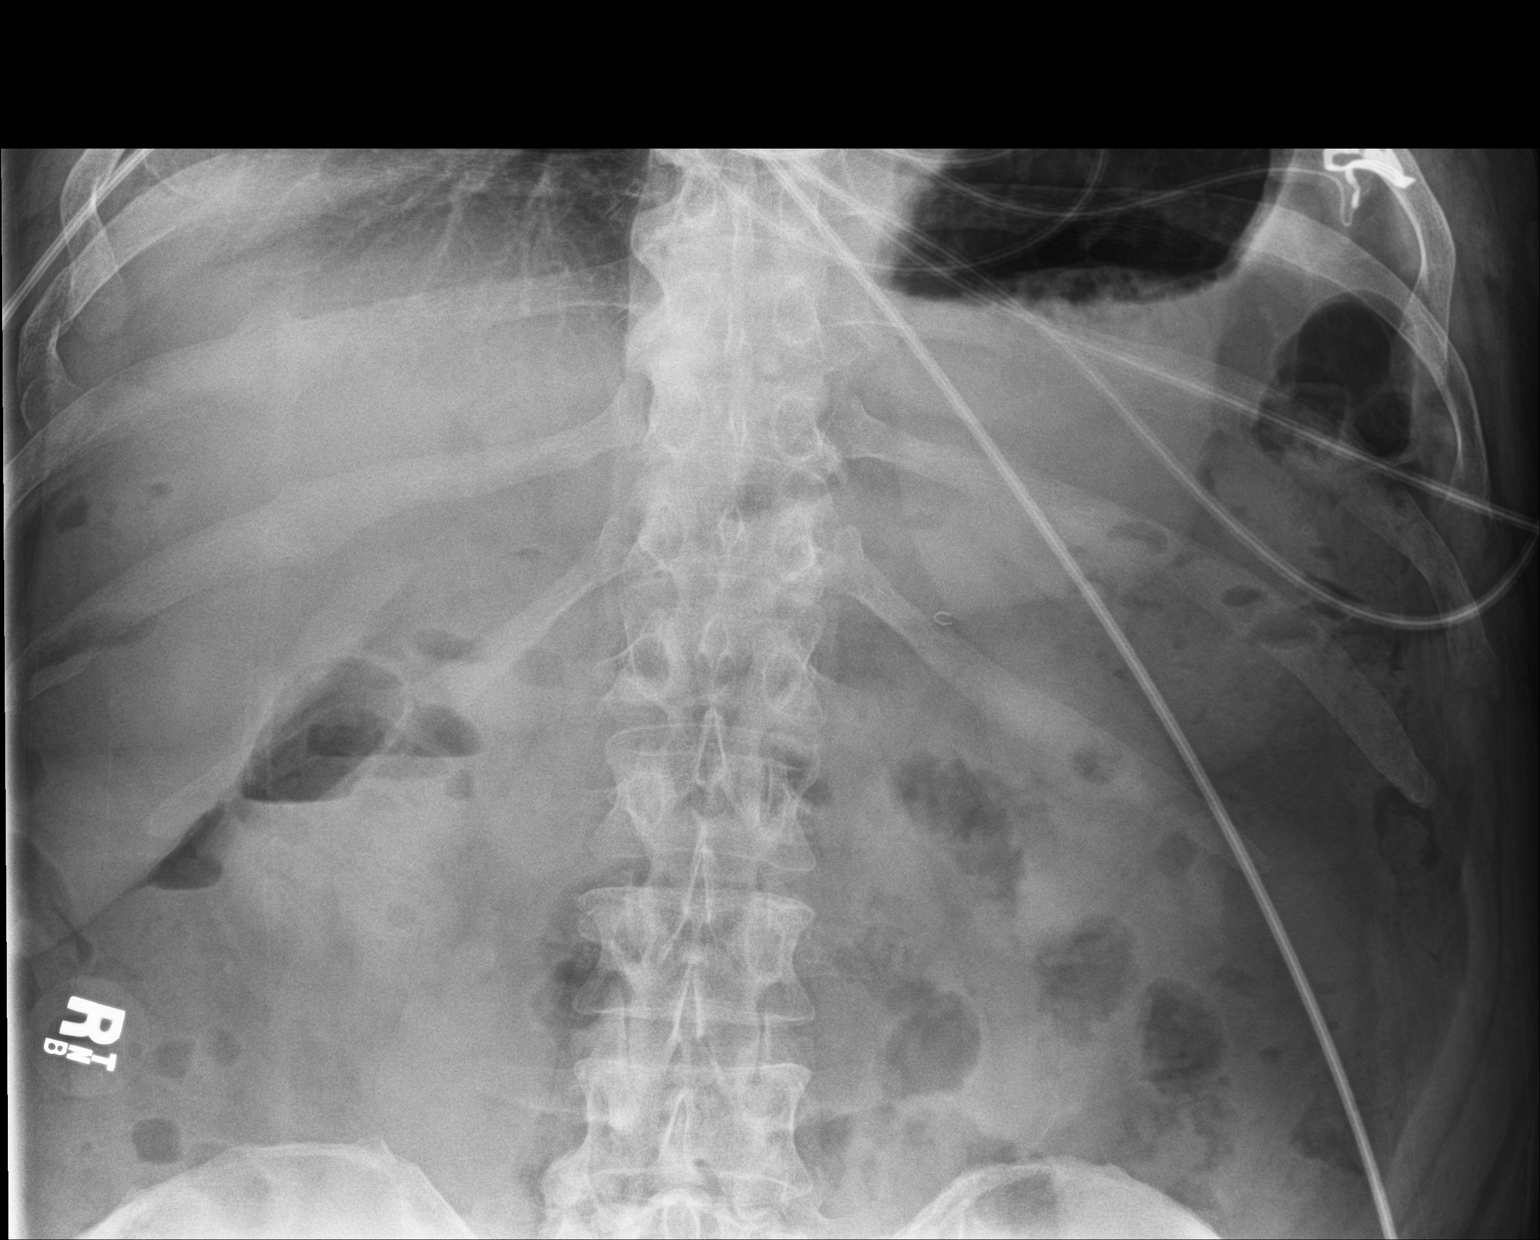

[abdomen supine (1 of 2)]
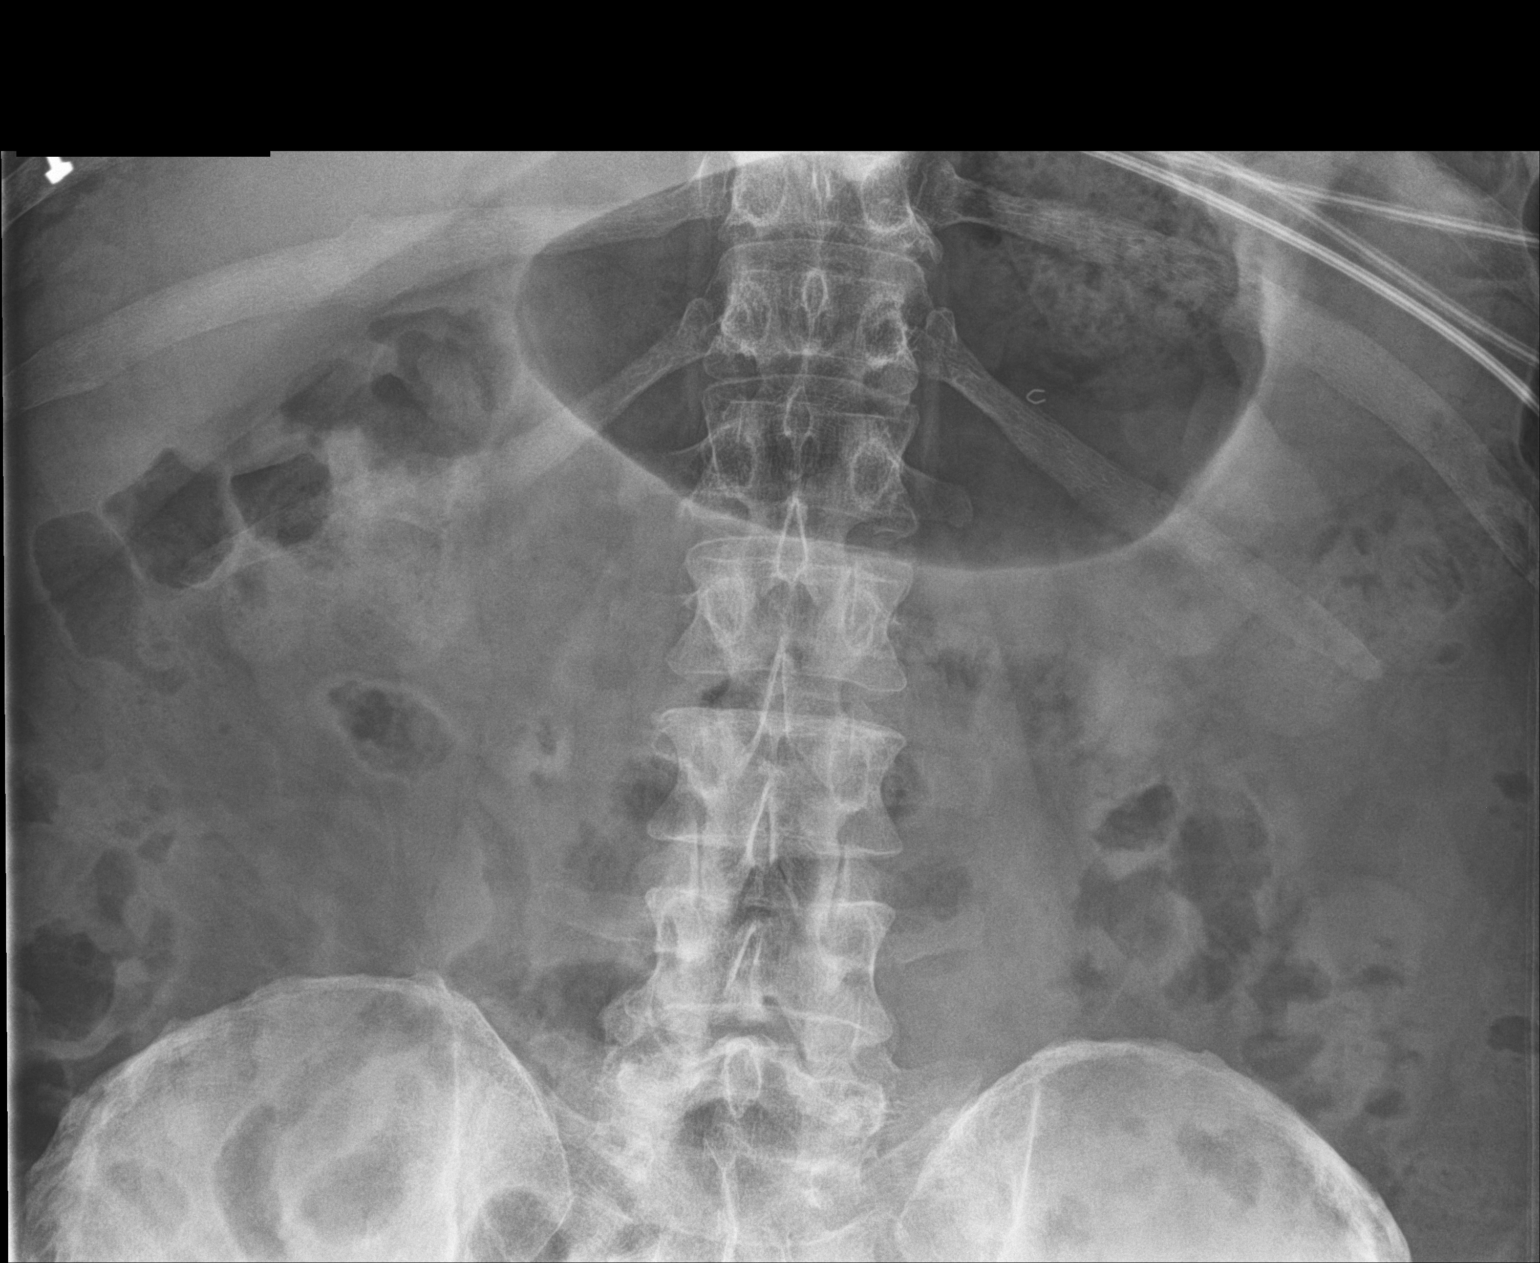

[abdomen supine (2 of 2)]
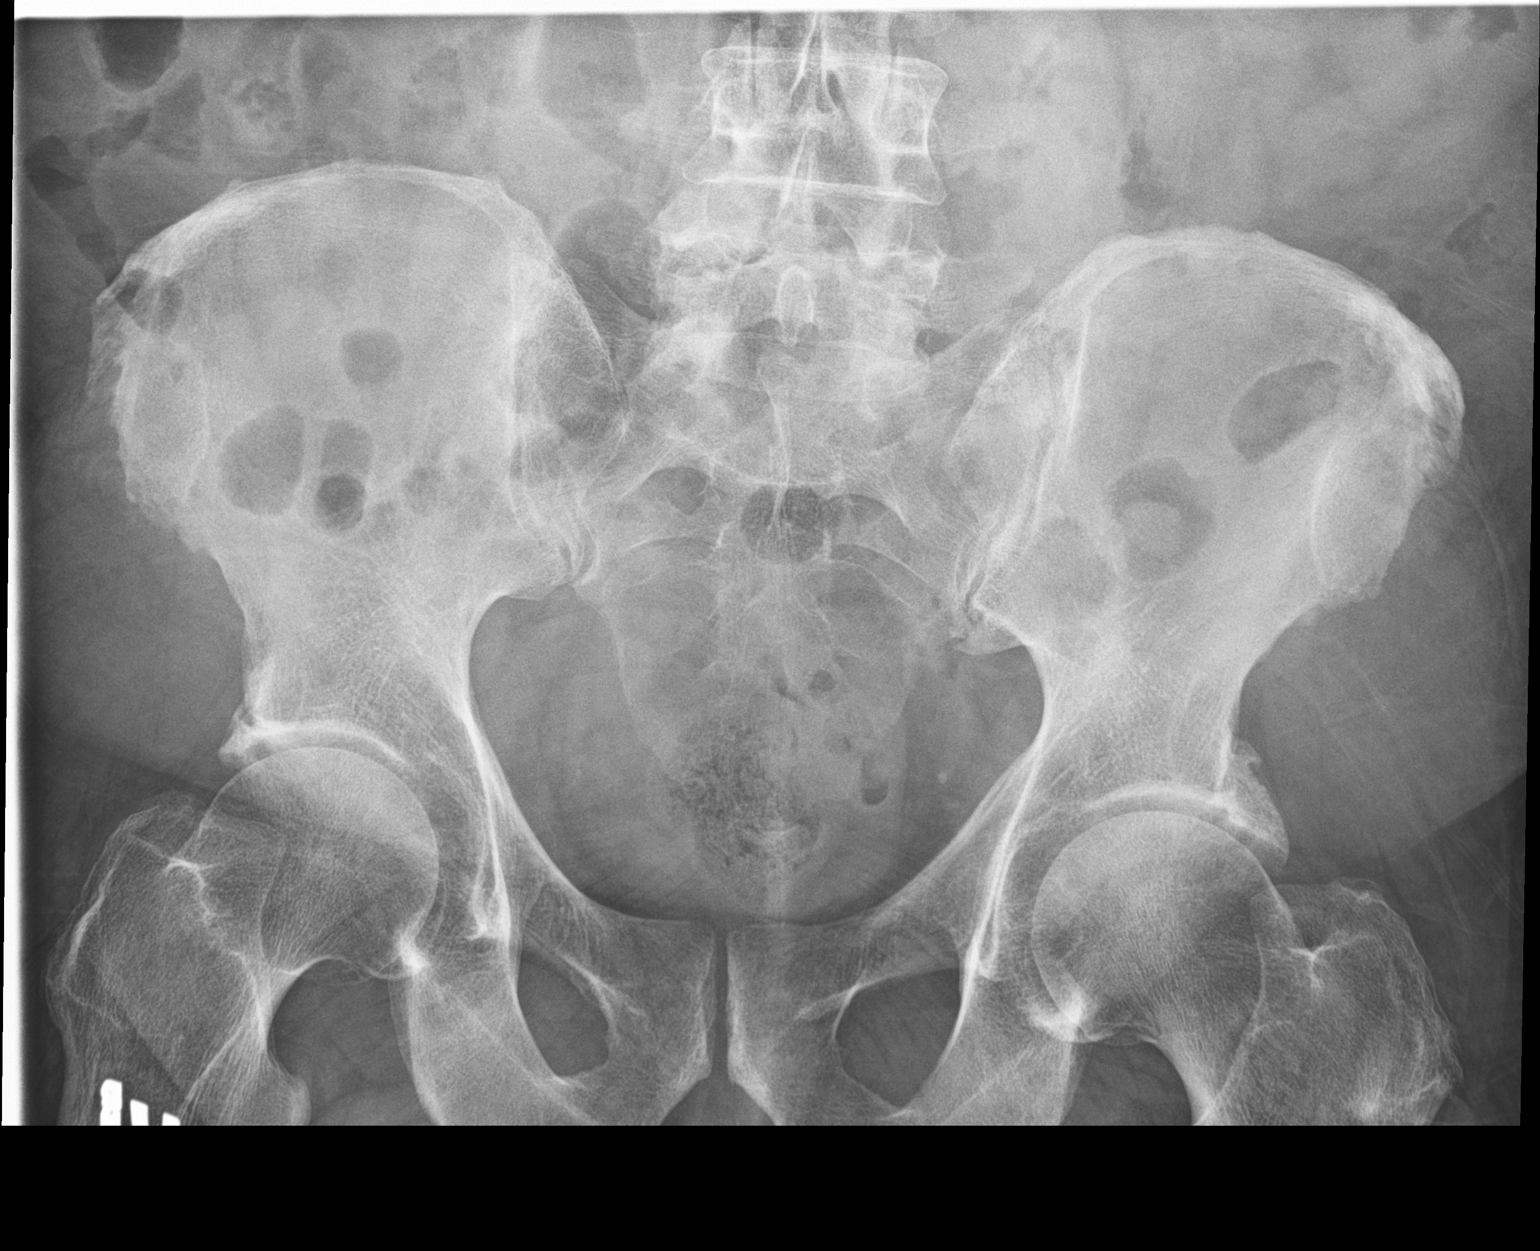

[3 of 3 positions shown; findings below may reference images not displayed]

FINDINGS: Supine and upright images were obtained. There is moderate stool
throughout the colon. There is no appreciable bowel dilatation.
However, there are multiple air-fluid levels scattered throughout
the abdomen. No free air evident. Visualized lung bases are clear.
There are old healed fractures of the posterior right tenth and
eleventh ribs.
IMPRESSION: Scattered air-fluid levels without bowel dilatation. Suspect early
ileus or enteritis. Obstruction is felt to be less likely. No free
air.

## 2017-07-24 IMAGING — CT CT ABD-PELV W/ CM
2 of 5 series · 16 of 46 positions shown, 18 images · IV contrast (iopamidol)
Comparison: CT abdomen dated [DATE].

CLINICAL DATA: Shortness of breath with abdominal pain and
distention for 4 days.

History of umbilical hernia repair, anal fissure repair [REDACTED],
appendectomy.
EXAM:
CT ABDOMEN AND PELVIS WITH CONTRAST
TECHNIQUE: Multidetector CT imaging of the abdomen and pelvis was performed
using the standard protocol following bolus administration of
intravenous contrast.
CONTRAST:  100mL 89ZI1Q-LDD IOPAMIDOL (89ZI1Q-LDD) INJECTION 61%

[Series 2: routine abd pel with · axial · 0.93mm/px · z∈[-474,-48]mm · 13 of 96 slices shown, 15 images]
[im 6/96  soft-tissue]
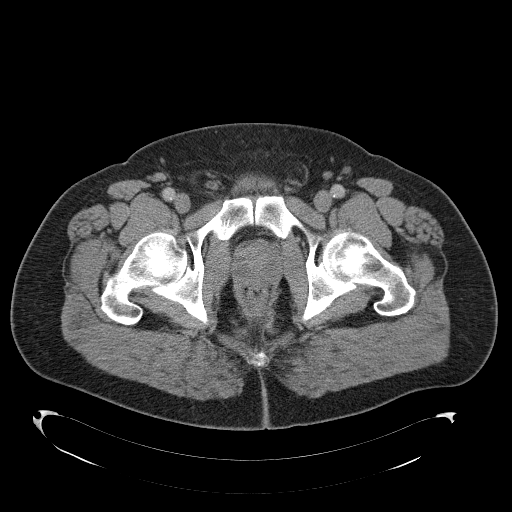
[im 6/96  bone]
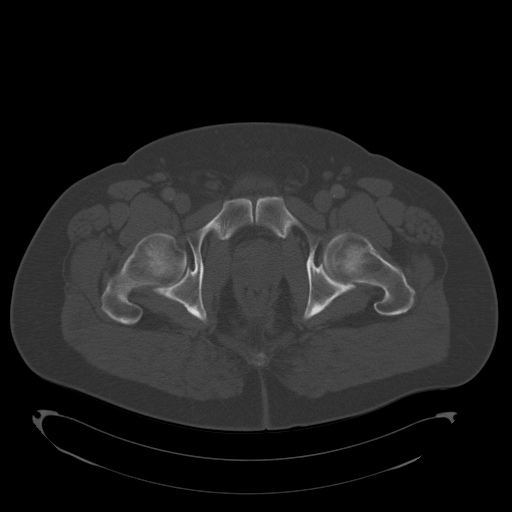
[im 16/96  soft-tissue]
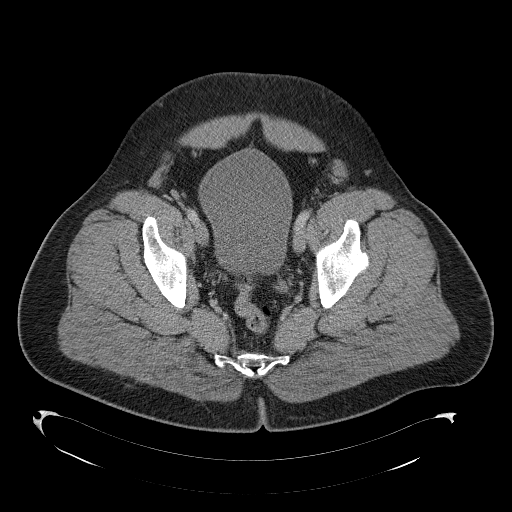
[im 21/96  soft-tissue]
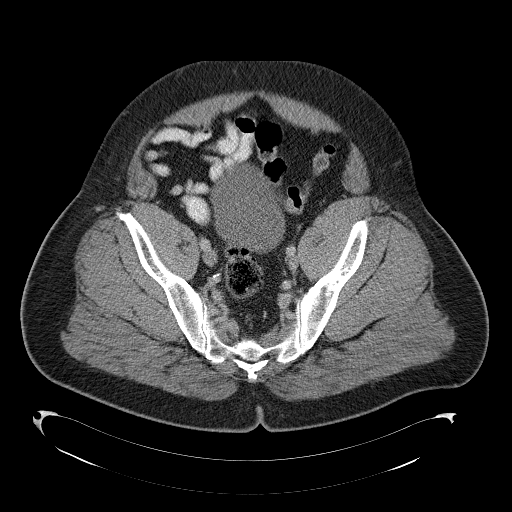
[im 26/96  soft-tissue]
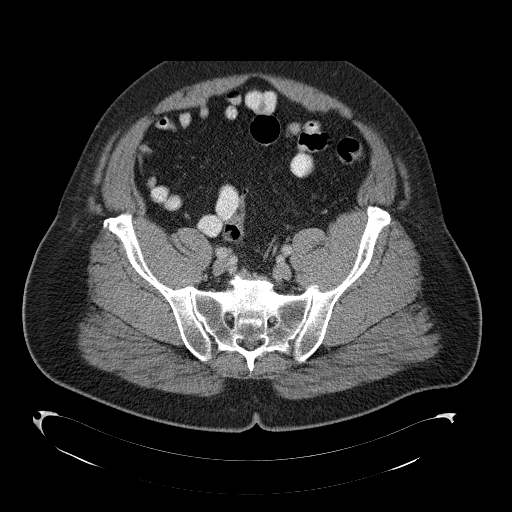
[im 36/96  soft-tissue]
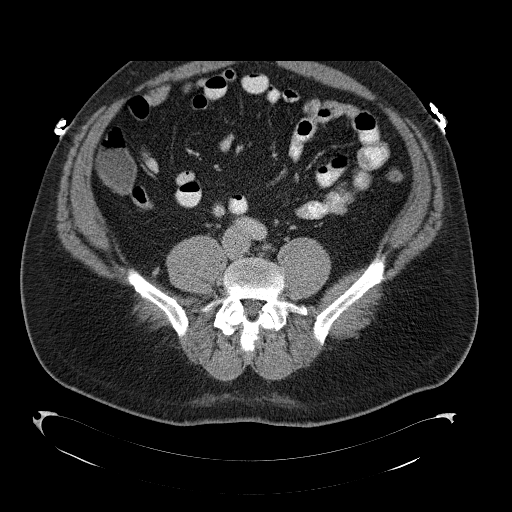
[im 41/96  soft-tissue]
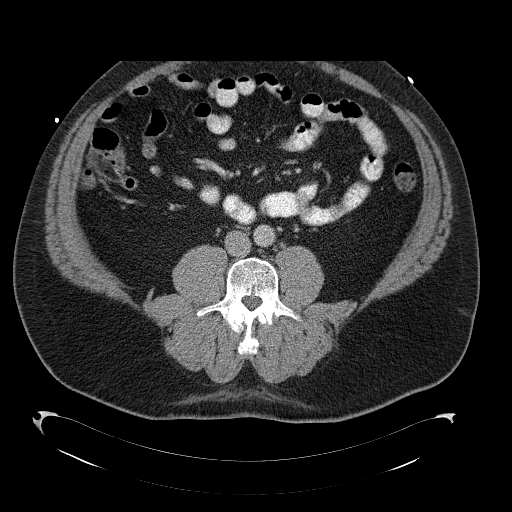
[im 51/96  soft-tissue]
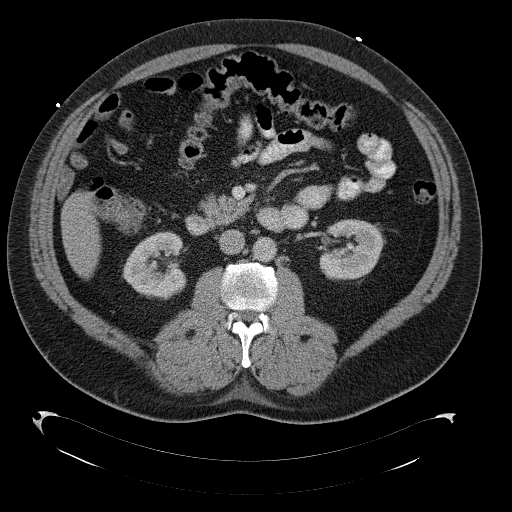
[im 56/96  soft-tissue]
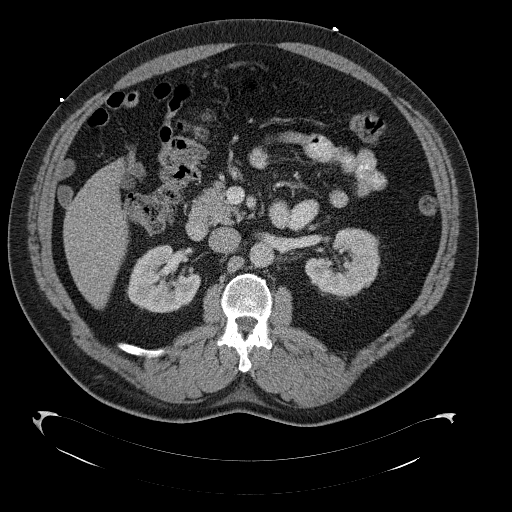
[im 61/96  soft-tissue]
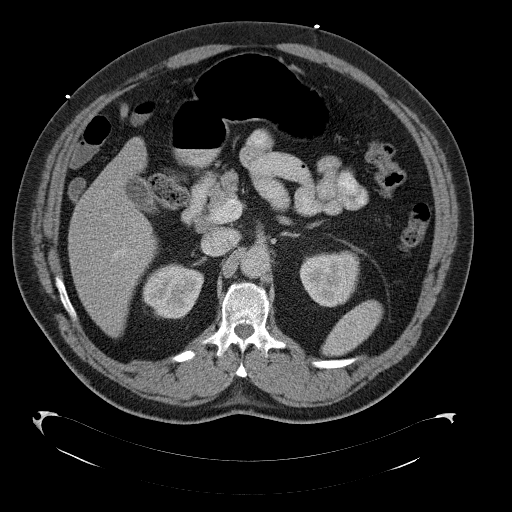
[im 61/96  bone]
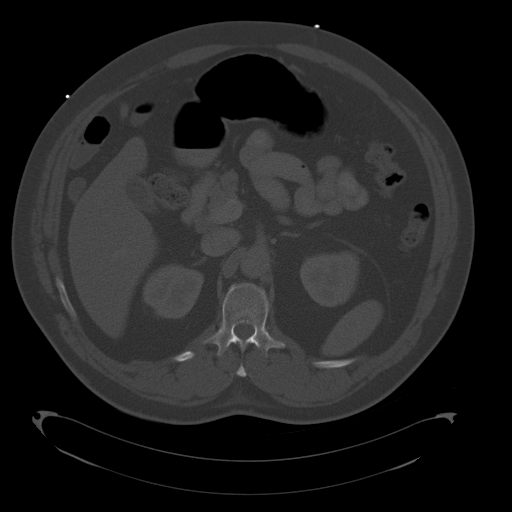
[im 71/96  soft-tissue]
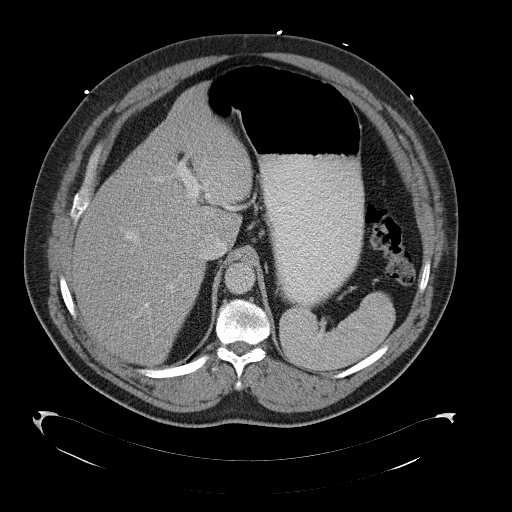
[im 76/96  soft-tissue]
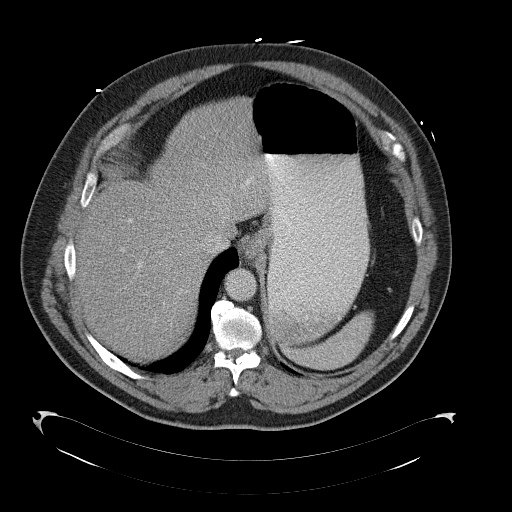
[im 81/96  soft-tissue]
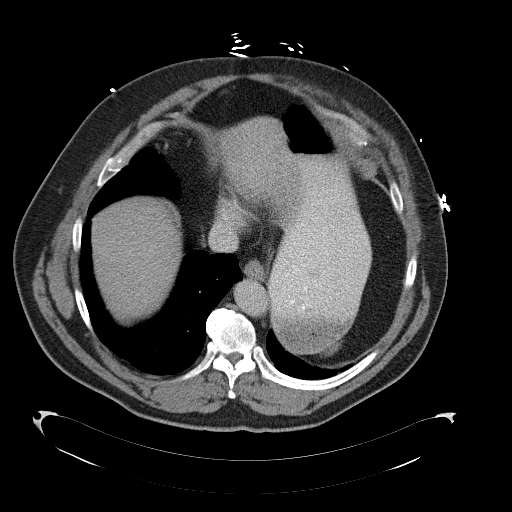
[im 91/96  soft-tissue]
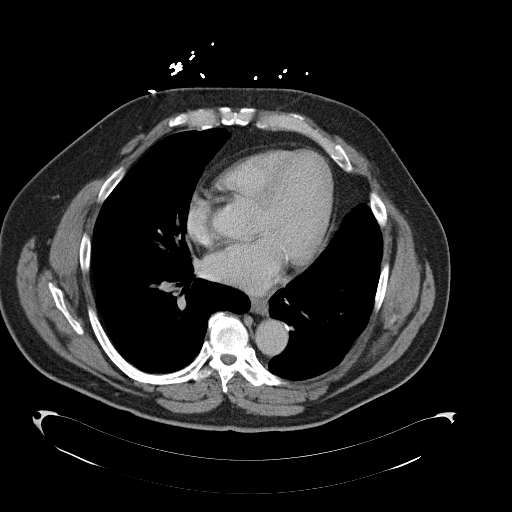

[Series 4: coronal · coronal · 0.83mm/px · 3 of 178 slices shown]
[im 60/178  soft-tissue]
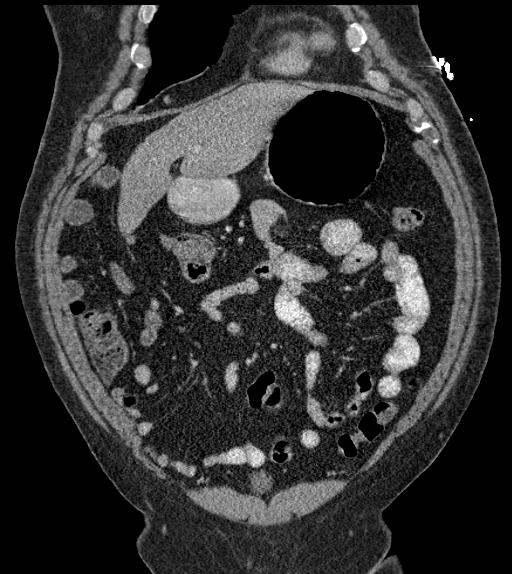
[im 79/178  soft-tissue]
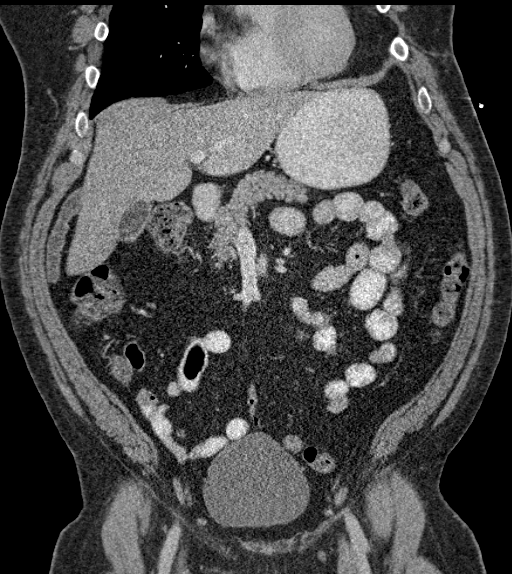
[im 99/178  soft-tissue]
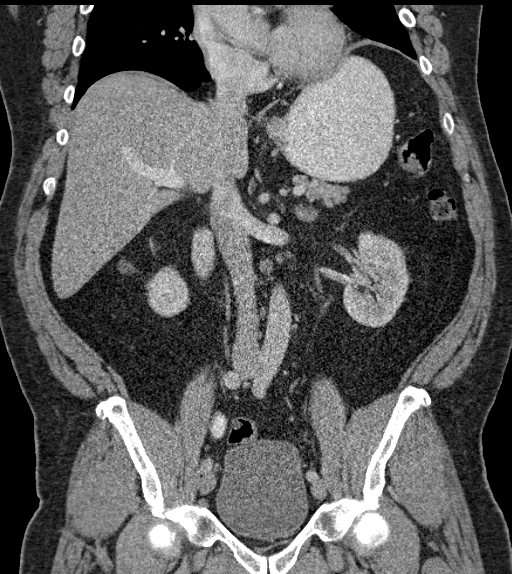

[16 of 46 positions shown; findings below may reference images not displayed]

FINDINGS: Lower chest:  No acute findings.

Hepatobiliary: Liver and gallbladder appear normal.

Pancreas: No mass, inflammatory changes, or other significant
abnormality.

Spleen: Within normal limits in size and appearance.

Adrenals/Urinary Tract: Adrenal glands appear normal. Kidneys appear
normal without mass, stone or hydronephrosis. No ureteral or bladder
calculi identified. Bladder appears normal.

Stomach/Bowel: Bowel is normal in caliber. No bowel wall thickening
or evidence of bowel wall inflammation. Status post appendectomy.
Stomach appears normal, mildly distended with air and recently
ingested material.

Vascular/Lymphatic: No pathologically enlarged lymph nodes. No
evidence of abdominal aortic aneurysm.

Reproductive: Prostate gland mildly prominent in size causing slight
mass effect on the bladder base. Otherwise unremarkable.

Other: No free fluid or abscess collection seen. No free
intraperitoneal air.

Musculoskeletal: Mild degenerative change within the lumbar spine.
No acute or suspicious osseous finding. Chronic bilateral pars
interarticularis defects at L5 with associated minimal (grade 1)
spondylolisthesis.
IMPRESSION: 1. No acute findings in the abdomen or pelvis. No bowel obstruction
or evidence of bowel wall inflammation. No soft tissue mass or free
fluid. No evidence of acute solid organ abnormality. No free
intraperitoneal air.
2. Additional chronic/incidental findings detailed above.

## 2017-10-01 ENCOUNTER — Other Ambulatory Visit (HOSPITAL_COMMUNITY): Payer: Self-pay | Admitting: Physician Assistant

## 2017-10-01 DIAGNOSIS — M79605 Pain in left leg: Secondary | ICD-10-CM

## 2017-10-04 ENCOUNTER — Ambulatory Visit (HOSPITAL_COMMUNITY): Payer: Self-pay | Attending: Physician Assistant

## 2017-10-04 ENCOUNTER — Encounter (HOSPITAL_COMMUNITY): Payer: Self-pay

## 2017-10-07 ENCOUNTER — Encounter (HOSPITAL_COMMUNITY): Payer: Self-pay | Admitting: Emergency Medicine

## 2017-10-07 ENCOUNTER — Emergency Department (HOSPITAL_COMMUNITY)
Admission: EM | Admit: 2017-10-07 | Discharge: 2017-10-07 | Disposition: A | Discharge status: Home / Self Care | Payer: Self-pay | Attending: Emergency Medicine | Admitting: Emergency Medicine

## 2017-10-07 ENCOUNTER — Other Ambulatory Visit: Payer: Self-pay

## 2017-10-07 DIAGNOSIS — Z5321 Procedure and treatment not carried out due to patient leaving prior to being seen by health care provider: Secondary | ICD-10-CM | POA: Insufficient documentation

## 2017-10-07 DIAGNOSIS — R2242 Localized swelling, mass and lump, left lower limb: Secondary | ICD-10-CM | POA: Insufficient documentation

## 2017-10-07 NOTE — ED Notes (Signed)
Patient called for room x1. No answer.

## 2017-10-07 NOTE — ED Triage Notes (Signed)
Patient c/o swelling to left lower leg. Patient states swelling "for a while" that increased over last night. Per patient started with swelling in foot,now swelling in calf and behind knee. 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 Patient seen by PCP and was supposed to have venous doppler on Thursday but work would not allow him to leave. Patient denies taking any anticoagulants at this time. Pain with walking.

## 2018-07-30 ENCOUNTER — Other Ambulatory Visit: Payer: Self-pay

## 2018-07-30 ENCOUNTER — Encounter (HOSPITAL_COMMUNITY): Payer: Self-pay | Admitting: Emergency Medicine

## 2018-07-30 ENCOUNTER — Emergency Department (HOSPITAL_COMMUNITY): Payer: Medicaid Other

## 2018-07-30 ENCOUNTER — Emergency Department (HOSPITAL_COMMUNITY)
Admission: EM | Admit: 2018-07-30 | Discharge: 2018-07-30 | Disposition: A | Discharge status: Home / Self Care | Payer: Medicaid Other | Attending: Emergency Medicine | Admitting: Emergency Medicine

## 2018-07-30 DIAGNOSIS — Z79899 Other long term (current) drug therapy: Secondary | ICD-10-CM | POA: Insufficient documentation

## 2018-07-30 DIAGNOSIS — E876 Hypokalemia: Secondary | ICD-10-CM

## 2018-07-30 DIAGNOSIS — R1084 Generalized abdominal pain: Secondary | ICD-10-CM | POA: Insufficient documentation

## 2018-07-30 DIAGNOSIS — I10 Essential (primary) hypertension: Secondary | ICD-10-CM | POA: Insufficient documentation

## 2018-07-30 DIAGNOSIS — E039 Hypothyroidism, unspecified: Secondary | ICD-10-CM | POA: Diagnosis not present

## 2018-07-30 DIAGNOSIS — J449 Chronic obstructive pulmonary disease, unspecified: Secondary | ICD-10-CM | POA: Insufficient documentation

## 2018-07-30 DIAGNOSIS — Z87891 Personal history of nicotine dependence: Secondary | ICD-10-CM | POA: Diagnosis not present

## 2018-07-30 DIAGNOSIS — K610 Anal abscess: Secondary | ICD-10-CM | POA: Diagnosis not present

## 2018-07-30 DIAGNOSIS — R109 Unspecified abdominal pain: Secondary | ICD-10-CM | POA: Diagnosis present

## 2018-07-30 LAB — COMPREHENSIVE METABOLIC PANEL
ALT: 83 U/L — ABNORMAL HIGH (ref 0–44)
AST: 59 U/L — ABNORMAL HIGH (ref 15–41)
Albumin: 3.9 g/dL (ref 3.5–5.0)
Alkaline Phosphatase: 84 U/L (ref 38–126)
Anion gap: 11 (ref 5–15)
BUN: 11 mg/dL (ref 6–20)
CO2: 30 mmol/L (ref 22–32)
Calcium: 9 mg/dL (ref 8.9–10.3)
Chloride: 95 mmol/L — ABNORMAL LOW (ref 98–111)
Creatinine, Ser: 1.07 mg/dL (ref 0.61–1.24)
GFR calc Af Amer: >60 mL/min (ref >60)
GFR calc non Af Amer: >60 mL/min (ref >60)
Glucose, Bld: 121 mg/dL — ABNORMAL HIGH (ref 70–99)
Potassium: 2.8 mmol/L — ABNORMAL LOW (ref 3.5–5.1)
Sodium: 136 mmol/L (ref 135–145)
Total Bilirubin: 0.8 mg/dL (ref 0.3–1.2)
Total Protein: 8 g/dL (ref 6.5–8.1)

## 2018-07-30 LAB — CBC WITH DIFFERENTIAL/PLATELET
Abs Immature Granulocytes: 0.08 10*3/uL — ABNORMAL HIGH (ref 0.00–0.07)
Basophils Absolute: 0 10*3/uL (ref 0.0–0.1)
Basophils Relative: 0 %
Eosinophils Absolute: 0.5 10*3/uL (ref 0.0–0.5)
Eosinophils Relative: 5 %
HCT: 46.1 % (ref 39.0–52.0)
Hemoglobin: 16 g/dL (ref 13.0–17.0)
Immature Granulocytes: 1 %
Lymphocytes Relative: 17 %
Lymphs Abs: 1.6 10*3/uL (ref 0.7–4.0)
MCH: 32.7 pg (ref 26.0–34.0)
MCHC: 34.7 g/dL (ref 30.0–36.0)
MCV: 94.3 fL (ref 80.0–100.0)
Monocytes Absolute: 0.8 10*3/uL (ref 0.1–1.0)
Monocytes Relative: 8 %
Neutro Abs: 6.2 10*3/uL (ref 1.7–7.7)
Neutrophils Relative %: 69 %
Platelets: 240 10*3/uL (ref 150–400)
RBC: 4.89 MIL/uL (ref 4.22–5.81)
RDW: 14.1 % (ref 11.5–15.5)
WBC: 9.1 10*3/uL (ref 4.0–10.5)
nRBC: 0 % (ref 0.0–0.2)

## 2018-07-30 LAB — LIPASE, BLOOD: Lipase: 29 U/L (ref 11–51)

## 2018-07-30 LAB — GROUP A STREP BY PCR: Group A Strep by PCR: NOT DETECTED

## 2018-07-30 MED ORDER — POTASSIUM CHLORIDE 10 MEQ/100ML IV SOLN
10.0000 meq | Freq: Once | INTRAVENOUS | Status: AC
  Administered 2018-07-30: 10 meq via INTRAVENOUS
  Filled 2018-07-30: qty 100

## 2018-07-30 MED ORDER — AMOXICILLIN-POT CLAVULANATE 875-125 MG PO TABS: 1.0000 | ORAL_TABLET | Freq: Two times a day (BID) | ORAL | 0 refills | Status: DC

## 2018-07-30 MED ORDER — IOHEXOL 300 MG/ML  SOLN
100.0000 mL | Freq: Once | INTRAMUSCULAR | Status: AC | PRN
  Administered 2018-07-30: 100 mL via INTRAVENOUS

## 2018-07-30 NOTE — ED Triage Notes (Signed)
Pt c/o of abd pain when he coughs. Also c/o of sore throat and sinus drainage. Pt states that he has had a rectal fistula in the past and is worried that it had happened again.

## 2018-07-30 NOTE — ED Provider Notes (Signed)
[facility] EMERGENCY DEPARTMENT Provider Note   CSN: [id number] Arrival date & time: 07/30/18  0501    History   Chief Complaint Chief Complaint  Patient presents with   Abdominal Pain    HPI Fernando [name] is a 58 y.o. male.     HPI Patient with abdominal pain.  Has had over the last couple months but states he just got his insurance today so he came in.  Worse with eating.  Diffuse.  States he had a severe perianal abscess in 1997.  He has had issues with that since.  States he has to keep the tissue in his buttock area to keep collecting the yellow dark fluid that comes out.  No fevers.  States he feels if he is eating more than is coming out rectally.  Pain is dull.  Is not necessarily having pain with bowel movements.  Also has a sore throat.  Thinks he has strep throat.  States has had a lots of times.  Throat is worse with swallowing.  Also occasional cough.  Mild sputum production at times. Past Medical History:  Diagnosis Date   Acute respiratory failure with hypoxia ([place]) 08/27/2015   Mild-Oxygen saturation 88% with ambulation.   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   Peri-rectal abscess    multiple.    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   Pedal edema 08/26/2015   Hypokalemia 08/26/2015   Chronic diarrhea 08/26/2015   Essential hypertension 08/26/2015   Pain in the chest    HCAP (healthcare-associated pneumonia) 05/12/2015   Nodule of left lung 05/03/2015   Lung mass 03/30/2015   Peri-rectal abscess 03/07/2015   Abnormal CXR 03/07/2015   Scrotal wall abscess 05/01/2013   Cellulitis 05/01/2013   Hematuria 04/30/2013   Cellulitis of scrotum 04/29/2013   Tick bite 04/29/2013   Hypertension 12/30/2011    Past Surgical History:  Procedure Laterality Date   APPENDECTOMY     CERVICAL FUSION     COLONOSCOPY     CYSTOSCOPY N/A 05/01/2013   Procedure: CYSTOSCOPY;  Surgeon: Marissa Nestle, MD;  Location: [facility];  Service: Urology;  Laterality: N/A;   EVALUATION UNDER ANESTHESIA WITH FISTULECTOMY N/A 06/23/2015   Procedure: ANAL EXAM UNDER ANESTHESIA  FISTULOTOMY;  Surgeon: Leighton Ruff, MD;  Location: [place];  Service: General;  Laterality: N/A;   FACIAL COSMETIC SURGERY     HERNIA REPAIR     INCISION AND DRAINAGE ABSCESS Right 05/01/2013  Procedure: INCISION AND DRAINAGE RIGHT SCROTAL ABSCESS;  Surgeon: Marissa Nestle, MD;  Location: [facility];  Service: Urology;  Laterality: Right;   INCISION AND DRAINAGE PERIRECTAL ABSCESS     KNEE ARTHROSCOPY     SPINE SURGERY     cervical   VIDEO ASSISTED THORACOSCOPY (VATS)/ LOBECTOMY Left 05/03/2015   Procedure: LEFT VIDEO ASSISTED THORACOSCOPY (VATS)/ LEFT LOWER LOBECTOMY, ON-Q CATHETER PLACEMENT;  Surgeon: Melrose Nakayama, MD;  Location: [place];  Service: Thoracic;  Laterality: Left;   VIDEO BRONCHOSCOPY WITH ENDOBRONCHIAL ULTRASOUND N/A 04/22/2015   Procedure: VIDEO BRONCHOSCOPY WITH ENDOBRONCHIAL ULTRASOUND;  Surgeon: Melrose Nakayama, MD;  Location:  [place];  Service: Thoracic;  Laterality: N/A;        Home Medications    Prior to Admission medications   Medication Sig Start Date End Date Taking? Authorizing Provider  albuterol (PROVENTIL HFA;VENTOLIN HFA) 108 (90 Base) MCG/ACT inhaler Inhale 2 puffs into the lungs every 6 (six) hours as needed for wheezing or shortness of breath. 03/12/15   Rai, Vernelle Emerald, MD  ALPRAZolam Duanne Moron) 1 MG tablet Take 1 tablet (1 mg total) by mouth 3 (three) times daily as needed for anxiety. 03/12/15   Rai, Vernelle Emerald, MD  amoxicillin-clavulanate (AUGMENTIN) 875-125 MG tablet Take 1 tablet by mouth every 12 (twelve) hours. 07/30/18   Davonna Belling, MD  enalapril (VASOTEC) 10 MG tablet Take 10 mg by mouth daily.    [provider]  famotidine (PEPCID) 20 MG tablet Take 1 tablet (20 mg total) by mouth 2 (two) times daily. 05/23/17   Hayden Rasmussen, MD  sucralfate (CARAFATE) 1 GM/10ML suspension Take 10 mLs (1 g total) by mouth 4 (four) times daily -  with meals and at bedtime. 05/23/17   Hayden Rasmussen, MD    Family History Family History  Problem Relation Age of Onset   Breast cancer Mother    Thyroid disease Mother     Social History Social History   Tobacco Use   Smoking status: Former Smoker    Packs/day: 0.50    Years: 15.00    Pack years: 7.50    Types: Cigarettes    Quit date: 03/05/2015    Years since quitting: 3.4   Smokeless tobacco: Never Used  Substance Use Topics   Alcohol use: Not Currently    Alcohol/week: 0.0 standard drinks   Drug use: No     Allergies   Morphine and related   Review of Systems Review of Systems  Constitutional: Positive for appetite change.  Respiratory: Positive for cough. Negative for shortness of breath.   Gastrointestinal: Positive for abdominal pain and constipation. Negative for nausea and rectal pain.  Genitourinary: Negative for flank pain.  Musculoskeletal: Negative for back pain.  Skin: Negative for rash.    Neurological: Negative for weakness.     Physical Exam Updated Vital Signs BP (!) 128/100 (BP Location: Left Arm)    Pulse 95    Temp 97.9 F (36.6 C) (Oral)    Resp (!) 22    Ht 6\' 1"  (1.854 m)    Wt 124.7 kg    SpO2 100%    BMI 36.28 kg/m   Physical Exam Vitals signs and nursing note reviewed.  HENT:     Head: Normocephalic.     Comments: Bilateral tonsillar swelling.    Mouth/Throat:     Pharynx: No oropharyngeal exudate.  Cardiovascular:     Rate and Rhythm: Normal rate.  Pulmonary:  Breath sounds: No wheezing or rhonchi.  Abdominal:     Hernia: No hernia is present.     Comments: Mild diffuse tenderness.  No hernia.  No rebound or guarding.  Genitourinary:    Comments: Pain diffusely with rectal exam but particularly posteriorly.  No perianal fistula seen. Skin:    General: Skin is warm.     Capillary Refill: Capillary refill takes less than 2 seconds.  Neurological:     Mental Status: He is alert and oriented to person, place, and time.      ED Treatments / Results  Labs (all labs ordered are listed, but only abnormal results are displayed) Labs Reviewed  CBC WITH DIFFERENTIAL/PLATELET - Abnormal; Notable for the following components:      Result Value   Abs Immature Granulocytes 0.08 (*)    All other components within normal limits  COMPREHENSIVE METABOLIC PANEL - Abnormal; Notable for the following components:   Potassium 2.8 (*)    Chloride 95 (*)    Glucose, Bld 121 (*)    AST 59 (*)    ALT 83 (*)    All other components within normal limits  GROUP A STREP BY PCR  LIPASE, BLOOD  URINALYSIS, ROUTINE W REFLEX MICROSCOPIC    EKG None  Radiology Ct Abdomen Pelvis W Contrast  Result Date: 07/30/2018 CLINICAL DATA:  Abdominal pain, history of rectal fistula EXAM: CT ABDOMEN AND PELVIS WITH CONTRAST TECHNIQUE: Multidetector CT imaging of the abdomen and pelvis was performed using the standard protocol following bolus administration of intravenous  contrast. CONTRAST:  139mL OMNIPAQUE IOHEXOL 300 MG/ML  SOLN COMPARISON:  05/23/2017 FINDINGS: Lower chest: No acute abnormality. Elevation of the left hemidiaphragm with scarring or atelectasis. Hepatobiliary: No solid liver abnormality is seen. No gallstones, gallbladder wall thickening, or biliary dilatation. Pancreas: Unremarkable. No pancreatic ductal dilatation or surrounding inflammatory changes. Spleen: Normal in size without significant abnormality. Adrenals/Urinary Tract: Adrenal glands are unremarkable. Kidneys are normal, without renal calculi, solid lesion, or hydronephrosis. Bladder is unremarkable. Stomach/Bowel: Stomach is within normal limits. Appendix appears normal. No evidence of bowel wall thickening, distention, or inflammatory changes. There is a small fluid collection posterior to the low rectum (6 o'clock in lithotomy position), which is decreased in size compared to prior examination, measuring approximately 2.0 cm (series 2, image 88, series 7, image 81). Vascular/Lymphatic: No significant vascular findings are present. Prominent bilateral inguinal lymph nodes, unchanged from prior. Reproductive: Mild prostatomegaly. Other: Small, fat containing left inguinal hernia. No abdominopelvic ascites. Musculoskeletal: No acute or significant osseous findings. Bilateral pars defects of L5. IMPRESSION: 1. There is a small fluid collection posterior to the low rectum (6 o'clock in lithotomy position), which is decreased in size compared to prior examination, measuring approximately 2.0 cm (series 2, image 88, series 7, image 81). This may reflect recurrent or residual perirectal abscess. 2. No other acute findings of the abdomen or pelvis to explain pain. 3.  Chronic and incidental findings as detailed above. Electronically Signed   By: Eddie Candle M.D.   On: 07/30/2018 09:02   Dg Chest Port 1 View  Result Date: 07/30/2018 CLINICAL DATA:  Cough. EXAM: PORTABLE CHEST 1 VIEW COMPARISON:   03/12/2017 FINDINGS: Mediastinum and hilar structures normal. Heart size normal. Mild left base infiltrate. No prominent pleural effusion. No pneumothorax. Cervicothoracic spine fusion. IMPRESSION: Mild infiltrate left lung base. Followup PA and lateral chest X-ray is recommended in 3-4 weeks following trial of antibiotic therapy to ensure resolution and exclude underlying malignancy.  Electronically Signed   By: Marcello Moores  Register   On: 07/30/2018 06:43    Procedures Procedures (including critical care time)  Medications Ordered in ED Medications  iohexol (OMNIPAQUE) 300 MG/ML solution 100 mL (100 mLs Intravenous Contrast Given 07/30/18 0805)  potassium chloride 10 mEq in 100 mL IVPB (0 mEq Intravenous Stopped 07/30/18 1135)     Initial Impression / Assessment and Plan / ED Course  I have reviewed the triage vital signs and the nursing notes.  Pertinent labs & imaging results that were available during my care of the patient were reviewed by me and considered in my medical decision making (see chart for details).        Patient with abdominal pain.  Previous rectal abscess.  CT scan showed likely rectal abscess versus fluid collection.  It is smaller than it was a few months ago but was not there 3 years ago.  Likely draining and is tender.  White count reassuring.  Discussed with Dr. Arnoldo Morale.  We will follow-up as an outpatient.  We think this is okay for outpatient management with antibiotics.  Abscess is smaller than recently and likely draining on its own.  Will discharge.  Final Clinical Impressions(s) / ED Diagnoses   Final diagnoses:  Perianal abscess  Hypokalemia  Generalized abdominal pain    ED Discharge Orders         Ordered    amoxicillin-clavulanate (AUGMENTIN) 875-125 MG tablet  Every 12 hours     07/30/18 1212           Davonna Belling, MD 07/30/18 1257

## 2018-07-30 NOTE — Discharge Instructions (Addendum)
Take your potassium pills at home.  Take the antibiotics.  Follow-up with Dr. Arnoldo Morale in 1 to 2 weeks.

## 2018-08-22 ENCOUNTER — Encounter: Payer: Self-pay | Admitting: General Surgery

## 2018-08-22 ENCOUNTER — Ambulatory Visit (INDEPENDENT_AMBULATORY_CARE_PROVIDER_SITE_OTHER): Payer: Self-pay | Admitting: General Surgery

## 2018-08-22 ENCOUNTER — Other Ambulatory Visit: Payer: Self-pay

## 2018-08-22 VITALS — BP 153/90 | HR 92 | Temp 98.2°F | Resp 18 | Ht 74.0 in | Wt 284.0 lb

## 2018-08-22 DIAGNOSIS — K611 Rectal abscess: Secondary | ICD-10-CM

## 2018-08-22 NOTE — Patient Instructions (Signed)
Proctalgia Fugax  Proctalgia fugax is a condition that involves short episodes of intense pain in the rectum. The rectum is the last part of the large intestine. The pain can last from seconds to minutes. Episodes often occur during the night and wake the person from sleep. This condition is not a sign of cancer, but your health care provider may want to rule out other conditions. What are the causes? The exact cause of this condition is not known. One possible cause may be spasm of the pelvic muscles or spasms of the lowest part of the large intestine. What are the signs or symptoms? The only symptom of this condition is rectal pain. The pain may:  Be intense or severe.  Last for only a few seconds, or it may last up to 30 minutes.  Occur at night and wake you up from sleep. How is this diagnosed? This condition may be diagnosed based on your medical history, a physical exam, and by ruling out other problems that could cause the pain. You may have various tests, such as:  Anoscopy. In this test, a lighted scope is put into the rectum to look for abnormalities.  Barium enema. In this test, X-rays are taken after a white, chalky substance called barium is put into the colon. The barium shows up well on the X-rays and makes it easier to see any problems.  Blood tests to rule out infections or other problems. How is this treated? There is no specific treatment for this condition. This condition may be managed with:  Medicines.  Warm baths.  Relaxation techniques, such as deep breathing exercises or gentle yoga.  Gentle massage of the painful area.  Biofeedback. Follow these instructions at home:   Take over-the-counter and prescription medicines only as told by your health care provider.  Follow instructions from your health care provider about eating or drinking restrictions.  Ask your health care provider what activities are safe for you.  Try warm baths, massaging the area,  or progressive relaxation techniques as told by your health care provider.  Keep all follow-up visits as told by your health care provider. This is important. Contact a health care provider if:  Your pain gets worse.  You develop new symptoms.  You have anal pain along with a fever. Get help right away if you:  Have blood in your stool or blood coming from the rectal area. Summary  Proctalgia fugax is a condition that involves short episodes of intense pain in the rectum. Episodes often occur during the night and wake the person from sleep.  The cause of this condition is not known.  Medicines, warm baths, massage, relaxation techniques, and biofeedback may help to manage the pain.  Get help right away if you have blood in your stool or blood coming from the rectal area. This information is not intended to replace advice given to you by your health care provider. Make sure you discuss any questions you have with your health care provider. Document Released: 09/20/2000 Document Revised: 08/08/2017 Document Reviewed: 08/08/2017 Elsevier Patient Education  2020 [employer].

## 2018-08-22 NOTE — Progress Notes (Signed)
Fernando [name]; [id number]; [birth year]   HPI Patient is a 58 year old white male who was referred to my care by the emergency room for evaluation treatment of a perirectal abscess.  Patient was seen in the emergency room on 07/30/2018 for rectal pain and was found to have an approximately 2 cm fluid-filled cavity which was smaller than a previously seen perirectal abscess.  He had no white blood cell count.  He was placed on antibiotics and referred to my care for further evaluation treatment.  Patient has had multiple anal surgeries over the years due to perirectal abscesses, fistulas, and fissures.  He was last seen by Dr. Ailene Ravel of [facility] in 2017.  He has had chronic rectal pain for many years and has been told that he may need a colostomy in the future as there is nothing more that can be done for his chronic rectal pain and fistula.  Today, he states he is continuing to have some purulent drainage from his rectum.  He states he chronically has intermittent episodes of purulent drainage from his rectum.  He is incontinent of stool and wears a pad chronically.  He denies any fever or chills.  His pain level is down to 4 out of 10 in the rectum. Past Medical History:  Diagnosis Date  . Acute respiratory failure with hypoxia ([facility]) 08/27/2015   Mild-Oxygen saturation 88% with ambulation.  . Allergy   . Anal fissure   . 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 Complication of anesthesia    woke up during colonoscopy  . HCAP (healthcare-associated pneumonia) 05/12/2015  . Hypertension 12/30/2011  . Hypothyroidism 08/27/2015   New diagnosis  . Loose stools   . Lung mass    hx of left lower benign mass  . Obstructive sleep apnea 08/27/2015   Per history only; not confirmed with a sleep study yet.  Marland Kitchen Peri-rectal abscess    multiple.   . Pneumonia   . Shortness of breath dyspnea    with exertion  . 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 Tobacco abuse   . Umbilical hernia    has been  repaired    Past Surgical History:  Procedure Laterality Date  . APPENDECTOMY    . CERVICAL FUSION    . COLONOSCOPY    . CYSTOSCOPY N/A 05/01/2013   Procedure: CYSTOSCOPY;  Surgeon: Marissa Nestle, MD;  Location: [facility];  Service: Urology;  Laterality: N/A;  . EVALUATION UNDER ANESTHESIA WITH FISTULECTOMY N/A 06/23/2015   Procedure: ANAL EXAM UNDER ANESTHESIA  FISTULOTOMY;  Surgeon: Leighton Ruff, MD;  Location: [place];  Service: General;  Laterality: N/A;  . FACIAL COSMETIC SURGERY    . HERNIA REPAIR    . INCISION AND DRAINAGE ABSCESS Right 05/01/2013   Procedure: INCISION AND DRAINAGE RIGHT SCROTAL ABSCESS;  Surgeon: Marissa Nestle, MD;  Location: [facility];  Service: Urology;  Laterality: Right;  . INCISION AND DRAINAGE PERIRECTAL ABSCESS    . KNEE ARTHROSCOPY    . SPINE SURGERY     cervical  . VIDEO ASSISTED THORACOSCOPY (VATS)/ LOBECTOMY Left 05/03/2015   Procedure: LEFT VIDEO ASSISTED THORACOSCOPY (VATS)/ LEFT LOWER LOBECTOMY, ON-Q CATHETER PLACEMENT;  Surgeon: Melrose Nakayama, MD;  Location: [place];  Service: Thoracic;  Laterality: Left;  Marland Kitchen VIDEO BRONCHOSCOPY WITH ENDOBRONCHIAL ULTRASOUND N/A 04/22/2015   Procedure: VIDEO BRONCHOSCOPY WITH ENDOBRONCHIAL ULTRASOUND;  Surgeon: Melrose Nakayama, MD;  Location: [place];  Service: Thoracic;  Laterality: N/A;  Family History  Problem Relation Age of Onset  . Breast cancer Mother   . Thyroid disease Mother     Current Outpatient Medications on File Prior to Visit  Medication Sig Dispense Refill  . albuterol (PROVENTIL HFA;VENTOLIN HFA) 108 (90 Base) MCG/ACT inhaler Inhale 2 puffs into the lungs every 6 (six) hours as needed for wheezing or shortness of breath. 1 Inhaler 12  . ALPRAZolam (XANAX) 1 MG tablet Take 1 tablet (1 mg total) by mouth 3 (three) times daily as needed for anxiety. 30 tablet 0  . enalapril (VASOTEC) 10 MG tablet Take 10 mg by mouth daily.    Marland Kitchen amoxicillin-clavulanate (AUGMENTIN)  875-125 MG tablet Take 1 tablet by mouth every 12 (twelve) hours. (Patient not taking: Reported on 08/22/2018) 20 tablet 0  . famotidine (PEPCID) 20 MG tablet Take 1 tablet (20 mg total) by mouth 2 (two) times daily. (Patient not taking: Reported on 08/22/2018) 30 tablet 0  . sucralfate (CARAFATE) 1 GM/10ML suspension Take 10 mLs (1 g total) by mouth 4 (four) times daily -  with meals and at bedtime. (Patient not taking: Reported on 08/22/2018) 420 mL 0   No current facility-administered medications on file prior to visit.     Allergies  Allergen Reactions  . Morphine And Related Other (See Comments)    hallucinations    Social History   Substance and Sexual Activity  Alcohol Use Not Currently  . Alcohol/week: 0.0 standard drinks    Social History   Tobacco Use  Smoking Status Former Smoker  . Packs/day: 0.50  . Years: 15.00  . Pack years: 7.50  . Types: Cigarettes  . Quit date: 03/05/2015  . Years since quitting: 3.4  Smokeless Tobacco Never Used    Review of Systems  Constitutional: Negative.   HENT: Positive for sinus pain.   Eyes: Negative.   Respiratory: Positive for shortness of breath.   Cardiovascular: Negative.   Gastrointestinal: Positive for abdominal pain.  Genitourinary: Negative.   Musculoskeletal: Negative.   Skin: Negative.   Neurological: Negative.   Endo/Heme/Allergies: Negative.   Psychiatric/Behavioral: Negative.     Objective   Vitals:   08/22/18 1033  BP: (!) 153/90  Pulse: 92  Resp: 18  Temp: 98.2 F (36.8 C)  SpO2: 95%    Physical Exam Vitals signs reviewed.  Constitutional:      Appearance: Normal appearance. He is not ill-appearing.  HENT:     Head: Normocephalic and atraumatic.  Cardiovascular:     Rate and Rhythm: Normal rate and regular rhythm.     Heart sounds: Normal heart sounds. No murmur. No friction rub. No gallop.   Pulmonary:     Effort: Pulmonary effort is normal. No respiratory distress.     Breath sounds: No  stridor. No wheezing, rhonchi or rales.     Comments: Slightly decreased left breath sounds compared to the right. Abdominal:     General: Bowel sounds are normal. There is no distension.     Palpations: Abdomen is soft. There is no mass.     Tenderness: There is no abdominal tenderness. There is no guarding or rebound.     Hernia: No hernia is present.  Genitourinary:    Comments: Rectal examination was limited secondary to pain.  No anal verge drainage or induration present.  Thin skin is noted circumferentially around the anus, though no erythema is noted. Skin:    General: Skin is warm and dry.  Neurological:     Mental Status:  He is alert and oriented to person, place, and time.    CT scan reviewed.  Previous colorectal surgical visits reviewed. Assessment  Chronic perirectal fistula, chronic proctalgia, resolving acute perirectal abscess Plan   No further surgical intervention is warranted at this time as the fluid collection is draining on its own.  I told the patient to follow-up with Dr. Ailene Ravel as he has been comfortable seeing her in the past.  He does realize that any surgical intervention around the rectum is very limited due to the chronicity.  Rectiv cream samples have been given.  Follow-up here as needed.

## 2018-09-07 ENCOUNTER — Encounter (HOSPITAL_COMMUNITY): Payer: Self-pay | Admitting: Emergency Medicine

## 2018-09-07 ENCOUNTER — Emergency Department (HOSPITAL_COMMUNITY)
Admission: EM | Admit: 2018-09-07 | Discharge: 2018-09-07 | Disposition: A | Discharge status: Home / Self Care | Payer: Medicaid Other | Attending: Neurology | Admitting: Neurology

## 2018-09-07 ENCOUNTER — Other Ambulatory Visit: Payer: Self-pay

## 2018-09-07 ENCOUNTER — Emergency Department (HOSPITAL_COMMUNITY): Payer: Medicaid Other

## 2018-09-07 DIAGNOSIS — Z87891 Personal history of nicotine dependence: Secondary | ICD-10-CM | POA: Insufficient documentation

## 2018-09-07 DIAGNOSIS — Z4801 Encounter for change or removal of surgical wound dressing: Secondary | ICD-10-CM | POA: Insufficient documentation

## 2018-09-07 DIAGNOSIS — I10 Essential (primary) hypertension: Secondary | ICD-10-CM | POA: Diagnosis not present

## 2018-09-07 DIAGNOSIS — J449 Chronic obstructive pulmonary disease, unspecified: Secondary | ICD-10-CM | POA: Insufficient documentation

## 2018-09-07 DIAGNOSIS — E039 Hypothyroidism, unspecified: Secondary | ICD-10-CM | POA: Diagnosis not present

## 2018-09-07 DIAGNOSIS — Z79899 Other long term (current) drug therapy: Secondary | ICD-10-CM | POA: Insufficient documentation

## 2018-09-07 DIAGNOSIS — K6289 Other specified diseases of anus and rectum: Secondary | ICD-10-CM | POA: Diagnosis not present

## 2018-09-07 DIAGNOSIS — Z5189 Encounter for other specified aftercare: Secondary | ICD-10-CM

## 2018-09-07 LAB — BASIC METABOLIC PANEL
Anion gap: 10 (ref 5–15)
BUN: 11 mg/dL (ref 6–20)
CO2: 31 mmol/L (ref 22–32)
Calcium: 9 mg/dL (ref 8.9–10.3)
Chloride: 93 mmol/L — ABNORMAL LOW (ref 98–111)
Creatinine, Ser: 1.28 mg/dL — ABNORMAL HIGH (ref 0.61–1.24)
GFR calc Af Amer: >60 mL/min (ref >60)
GFR calc non Af Amer: >60 mL/min (ref >60)
Glucose, Bld: 107 mg/dL — ABNORMAL HIGH (ref 70–99)
Potassium: 3.9 mmol/L (ref 3.5–5.1)
Sodium: 134 mmol/L — ABNORMAL LOW (ref 135–145)

## 2018-09-07 LAB — CBC WITH DIFFERENTIAL/PLATELET
Abs Immature Granulocytes: 0.03 10*3/uL (ref 0.00–0.07)
Basophils Absolute: 0 10*3/uL (ref 0.0–0.1)
Basophils Relative: 1 %
Eosinophils Absolute: 0.3 10*3/uL (ref 0.0–0.5)
Eosinophils Relative: 4 %
HCT: 45 % (ref 39.0–52.0)
Hemoglobin: 15.1 g/dL (ref 13.0–17.0)
Immature Granulocytes: 0 %
Lymphocytes Relative: 23 %
Lymphs Abs: 1.6 10*3/uL (ref 0.7–4.0)
MCH: 31.8 pg (ref 26.0–34.0)
MCHC: 33.6 g/dL (ref 30.0–36.0)
MCV: 94.7 fL (ref 80.0–100.0)
Monocytes Absolute: 0.6 10*3/uL (ref 0.1–1.0)
Monocytes Relative: 9 %
Neutro Abs: 4.2 10*3/uL (ref 1.7–7.7)
Neutrophils Relative %: 63 %
Platelets: 245 10*3/uL (ref 150–400)
RBC: 4.75 MIL/uL (ref 4.22–5.81)
RDW: 12.9 % (ref 11.5–15.5)
WBC: 6.8 10*3/uL (ref 4.0–10.5)
nRBC: 0 % (ref 0.0–0.2)

## 2018-09-07 MED ORDER — HYDROMORPHONE HCL 1 MG/ML IJ SOLN
1.0000 mg | Freq: Once | INTRAMUSCULAR | Status: AC
  Administered 2018-09-07: 1 mg via INTRAVENOUS
  Filled 2018-09-07: qty 1

## 2018-09-07 MED ORDER — ONDANSETRON HCL 4 MG/2ML IJ SOLN
4.0000 mg | Freq: Once | INTRAMUSCULAR | Status: AC
  Administered 2018-09-07: 4 mg via INTRAVENOUS
  Filled 2018-09-07: qty 2

## 2018-09-07 MED ORDER — IOHEXOL 300 MG/ML  SOLN
100.0000 mL | Freq: Once | INTRAMUSCULAR | Status: AC | PRN
  Administered 2018-09-07: 12:00:00 100 mL via INTRAVENOUS

## 2018-09-07 NOTE — ED Notes (Signed)
Extensive education done with pt and his mother regarding dressing changes and proper packing placement. Mother was actually unsure of where she was supposed to be packing and appears to have been placing packing into rectum instead of wound. Educated on this and return demonstration.

## 2018-09-07 NOTE — ED Triage Notes (Signed)
Patient c/o increased swelling and pain to surgical wound. Per patient had surgery on abscess above rectum x2 weeks ago at [facility] of any fevers. Patient reports penrose drain. Wife tried to pack wound with gauze this morning but swelling was to significant.

## 2018-09-07 NOTE — ED Provider Notes (Signed)
[facility] EMERGENCY DEPARTMENT Provider Note   CSN: [id number] Arrival date & time: 09/07/18  [id number]     History   Chief Complaint Chief Complaint  Patient presents with  . Wound Check    HPI Fernando [name] is a 58 y.o. male with  past medical history as listed below presents to emergency department with increased pain and swelling around surgical wound. Patient is s/p EUA with I&D of perirectal abscess, seton drain placement on 08/26/2018 by Dr. Morton Stall.  Last night he woke up with worsening pain and a feeling of pressure in his rectum.  When he attempted to have his mother change the packing this a.m. the pain was so severe he could not tolerate it.  States the pain is sharp.  Pain is localized to his wound.  He rates it 9 out of 10 in severity.  He has been taking oxycodone at home without symptom relief.  He is not taking antibiotics currently. His last bowel movement was 2 days ago and was soft as he is taking a stool softener.  Patient denies fever, chills, chest pain, shortness of breath, abdominal pain, nausea, vomiting, blood in stool.  He reports his next visit with Dr. Morton Stall is scheduled for 10/03/2018.     Past Medical History:  Diagnosis Date  . Acute respiratory failure with hypoxia (HCC) 08/27/2015   Mild-Oxygen saturation 88% with ambulation.  . Allergy   . Anal fissure   . 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 Complication of anesthesia    woke up during colonoscopy  . HCAP (healthcare-associated pneumonia) 05/12/2015  . Hypertension 12/30/2011  . Hypothyroidism 08/27/2015   New diagnosis  . Loose stools   . Lung mass    hx of left lower benign mass  . Obstructive sleep apnea 08/27/2015   Per history only; not confirmed with a sleep study yet.  Marland Kitchen Peri-rectal abscess    multiple.   . Pneumonia   . Shortness of breath dyspnea    with exertion  . 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 Tobacco abuse   . Umbilical hernia    has been repaired    Patient Active  Problem List   Diagnosis Date Noted  . Costochondral chest pain 10/12/2016  . Hypothyroidism 08/27/2015  . Acute respiratory failure with hypoxia ([place]) 08/27/2015  . COPD (chronic obstructive pulmonary disease) ([place]) 08/27/2015  . Obstructive sleep apnea 08/27/2015  . Dyspnea on exertion 08/26/2015  . Left sided chest pain 08/26/2015  . Obesity 08/26/2015  . Anal fistula 08/26/2015  . Pedal edema 08/26/2015  . Hypokalemia 08/26/2015  . Chronic diarrhea 08/26/2015  . Essential hypertension 08/26/2015  . Pain in the chest   . HCAP (healthcare-associated pneumonia) 05/12/2015  . Nodule of left lung 05/03/2015  . Lung mass 03/30/2015  . Peri-rectal abscess 03/07/2015  . Abnormal CXR 03/07/2015  . Scrotal wall abscess 05/01/2013  . Cellulitis 05/01/2013  . Hematuria 04/30/2013  . Cellulitis of scrotum 04/29/2013  . Tick bite 04/29/2013  . Hypertension 12/30/2011    Past Surgical History:  Procedure Laterality Date  . ABSCESS DRAINAGE    . APPENDECTOMY    . CERVICAL FUSION    . COLONOSCOPY    . CYSTOSCOPY N/A 05/01/2013   Procedure: CYSTOSCOPY;  Surgeon: Marissa Nestle, MD;  Location: [facility];  Service: Urology;  Laterality: N/A;  . EVALUATION UNDER ANESTHESIA WITH FISTULECTOMY N/A 06/23/2015   Procedure: ANAL EXAM UNDER ANESTHESIA  FISTULOTOMY;  Surgeon: Elmo Putt  Marcello Moores, MD;  Location: [facility];  Service: General;  Laterality: N/A;  . FACIAL COSMETIC SURGERY    . HERNIA REPAIR    . INCISION AND DRAINAGE ABSCESS Right 05/01/2013   Procedure: INCISION AND DRAINAGE RIGHT SCROTAL ABSCESS;  Surgeon: Marissa Nestle, MD;  Location: [facility];  Service: Urology;  Laterality: Right;  . INCISION AND DRAINAGE PERIRECTAL ABSCESS    . KNEE ARTHROSCOPY    . SPINE SURGERY     cervical  . VIDEO ASSISTED THORACOSCOPY (VATS)/ LOBECTOMY Left 05/03/2015   Procedure: LEFT VIDEO ASSISTED THORACOSCOPY (VATS)/ LEFT LOWER LOBECTOMY, ON-Q CATHETER PLACEMENT;  Surgeon: Melrose Nakayama, MD;  Location: [facility];  Service: Thoracic;  Laterality: Left;  Marland Kitchen VIDEO BRONCHOSCOPY WITH ENDOBRONCHIAL ULTRASOUND N/A 04/22/2015   Procedure: VIDEO BRONCHOSCOPY WITH ENDOBRONCHIAL ULTRASOUND;  Surgeon: Melrose Nakayama, MD;  Location: [place];  Service: Thoracic;  Laterality: N/A;        Home Medications    Prior to Admission medications   Medication Sig Start Date End Date Taking? Authorizing Provider  albuterol (PROVENTIL HFA;VENTOLIN HFA) 108 (90 Base) MCG/ACT inhaler Inhale 2 puffs into the lungs every 6 (six) hours as needed for wheezing or shortness of breath. Patient not taking: Reported on 09/07/2018 03/12/15   Rai, Vernelle Emerald, MD  ALPRAZolam Duanne Moron) 1 MG tablet Take 1 tablet (1 mg total) by mouth 3 (three) times daily as needed for anxiety. Patient not taking: Reported on 09/07/2018 03/12/15   Mendel Corning, MD  amoxicillin-clavulanate (AUGMENTIN) 875-125 MG tablet Take 1 tablet by mouth every 12 (twelve) hours. Patient not taking: Reported on 08/22/2018 07/30/18   Davonna Belling, MD  enalapril (VASOTEC) 10 MG tablet Take 10 mg by mouth daily.    [provider]  famotidine (PEPCID) 20 MG tablet Take 1 tablet (20 mg total) by mouth 2 (two) times daily. Patient not taking: Reported on 08/22/2018 05/23/17   Hayden Rasmussen, MD  HYDROcodone-acetaminophen (NORCO/VICODIN) 5-325 MG tablet Take 1 tablet by mouth every 6 (six) hours as needed. For 5 days 08/27/18   [provider]  sucralfate (CARAFATE) 1 GM/10ML suspension Take 10 mLs (1 g total) by mouth 4 (four) times daily -  with meals and at bedtime. Patient not taking: Reported on 08/22/2018 05/23/17   Hayden Rasmussen, MD    Family History Family History  Problem Relation Age of Onset  . Breast cancer Mother   . Thyroid disease Mother     Social History Social History   Tobacco Use  . Smoking status: Former Smoker    Packs/day: 0.50    Years: 15.00    Pack years: 7.50    Types: Cigarettes     Quit date: 03/05/2015    Years since quitting: 3.5  . Smokeless tobacco: Never Used  Substance Use Topics  . Alcohol use: Not Currently    Alcohol/week: 0.0 standard drinks  . Drug use: No     Allergies   Morphine and related   Review of Systems Review of Systems  Constitutional: Negative for chills and fever.  HENT: Negative for congestion, rhinorrhea, sinus pressure and sore throat.   Eyes: Negative for pain and redness.  Respiratory: Negative for cough, shortness of breath and wheezing.   Cardiovascular: Negative for chest pain and palpitations.  Gastrointestinal: Positive for rectal pain. Negative for abdominal pain, constipation, diarrhea, nausea and vomiting.  Genitourinary: Negative for dysuria.  Musculoskeletal: Negative for arthralgias, back pain, myalgias and neck pain.  Skin: Negative for rash and wound.  Neurological: Negative for dizziness, syncope, weakness, numbness and headaches.  Psychiatric/Behavioral: Negative for confusion.     Physical Exam Updated Vital Signs BP (!) 162/88   Pulse 77   Temp 98 F (36.7 C) (Oral)   Resp 18   Ht 6\' 1"  (1.854 m)   Wt 127 kg   SpO2 99%   BMI 36.94 kg/m   Physical Exam Vitals signs and nursing note reviewed.  Constitutional:      General: He is not in acute distress.    Appearance: He is not ill-appearing.  HENT:     Head: Normocephalic and atraumatic.     Right Ear: Tympanic membrane and external ear normal.     Left Ear: Tympanic membrane and external ear normal.     Nose: Nose normal.     Mouth/Throat:     Mouth: Mucous membranes are moist.     Pharynx: Oropharynx is clear.  Eyes:     General: No scleral icterus.       Right eye: No discharge.        Left eye: No discharge.     Extraocular Movements: Extraocular movements intact.     Conjunctiva/sclera: Conjunctivae normal.     Pupils: Pupils are equal, round, and reactive to light.  Neck:     Musculoskeletal: Normal range of motion.     Vascular:  No JVD.  Cardiovascular:     Rate and Rhythm: Normal rate and regular rhythm.     Pulses: Normal pulses.          Radial pulses are 2+ on the right side and 2+ on the left side.     Heart sounds: Normal heart sounds.  Pulmonary:     Comments: Lungs clear to auscultation in all fields. Symmetric chest rise. No wheezing, rales, or rhonchi. Abdominal:     Comments: Abdomen is soft, non-distended, and non-tender in all quadrants. No rigidity, no guarding. No peritoneal signs.  Genitourinary:    Comments: Rectal exam chaperoned with Lurline Hare.  Patient had loose packing that was removed.  There is no purulent drainage.  Patient was exquisitely tender to palpation surgical incision with surrounding erythema.  Seton drain is visualized. Musculoskeletal: Normal range of motion.  Skin:    General: Skin is warm and dry.     Capillary Refill: Capillary refill takes less than 2 seconds.  Neurological:     Mental Status: He is oriented to person, place, and time.     GCS: GCS eye subscore is 4. GCS verbal subscore is 5. GCS motor subscore is 6.     Comments: Fluent speech, no facial droop.  Psychiatric:        Behavior: Behavior normal.      ED Treatments / Results  Labs (all labs ordered are listed, but only abnormal results are displayed) Labs Reviewed  BASIC METABOLIC PANEL - Abnormal; Notable for the following components:      Result Value   Sodium 134 (*)    Chloride 93 (*)    Glucose, Bld 107 (*)    Creatinine, Ser 1.28 (*)    All other components within normal limits  CBC WITH DIFFERENTIAL/PLATELET    EKG None  Radiology Ct Abdomen Pelvis W Contrast  Result Date: 09/07/2018 CLINICAL DATA:  Recent perianal abscess, treated 2 weeks ago, increased pain and swelling around surgical wound. Prior left lower lobectomy. EXAM: CT ABDOMEN AND PELVIS WITH CONTRAST TECHNIQUE: Multidetector CT imaging of  the abdomen and pelvis was performed using the standard protocol following bolus  administration of intravenous contrast. CONTRAST:  173mL OMNIPAQUE IOHEXOL 300 MG/ML  SOLN COMPARISON:  07/30/2018 FINDINGS: Lower chest: Mild calcification of the mitral valve. Volume loss in the left hemithorax compatible with history of left lower lobectomy. Hepatobiliary: Unremarkable Pancreas: Unremarkable Spleen: Unremarkable Adrenals/Urinary Tract: Subtle delayed phase hypodensity in the right upper to mid kidney medially measuring 1.6 by 1.0 cm on image 14/7, roughly similar appearance on 07/30/2018, poorly visible on portal venous phase. Adrenal glands normal. 2 mm right kidney upper pole nonobstructive calculus on image 47/6. 2 mm left kidney upper pole nonobstructive renal calculus on image 103/6. Stomach/Bowel: A loop like radiodensity compatible with a seton extends from the small posterior perianal small fluid collection which also contains a small amount of gas, along the perineum as shown on images 78-79 of series 6. Tissue planes in this vicinity are somewhat indistinct but no specific complicating feature is identified. The posterior perianal collection measures 1.3 by 0.8 cm on image 91/2 and is roughly similar in size and location to the fluid collection shown on 07/30/2018. Vascular/Lymphatic: Small lower retroperitoneal lymph nodes are probably reactive. Small inguinal lymph nodes are present including a 1.1 cm left inguinal lymph node on image 98/2 (previously 1.2 cm). Reproductive: Mild lobularity of the right anterior prostate gland, indenting the urinary bladder. The prostate measures 5.2 by 4.5 by 5.9 cm (volume = 72 cm^3). Other: No supplemental non-categorized findings. Musculoskeletal: Fatty left spermatic cord. 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 Multilevel bridging spurring in the lower thoracic spine. IMPRESSION: 1. Residual 1.3 by 0.8 cm posterior perianal fluid  collection now has a small amount of gas, and is also partially traversed by a seton which is shown on images 78-79 of series 6. No new perianal collection is identified. 2. Subtle hypodensity in the right kidney upper pole medially measuring 1.6 by 1.0 cm, roughly stable, only appreciable on the delayed images such as image 15/7. This lesion is of uncertain significance. Part of the appearance may simply be from streak artifact. Other options might include a small complex cyst or small renal mass. Consider follow up renal protocol MRI with and without contrast for definitive assessment. 3. Other imaging findings of potential clinical significance: Mild calcification of the mitral valve. Prostatomegaly. Impingement at L5-S1 partially due to chronic pars defects at L5 with grade 1 anterolisthesis. Single punctate nonobstructive bilateral renal calculi. Electronically Signed   By: Van Clines M.D.   On: 09/07/2018 13:00    Procedures Procedures (including critical care time)  Medications Ordered in ED Medications  HYDROmorphone (DILAUDID) injection 1 mg (1 mg Intravenous Given 09/07/18 1038)  ondansetron (ZOFRAN) injection 4 mg (4 mg Intravenous Given 09/07/18 1038)  iohexol (OMNIPAQUE) 300 MG/ML solution 100 mL (100 mLs Intravenous Contrast Given 09/07/18 1211)  HYDROmorphone (DILAUDID) injection 1 mg (1 mg Intravenous Given 09/07/18 1426)  ondansetron (ZOFRAN) injection 4 mg (4 mg Intravenous Given 09/07/18 1426)     Initial Impression / Assessment and Plan / ED Course  I have reviewed the triage vital signs and the nursing notes.  Pertinent labs & imaging results that were available during my care of the patient were reviewed by me and considered in my medical decision making (see chart for details).  Pt seen and examined. He is afebrile, he looks to  be uncomfortable however is non toxic appearing. His abdomen is soft, non distended. No peritoneal signs.  Patient extremely tender and would  not tolerate exam for me to see surgical wound.  Will give IV Dilaudid, check basic labs, and reassess.  BMP shows creatinine of 1.28, explained increased from 1 month ago when it was within normal range.  Otherwise unremarkable.  CBC also unremarkable, no leukocytosis, hemoglobin is stable at 15.1.  Patient reports pain has improved after Dilaudid, now rating it 6 out of 10 in severity.  He is able to tolerate more of the exam.  He had a loose piece of packing from the wound that was removed, I was able to visualize the seton drain.  Patient is still very tender nodes difficult to perform exam. The area around the incision is without erythema, no induration. Exam is non consistent with cellulitis. Given patient's history unable to tolerate exam CT abdomen pelvis ordered which does not show new perianal collection. Radiologist comments on residual 1.3 by 0.8 cm posterior perianal fluid collection now has a small amount of gas, and is also partially traversed by a seton. Case discussed with [facility] colorectal surgeon on-call Dr. Tommie Raymond. He recommends attempting to pack after pain medication. No antibiotics are indicated at this time as no signs of cellulitis or infection. If pt does not tolerate would have him evaluated in [facility] ED with colorectal if needed. Pt tolerated dressing change with minimal discomfort. We engaged in shared decision making and pt would like to be discharged home.   Patient is hemodynamically stable, in NAD, and able to ambulate in the ED. Evaluation does not show pathology that would require ongoing emergent intervention or inpatient treatment. I explained the diagnosis to the patient.  Pain has been managed and has no complaints prior to discharge. Patient is comfortable with above plan and is stable for discharge at this time. All questions were answered prior to disposition. Strict return precautions for returning to the ED were discussed. Encouraged follow up with pcp and Dr.  Morton Stall if needed. Findings and plan of care discussed with supervising physician Dr. Thurnell Garbe.  This note was prepared using Dragon voice recognition software and may include unintentional dictation errors due to the inherent limitations of voice recognition software.    Final Clinical Impressions(s) / ED Diagnoses   Final diagnoses:  Visit for wound check    ED Discharge Orders    None       Cherre Robins, PA-C 09/07/18 [place], [place], DO 09/12/18 [id number]

## 2018-09-07 NOTE — Discharge Instructions (Signed)
You have been seen today for pain after surgery. Please read and follow all provided instructions. Return to the emergency room for worsening condition or new concerning symptoms.    1. Medications:  Continue usual home medications. It is important to take your pain medications 30 minutes before dressing changes. Take your nausea medicine with it. Take medications as prescribed. Please review all of the medicines and only take them if you do not have an allergy to them.   2. Treatment: rest, drink plenty of fluids  3. Follow Up: Please follow up with Dr. Morton Stall in 2-5 days for discussion of your diagnoses and further evaluation after today's visit;    ?

## 2018-12-06 ENCOUNTER — Emergency Department (HOSPITAL_COMMUNITY)
Admission: EM | Admit: 2018-12-06 | Discharge: 2018-12-06 | Disposition: A | Discharge status: Home / Self Care | Payer: Medicaid Other | Attending: Emergency Medicine | Admitting: Emergency Medicine

## 2018-12-06 ENCOUNTER — Encounter (HOSPITAL_COMMUNITY): Payer: Self-pay

## 2018-12-06 ENCOUNTER — Other Ambulatory Visit: Payer: Self-pay

## 2018-12-06 ENCOUNTER — Emergency Department (HOSPITAL_COMMUNITY): Payer: Medicaid Other

## 2018-12-06 DIAGNOSIS — Z79899 Other long term (current) drug therapy: Secondary | ICD-10-CM | POA: Diagnosis not present

## 2018-12-06 DIAGNOSIS — Z87891 Personal history of nicotine dependence: Secondary | ICD-10-CM | POA: Insufficient documentation

## 2018-12-06 DIAGNOSIS — J449 Chronic obstructive pulmonary disease, unspecified: Secondary | ICD-10-CM | POA: Insufficient documentation

## 2018-12-06 DIAGNOSIS — E039 Hypothyroidism, unspecified: Secondary | ICD-10-CM | POA: Diagnosis not present

## 2018-12-06 DIAGNOSIS — K610 Anal abscess: Secondary | ICD-10-CM | POA: Diagnosis present

## 2018-12-06 DIAGNOSIS — I10 Essential (primary) hypertension: Secondary | ICD-10-CM | POA: Insufficient documentation

## 2018-12-06 LAB — CBC WITH DIFFERENTIAL/PLATELET
Abs Immature Granulocytes: 0.04 10*3/uL (ref 0.00–0.07)
Basophils Absolute: 0 10*3/uL (ref 0.0–0.1)
Basophils Relative: 0 %
Eosinophils Absolute: 0.1 10*3/uL (ref 0.0–0.5)
Eosinophils Relative: 2 %
HCT: 44.2 % (ref 39.0–52.0)
Hemoglobin: 14.6 g/dL (ref 13.0–17.0)
Immature Granulocytes: 0 %
Lymphocytes Relative: 16 %
Lymphs Abs: 1.5 10*3/uL (ref 0.7–4.0)
MCH: 31.9 pg (ref 26.0–34.0)
MCHC: 33 g/dL (ref 30.0–36.0)
MCV: 96.7 fL (ref 80.0–100.0)
Monocytes Absolute: 0.9 10*3/uL (ref 0.1–1.0)
Monocytes Relative: 10 %
Neutro Abs: 7 10*3/uL (ref 1.7–7.7)
Neutrophils Relative %: 72 %
Platelets: 244 10*3/uL (ref 150–400)
RBC: 4.57 MIL/uL (ref 4.22–5.81)
RDW: 14 % (ref 11.5–15.5)
WBC: 9.7 10*3/uL (ref 4.0–10.5)
nRBC: 0 % (ref 0.0–0.2)

## 2018-12-06 LAB — BASIC METABOLIC PANEL
Anion gap: 11 (ref 5–15)
BUN: 11 mg/dL (ref 6–20)
CO2: 29 mmol/L (ref 22–32)
Calcium: 8.6 mg/dL — ABNORMAL LOW (ref 8.9–10.3)
Chloride: 97 mmol/L — ABNORMAL LOW (ref 98–111)
Creatinine, Ser: 1.21 mg/dL (ref 0.61–1.24)
GFR calc Af Amer: >60 mL/min (ref >60)
GFR calc non Af Amer: >60 mL/min (ref >60)
Glucose, Bld: 106 mg/dL — ABNORMAL HIGH (ref 70–99)
Potassium: 3.5 mmol/L (ref 3.5–5.1)
Sodium: 137 mmol/L (ref 135–145)

## 2018-12-06 MED ORDER — ONDANSETRON HCL 4 MG/2ML IJ SOLN
4.0000 mg | Freq: Once | INTRAMUSCULAR | Status: AC
  Administered 2018-12-06: 16:00:00 4 mg via INTRAVENOUS
  Filled 2018-12-06: qty 2

## 2018-12-06 MED ORDER — IOHEXOL 300 MG/ML  SOLN
100.0000 mL | Freq: Once | INTRAMUSCULAR | Status: AC | PRN
  Administered 2018-12-06: 16:00:00 100 mL via INTRAVENOUS

## 2018-12-06 MED ORDER — SODIUM CHLORIDE 0.9 % IV BOLUS
500.0000 mL | Freq: Once | INTRAVENOUS | Status: AC
  Administered 2018-12-06: 16:00:00 500 mL via INTRAVENOUS

## 2018-12-06 MED ORDER — FENTANYL CITRATE (PF) 100 MCG/2ML IJ SOLN
100.0000 ug | INTRAMUSCULAR | Status: DC | PRN
  Administered 2018-12-06 (×2): 100 ug via INTRAVENOUS
  Filled 2018-12-06 (×2): qty 2

## 2018-12-06 MED ORDER — OXYCODONE-ACETAMINOPHEN 5-325 MG PO TABS: 1.0000 | ORAL_TABLET | ORAL | 0 refills | Status: DC | PRN

## 2018-12-06 MED ORDER — METRONIDAZOLE 500 MG PO TABS: 500.0000 mg | ORAL_TABLET | Freq: Three times a day (TID) | ORAL | 0 refills | Status: DC

## 2018-12-06 MED ORDER — CEPHALEXIN 500 MG PO CAPS: 500.0000 mg | ORAL_CAPSULE | Freq: Three times a day (TID) | ORAL | 0 refills | Status: DC

## 2018-12-06 NOTE — Discharge Instructions (Signed)
Your CT scan did not show evidence of an abscess today. Take pain medication as needed. Take antibiotics until finished. You need to follow-up with your surgeon as soon as you can. Activity as tolerated.

## 2018-12-06 NOTE — ED Triage Notes (Signed)
Pt had sx for perirectal abscess in July at [place]. 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 Pt says he has a tube still in place and has not followed up with surgeon he cancelled his follow up due to his work. States he has a hole and with drainage

## 2018-12-06 NOTE — ED Provider Notes (Signed)
[facility] Provider Note   CSN: [id number] Arrival date & time: 12/06/18  1111     History   Chief Complaint Chief Complaint  Patient presents with  . Abscess    HPI Fernando [name] is a 58 y.o. male.     HPI   He complains of persistent drainage pain and swelling from an abscess, treated by [facility], several months ago.  He has not followed up with his surgeon for several weeks.  He is a Programmer, systems.  He denies general weakness, fever, chills, nausea, vomiting, abdominal pain, inability to have bowel movement.  No change in urinary habits.  There are no other known modifying factors.  Past Medical History:  Diagnosis Date  . Acute respiratory failure with hypoxia (HCC) 08/27/2015   Mild-Oxygen saturation 88% with ambulation.  . Allergy   . Anal fissure   . 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 Complication of anesthesia    woke up during colonoscopy  . HCAP (healthcare-associated pneumonia) 05/12/2015  . Hypertension 12/30/2011  . Hypothyroidism 08/27/2015   New diagnosis  . Loose stools   . Lung mass    hx of left lower benign mass  . Obstructive sleep apnea 08/27/2015   Per history only; not confirmed with a sleep study yet.  Marland Kitchen Peri-rectal abscess    multiple.   . Pneumonia   . Shortness of breath dyspnea    with exertion  . 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 Tobacco abuse   . Umbilical hernia    has been repaired    Patient Active Problem List   Diagnosis Date Noted  . Costochondral chest pain 10/12/2016  . Hypothyroidism 08/27/2015  . Acute respiratory failure with hypoxia ([place]) 08/27/2015  . COPD (chronic obstructive pulmonary disease) ([place]) 08/27/2015  . Obstructive sleep apnea 08/27/2015  . Dyspnea on exertion 08/26/2015  . Left sided chest pain 08/26/2015  . Obesity 08/26/2015  . Anal fistula 08/26/2015  . Pedal edema 08/26/2015  . Hypokalemia 08/26/2015  . Chronic diarrhea 08/26/2015  . Essential hypertension  08/26/2015  . Pain in the chest   . HCAP (healthcare-associated pneumonia) 05/12/2015  . Nodule of left lung 05/03/2015  . Lung mass 03/30/2015  . Peri-rectal abscess 03/07/2015  . Abnormal CXR 03/07/2015  . Scrotal wall abscess 05/01/2013  . Cellulitis 05/01/2013  . Hematuria 04/30/2013  . Cellulitis of scrotum 04/29/2013  . Tick bite 04/29/2013  . Hypertension 12/30/2011    Past Surgical History:  Procedure Laterality Date  . ABSCESS DRAINAGE    . APPENDECTOMY    . CERVICAL FUSION    . COLONOSCOPY    . CYSTOSCOPY N/A 05/01/2013   Procedure: CYSTOSCOPY;  Surgeon: Marissa Nestle, MD;  Location: [facility];  Service: Urology;  Laterality: N/A;  . EVALUATION UNDER ANESTHESIA WITH FISTULECTOMY N/A 06/23/2015   Procedure: ANAL EXAM UNDER ANESTHESIA  FISTULOTOMY;  Surgeon: Leighton Ruff, MD;  Location: [facility];  Service: General;  Laterality: N/A;  . FACIAL COSMETIC SURGERY    . HERNIA REPAIR    . INCISION AND DRAINAGE ABSCESS Right 05/01/2013   Procedure: INCISION AND DRAINAGE RIGHT SCROTAL ABSCESS;  Surgeon: Marissa Nestle, MD;  Location: [facility];  Service: Urology;  Laterality: Right;  . INCISION AND DRAINAGE PERIRECTAL ABSCESS    . KNEE ARTHROSCOPY    . SPINE SURGERY     cervical  . VIDEO ASSISTED THORACOSCOPY (VATS)/ LOBECTOMY Left 05/03/2015   Procedure: LEFT  VIDEO ASSISTED THORACOSCOPY (VATS)/ LEFT LOWER LOBECTOMY, ON-Q CATHETER PLACEMENT;  Surgeon: Melrose Nakayama, MD;  Location: [facility];  Service: Thoracic;  Laterality: Left;  Marland Kitchen VIDEO BRONCHOSCOPY WITH ENDOBRONCHIAL ULTRASOUND N/A 04/22/2015   Procedure: VIDEO BRONCHOSCOPY WITH ENDOBRONCHIAL ULTRASOUND;  Surgeon: Melrose Nakayama, MD;  Location: [facility];  Service: Thoracic;  Laterality: N/A;        Home Medications    Prior to Admission medications   Medication Sig Start Date End Date Taking? Authorizing Provider  albuterol (PROVENTIL HFA;VENTOLIN HFA) 108 (90 Base) MCG/ACT inhaler Inhale 2 puffs  into the lungs every 6 (six) hours as needed for wheezing or shortness of breath. Patient not taking: Reported on 09/07/2018 03/12/15   Rai, Vernelle Emerald, MD  ALPRAZolam Duanne Moron) 1 MG tablet Take 1 tablet (1 mg total) by mouth 3 (three) times daily as needed for anxiety. Patient not taking: Reported on 09/07/2018 03/12/15   Mendel Corning, MD  amoxicillin-clavulanate (AUGMENTIN) 875-125 MG tablet Take 1 tablet by mouth every 12 (twelve) hours. Patient not taking: Reported on 08/22/2018 07/30/18   Davonna Belling, MD  enalapril (VASOTEC) 10 MG tablet Take 10 mg by mouth daily.    [provider]  famotidine (PEPCID) 20 MG tablet Take 1 tablet (20 mg total) by mouth 2 (two) times daily. Patient not taking: Reported on 08/22/2018 05/23/17   Hayden Rasmussen, MD  HYDROcodone-acetaminophen (NORCO/VICODIN) 5-325 MG tablet Take 1 tablet by mouth every 6 (six) hours as needed. For 5 days 08/27/18   [provider]  sucralfate (CARAFATE) 1 GM/10ML suspension Take 10 mLs (1 g total) by mouth 4 (four) times daily -  with meals and at bedtime. Patient not taking: Reported on 08/22/2018 05/23/17   Hayden Rasmussen, MD    Family History Family History  Problem Relation Age of Onset  . Breast cancer Mother   . Thyroid disease Mother     Social History Social History   Tobacco Use  . Smoking status: Former Smoker    Packs/day: 0.50    Years: 15.00    Pack years: 7.50    Types: Cigarettes    Quit date: 03/05/2015    Years since quitting: 3.7  . Smokeless tobacco: Never Used  Substance Use Topics  . Alcohol use: Not Currently    Alcohol/week: 0.0 standard drinks  . Drug use: No     Allergies   Morphine and related   Review of Systems Review of Systems  All other systems reviewed and are negative.    Physical Exam Updated Vital Signs BP (!) 152/115 (BP Location: Right Arm)   Pulse 94   Temp 98.7 F (37.1 C) (Oral)   Resp 14   Ht 6\' 2"  (1.88 m)   Wt 120.2 kg   SpO2 92%    BMI 34.02 kg/m   Physical Exam Vitals signs and nursing note reviewed.  Constitutional:      General: He is in acute distress (He is uncomfortable).     Appearance: Normal appearance. He is well-developed. He is obese. He is not ill-appearing, toxic-appearing or diaphoretic.  HENT:     Head: Normocephalic and atraumatic.     Right Ear: External ear normal.     Left Ear: External ear normal.  Eyes:     Conjunctiva/sclera: Conjunctivae normal.     Pupils: Pupils are equal, round, and reactive to light.  Neck:     Musculoskeletal: Normal range of motion and neck supple.  Trachea: Phonation normal.  Cardiovascular:     Rate and Rhythm: Normal rate and regular rhythm.     Heart sounds: Normal heart sounds.  Pulmonary:     Effort: Pulmonary effort is normal.     Breath sounds: Normal breath sounds.  Abdominal:     General: There is no distension.     Palpations: Abdomen is soft.     Tenderness: There is no abdominal tenderness.  Genitourinary:    Comments: Draining wound posterior to anus.  Purulent drainage.  No significant surrounding erythema or induration although the area is very tender.  There is a seton tube, extending from the anus through the skin defect cavity. Musculoskeletal: Normal range of motion.  Skin:    General: Skin is warm and dry.  Neurological:     Mental Status: He is alert and oriented to person, place, and time.     Cranial Nerves: No cranial nerve deficit.     Sensory: No sensory deficit.     Motor: No abnormal muscle tone.     Coordination: Coordination normal.  Psychiatric:        Behavior: Behavior normal.        Thought Content: Thought content normal.        Judgment: Judgment normal.      ED Treatments / Results  Labs (all labs ordered are listed, but only abnormal results are displayed) Labs Reviewed - No data to display  EKG None  Radiology No results found.  Procedures Procedures (including critical care time)  Medications  Ordered in ED Medications - No data to display   Initial Impression / Assessment and Plan / ED Course  I have reviewed the triage vital signs and the nursing notes.  Pertinent labs & imaging results that were available during my care of the patient were reviewed by me and considered in my medical decision making (see chart for details).         Patient Vitals for the past 24 hrs:  BP Temp Temp src Pulse Resp SpO2 Height Weight  12/06/18 1134 - - - - - - 6\' 2"  (1.88 m) 120.2 kg  12/06/18 1129 (!) 152/115 98.7 F (37.1 C) Oral 94 14 92 % - -    Medical Decision Making: Persistent draining abscess with seton tube  in place.  Patient is nontoxic.  Evaluation in the ED, screening labs and CT to assess for nondraining abscess.  Disposition will be dependent on these tests, with possible consultation, with his surgeon.  CRITICAL CARE-no Performed by: Daleen Bo  Nursing Notes Reviewed/ Care Coordinated Applicable Imaging Reviewed Interpretation of Laboratory Data incorporated into ED treatment  Plan-disposition by Dr. Wilson Singer following completion of evaluation.   Final Clinical Impressions(s) / ED Diagnoses   Final diagnoses:  Perianal abscess    ED Discharge Orders    None       Daleen Bo, MD 12/06/18 1455

## 2019-11-09 ENCOUNTER — Encounter (HOSPITAL_COMMUNITY): Payer: Self-pay

## 2019-11-09 ENCOUNTER — Emergency Department (HOSPITAL_COMMUNITY)
Admission: EM | Admit: 2019-11-09 | Discharge: 2019-11-09 | Disposition: A | Discharge status: Home / Self Care | Payer: Medicaid Other | Attending: Emergency Medicine | Admitting: Emergency Medicine

## 2019-11-09 ENCOUNTER — Other Ambulatory Visit: Payer: Self-pay

## 2019-11-09 DIAGNOSIS — S0591XA Unspecified injury of right eye and orbit, initial encounter: Secondary | ICD-10-CM | POA: Diagnosis present

## 2019-11-09 DIAGNOSIS — S0501XA Injury of conjunctiva and corneal abrasion without foreign body, right eye, initial encounter: Secondary | ICD-10-CM | POA: Diagnosis not present

## 2019-11-09 DIAGNOSIS — Y33XXXA Other specified events, undetermined intent, initial encounter: Secondary | ICD-10-CM | POA: Insufficient documentation

## 2019-11-09 DIAGNOSIS — E039 Hypothyroidism, unspecified: Secondary | ICD-10-CM | POA: Insufficient documentation

## 2019-11-09 DIAGNOSIS — I10 Essential (primary) hypertension: Secondary | ICD-10-CM | POA: Insufficient documentation

## 2019-11-09 DIAGNOSIS — Z87891 Personal history of nicotine dependence: Secondary | ICD-10-CM | POA: Diagnosis not present

## 2019-11-09 DIAGNOSIS — Y92012 Bathroom of single-family (private) house as the place of occurrence of the external cause: Secondary | ICD-10-CM | POA: Insufficient documentation

## 2019-11-09 DIAGNOSIS — J449 Chronic obstructive pulmonary disease, unspecified: Secondary | ICD-10-CM | POA: Diagnosis not present

## 2019-11-09 MED ORDER — ERYTHROMYCIN 5 MG/GM OP OINT: TOPICAL_OINTMENT | OPHTHALMIC | 0 refills | Status: DC

## 2019-11-09 MED ORDER — ERYTHROMYCIN 5 MG/GM OP OINT
TOPICAL_OINTMENT | Freq: Once | OPHTHALMIC | Status: AC
  Filled 2019-11-09: qty 3.5

## 2019-11-09 MED ORDER — FLUORESCEIN SODIUM 1 MG OP STRP
1.0000 | ORAL_STRIP | Freq: Once | OPHTHALMIC | Status: AC
  Administered 2019-11-09: 1 via OPHTHALMIC
  Filled 2019-11-09: qty 1

## 2019-11-09 MED ORDER — TETRACAINE HCL 0.5 % OP SOLN
2.0000 [drp] | Freq: Once | OPHTHALMIC | Status: AC
  Administered 2019-11-09: 2 [drp] via OPHTHALMIC
  Filled 2019-11-09: qty 4

## 2019-11-09 NOTE — ED Provider Notes (Signed)
[facility] EMERGENCY DEPARTMENT Provider Note   CSN: [id number] Arrival date & time: 11/09/19  1401     History Chief Complaint  Patient presents with   Eye Problem    Fernando [name] is a 59 y.o. male present emerge department right eye pain.  He reports that he got other shower this morning and was drying his face with a towel, and feels like "something got in my eye".  He says he has sensation since then of burning in grip in his right eye.  It is worse when his eyes open.  He says it feels like "something is in there".  He denies any prior history of eye surgery.  He reports his vision is 20/20.  He does not wear contacts or glasses.  He denies any fevers or chills.  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  HPI     Past Medical History:  Diagnosis Date   Acute respiratory failure with hypoxia ([place]) 08/27/2015   Mild-Oxygen saturation 88% with ambulation.   Allergy    Anal fissure    Anal fistula 93/2355   Complication of anesthesia    woke up during colonoscopy   HCAP (healthcare-associated pneumonia) 05/12/2015   Hypertension 12/30/2011   Hypothyroidism 08/27/2015   New diagnosis   Loose stools    Lung mass    hx of left lower benign mass   Obstructive sleep apnea 08/27/2015   Per history only; not confirmed with a sleep study yet.   Peri-rectal abscess    multiple.    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   Tobacco abuse    Umbilical hernia    has been repaired    Patient Active Problem List   Diagnosis Date Noted   Costochondral chest pain 10/12/2016   Hypothyroidism 08/27/2015   Acute respiratory failure with hypoxia ([place]) 08/27/2015   COPD (chronic obstructive pulmonary disease) ([place]) 08/27/2015   Obstructive sleep apnea 08/27/2015   Dyspnea on exertion 08/26/2015   Left sided chest pain 08/26/2015   Obesity  08/26/2015   Anal fistula 08/26/2015   Pedal edema 08/26/2015   Hypokalemia 08/26/2015   Chronic diarrhea 08/26/2015   Essential hypertension 08/26/2015   Pain in the chest    HCAP (healthcare-associated pneumonia) 05/12/2015   Nodule of left lung 05/03/2015   Lung mass 03/30/2015   Peri-rectal abscess 03/07/2015   Abnormal CXR 03/07/2015   Scrotal wall abscess 05/01/2013   Cellulitis 05/01/2013   Hematuria 04/30/2013   Cellulitis of scrotum 04/29/2013   Tick bite 04/29/2013   Hypertension 12/30/2011    Past Surgical History:  Procedure Laterality Date   ABSCESS DRAINAGE     APPENDECTOMY     CERVICAL FUSION     COLONOSCOPY     CYSTOSCOPY N/A 05/01/2013   Procedure: CYSTOSCOPY;  Surgeon: Marissa Nestle, MD;  Location: [facility];  Service: Urology;  Laterality: N/A;   EVALUATION UNDER ANESTHESIA WITH FISTULECTOMY N/A 06/23/2015   Procedure: ANAL EXAM UNDER ANESTHESIA  FISTULOTOMY;  Surgeon: Leighton Ruff, MD;  Location: [place];  Service: General;  Laterality: N/A;   FACIAL COSMETIC SURGERY     HERNIA REPAIR     INCISION AND DRAINAGE ABSCESS Right 05/01/2013   Procedure: INCISION AND DRAINAGE RIGHT SCROTAL ABSCESS;  Surgeon: Marissa Nestle, MD;  [facility]: [facility];  Service: Urology;  Laterality: Right;   INCISION AND DRAINAGE PERIRECTAL ABSCESS     KNEE ARTHROSCOPY     SPINE SURGERY     cervical   VIDEO ASSISTED THORACOSCOPY (VATS)/ LOBECTOMY Left 05/03/2015   Procedure: LEFT VIDEO ASSISTED THORACOSCOPY (VATS)/ LEFT LOWER LOBECTOMY, ON-Q CATHETER PLACEMENT;  Surgeon: Melrose Nakayama, MD;  Location: [facility];  Service: Thoracic;  Laterality: Left;   VIDEO BRONCHOSCOPY WITH ENDOBRONCHIAL ULTRASOUND N/A 04/22/2015   Procedure: VIDEO BRONCHOSCOPY WITH ENDOBRONCHIAL ULTRASOUND;  Surgeon: Melrose Nakayama, MD;  Location: [place];  Service: Thoracic;  Laterality: N/A;       Family History  Problem Relation Age of Onset    Breast cancer Mother    Thyroid disease Mother     Social History   Tobacco Use   Smoking status: Former Smoker    Packs/day: 0.50    Years: 15.00    Pack years: 7.50    Types: Cigarettes    Quit date: 03/05/2015    Years since quitting: 4.6   Smokeless tobacco: Never Used  Vaping Use   Vaping Use: Never used  Substance Use Topics   Alcohol use: Not Currently    Alcohol/week: 0.0 standard drinks   Drug use: No    Home Medications Prior to Admission medications   Medication Sig Start Date End Date Taking? Authorizing Provider  albuterol (PROVENTIL HFA;VENTOLIN HFA) 108 (90 Base) MCG/ACT inhaler Inhale 2 puffs into the lungs every 6 (six) hours as needed for wheezing or shortness of breath. 03/12/15   Rai, Vernelle Emerald, MD  ALPRAZolam Duanne Moron) 1 MG tablet Take 1 tablet (1 mg total) by mouth 3 (three) times daily as needed for anxiety. Patient taking differently: Take 1 mg by mouth 3 (three) times daily.  03/12/15   Rai, Vernelle Emerald, MD  cephALEXin (KEFLEX) 500 MG capsule Take 1 capsule (500 mg total) by mouth 3 (three) times daily. 12/06/18   Virgel Manifold, MD  enalapril (VASOTEC) 10 MG tablet Take 10 mg by mouth daily.    [provider]  erythromycin ophthalmic ointment Place a 1/2 inch ribbon of ointment into the lower eyelid four times per day for the next 5 days 11/09/19   Wyvonnia Dusky, MD  metroNIDAZOLE (FLAGYL) 500 MG tablet Take 1 tablet (500 mg total) by mouth 3 (three) times daily. 12/06/18   Virgel Manifold, MD  oxyCODONE-acetaminophen (PERCOCET/ROXICET) 5-325 MG tablet Take 1 tablet by mouth every 4 (four) hours as needed for severe pain. 12/06/18   Virgel Manifold, MD    Allergies    Morphine and related  Review of Systems   Review of Systems  Constitutional: Negative for chills and fever.  HENT: Negative for ear pain and sore throat.   Eyes: Positive for photophobia, pain, discharge, redness, itching and visual disturbance.  Respiratory: Negative for  cough and shortness of breath.   Cardiovascular: Negative for chest pain and palpitations.  Skin: Negative for color change and rash.  Neurological: Negative for syncope and light-headedness.  Psychiatric/Behavioral: Negative for agitation and confusion.  All other systems reviewed and are negative.   Physical Exam Updated Vital Signs BP (!) 159/104 (BP Location: Right Arm)    Pulse 92    Temp 98.3 F (36.8 C) (Oral)    Resp 18    Ht 6\' 2"  (1.88 m)    Wt 122.5 kg    SpO2 96%    BMI 34.67 kg/m   Physical Exam Vitals and nursing note reviewed.  Constitutional:      Appearance: He is well-developed.  HENT:     Head: Normocephalic and atraumatic.  Eyes:     General: Lids are normal. Lids are everted, no foreign bodies appreciated. Vision grossly intact. Gaze aligned appropriately.        Right eye: No foreign body.     Extraocular Movements: Extraocular movements intact.     Right eye: Normal extraocular motion.     Left eye: Normal extraocular motion.     Conjunctiva/sclera:     Right eye: Right conjunctiva is injected. No exudate or hemorrhage.    Left eye: Left conjunctiva is not injected. No chemosis, exudate or hemorrhage.    Comments: Flourescien staining noting corneal abrasion medial aspect of right eye, no abrasion overlying pupil  Cardiovascular:     Rate and Rhythm: Normal rate and regular rhythm.  Pulmonary:     Effort: Pulmonary effort is normal. No respiratory distress.  Musculoskeletal:     Cervical back: Neck supple.  Skin:    General: Skin is warm and dry.  Neurological:     General: No focal deficit present.     Mental Status: He is alert and oriented to person, place, and time.  Psychiatric:        Mood and Affect: Mood normal.        Behavior: Behavior normal.     ED Results / Procedures / Treatments   Labs (all labs ordered are listed, but only abnormal results are displayed) Labs Reviewed - No data to display  EKG None  Radiology No results  found.  Procedures Procedures (including critical care time)  Medications Ordered in ED Medications  tetracaine (PONTOCAINE) 0.5 % ophthalmic solution 2 drop (2 drops Both Eyes Given by Other 11/09/19 1600)  fluorescein ophthalmic strip 1 strip (1 strip Both Eyes Given by Other 11/09/19 1600)  erythromycin ophthalmic ointment ( Right Eye Given 11/09/19 1623)    ED Course  I have reviewed the triage vital signs and the nursing notes.  Pertinent labs & imaging results that were available during my care of the patient were reviewed by me and considered in my medical decision making (see chart for details).  59 yo male presenting to ED with right eye corneal abrasion from this morning. It does not appear to involve the pupil No evidence of ruptured globe on exam Doubt this is acute angle closure glaucoma  Will prescribe erythromycin ointment, tylenol and motrin advised for pain, f/u with ophtho  Will offer work note for this week     Final Clinical Impression(s) / ED Diagnoses Final diagnoses:  Abrasion of right cornea, initial encounter    Rx / DC Orders ED Discharge Orders         Ordered    erythromycin ophthalmic ointment        11/09/19 1623           Wyvonnia Dusky, MD 11/09/19 [phone]

## 2019-11-09 NOTE — Discharge Instructions (Signed)
Call Dr Serita Grit office to ask for a follow up appointment this week for your ER visit today.  Tell them you had a corneal abrasion and the ER doctor would like you seen in the next 1-2 days if possible.  For pain at home you can take tylenol 650 mg every 6 hours, and motrin (advil, ibuprofen) 600 mg every 6 hours, together or separate, for the next 5 days.  You can also put a cold compress on your closed eye if it is throbbing.  Put the ointment on your eye four times per day for the next 5 days.  Smear it on your lower eyelid like we showed you.  I would advise that you limit your driving for the next week until you have recovered.  I provided a work note for 7 days.

## 2019-11-09 NOTE — ED Triage Notes (Signed)
Pt presents to ED with complaints of right eye draining and redness. Pt states he feels like something is in his eye.

## 2019-11-09 NOTE — ED Notes (Signed)
Introduced self to patient. Pt sitting in bed with no obvious signs of distress noted. Bed is locked in the lowest position, side rails x1, call bell within reach. Educated on hourly rounding and call light use at this time. Respirations are even and unlabored with equal chest rise and fall.

## 2020-03-12 ENCOUNTER — Other Ambulatory Visit: Payer: Self-pay

## 2020-03-12 ENCOUNTER — Encounter (HOSPITAL_COMMUNITY): Payer: Self-pay

## 2020-03-12 ENCOUNTER — Emergency Department (HOSPITAL_COMMUNITY): Payer: Medicaid Other

## 2020-03-12 ENCOUNTER — Emergency Department (HOSPITAL_COMMUNITY)
Admission: EM | Admit: 2020-03-12 | Discharge: 2020-03-12 | Disposition: A | Discharge status: Home / Self Care | Payer: Medicaid Other | Attending: Emergency Medicine | Admitting: Emergency Medicine

## 2020-03-12 DIAGNOSIS — Z87891 Personal history of nicotine dependence: Secondary | ICD-10-CM | POA: Diagnosis not present

## 2020-03-12 DIAGNOSIS — J449 Chronic obstructive pulmonary disease, unspecified: Secondary | ICD-10-CM | POA: Insufficient documentation

## 2020-03-12 DIAGNOSIS — I7789 Other specified disorders of arteries and arterioles: Secondary | ICD-10-CM | POA: Diagnosis not present

## 2020-03-12 DIAGNOSIS — R0602 Shortness of breath: Secondary | ICD-10-CM | POA: Diagnosis present

## 2020-03-12 DIAGNOSIS — R072 Precordial pain: Secondary | ICD-10-CM

## 2020-03-12 DIAGNOSIS — E876 Hypokalemia: Secondary | ICD-10-CM | POA: Insufficient documentation

## 2020-03-12 DIAGNOSIS — I10 Essential (primary) hypertension: Secondary | ICD-10-CM | POA: Diagnosis not present

## 2020-03-12 DIAGNOSIS — E039 Hypothyroidism, unspecified: Secondary | ICD-10-CM | POA: Diagnosis not present

## 2020-03-12 DIAGNOSIS — R600 Localized edema: Secondary | ICD-10-CM | POA: Insufficient documentation

## 2020-03-12 DIAGNOSIS — Z79899 Other long term (current) drug therapy: Secondary | ICD-10-CM | POA: Diagnosis not present

## 2020-03-12 DIAGNOSIS — N2889 Other specified disorders of kidney and ureter: Secondary | ICD-10-CM | POA: Insufficient documentation

## 2020-03-12 DIAGNOSIS — R062 Wheezing: Secondary | ICD-10-CM

## 2020-03-12 LAB — CBC WITH DIFFERENTIAL/PLATELET
Abs Immature Granulocytes: 0.03 10*3/uL (ref 0.00–0.07)
Basophils Absolute: 0 10*3/uL (ref 0.0–0.1)
Basophils Relative: 0 %
Eosinophils Absolute: 0.2 10*3/uL (ref 0.0–0.5)
Eosinophils Relative: 3 %
HCT: 42 % (ref 39.0–52.0)
Hemoglobin: 14.5 g/dL (ref 13.0–17.0)
Immature Granulocytes: 0 %
Lymphocytes Relative: 19 %
Lymphs Abs: 1.6 10*3/uL (ref 0.7–4.0)
MCH: 32.5 pg (ref 26.0–34.0)
MCHC: 34.5 g/dL (ref 30.0–36.0)
MCV: 94.2 fL (ref 80.0–100.0)
Monocytes Absolute: 0.7 10*3/uL (ref 0.1–1.0)
Monocytes Relative: 8 %
Neutro Abs: 5.6 10*3/uL (ref 1.7–7.7)
Neutrophils Relative %: 70 %
Platelets: 235 10*3/uL (ref 150–400)
RBC: 4.46 MIL/uL (ref 4.22–5.81)
RDW: 13.9 % (ref 11.5–15.5)
WBC: 8.2 10*3/uL (ref 4.0–10.5)
nRBC: 0 % (ref 0.0–0.2)

## 2020-03-12 LAB — TROPONIN I (HIGH SENSITIVITY)
Troponin I (High Sensitivity): 9 ng/L (ref <18)
Troponin I (High Sensitivity): 10 ng/L (ref <18)

## 2020-03-12 LAB — COMPREHENSIVE METABOLIC PANEL
ALT: 24 U/L (ref 0–44)
AST: 25 U/L (ref 15–41)
Albumin: 3.8 g/dL (ref 3.5–5.0)
Alkaline Phosphatase: 91 U/L (ref 38–126)
Anion gap: 10 (ref 5–15)
BUN: 14 mg/dL (ref 6–20)
CO2: 26 mmol/L (ref 22–32)
Calcium: 8.7 mg/dL — ABNORMAL LOW (ref 8.9–10.3)
Chloride: 101 mmol/L (ref 98–111)
Creatinine, Ser: 1.03 mg/dL (ref 0.61–1.24)
GFR, Estimated: >60 mL/min (ref >60)
Glucose, Bld: 115 mg/dL — ABNORMAL HIGH (ref 70–99)
Potassium: 3 mmol/L — ABNORMAL LOW (ref 3.5–5.1)
Sodium: 137 mmol/L (ref 135–145)
Total Bilirubin: 0.4 mg/dL (ref 0.3–1.2)
Total Protein: 7.3 g/dL (ref 6.5–8.1)

## 2020-03-12 LAB — LIPASE, BLOOD: Lipase: 34 U/L (ref 11–51)

## 2020-03-12 MED ORDER — SODIUM CHLORIDE 0.9 % IV BOLUS
1000.0000 mL | Freq: Once | INTRAVENOUS | Status: AC
  Administered 2020-03-12: 1000 mL via INTRAVENOUS

## 2020-03-12 MED ORDER — METHYLPREDNISOLONE SODIUM SUCC 125 MG IJ SOLR
125.0000 mg | Freq: Once | INTRAMUSCULAR | Status: AC
  Administered 2020-03-12: 125 mg via INTRAVENOUS
  Filled 2020-03-12: qty 2

## 2020-03-12 MED ORDER — POTASSIUM CHLORIDE CRYS ER 20 MEQ PO TBCR
40.0000 meq | EXTENDED_RELEASE_TABLET | Freq: Once | ORAL | Status: AC
  Administered 2020-03-12: 40 meq via ORAL
  Filled 2020-03-12: qty 2

## 2020-03-12 MED ORDER — ALBUTEROL SULFATE HFA 108 (90 BASE) MCG/ACT IN AERS
2.0000 | INHALATION_SPRAY | Freq: Once | RESPIRATORY_TRACT | Status: AC
  Administered 2020-03-12: 2 via RESPIRATORY_TRACT
  Filled 2020-03-12: qty 6.7

## 2020-03-12 MED ORDER — ONDANSETRON HCL 4 MG/2ML IJ SOLN
4.0000 mg | Freq: Once | INTRAMUSCULAR | Status: AC
  Administered 2020-03-12: 4 mg via INTRAVENOUS
  Filled 2020-03-12: qty 2

## 2020-03-12 MED ORDER — FENTANYL CITRATE (PF) 100 MCG/2ML IJ SOLN
50.0000 ug | Freq: Once | INTRAMUSCULAR | Status: AC
  Administered 2020-03-12: 50 ug via INTRAVENOUS
  Filled 2020-03-12: qty 2

## 2020-03-12 MED ORDER — IOHEXOL 350 MG/ML SOLN
100.0000 mL | Freq: Once | INTRAVENOUS | Status: AC | PRN
  Administered 2020-03-12: 100 mL via INTRAVENOUS

## 2020-03-12 MED ORDER — PREDNISONE 50 MG PO TABS: 50.0000 mg | ORAL_TABLET | Freq: Every day | ORAL | 0 refills | Status: AC

## 2020-03-12 MED ORDER — ALBUTEROL SULFATE HFA 108 (90 BASE) MCG/ACT IN AERS: 1.0000 | INHALATION_SPRAY | Freq: Four times a day (QID) | RESPIRATORY_TRACT | 0 refills | Status: DC | PRN

## 2020-03-12 NOTE — ED Provider Notes (Signed)
[facility] EMERGENCY DEPARTMENT Provider Note   CSN: [id number] Arrival date & time: 03/12/20  4403     History Chief Complaint  Patient presents with  . Shortness of Breath    Fernando [name] [name] a 60 y.o. 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  Has pleuritic chest pain to his right long.  States when Fernando takes a deep breath Fernando gets a sharp pain that radiates into his back.  Not necessarily exertional in  nature.  Also has mid epigastric pain which [name] not radiating to his back.  No history of AAA or dissection.  Patient also states that Fernando has had chronic drainage from his perirectal fistula. Was seen by [facility] in November and told this was not infectious and just serosanguineous fluid.  Changes pad 4 x daily 2/2 drainage. They did open the patient that Fernando needed eventually a colostomy however patient states Fernando was not amenable to this and still [name] not.  States Fernando has a chronic cough productive of clear and yellow sputum.  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  States occasionally has bilateral lower extremity edema however no unilateral swelling.  States his swelling [name] actually improved.  No PND orthopnea.  No history of heart failure.  No history of PE, DVT however did have lung cancer.  No fever, chills, nausea, vomiting, hemoptysis, diarrhea, dysuria.  Denies additional aggravating or  relieving factors.  Rates his current pain a 9/10.  Denies additional aggravating or alleviating factors.  No history of pancreatitis, chronic NSAID use.  History obtained from patient and past medical records.  No interpreter used.  HPI     Past Medical History:  Diagnosis Date  . Acute respiratory failure with hypoxia (HCC) 08/27/2015   Mild-Oxygen saturation 88% with ambulation.  . Allergy   . Anal  fissure   . 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 Complication of anesthesia    woke up during colonoscopy  . HCAP (healthcare-associated pneumonia) 05/12/2015  . Hypertension 12/30/2011  . Hypothyroidism 08/27/2015   New diagnosis  . Loose stools   . Lung mass    hx of left lower benign mass  . Obstructive sleep apnea 08/27/2015   Per history only; not confirmed with a sleep study yet.  Marland Kitchen Peri-rectal abscess    multiple.   . Pneumonia   . Shortness of breath dyspnea    with exertion  . 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 Tobacco abuse   . Umbilical hernia    has been repaired    Patient Active Problem List   Diagnosis Date Noted  . Costochondral chest pain 10/12/2016  . Hypothyroidism 08/27/2015  . Acute respiratory failure with hypoxia ([place]) 08/27/2015  . COPD (chronic obstructive pulmonary disease) ([place]) 08/27/2015  . Obstructive sleep apnea 08/27/2015  . Dyspnea on exertion 08/26/2015  . Left sided chest pain 08/26/2015  . Obesity 08/26/2015  . Anal fistula 08/26/2015  . Pedal edema 08/26/2015  . Hypokalemia 08/26/2015  . Chronic diarrhea 08/26/2015  . Essential hypertension 08/26/2015  . Pain in the chest   . HCAP (healthcare-associated pneumonia) 05/12/2015  . Nodule of left lung 05/03/2015  . Lung mass 03/30/2015  . Peri-rectal abscess 03/07/2015  . Abnormal CXR 03/07/2015  . Scrotal wall abscess 05/01/2013  .  Cellulitis 05/01/2013  . Hematuria 04/30/2013  . Cellulitis of scrotum 04/29/2013  . Tick bite 04/29/2013  . Hypertension 12/30/2011    Past Surgical History:  Procedure Laterality Date  . ABSCESS DRAINAGE    . APPENDECTOMY    . CERVICAL FUSION    . COLONOSCOPY    . CYSTOSCOPY N/A 05/01/2013   Procedure: CYSTOSCOPY;  Surgeon: Marissa Nestle, MD;  Location: [facility];  Service: Urology;  Laterality: N/A;  . EVALUATION UNDER ANESTHESIA WITH FISTULECTOMY N/A 06/23/2015   Procedure: ANAL EXAM UNDER ANESTHESIA  FISTULOTOMY;  Surgeon: Leighton Ruff, MD;   Location: [place];  Service: General;  Laterality: N/A;  . FACIAL COSMETIC SURGERY    . HERNIA REPAIR    . INCISION AND DRAINAGE ABSCESS Right 05/01/2013   Procedure: INCISION AND DRAINAGE RIGHT SCROTAL ABSCESS;  Surgeon: Marissa Nestle, MD;  Location: [facility];  Service: Urology;  Laterality: Right;  . INCISION AND DRAINAGE PERIRECTAL ABSCESS    . KNEE ARTHROSCOPY    . SPINE SURGERY     cervical  . VIDEO ASSISTED THORACOSCOPY (VATS)/ LOBECTOMY Left 05/03/2015   Procedure: LEFT VIDEO ASSISTED THORACOSCOPY (VATS)/ LEFT LOWER LOBECTOMY, ON-Q CATHETER PLACEMENT;  Surgeon: Melrose Nakayama, MD;  Location: [place];  Service: Thoracic;  Laterality: Left;  Marland Kitchen VIDEO BRONCHOSCOPY WITH ENDOBRONCHIAL ULTRASOUND N/A 04/22/2015   Procedure: VIDEO BRONCHOSCOPY WITH ENDOBRONCHIAL ULTRASOUND;  Surgeon: Melrose Nakayama, MD;  Location: [facility];  Service: Thoracic;  Laterality: N/A;       Family History  Problem Relation Age of Onset  . Breast cancer Mother   . Thyroid disease Mother     Social History   Tobacco Use  . Smoking status: Former Smoker    Packs/day: 0.50    Years: 15.00    Pack years: 7.50    Types: Cigarettes    Quit date: 03/05/2015    Years since quitting: 5.0  . Smokeless tobacco: Never Used  Vaping Use  . Vaping Use: Never used  Substance Use Topics  . Alcohol use: Not Currently    Alcohol/week: 0.0 standard drinks  . Drug use: No    Home Medications Prior to Admission medications   Medication Sig Start Date End Date Taking? Authorizing Provider  albuterol (VENTOLIN HFA) 108 (90 Base) MCG/ACT inhaler Inhale 1-2 puffs into the lungs every 6 (six) hours as needed for wheezing or shortness of breath. 03/12/20  Yes Payeton Germani A, PA-C  ALPRAZolam (XANAX) 1 MG tablet Take 1 tablet (1 mg total) by mouth 3 (three) times daily as needed for anxiety. Patient taking differently: Take 1 mg by mouth 3 (three) times daily. 03/12/15  Yes Rai, Ripudeep K, MD   enalapril (VASOTEC) 10 MG tablet Take 10 mg by mouth daily.   Yes [provider]  ibuprofen (ADVIL) 200 MG tablet Take 200 mg by mouth every 6 (six) hours as needed for fever, headache or mild pain.   Yes [provider]  predniSONE (DELTASONE) 50 MG tablet Take 1 tablet (50 mg total) by mouth daily for 5 days. 03/12/20 03/17/20 Yes Juniel Groene A, PA-C  cephALEXin (KEFLEX) 500 MG capsule Take 1 capsule (500 mg total) by mouth 3 (three) times daily. Patient not taking: No sig reported 12/06/18   Virgel Manifold, MD  erythromycin ophthalmic ointment Place a 1/2 inch ribbon of ointment into the lower eyelid four times per day for the next 5 days Patient not taking: No sig reported 11/09/19   Trifan,  Carola Rhine, MD  metroNIDAZOLE (FLAGYL) 500 MG tablet Take 1 tablet (500 mg total) by mouth 3 (three) times daily. Patient not taking: No sig reported 12/06/18   Virgel Manifold, MD  oxyCODONE-acetaminophen (PERCOCET/ROXICET) 5-325 MG tablet Take 1 tablet by mouth every 4 (four) hours as needed for severe pain. Patient not taking: No sig reported 12/06/18   Virgel Manifold, MD    Allergies    Morphine and related  Review of Systems   Review of Systems  Constitutional: Negative.   HENT: Negative.   Respiratory: Positive for cough and shortness of breath.   Cardiovascular: Positive for chest pain (Right).  Gastrointestinal: Positive for abdominal pain (Epigastric). Negative for blood in stool, constipation, diarrhea, nausea, rectal pain and vomiting.       Chronic perirectal fistula drainage  Genitourinary: Negative.   Musculoskeletal: Negative.   Skin: Negative.   Neurological: Negative.   All other systems reviewed and are negative.   Physical Exam Updated Vital Signs BP (!) 182/100   Pulse 70   Temp 98 F (36.7 C) (Oral)   Resp (!) 26   Ht 6\' 2"  (1.88 m)   Wt 122.5 kg   SpO2 95%   BMI 34.67 kg/m   Physical Exam Vitals and nursing note reviewed.  Constitutional:       General: Fernando [name] not in acute distress.    Appearance: Fernando [name] well-developed and well-nourished. Fernando [name] not ill-appearing, toxic-appearing or diaphoretic.  HENT:     Head: Normocephalic and atraumatic.  Eyes:     Pupils: Pupils are equal, round, and reactive to light.  Cardiovascular:     Rate and Rhythm: Normal rate and regular rhythm.     Pulses: Normal pulses.     Heart sounds: Normal heart sounds.  Pulmonary:     Effort: Pulmonary effort [name] normal. No respiratory distress.     Breath sounds: Normal breath sounds.     Comments: Speaks in full sentences.  Splints with deep breath Abdominal:     General: There [name] no distension.     Palpations: Abdomen [name] soft.     Comments: Diffuse tenderness to the epigastric region  Musculoskeletal:        General: Normal range of motion.     Cervical back: Normal range of motion and neck supple.     Right lower leg: No tenderness. Edema present.     Left lower leg: No tenderness. Edema present.     Comments: Moves all 4 extremities at difficulty.  1+ pitting edema to mid shins. Homans sign negative  Skin:    General: Skin [name] warm and dry.     Capillary Refill: Capillary refill takes less than 2 seconds.  Neurological:     General: No focal deficit present.     Mental Status: Fernando [name] alert.  Psychiatric:        Mood and Affect: Mood and affect normal.     ED Results / Procedures / Treatments   Labs (all labs ordered are listed, but only abnormal results are displayed) Labs Reviewed  COMPREHENSIVE METABOLIC PANEL - Abnormal; Notable for the following components:      Result Value   Potassium 3.0 (*)    Glucose, Bld 115 (*)    Calcium 8.7 (*)    All other components within normal limits  CBC WITH DIFFERENTIAL/PLATELET  LIPASE, BLOOD  TROPONIN I (HIGH SENSITIVITY)  TROPONIN I (HIGH SENSITIVITY)    EKG EKG Interpretation  Date/Time:  Friday March 12 2020 10:10:21 EST Ventricular Rate:  82 PR Interval:    QRS Duration: 100 QT  Interval:  378 QTC Calculation: 442 R Axis:   -26 Text Interpretation: Sinus rhythm Confirmed by Lajean Saver [phone]) on 03/12/2020 10:32:15 AM   Radiology CT Angio Chest PE W/Cm &/Or Wo Cm  Result Date: 03/12/2020 CLINICAL DATA:  Shortness of breath for 1 week, RIGHT-side chest pain, upper abdominal pain, history hypertension, question pulmonary embolism EXAM: CT ANGIOGRAPHY CHEST WITH CONTRAST TECHNIQUE: Multidetector CT imaging of the chest was performed using the standard protocol during bolus administration of intravenous contrast. Multiplanar CT image reconstructions and MIPs were obtained to evaluate the vascular anatomy. CONTRAST:  193mL OMNIPAQUE IOHEXOL 350 MG/ML SOLN IV COMPARISON:  10/11/2016 FINDINGS: Cardiovascular: Mild atherosclerotic calcifications aorta, coronary arteries, proximal great vessels. Aneurysmal dilatation of the ascending thoracic aorta 4.4 cm diameter previously 4.3 cm at same level. Heart unremarkable. No pericardial effusion. Pulmonary arteries adequately opacified and patent. No evidence of pulmonary embolism. Few respiratory motion artifacts. Mediastinum/Nodes: Esophagus unremarkable. Base of cervical region normal appearance. No thoracic adenopathy. Lungs/Pleura: Minimal dependent atelectasis in the posterior lungs. Lungs otherwise clear. No pulmonary infiltrate, pleural effusion, or pneumothorax. Upper Abdomen: Unremarkable Musculoskeletal: Scattered mild endplate spur formation thoracic spine. No acute osseous findings. Review of the MIP images confirms the above findings. IMPRESSION: No evidence of pulmonary embolism. Minimal dependent atelectasis in the posterior lungs. 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 This recommendation follows 2010 ACCF/AHA/AATS/ACR/ASA/SCA/SCAI/SIR/STS/SVM Guidelines for the Diagnosis and Management of Patients with Thoracic Aortic Disease. Circulation. 2010; 121:  X937-J696. Aortic aneurysm NOS (ICD10-I71.9) Aortic Atherosclerosis (ICD10-I70.0). Aortic aneurysm NOS (ICD10-I71.9). Electronically Signed   By: Lavonia Dana M.D.   On: 03/12/2020 13:18   CT Abdomen Pelvis W Contrast  Result Date: 03/12/2020 CLINICAL DATA:  Shortness of breath for 1 week, RIGHT chest and upper abdominal pain, history hypertension, hiatal hernia repair, appendectomy EXAM: CT ABDOMEN AND PELVIS WITH CONTRAST TECHNIQUE: Multidetector CT imaging of the abdomen and pelvis was performed using the standard protocol following bolus administration of intravenous contrast. Sagittal and coronal MPR images reconstructed from axial data set. CONTRAST:  171mL OMNIPAQUE IOHEXOL 350 MG/ML SOLN IV. No oral contrast. COMPARISON:  12/06/2018 FINDINGS: Lower chest: Minimal dependent atelectasis LEFT lower lobe Hepatobiliary: Gallbladder contracted. Liver normal appearance. No biliary dilatation. Pancreas: Normal appearance Spleen: Normal appearance Adrenals/Urinary Tract: Adrenal glands normal appearance. Tiny nonobstructing upper pole LEFT renal calculus without renal mass or hydronephrosis. Tiny nonobstructing upper pole LEFT renal calculus as well as small mid RIGHT renal cyst. Subtle intermediate attenuation mass lesion at medial aspect upper pole RIGHT kidney, 2.6 x 1.8 cm image 33, not seen on previous exam, showing minimal change between the initial and delayed imaging. This could represent a complicated renal cyst or a subtle neoplasm and further evaluation by multiphase MR imaging with and without contrast [name] recommended. No hydronephrosis, hydroureter, or ureteral calcification. Bladder unremarkable. Stomach/Bowel: Stomach and bowel loops normal appearance Vascular/Lymphatic: Tortuous common iliac arteries. Aorta normal caliber. No adenopathy. Reproductive: Enlarged prostate gland including enlargement of central lobe of prostate extending into bladder base, gland measuring 4.8 x 4.2 x 5.5 cm (volume = 58  cm^3). Other: No free air or free fluid. Small LEFT inguinal hernia containing fat. No definite inflammatory process. Musculoskeletal: Grade 1 spondylolisthesis L5-S1 secondary to BILATERAL spondylolysis L5. IMPRESSION: Tiny BILATERAL nonobstructing renal calculi. Subtle intermediate attenuation mass lesion at medial aspect upper pole RIGHT kidney  2.6 x 1.8 cm, not seen on previous exam with questionable washout of contrast on delayed images. This could represent a complicated cyst or a subtle mass lesion and further evaluation by multiphase MR imaging with and without contrast [name] recommended. Enlarged prostate gland. Small LEFT inguinal hernia containing fat. Grade 1 spondylolisthesis L5-S1 secondary to BILATERAL spondylolysis L5. Electronically Signed   By: Lavonia Dana M.D.   On: 03/12/2020 13:26    Procedures Procedures   Medications Ordered in ED Medications  fentaNYL (SUBLIMAZE) injection 50 mcg (50 mcg Intravenous Given 03/12/20 1049)  ondansetron (ZOFRAN) injection 4 mg (4 mg Intravenous Given 03/12/20 1049)  sodium chloride 0.9 % bolus 1,000 mL (0 mLs Intravenous Stopped 03/12/20 1151)  potassium chloride SA (KLOR-CON) CR tablet 40 mEq (40 mEq Oral Given 03/12/20 1151)  iohexol (OMNIPAQUE) 350 MG/ML injection 100 mL (100 mLs Intravenous Contrast Given 03/12/20 1224)  methylPREDNISolone sodium succinate (SOLU-MEDROL) 125 mg/2 mL injection 125 mg (125 mg Intravenous Given 03/12/20 1424)  albuterol (VENTOLIN HFA) 108 (90 Base) MCG/ACT inhaler 2 puff (2 puffs Inhalation Given 03/12/20 1426)    ED Course  I have reviewed the triage vital signs and the nursing notes.  Pertinent labs & imaging results that were available during my care of the patient were reviewed by me and considered in my medical decision making (see chart for details).  Patient here with shortness of breath x1 week.  Right-sided pleuritic chest pain.  Fernando [name] a Administrator.  Fernando [name] tachypneic on exam.  Fernando has clear lung sounds.  Also has  epigastric pain.  Does not radiate.  Exertional chest pain.  Fernando has no midline pulsatile abdominal mass.  Does have no rectal fistula, followed at [facility] surgery.  Has had some drainage.  Was seen by them 2 months ago and they stated this did not seem infectious.  We will plan on labs, imaging and reassess  Labs and imaging personally reviewed and interpreted:  CBC without leukocytosis Metabolic panel, potassium 3.0, given replacement, glucose 115, no additional electrolyte, renal or liver normality Lipase 34 Troponin 9>>10  CTA chest with a sending thoracic aorta at 4.4, need yearly follow-up CT [facility] possible mass versus cyst to right renal pole.  Recommend MRI outpatient for further characterization.   Patient back from CT.  Reevaluation Fernando now has moderate wheeze.  No known history of COPD, asthma however Fernando [name] a tobacco user.  Will give albuterol, steroids and reassess.  Went to reassess patient.   Nursing states patient eloped from the emergency department prior to reevaluation.    MDM Rules/Calculators/A&P                          Fernando [name] was evaluated in Emergency Department on 03/12/2020 for the symptoms described in the history of present illness. Fernando was evaluated in the context of the global COVID-19 pandemic, which necessitated consideration that the patient might be at risk for infection with the SARS-CoV-2 virus that causes COVID-19. Institutional protocols and algorithms that pertain to the evaluation of patients at risk for COVID-19 are in a state of rapid change based on information released by regulatory bodies including the CDC and federal and state organizations. These policies and algorithms were followed during the patient's care in the ED. Final Clinical Impression(s) / ED Diagnoses Final diagnoses:  Ascending aorta enlargement (HCC)  Wheeze  Precordial pain  Renal mass  Hypokalemia    Rx / DC  Orders ED Discharge Orders         Ordered    predniSONE  (DELTASONE) 50 MG tablet  Daily        03/12/20 1417    albuterol (VENTOLIN HFA) 108 (90 Base) MCG/ACT inhaler  Every 6 hours PRN        03/12/20 1417           Sande Pickert A, PA-C 03/12/20 1505    Lajean Saver, MD 03/12/20 1521

## 2020-03-12 NOTE — Discharge Instructions (Addendum)
Your CT scan of your chest showed an enlarged aorta.  This needs a yearly CTA or MRA for follow-up.  The larger this gets the higher you are at risk for this rupturing.  You need to follow-up with your PCP for this.  Your CT scan of your abdomen did show a possible mass versus cyst in your right kidney.  You need outpatient abdominal MRI for further evaluation of this.  Your work-up showed you did have a mildly low potassium.  We supplemented this with oral potassium here, increase potassium rich foods.  I gave him a list at discharge.  You did have some wheezing on exam.  We have prescribed you some steroids as well as albuterol inhaler.  Have close follow-up with primary care provider  Return for any worsening symptoms.

## 2020-03-12 NOTE — ED Triage Notes (Addendum)
Pt presents to ED with complaints of SOB x about 1 week. Pt states anything he does makes it worse. Pt states hurting in upper abdomen and right side of chest.

## 2020-03-12 NOTE — ED Notes (Signed)
Pt transported to CT ?

## 2020-03-19 ENCOUNTER — Other Ambulatory Visit: Payer: Self-pay | Admitting: Physician Assistant

## 2020-03-19 ENCOUNTER — Telehealth (HOSPITAL_COMMUNITY): Payer: Self-pay

## 2020-03-19 ENCOUNTER — Other Ambulatory Visit (HOSPITAL_COMMUNITY): Payer: Self-pay | Admitting: Physician Assistant

## 2020-03-19 DIAGNOSIS — N281 Cyst of kidney, acquired: Secondary | ICD-10-CM

## 2020-03-26 ENCOUNTER — Other Ambulatory Visit (HOSPITAL_COMMUNITY): Payer: Self-pay | Admitting: Physician Assistant

## 2020-03-26 ENCOUNTER — Other Ambulatory Visit: Payer: Self-pay

## 2020-03-26 ENCOUNTER — Ambulatory Visit (HOSPITAL_COMMUNITY)
Admission: RE | Admit: 2020-03-26 | Discharge: 2020-03-26 | Disposition: A | Discharge status: Home / Self Care | Payer: Medicaid Other | Source: Ambulatory Visit | Attending: Physician Assistant | Admitting: Physician Assistant

## 2020-03-26 DIAGNOSIS — N281 Cyst of kidney, acquired: Secondary | ICD-10-CM

## 2020-03-26 MED ORDER — GADOBUTROL 1 MMOL/ML IV SOLN
10.0000 mL | Freq: Once | INTRAVENOUS | Status: AC | PRN
  Administered 2020-03-26: 10 mL via INTRAVENOUS

## 2020-04-02 ENCOUNTER — Ambulatory Visit (HOSPITAL_COMMUNITY): Payer: Medicaid Other

## 2020-04-06 ENCOUNTER — Encounter: Payer: Medicaid Other | Admitting: Thoracic Surgery (Cardiothoracic Vascular Surgery)

## 2020-04-30 ENCOUNTER — Encounter: Payer: Self-pay | Admitting: Urology

## 2020-04-30 ENCOUNTER — Other Ambulatory Visit: Payer: Self-pay

## 2020-04-30 ENCOUNTER — Ambulatory Visit (INDEPENDENT_AMBULATORY_CARE_PROVIDER_SITE_OTHER): Payer: Medicaid Other | Admitting: Urology

## 2020-04-30 VITALS — BP 139/90 | HR 80 | Temp 97.7°F | Ht 73.0 in | Wt 265.0 lb

## 2020-04-30 DIAGNOSIS — N2889 Other specified disorders of kidney and ureter: Secondary | ICD-10-CM

## 2020-04-30 NOTE — Patient Instructions (Signed)

## 2020-04-30 NOTE — Progress Notes (Signed)
04/30/2020 9:26 AM   Fernando [name] 12-15-1960 [id number]  Referring provider: Cory Munch, PA-C [address] [place],  [place] [address]  Right renal mass  HPI: Fernando [name] is a 60yo here for evaluation of a right renal mass. He was having abdominal pain and had a CT on 03/12/2020 that showed a right renal mass. He underwent MRI 03/26/2020 which showed a 2cm medial endophytic right renal mass. He has nocturia which is bothersome. IPSS 9 QOL 3. No hematuria or dysuria. No unexplained weight loss.  I reviewed multiple CTs  starting in 2019 which shows the mass.    PMH: Past Medical History:  Diagnosis Date  . Acute respiratory failure with hypoxia (HCC) 08/27/2015   Mild-Oxygen saturation 88% with ambulation.  . Allergy   . Anal fissure   . 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 Complication of anesthesia    woke up during colonoscopy  . HCAP (healthcare-associated pneumonia) 05/12/2015  . Hypertension 12/30/2011  . Hypothyroidism 08/27/2015   New diagnosis  . Loose stools   . Lung mass    hx of left lower benign mass  . Obstructive sleep apnea 08/27/2015   Per history only; not confirmed with a sleep study yet.  Marland Kitchen Peri-rectal abscess    multiple.   . Pneumonia   . Shortness of breath dyspnea    with exertion  . 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 Tobacco abuse   . Umbilical hernia    has been repaired    Surgical History: Past Surgical History:  Procedure Laterality Date  . ABSCESS DRAINAGE    . APPENDECTOMY    . CERVICAL FUSION    . COLONOSCOPY    . CYSTOSCOPY N/A 05/01/2013   Procedure: CYSTOSCOPY;  Surgeon: Marissa Nestle, MD;  Location: [facility];  Service: Urology;  Laterality: N/A;  . EVALUATION UNDER ANESTHESIA WITH FISTULECTOMY N/A 06/23/2015   Procedure: ANAL EXAM UNDER ANESTHESIA  FISTULOTOMY;  Surgeon: Leighton Ruff, MD;  Location: [place];  Service: General;  Laterality: N/A;  . FACIAL COSMETIC SURGERY    . HERNIA REPAIR     . INCISION AND DRAINAGE ABSCESS Right 05/01/2013   Procedure: INCISION AND DRAINAGE RIGHT SCROTAL ABSCESS;  Surgeon: Marissa Nestle, MD;  Location: [facility];  Service: Urology;  Laterality: Right;  . INCISION AND DRAINAGE PERIRECTAL ABSCESS    . KNEE ARTHROSCOPY    . SPINE SURGERY     cervical  . VIDEO ASSISTED THORACOSCOPY (VATS)/ LOBECTOMY Left 05/03/2015   Procedure: LEFT VIDEO ASSISTED THORACOSCOPY (VATS)/ LEFT LOWER LOBECTOMY, ON-Q CATHETER PLACEMENT;  Surgeon: Melrose Nakayama, MD;  Location: [place];  Service: Thoracic;  Laterality: Left;  Marland Kitchen VIDEO BRONCHOSCOPY WITH ENDOBRONCHIAL ULTRASOUND N/A 04/22/2015   Procedure: VIDEO BRONCHOSCOPY WITH ENDOBRONCHIAL ULTRASOUND;  Surgeon: Melrose Nakayama, MD;  Location: [facility];  Service: Thoracic;  Laterality: N/A;    Home Medications:  Allergies as of 04/30/2020      Reactions   Morphine And Related Other (See Comments)   hallucinations      Medication List       Accurate as of April 30, 2020  9:26 AM. If you have any questions, ask your nurse or doctor.        STOP taking these medications   cephALEXin 500 MG capsule Commonly known as: KEFLEX Stopped by: Nicolette Bang, MD   erythromycin ophthalmic ointment Stopped by: Nicolette Bang, MD   metroNIDAZOLE 500 MG  tablet Commonly known as: FLAGYL Stopped by: Nicolette Bang, MD   oxyCODONE-acetaminophen 5-325 MG tablet Commonly known as: PERCOCET/ROXICET Stopped by: Nicolette Bang, MD     TAKE these medications   albuterol 108 (90 Base) MCG/ACT inhaler Commonly known as: VENTOLIN HFA Inhale 1-2 puffs into the lungs every 6 (six) hours as needed for wheezing or shortness of breath.   ALPRAZolam 1 MG tablet Commonly known as: XANAX Take 1 mg by mouth at bedtime as needed for anxiety. What changed: Another medication with the same name was removed. Continue taking this medication, and follow the directions you see here. Changed by: Nicolette Bang, MD    enalapril 10 MG tablet Commonly known as: VASOTEC Take 10 mg by mouth daily.   ibuprofen 200 MG tablet Commonly known as: ADVIL Take 200 mg by mouth every 6 (six) hours as needed for fever, headache or mild pain.       Allergies:  Allergies  Allergen Reactions  . Morphine And Related Other (See Comments)    hallucinations    Family History: Family History  Problem Relation Age of Onset  . Breast cancer Mother   . Thyroid disease Mother     Social History:  reports that he quit smoking about 5 years ago. His smoking use included cigarettes. He has a 7.50 pack-year smoking history. He has never used smokeless tobacco. He reports current alcohol use. He reports that he does not use drugs.  ROS: All other review of systems were reviewed and are negative except what is noted above in HPI  Physical Exam: BP 139/90   Pulse 80   Temp 97.7 F (36.5 C)   Ht 6\' 1"  (1.854 m)   Wt 265 lb (120.2 kg)   BMI 34.96 kg/m   Constitutional:  Alert and oriented, No acute distress. HEENT:  AT, moist mucus membranes.  Trachea midline, no masses. Cardiovascular: No clubbing, cyanosis, or edema. Respiratory: Normal respiratory effort, no increased work of breathing. GI: Abdomen is soft, nontender, nondistended, no abdominal masses GU: No CVA tenderness.  Lymph: No cervical or inguinal lymphadenopathy. Skin: No rashes, bruises or suspicious lesions. Neurologic: Grossly intact, no focal deficits, moving all 4 extremities. Psychiatric: Normal mood and affect.  Laboratory Data: Lab Results  Component Value Date   WBC 8.2 03/12/2020   HGB 14.5 03/12/2020   HCT 42.0 03/12/2020   MCV 94.2 03/12/2020   PLT 235 03/12/2020    Lab Results  Component Value Date   CREATININE 1.03 03/12/2020    No results found for: PSA  No results found for: TESTOSTERONE  Lab Results  Component Value Date   HGBA1C (H) 08/16/2009    6.3 (NOTE)                                                                        According to the ADA Clinical Practice Recommendations for 2011, when HbA1c is used as a screening test:   >=6.5%   Diagnostic of Diabetes Mellitus           (if abnormal result  is confirmed)  5.7-6.4%   Increased risk of developing Diabetes Mellitus  References:Diagnosis and Classification of Diabetes Mellitus,Diabetes LPFX,9024,09(BDZHG 1):S62-S69 and Standards of Medical Care in  Diabetes - 2011,Diabetes Care,2011,34  (Suppl 1):S11-S61.    Urinalysis    Component Value Date/Time   COLORURINE YELLOW 05/23/2017 1450   APPEARANCEUR HAZY (A) 05/23/2017 1450   LABSPEC 1.023 05/23/2017 1450   PHURINE 5.0 05/23/2017 1450   GLUCOSEU NEGATIVE 05/23/2017 1450   HGBUR NEGATIVE 05/23/2017 1450   BILIRUBINUR NEGATIVE 05/23/2017 1450   KETONESUR NEGATIVE 05/23/2017 1450   PROTEINUR 30 (A) 05/23/2017 1450   UROBILINOGEN 0.2 08/15/2009 0510   NITRITE NEGATIVE 05/23/2017 1450   LEUKOCYTESUR NEGATIVE 05/23/2017 1450    Lab Results  Component Value Date   BACTERIA NONE SEEN 05/23/2017    Pertinent Imaging: MRI 3/18./2022: Images reviewed and discussed with the patient  No results found for this or any previous visit.  Results for orders placed during the hospital encounter of 08/26/15  US Venous Img Lower Bilateral  Narrative CLINICAL DATA:  Bilateral lower extremity edema.  EXAM: BILATERAL LOWER EXTREMITY VENOUS DOPPLER ULTRASOUND  TECHNIQUE: Gray-scale sonography with graded compression, as well as color Doppler and duplex ultrasound were performed to evaluate the lower extremity deep venous systems from the level of the common femoral vein and including the common femoral, femoral, profunda femoral, popliteal and calf veins including the posterior tibial, peroneal and gastrocnemius veins when visible. The superficial great saphenous vein was also interrogated. Spectral Doppler was utilized to evaluate flow at rest and with distal augmentation maneuvers  in the common femoral, femoral and popliteal veins.  COMPARISON:  None.  FINDINGS: RIGHT LOWER EXTREMITY  Common Femoral Vein: No evidence of thrombus. Normal compressibility, respiratory phasicity and response to augmentation.  Saphenofemoral Junction: No evidence of thrombus. Normal compressibility and flow on color Doppler imaging.  Profunda Femoral Vein: No evidence of thrombus. Normal compressibility and flow on color Doppler imaging.  Femoral Vein: No evidence of thrombus. Normal compressibility, respiratory phasicity and response to augmentation.  Popliteal Vein: No evidence of thrombus. Normal compressibility, respiratory phasicity and response to augmentation.  Calf Veins: No evidence of thrombus. Normal compressibility and flow on color Doppler imaging.  Superficial Great Saphenous Vein: No evidence of thrombus. Normal compressibility and flow on color Doppler imaging.  Venous Reflux:  None.  Other Findings: No evidence of superficial thrombophlebitis or abnormal fluid collection.  LEFT LOWER EXTREMITY  Common Femoral Vein: No evidence of thrombus. Normal compressibility, respiratory phasicity and response to augmentation.  Saphenofemoral Junction: No evidence of thrombus. Normal compressibility and flow on color Doppler imaging.  Profunda Femoral Vein: No evidence of thrombus. Normal compressibility and flow on color Doppler imaging.  Femoral Vein: No evidence of thrombus. Normal compressibility, respiratory phasicity and response to augmentation.  Popliteal Vein: No evidence of thrombus. Normal compressibility, respiratory phasicity and response to augmentation.  Calf Veins: No evidence of thrombus. Normal compressibility and flow on color Doppler imaging.  Superficial Great Saphenous Vein: No evidence of thrombus. Normal compressibility and flow on color Doppler imaging.  Venous Reflux:  None.  Other Findings: No evidence of superficial  thrombophlebitis or abnormal fluid collection.  IMPRESSION: No evidence of bilateral lower extremity deep venous thrombosis.   Electronically Signed By: Aletta Edouard M.D. On: 08/27/2015 09:11  No results found for this or any previous visit.  No results found for this or any previous visit.  No results found for this or any previous visit.  No results found for this or any previous visit.  No results found for this or any previous visit.  No results found for this or any previous visit.  Assessment & Plan:    1. Kidney mass -We discussed the natural hx of renal masses and the 80/20 malignant/benign likelihood. We disucssed the treatment options including active surveillance. Renal ablation, partial and radical nephrectomy. Since the mass is small and it is in a difficult location for partial nephrectomy we have elected to proceed with surveillance. RTC 6 month with MRI.   - Urinalysis, Routine w reflex microscopic   No follow-ups on file.  Nicolette Bang, MD  [facility] [place]

## 2020-04-30 NOTE — Progress Notes (Signed)
Urological Symptom Review  Patient is experiencing the following symptoms: Frequent urination Get up at night to urinate  Hard to postpone urination   Review of Systems  Gastrointestinal (upper)  : Negative for upper GI symptoms  Gastrointestinal (lower) : Diarrhea  Constitutional : Night Sweats  Skin: Negative for skin symptoms  Eyes: Negative for eye symptoms  Ear/Nose/Throat : Sinus problems  Hematologic/Lymphatic: Negative for Hematologic/Lymphatic symptoms  Cardiovascular : Leg swelling  Respiratory : Negative for respiratory symptoms  Endocrine: Negative for endocrine symptoms  Musculoskeletal: Back pain  Neurological: Negative for neurological symptoms  Psychologic: Negative for psychiatric symptoms

## 2020-05-18 ENCOUNTER — Institutional Professional Consult (permissible substitution): Payer: Medicaid Other | Admitting: Thoracic Surgery (Cardiothoracic Vascular Surgery)

## 2020-05-18 ENCOUNTER — Encounter: Payer: Self-pay | Admitting: Thoracic Surgery (Cardiothoracic Vascular Surgery)

## 2020-05-18 ENCOUNTER — Other Ambulatory Visit: Payer: Self-pay

## 2020-05-18 DIAGNOSIS — I712 Thoracic aortic aneurysm, without rupture, unspecified: Secondary | ICD-10-CM | POA: Insufficient documentation

## 2020-05-18 MED ORDER — METOPROLOL SUCCINATE ER 25 MG PO TB24: 25.0000 mg | ORAL_TABLET | Freq: Every day | ORAL | 6 refills | Status: DC

## 2020-05-18 NOTE — Progress Notes (Signed)
PCP is Redmond School, MD Referring Provider is Sharilyn Sites, MD  Chief Complaint  Patient presents with  . Thoracic Aortic Aneurysm    With CTA chest 03/12/20    HPI: Fernando [name] is sent for consultation regarding an ascending aneurysm  Fernando [name] is a 60 year old man with a past medical history significant for hypertension, hypothyroidism, tobacco abuse, left lower lobectomy (granulomatous disease), obstructive sleep apnea, and pneumonia.  Fernando [name] recently presented with right subcostal pain and some shortness of breath.  As part of his work-up Fernando [name] had a CT of the chest, abdomen and pelvis.  Fernando [name] was found to have a 2.6 x 1.8 cm intermediate attenuation lesion in the right kidney.  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  There were postoperative changes in the lungs but otherwise unremarkable.  No evidence of PE.  Fernando [name] is very anxious about this diagnosis.  Fernando [name] had a grandfather that died of a ruptured abdominal aneurysm in 1968.  Fernando [name] has been through a lot of stress with his family with one of his sons causing him a great deal of anxiety.   Past Medical History:  Diagnosis Date  . Acute respiratory failure with hypoxia (HCC) 08/27/2015   Mild-Oxygen saturation 88% with ambulation.  . Allergy   . Anal fissure   . 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 Complication of anesthesia    woke up during colonoscopy  . HCAP (healthcare-associated pneumonia) 05/12/2015  . Hypertension 12/30/2011  . Hypothyroidism 08/27/2015   New diagnosis  . Loose stools   . Lung mass    hx of left lower benign mass  . Obstructive sleep apnea 08/27/2015   Per history only; not confirmed with a sleep study yet.  Fernando [name] Kitchen Peri-rectal abscess    multiple.   . Pneumonia   . Shortness of breath dyspnea    with exertion  . 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 Tobacco abuse   . Umbilical hernia    has been repaired    Past Surgical History:  Procedure Laterality Date  . ABSCESS DRAINAGE    . APPENDECTOMY    . CERVICAL  FUSION    . COLONOSCOPY    . CYSTOSCOPY N/A 05/01/2013   Procedure: CYSTOSCOPY;  Surgeon: Marissa Nestle, MD;  Location: [facility];  Service: Urology;  Laterality: N/A;  . EVALUATION UNDER ANESTHESIA WITH FISTULECTOMY N/A 06/23/2015   Procedure: ANAL EXAM UNDER ANESTHESIA  FISTULOTOMY;  Surgeon: Leighton Ruff, MD;  Location: [place];  Service: General;  Laterality: N/A;  . FACIAL COSMETIC SURGERY    . HERNIA REPAIR    . INCISION AND DRAINAGE ABSCESS Right 05/01/2013   Procedure: INCISION AND DRAINAGE RIGHT SCROTAL ABSCESS;  Surgeon: Marissa Nestle, MD;  Location: [facility];  Service: Urology;  Laterality: Right;  . INCISION AND DRAINAGE PERIRECTAL ABSCESS    . KNEE ARTHROSCOPY    . SPINE SURGERY     cervical  . VIDEO ASSISTED THORACOSCOPY (VATS)/ LOBECTOMY Left 05/03/2015   Procedure: LEFT VIDEO ASSISTED THORACOSCOPY (VATS)/ LEFT LOWER LOBECTOMY, ON-Q CATHETER PLACEMENT;  Surgeon: Melrose Nakayama, MD;  Location: [place];  Service: Thoracic;  Laterality: Left;  Fernando [name] Kitchen VIDEO BRONCHOSCOPY WITH ENDOBRONCHIAL ULTRASOUND N/A 04/22/2015   Procedure: VIDEO BRONCHOSCOPY WITH ENDOBRONCHIAL ULTRASOUND;  Surgeon: Melrose Nakayama, MD;  Location: [facility];  Service: Thoracic;  Laterality: N/A;    Family History  Problem Relation Age of Onset  . Breast cancer Mother   . Thyroid disease  Mother     Social History Social History   Tobacco Use  . Smoking status: Former Smoker    Packs/day: 0.50    Years: 15.00    Pack years: 7.50    Types: Cigarettes    Quit date: 03/05/2015    Years since quitting: 5.2  . Smokeless tobacco: Never Used  Vaping Use  . Vaping Use: Never used  Substance Use Topics  . Alcohol use: Yes    Alcohol/week: 0.0 standard drinks  . Drug use: No    Current Outpatient Medications  Medication Sig Dispense Refill  . albuterol (VENTOLIN HFA) 108 (90 Base) MCG/ACT inhaler Inhale 1-2 puffs into the lungs every 6 (six) hours as needed for wheezing or shortness  of breath. 8 g 0  . ALPRAZolam (XANAX) 1 MG tablet Take 1 mg by mouth at bedtime as needed for anxiety.    . enalapril (VASOTEC) 10 MG tablet Take 10 mg by mouth daily.    Fernando [name] Kitchen ibuprofen (ADVIL) 200 MG tablet Take 200 mg by mouth every 6 (six) hours as needed for fever, headache or mild pain.    . metoprolol succinate (TOPROL XL) 25 MG 24 hr tablet Take 1 tablet (25 mg total) by mouth daily. 30 tablet 6   No current facility-administered medications for this visit.    Allergies  Allergen Reactions  . Morphine And Related Other (See Comments)    hallucinations    Review of Systems  Constitutional: Negative for activity change and appetite change.  HENT: Negative for trouble swallowing and voice change.   Eyes: Negative for visual disturbance.  Respiratory: Positive for shortness of breath. Negative for chest tightness.   Cardiovascular: Negative for chest pain.  Gastrointestinal: Positive for abdominal pain.  Genitourinary: Negative for difficulty urinating and dysuria.       Kidney mass seen on CT  All other systems reviewed and are negative.   BP (!) 147/110 (BP Location: Right Arm, Patient Position: Sitting)   Pulse 61   Ht 6\' 1"  (1.854 m)   Wt 265 lb (120.2 kg)   SpO2 96% Comment: RA  BMI 34.96 kg/m  Physical Exam Vitals reviewed.  Constitutional:      General: Fernando [name] is not in acute distress.    Appearance: Normal appearance.  HENT:     Head: Normocephalic and atraumatic.  Eyes:     General: No scleral icterus.    Extraocular Movements: Extraocular movements intact.  Cardiovascular:     Rate and Rhythm: Normal rate and regular rhythm.     Heart sounds: Normal heart sounds. No murmur heard. No friction rub. No gallop.   Pulmonary:     Effort: Pulmonary effort is normal. No respiratory distress.     Breath sounds: Normal breath sounds. No wheezing or rales.  Abdominal:     General: There is no distension.     Palpations: Abdomen is soft.  Musculoskeletal:         General: No swelling.  Skin:    General: Skin is warm and dry.  Neurological:     General: No focal deficit present.     Mental Status: Fernando [name] is alert and oriented to person, place, and time.     Cranial Nerves: No cranial nerve deficit.     Motor: No weakness.  Psychiatric:     Comments: Anxious, talks rapidly    Diagnostic Tests: CT ANGIOGRAPHY CHEST WITH CONTRAST  TECHNIQUE: Multidetector CT imaging of the chest was performed using the standard protocol during  bolus administration of intravenous contrast. Multiplanar CT image reconstructions and MIPs were obtained to evaluate the vascular anatomy.  CONTRAST:  130mL OMNIPAQUE IOHEXOL 350 MG/ML SOLN IV  COMPARISON:  10/11/2016  FINDINGS: Cardiovascular: Mild atherosclerotic calcifications aorta, coronary arteries, proximal great vessels. Aneurysmal dilatation of the ascending thoracic aorta 4.4 cm diameter previously 4.3 cm at same level. Heart unremarkable. No pericardial effusion. Pulmonary arteries adequately opacified and patent. No evidence of pulmonary embolism. Few respiratory motion artifacts.  Mediastinum/Nodes: Esophagus unremarkable. Base of cervical region normal appearance. No thoracic adenopathy.  Lungs/Pleura: Minimal dependent atelectasis in the posterior lungs. Lungs otherwise clear. No pulmonary infiltrate, pleural effusion, or pneumothorax.  Upper Abdomen: Unremarkable  Musculoskeletal: Scattered mild endplate spur formation thoracic spine. No acute osseous findings.  Review of the MIP images confirms the above findings.  IMPRESSION: No evidence of pulmonary embolism.  Minimal dependent atelectasis in the posterior lungs.  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  This recommendation follows 2010 ACCF/AHA/AATS/ACR/ASA/SCA/SCAI/SIR/STS/SVM Guidelines for the Diagnosis and Management of Patients with Thoracic  Aortic Disease. Circulation. 2010; 121: R518-A416. Aortic aneurysm NOS (ICD10-I71.9)  Aortic Atherosclerosis (ICD10-I70.0).  Aortic aneurysm NOS (ICD10-I71.9).   Electronically Signed   By: Lavonia Dana M.D.   On: 03/12/2020 13:18 I personally reviewed the CT images.  There is a 4.4 cm ascending aneurysm without complicating features.  Impression: Zahki Hoogendoorn is a 60 year old man with a past medical history significant for hypertension, hypothyroidism, tobacco abuse, left lower lobectomy (granulomatous disease), obstructive sleep apnea, and pneumonia.  Fernando [name] recently was evaluated for shortness of breath and right subcostal pain.  Fernando [name] was found to have a 2.6 x 1.8cm renal mass and a 4.4 cm ascending aneurysm.  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  Asymptomatic.  Blood pressure control is the mainstay of treatment.  Needs annual follow-up.  Indications for surgery would be the increase of 5 or more mm between scans or size of 5.5 cm.  Importance of blood pressure control was emphasized.  Hypertension-blood pressure 147/110 today.  Fernando [name] is on enalapril 10 mg daily.  I am going to add a beta-blocker with Toprol-XL 25 mg p.o. daily.  I did instruct him to buy blood pressure cuff and check himself on a regular basis at home.  Renal mass-plan is observation with a repeat scan in 6 months.  If Fernando [name] were to need surgery for that, it can be done safely in the presence of a 4.4 cm ascending aneurysm.  Plan: Toprol-XL 25 mg p.o. daily Continue enalapril Check blood pressure at home Return in 1 year with CT angiogram of chest  Melrose Nakayama, MD [facility] [phone]

## 2020-06-08 ENCOUNTER — Telehealth: Payer: Self-pay

## 2020-06-08 NOTE — Telephone Encounter (Signed)
Please review last OV and advise

## 2020-06-08 NOTE — Telephone Encounter (Signed)
Patient came by office to let Dr. 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  The pain is very bad and cannot work. He would like to make his surgery soon as possible and not wait until Oct.  Please advise.  Call back: [phone]  Thanks, Helene Kelp

## 2020-06-09 ENCOUNTER — Other Ambulatory Visit: Payer: Self-pay

## 2020-06-09 DIAGNOSIS — N2889 Other specified disorders of kidney and ureter: Secondary | ICD-10-CM

## 2020-06-09 NOTE — Telephone Encounter (Signed)
Order received for MRI abd w w/o contrast stat for increase in pain.

## 2020-06-10 ENCOUNTER — Ambulatory Visit (HOSPITAL_COMMUNITY)
Admission: RE | Admit: 2020-06-10 | Discharge: 2020-06-10 | Disposition: A | Discharge status: Home / Self Care | Payer: Medicaid Other | Source: Ambulatory Visit | Attending: Urology | Admitting: Urology

## 2020-06-10 ENCOUNTER — Other Ambulatory Visit: Payer: Self-pay

## 2020-06-10 DIAGNOSIS — N2889 Other specified disorders of kidney and ureter: Secondary | ICD-10-CM | POA: Diagnosis present

## 2020-06-10 MED ORDER — GADOBUTROL 1 MMOL/ML IV SOLN
10.0000 mL | Freq: Once | INTRAVENOUS | Status: AC | PRN
  Administered 2020-06-10: 10 mL via INTRAVENOUS

## 2020-06-11 NOTE — Telephone Encounter (Signed)
Please review MRI.

## 2020-06-11 NOTE — Telephone Encounter (Signed)
Patient calling for results from MRI.  Please call patient back asap with results.  Call back:  [phone] Jerilynn Mages)  Thanks, Helene Kelp

## 2020-06-14 NOTE — Telephone Encounter (Signed)
Patient called and left a message that he is frustrated and still hurting. He wants to know what the results are of his MRI that was from 06/10/20. He reports that he will not be calling back anymore. He reports that he does not want to wait 6 more months for another MRI. He reports that Dr. Roxan Hockey wants him to have this kidney looked at ASAP. He reports that he would like to get rid of the pain and he wants to get some surgery or something sooner rather than later for some relief. He thinks that he would be better off with one Kidney and then work on his  Heart disease.   Please advise.

## 2020-06-16 ENCOUNTER — Telehealth: Payer: Self-pay

## 2020-06-16 NOTE — Telephone Encounter (Signed)
-----   Message from Cleon Gustin, MD sent at 06/16/2020  2:52 PM EDT ----- His tumor has not changed in size. It is not the cause of his pain ----- Message ----- From: Dorisann Frames, RN Sent: 06/10/2020   1:37 PM EDT To: Cleon Gustin, MD  Please review

## 2020-06-16 NOTE — Telephone Encounter (Signed)
Patient notified of results and says he has a f/u with [facility].

## 2020-06-16 NOTE — Telephone Encounter (Signed)
Opened in error

## 2020-06-16 NOTE — Telephone Encounter (Signed)
Patient returned call still concerned about lower back pain.  Message sent to MD

## 2020-10-29 ENCOUNTER — Ambulatory Visit (HOSPITAL_COMMUNITY): Payer: Medicaid Other

## 2020-11-05 ENCOUNTER — Ambulatory Visit: Payer: Medicaid Other | Admitting: Urology

## 2021-01-14 ENCOUNTER — Other Ambulatory Visit: Payer: Self-pay

## 2021-01-14 DIAGNOSIS — I10 Essential (primary) hypertension: Secondary | ICD-10-CM

## 2021-01-14 MED ORDER — METOPROLOL SUCCINATE ER 25 MG PO TB24: 25.0000 mg | ORAL_TABLET | Freq: Every day | ORAL | 6 refills | Status: DC

## 2021-04-19 ENCOUNTER — Other Ambulatory Visit: Payer: Self-pay | Admitting: *Deleted

## 2021-04-19 DIAGNOSIS — I7121 Aneurysm of the ascending aorta, without rupture: Secondary | ICD-10-CM

## 2021-05-24 ENCOUNTER — Inpatient Hospital Stay: Admission: RE | Admit: 2021-05-24 | Payer: Medicaid Other | Source: Ambulatory Visit

## 2021-05-24 NOTE — Progress Notes (Deleted)
      [phone]       [place] [address]             [phone]       HPI: Mr. Fernando [name] is a 61 year old male known to our practice with a history of hypertension, COPD, former tobacco abuse, pulmonary nodule, status post lobectomy with pathology consistent with granuloma, obstructive sleep apnea, and renal cell carcinoma status post robotic partial nephrectomy last year.  During the course of work-up for his renal cell carcinoma, he was found to have a 4.4 cm thoracic aortic aneurysm.  He was seen by Dr. Roxan Hockey for this last year.  He was mildly hypertensive at the time and was started on Toprol-XL 25 mg daily.  He returns today for scheduled 24-monthfollow-up per protocol.   Current Outpatient Medications  Medication Sig Dispense Refill   albuterol (VENTOLIN HFA) 108 (90 Base) MCG/ACT inhaler Inhale 1-2 puffs into the lungs every 6 (six) hours as needed for wheezing or shortness of breath. 8 g 0   ALPRAZolam (XANAX) 1 MG tablet Take 1 mg by mouth at bedtime as needed for anxiety.     enalapril (VASOTEC) 10 MG tablet Take 10 mg by mouth daily.     ibuprofen (ADVIL) 200 MG tablet Take 200 mg by mouth every 6 (six) hours as needed for fever, headache or mild pain.     metoprolol succinate (TOPROL XL) 25 MG 24 hr tablet Take 1 tablet (25 mg total) by mouth daily. 30 tablet 6   No current facility-administered medications for this visit.    Physical Exam: ***  Diagnostic Tests: ***  Impression: ***  Plan: ***   MAntony Odea PA-C [facility] [phone]

## 2021-05-25 ENCOUNTER — Encounter: Payer: Self-pay | Admitting: Thoracic Surgery (Cardiothoracic Vascular Surgery)

## 2021-06-13 ENCOUNTER — Emergency Department: Admit: 2021-06-13 | Payer: Medicaid - Out of State | Primary: Family Medicine

## 2021-06-13 ENCOUNTER — Inpatient Hospital Stay
Admit: 2021-06-13 | Discharge: 2021-06-14 | Discharge status: Against Medical Advice | Payer: Medicaid - Out of State | Attending: Physician Assistant | Admitting: Physician Assistant

## 2021-06-13 ENCOUNTER — Emergency Department: Payer: Medicaid - Out of State | Primary: Family Medicine

## 2021-06-13 DIAGNOSIS — K922 Gastrointestinal hemorrhage, unspecified: Principal | ICD-10-CM

## 2021-06-13 DIAGNOSIS — R197 Diarrhea, unspecified: Secondary | ICD-10-CM

## 2021-06-13 LAB — CBC WITH AUTO DIFFERENTIAL
Basophils %: 1 % (ref 0.0–3.0)
Basophils %: 1 % (ref 0.0–3.0)
Basophils Absolute: 0.1 10*3/uL (ref 0.0–0.4)
Basophils Absolute: 0 10*3/uL (ref 0.0–0.4)
Eosinophils %: 4 % (ref 0.0–7.0)
Eosinophils %: 4 % (ref 0.0–7.0)
Eosinophils Absolute: 0.3 10*3/uL (ref 0.0–1.0)
Eosinophils Absolute: 0.3 10*3/uL (ref 0.0–1.0)
Granulocyte Absolute Count: 0.1 10*3/uL (ref 0.0–0.17)
Granulocyte Absolute Count: 0 10*3/uL (ref 0.0–0.17)
Hematocrit: 38.5 % — ABNORMAL LOW (ref 42.0–52.0)
Hematocrit: 38.6 % — ABNORMAL LOW (ref 42.0–52.0)
Hemoglobin: 13.8 g/dL — ABNORMAL LOW (ref 14.0–18.0)
Hemoglobin: 12.9 g/dL — ABNORMAL LOW (ref 14.0–18.0)
Immature Granulocytes: 1 % — ABNORMAL HIGH (ref 0–0.5)
Immature Granulocytes: 1 % — ABNORMAL HIGH (ref 0–0.5)
Lymphocytes %: 19 % (ref 18.0–40.0)
Lymphocytes %: 26 % (ref 18.0–40.0)
Lymphocytes Absolute: 1.4 10*3/uL (ref 0.9–4.2)
Lymphocytes Absolute: 2 10*3/uL (ref 0.9–4.2)
MCH: 33.5 PG (ref 27.0–35.0)
MCH: 32.9 PG (ref 27.0–35.0)
MCHC: 35.8 g/dL (ref 30.7–37.3)
MCHC: 33.4 g/dL (ref 30.7–37.3)
MCV: 93.4 FL (ref 81.0–94.0)
MCV: 98.5 FL — ABNORMAL HIGH (ref 81.0–94.0)
MPV: 10.2 FL (ref 9.2–11.8)
MPV: 9.7 FL (ref 9.2–11.8)
Monocytes %: 9 % (ref 2.0–12.0)
Monocytes %: 8 % (ref 2.0–12.0)
Monocytes Absolute: 0.6 10*3/uL (ref 0.1–1.7)
Monocytes Absolute: 0.6 10*3/uL (ref 0.1–1.7)
NRBC Absolute: 0 10*3/uL (ref 0.0–0.01)
NRBC Absolute: 0 10*3/uL (ref 0.0–0.01)
Neutrophils %: 66 % (ref 48.0–72.0)
Neutrophils %: 61 % (ref 48.0–72.0)
Neutrophils Absolute: 4.8 10*3/uL (ref 2.3–7.6)
Neutrophils Absolute: 4.6 10*3/uL (ref 2.3–7.6)
Nucleated RBCs: 0 PER 100 WBC
Nucleated RBCs: 0 PER 100 WBC
Platelets: 271 10*3/uL (ref 130–400)
Platelets: 234 10*3/uL (ref 130–400)
RBC: 4.12 M/uL — ABNORMAL LOW (ref 4.70–6.00)
RBC: 3.92 M/uL — ABNORMAL LOW (ref 4.70–6.00)
RDW: 13.7 % (ref 11.5–14.0)
RDW: 13.6 % (ref 11.5–14.0)
WBC: 7.2 10*3/uL (ref 4.8–10.6)
WBC: 7.6 10*3/uL (ref 4.8–10.6)

## 2021-06-13 LAB — COMPREHENSIVE METABOLIC PANEL
ALT: 24 U/L (ref 10–49)
AST: 31 U/L (ref 0–33.9)
Albumin/Globulin Ratio: 1 (ref 0.7–2.8)
Albumin: 3.8 g/dL (ref 3.48–5.0)
Alkaline Phosphatase: 98 U/L (ref 46–116)
Anion Gap: 13 mmol/L (ref 10–20)
BUN: 10 mg/dL (ref 9–23)
CO2: 23 mmol/L (ref 20.0–31.0)
Calcium: 8.6 mg/dL — ABNORMAL LOW (ref 8.7–10.4)
Chloride: 103 mmol/L (ref 98–107)
Creatinine: 0.98 mg/dL (ref 0.70–1.30)
EGFR IF NonAfrican American: >60 mL/min/{1.73_m2} (ref >60)
GFR African American: >60 mL/min/{1.73_m2} (ref >60)
Globulin: 3.7 g/dL (ref 1.7–4.7)
Glucose: 99 mg/dL (ref 74–106)
Potassium: 4.3 mmol/L (ref 3.4–5.1)
Sodium: 135 mmol/L — ABNORMAL LOW (ref 136–145)
Total Bilirubin: 0.5 mg/dL (ref 0.3–1.2)
Total Protein: 7.5 g/dL (ref 5.7–8.2)

## 2021-06-13 LAB — URINALYSIS W/ RFLX MICROSCOPIC
Bilirubin, Urine: NEGATIVE
Bilirubin: NEGATIVE
Blood, Urine: NEGATIVE
Blood: NEGATIVE
Glucose, Ur: NEGATIVE mg/dL
Glucose: NEGATIVE mg/dL
Ketone: NEGATIVE mg/dL
Ketones, Urine: NEGATIVE mg/dL
Leukocyte Esterase, Urine: NEGATIVE
Leukocyte Esterase: NEGATIVE
Nitrite, Urine: NEGATIVE
Nitrites: NEGATIVE
Protein, UA: NEGATIVE mg/dL
Protein: NEGATIVE mg/dL
Specific Gravity, UA: 1.011 (ref 1.005–1.030)
Specific gravity: 1.011 (ref 1.005–1.030)
Urobilinogen, UA, POCT: 0.2 EU/dL (ref 0.2–1.0)
Urobilinogen: 0.2 EU/dL (ref 0.2–1.0)
pH (UA): 5.5 (ref 4.6–8.0)
pH, UA: 5.5 (ref 4.6–8.0)

## 2021-06-13 LAB — SARS-COV-2 RNA,POC
SARS-COV-2 RNA POC: NEGATIVE
SARS-COV-2 RNA, POC: NEGATIVE

## 2021-06-13 LAB — PROTIME-INR
INR: 1
Protime: 10.1 s (ref 9.50–12.10)

## 2021-06-13 LAB — APTT: aPTT: 26 s (ref 23.90–30.70)

## 2021-06-13 LAB — LIPASE
Lipase: 30 U/L (ref 12–53)
Lipase: 30 U/L (ref 12–53)

## 2021-06-13 LAB — CBC WITH AUTOMATED DIFF
ABS. BASOPHILS: 0.1 10*3/uL (ref 0.0–0.4)
ABS. BASOPHILS: 0 10*3/uL (ref 0.0–0.4)
ABS. EOSINOPHILS: 0.3 10*3/uL (ref 0.0–1.0)
ABS. EOSINOPHILS: 0.3 10*3/uL (ref 0.0–1.0)
ABS. IMM. GRANS.: 0.1 10*3/uL (ref 0.0–0.17)
ABS. IMM. GRANS.: 0 10*3/uL (ref 0.0–0.17)
ABS. LYMPHOCYTES: 1.4 10*3/uL (ref 0.9–4.2)
ABS. LYMPHOCYTES: 2 10*3/uL (ref 0.9–4.2)
ABS. MONOCYTES: 0.6 10*3/uL (ref 0.1–1.7)
ABS. MONOCYTES: 0.6 10*3/uL (ref 0.1–1.7)
ABS. NEUTROPHILS: 4.8 10*3/uL (ref 2.3–7.6)
ABS. NEUTROPHILS: 4.6 10*3/uL (ref 2.3–7.6)
ABSOLUTE NRBC: 0 10*3/uL (ref 0.0–0.01)
ABSOLUTE NRBC: 0 10*3/uL (ref 0.0–0.01)
BASOPHILS: 1 % (ref 0.0–3.0)
BASOPHILS: 1 % (ref 0.0–3.0)
EOSINOPHILS: 4 % (ref 0.0–7.0)
HCT: 38.5 % — ABNORMAL LOW (ref 42.0–52.0)
HCT: 38.6 % — ABNORMAL LOW (ref 42.0–52.0)
HGB: 13.8 g/dL — ABNORMAL LOW (ref 14.0–18.0)
HGB: 12.9 g/dL — ABNORMAL LOW (ref 14.0–18.0)
IMMATURE GRANULOCYTES: 1 % — ABNORMAL HIGH (ref 0–0.5)
IMMATURE GRANULOCYTES: 1 % — ABNORMAL HIGH (ref 0–0.5)
LYMPHOCYTES: 19 % (ref 18.0–40.0)
LYMPHOCYTES: 26 % (ref 18.0–40.0)
MCH: 33.5 PG (ref 27.0–35.0)
MCH: 32.9 PG (ref 27.0–35.0)
MCHC: 35.8 g/dL (ref 30.7–37.3)
MCHC: 33.4 g/dL (ref 30.7–37.3)
MCV: 93.4 FL (ref 81.0–94.0)
MCV: 98.5 FL — ABNORMAL HIGH (ref 81.0–94.0)
MONOCYTES: 9 % (ref 2.0–12.0)
MONOCYTES: 8 % (ref 2.0–12.0)
MPV: 10.2 FL (ref 9.2–11.8)
MPV: 9.7 FL (ref 9.2–11.8)
NEUTROPHILS: 66 % (ref 48.0–72.0)
NEUTROPHILS: 61 % (ref 48.0–72.0)
NRBC: 0 PER 100 WBC
NRBC: 0 PER 100 WBC
PLATELET: 271 10*3/uL (ref 130–400)
PLATELET: 234 10*3/uL (ref 130–400)
RBC: 4.12 M/uL — ABNORMAL LOW (ref 4.70–6.00)
RBC: 3.92 M/uL — ABNORMAL LOW (ref 4.70–6.00)
RDW: 13.7 % (ref 11.5–14.0)
RDW: 13.6 % (ref 11.5–14.0)
WBC: 7.2 10*3/uL (ref 4.8–10.6)
WBC: 7.6 10*3/uL (ref 4.8–10.6)

## 2021-06-13 LAB — METABOLIC PANEL, COMPREHENSIVE
A-G Ratio: 1 (ref 0.7–2.8)
ALT (SGPT): 24 U/L (ref 10–49)
AST (SGOT): 31 U/L (ref 0–33.9)
Albumin: 3.8 g/dL (ref 3.48–5.0)
Alk. phosphatase: 98 U/L (ref 46–116)
Anion gap: 13 mmol/L (ref 10–20)
BUN: 10 mg/dL (ref 9–23)
Bilirubin, total: 0.5 mg/dL (ref 0.3–1.2)
CO2: 23 mmol/L (ref 20.0–31.0)
Calcium: 8.6 mg/dL — ABNORMAL LOW (ref 8.7–10.4)
Chloride: 103 mmol/L (ref 98–107)
Creatinine: 0.98 mg/dL (ref 0.70–1.30)
GFR est AA: >60 mL/min/{1.73_m2} (ref >60)
GFR est non-AA: >60 mL/min/{1.73_m2} (ref >60)
Globulin: 3.7 g/dL (ref 1.7–4.7)
Glucose: 99 mg/dL (ref 74–106)
Potassium: 4.3 mmol/L (ref 3.4–5.1)
Protein, total: 7.5 g/dL (ref 5.7–8.2)
Sodium: 135 mmol/L — ABNORMAL LOW (ref 136–145)

## 2021-06-13 LAB — EKG, 12 LEAD, INITIAL
Calculated P Axis: 34 degrees
Calculated T Axis: 25 degrees
P-R Interval: 194 ms
QRS Duration: 92 ms
QTC Calculation (Bezet): 445 ms

## 2021-06-13 LAB — PROTHROMBIN TIME + INR
INR: 1
Prothrombin time: 10.1 s (ref 9.50–12.10)

## 2021-06-13 LAB — PTT: aPTT: 26 s (ref 23.90–30.70)

## 2021-06-13 MED ORDER — METRONIDAZOLE IN SODIUM CHLORIDE (ISO-OSM) 500 MG/100 ML IV PIGGY BACK
500 mg/100 mL | INTRAVENOUS | Status: AC
Start: 2021-06-13 — End: 2021-06-13
  Administered 2021-06-13: 23:00:00 via INTRAVENOUS

## 2021-06-13 MED ORDER — IOPAMIDOL 61 % IV SOLN
300 mg iodine /mL (61 %) | Freq: Once | INTRAVENOUS | Status: AC
Start: 2021-06-13 — End: 2021-06-14

## 2021-06-13 MED ORDER — ACETAMINOPHEN 1,000 MG/100 ML (10 MG/ML) IV
1000 mg/100 mL (10 mg/mL) | Freq: Four times a day (QID) | INTRAVENOUS | Status: AC | PRN
Start: 2021-06-13 — End: 2021-06-14

## 2021-06-13 MED ORDER — SODIUM CHLORIDE 0.9 % IV
INTRAVENOUS | Status: DC
Start: 2021-06-13 — End: 2021-06-13
  Administered 2021-06-13: 17:00:00 via INTRAVENOUS

## 2021-06-13 MED ORDER — SODIUM CHLORIDE 0.9 % IV
Freq: Once | INTRAVENOUS | Status: AC
Start: 2021-06-13 — End: 2021-06-13
  Administered 2021-06-14: 01:00:00 via INTRAVENOUS

## 2021-06-13 MED ORDER — DIATRIZOATE MEGLUMINE & SODIUM 66 %-10 % ORAL SOLN
66-10 % | Freq: Once | ORAL | Status: AC
Start: 2021-06-13 — End: 2021-06-13
  Administered 2021-06-13: 17:00:00 via ORAL

## 2021-06-13 MED ORDER — SODIUM CHLORIDE 0.9 % IV PIGGY BACK
1 gram | INTRAVENOUS | Status: AC
Start: 2021-06-13 — End: 2021-06-13
  Administered 2021-06-13: 23:00:00 via INTRAVENOUS

## 2021-06-13 MED ORDER — ACETAMINOPHEN 1,000 MG/100 ML (10 MG/ML) IV
1000 mg/100 mL (10 mg/mL) | Freq: Once | INTRAVENOUS | Status: AC
Start: 2021-06-13 — End: 2021-06-13
  Administered 2021-06-13: 17:00:00 via INTRAVENOUS

## 2021-06-13 MED FILL — GASTROGRAFIN 66 %-10 % ORAL SOLUTION: 66-10 % | ORAL | Qty: 30

## 2021-06-13 MED FILL — SODIUM CHLORIDE 0.9 % IV: INTRAVENOUS | Qty: 1000

## 2021-06-13 MED FILL — CEFTRIAXONE 1 GRAM SOLUTION FOR INJECTION: 1 gram | INTRAMUSCULAR | Qty: 1

## 2021-06-13 MED FILL — METRONIDAZOLE IN SODIUM CHLORIDE (ISO-OSM) 500 MG/100 ML IV PIGGY BACK: 500 mg/100 mL | INTRAVENOUS | Qty: 100

## 2021-06-13 MED FILL — ACETAMINOPHEN 1,000 MG/100 ML (10 MG/ML) IV: 1000 mg/100 mL (10 mg/mL) | INTRAVENOUS | Qty: 100

## 2021-06-13 NOTE — ED Provider Notes (Signed)
ED Provider Notes by Adine Madura, NP at 06/13/21 1129                Author: Adine Madura, NP  Service: EMERGENCY  Author Type: Nurse Practitioner       Filed: 06/13/21 1806  Date of Service: 06/13/21 1129  Status: Attested           Editor: Adine Madura, NP (Nurse Practitioner)  Cosigner: Christinia Gully, DO at 06/13/21 1900          Attestation signed by Christinia Gully, DO at 06/13/21 1900          .Marland KitchenI was personally available for consultation in the emergency department.  I have reviewed the chart and agree with the documentation recorded by the [facility],  including the assessment, treatment plan, and disposition.   Christinia Gully, DO                               11:29 AM: Brandon Ross is a 61 y.o. male  who presents to the ED for evaluation of 2 days dark watery stools.  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  He denies fever.        Past Medical History:        Diagnosis  Date         ?  History of low potassium       ?  HTN (hypertension)       ?  Personal history of kidney cancer           ?  Rectal abscess             History reviewed. No pertinent surgical history.        Social History          Tobacco Use         ?  Smoking status:  Never     ?  Smokeless tobacco:  Never       Substance Use Topics         ?  Alcohol use:  Not on file           No Known Allergies         Review of Systems    Constitutional: Negative.  Negative for chills and fever.    HENT: Negative.      Eyes: Negative.     Respiratory: Negative.      Gastrointestinal:  Positive for abdominal pain (generalized) and  diarrhea (dark colored, watery).    Endocrine: Negative.     Genitourinary: Negative.     Musculoskeletal: Negative.     Skin: Negative.     Allergic/Immunologic: Negative.     Neurological: Negative.     Hematological: Negative.     Psychiatric/Behavioral: Negative.      All other systems reviewed and are negative.       Visit Vitals      BP   129/88 (BP 1 Location: Left upper arm, BP Patient Position: Semi fowlers;At rest)        Pulse  68     Temp  97.7 F (36.5 C)     Resp  18     Ht  _0  (1.854 m)     Wt  117.5 kg (259 lb)     SpO2  98%  BMI  34.17 kg/m           Physical Exam   Vitals and nursing note reviewed.    Constitutional:        General: He is not in acute distress.      Appearance: He is well-developed.    HENT:       Head: Normocephalic and atraumatic.    Cardiovascular:       Rate and Rhythm: Normal rate and regular rhythm.       Heart sounds: Normal heart sounds. No murmur heard.   Pulmonary:       Effort: Pulmonary effort is normal. No respiratory distress.       Breath sounds: Normal breath sounds.     Abdominal:       General: Abdomen is protuberant. There is distension.       Palpations: Abdomen is soft.       Tenderness: There is no abdominal tenderness. There is no guarding or rebound.     Musculoskeletal:          General: Normal range of motion.       Cervical back: Normal range of motion and neck supple.    Skin:      General: Skin is warm and dry.       Capillary Refill: Capillary refill takes less than 2 seconds.       Findings: No rash.    Neurological:       Mental Status: He is alert and oriented to person, place, and time.       Cranial Nerves: No cranial nerve deficit.       Sensory: No sensory deficit.    Psychiatric:          Behavior: Behavior normal.              I, Adine Madura, NP, reviewed the patient's past history, allergies and home medications as documented in the nursing chart.      Labs:     Recent Results (from the past 24 hour(s))     URINALYSIS W/ RFLX MICROSCOPIC          Collection Time: 06/13/21 11:57 AM         Result  Value  Ref Range            Color  YELLOW  YEL         Appearance  CLEAR  CLEAR         Specific gravity  1.011  1.005 - 1.030         pH (UA)  5.5  4.6 - 8.0         Protein  Negative  NEG mg/dL       Glucose  Negative  NEG mg/dL       Ketone  Negative  NEG mg/dL        Bilirubin  Negative  NEG         Blood  Negative  NEG         Urobilinogen  0.2  0.2 - 1.0 EU/dL       Nitrites  Negative  NEG         Leukocyte Esterase  Negative  NEG         METABOLIC PANEL, COMPREHENSIVE          Collection Time: 06/13/21 11:59 AM         Result  Value  Ref Range  Sodium  135 (L)  136 - 145 mmol/L       Potassium  4.3  3.4 - 5.1 mmol/L       Chloride  103  98 - 107 mmol/L       CO2  23  20.0 - 31.0 mmol/L       Anion gap  13  10 - 20 mmol/L       Glucose  99  74 - 106 mg/dL       BUN  10  9 - 23 mg/dL       Creatinine  0.98  0.70 - 1.30 mg/dL       GFR est AA  >60  >60 ml/min/1.76m       GFR est non-AA  >60  >60 ml/min/1.717m      Calcium  8.6 (L)  8.7 - 10.4 mg/dL       Bilirubin, total  0.5  0.3 - 1.2 mg/dL       ALT (SGPT)  24  10 - 49 U/L       AST (SGOT)  31  0 - 33.9 U/L       Alk. phosphatase  98  46 - 116 U/L       Protein, total  7.5  5.7 - 8.2 g/dL       Albumin  3.8  3.48 - 5.0 g/dL       Globulin  3.7  1.7 - 4.7 g/dL       A-G Ratio  1.0  0.7 - 2.8         LIPASE          Collection Time: 06/13/21 11:59 AM         Result  Value  Ref Range            Lipase  30  12 - 53 U/L       CBC WITH AUTOMATED DIFF          Collection Time: 06/13/21 11:59 AM         Result  Value  Ref Range            WBC  7.2  4.8 - 10.6 K/uL       RBC  4.12 (L)  4.70 - 6.00 M/uL       HGB  13.8 (L)  14.0 - 18.0 g/dL       HCT  38.5 (L)  42.0 - 52.0 %       MCV  93.4  81.0 - 94.0 FL       MCH  33.5  27.0 - 35.0 PG       MCHC  35.8  30.7 - 37.3 g/dL       RDW  13.7  11.5 - 14.0 %       PLATELET  271  130 - 400 K/uL       MPV  10.2  9.2 - 11.8 FL       NRBC  0.0  0 PER 100 WBC       ABSOLUTE NRBC  0.00  0.0 - 0.01 K/uL       NEUTROPHILS  66  48.0 - 72.0 %       LYMPHOCYTES  19  18.0 - 40.0 %       MONOCYTES  9  2.0 - 12.0 %       EOSINOPHILS  4  0.0 - 7.0 %  BASOPHILS  1  0.0 - 3.0 %       IMMATURE GRANULOCYTES  1 (H)  0 - 0.5 %       ABS. NEUTROPHILS  4.8  2.3 - 7.6 K/UL       ABS. LYMPHOCYTES   1.4  0.9 - 4.2 K/UL       ABS. MONOCYTES  0.6  0.1 - 1.7 K/UL       ABS. EOSINOPHILS  0.3  0.0 - 1.0 K/UL       ABS. BASOPHILS  0.1  0.0 - 0.4 K/UL       ABS. IMM. GRANS.  0.1  0.0 - 0.17 K/UL       DF  AUTOMATED          PROTHROMBIN TIME + INR          Collection Time: 06/13/21 11:59 AM         Result  Value  Ref Range            Prothrombin time  10.1  9.50 - 12.10 sec       INR  1.0          PTT          Collection Time: 06/13/21 11:59 AM         Result  Value  Ref Range            aPTT  26.0  23.90 - 30.70 SEC       EKG, 12 LEAD, INITIAL          Collection Time: 06/13/21 12:50 PM         Result  Value  Ref Range            Ventricular Rate  71  BPM       Atrial Rate  71  BPM       P-R Interval  194  ms       QRS Duration  92  ms       Q-T Interval  410  ms       QTC Calculation (Bezet)  445  ms       Calculated P Axis  34  degrees       Calculated R Axis  -21  degrees       Calculated T Axis  25  degrees       Diagnosis                 Normal sinus rhythm   Normal ECG   No previous ECGs available          SARS-COV-2 RNA,POC          Collection Time: 06/13/21  1:59 PM         Result  Value  Ref Range            SARS-COV-2 RNA POC  Negative  NEG         CBC WITH AUTOMATED DIFF          Collection Time: 06/13/21  5:37 PM         Result  Value  Ref Range            WBC  7.6  4.8 - 10.6 K/uL       RBC  3.92 (L)  4.70 - 6.00 M/uL       HGB  12.9 (L)  14.0 - 18.0 g/dL       HCT  38.6 (  L)  42.0 - 52.0 %       MCV  98.5 (H)  81.0 - 94.0 FL       MCH  32.9  27.0 - 35.0 PG       MCHC  33.4  30.7 - 37.3 g/dL       RDW  13.6  11.5 - 14.0 %       PLATELET  234  130 - 400 K/uL       MPV  9.7  9.2 - 11.8 FL       NRBC  0.0  0 PER 100 WBC       ABSOLUTE NRBC  0.00  0.0 - 0.01 K/uL       NEUTROPHILS  61  48.0 - 72.0 %       LYMPHOCYTES  26  18.0 - 40.0 %       MONOCYTES  8  2.0 - 12.0 %       EOSINOPHILS  4  0.0 - 7.0 %       BASOPHILS  1  0.0 - 3.0 %       IMMATURE GRANULOCYTES  1 (H)  0 - 0.5 %       ABS. NEUTROPHILS  4.6  2.3  - 7.6 K/UL       ABS. LYMPHOCYTES  2.0  0.9 - 4.2 K/UL       ABS. MONOCYTES  0.6  0.1 - 1.7 K/UL       ABS. EOSINOPHILS  0.3  0.0 - 1.0 K/UL       ABS. BASOPHILS  0.0  0.0 - 0.4 K/UL       ABS. IMM. GRANS.  0.0  0.0 - 0.17 K/UL            DF  AUTOMATED              Radiology:   CT ABD PELV WO CONT      Result Date: 06/13/2021   CT ABDOMEN PELVIS WITHOUT IV CONTRAST CLINICAL DATA: watery stool, pain, melena.  history of preianal abscess with drain.  partial nephrectomy COMPARISON: None. TECHNIQUE: CT scan of the abdomen and pelvis was performed from the lung base to  below the  pubic symphysis without  administration of intravenous oral contrast.  The acquisition data was reviewed in the axial, sagittal and coronal plane. Oral contrast was utilized. All CT scans are performed using dose optimization techniques as appropriate  to a performed exam including the following: automated exposure control, adjustment  of the mA and/or kV according to patient size (this includes techniques or standardized protocols for targeted exams where dose is matched to indication/reason for exam),  or use of iterative reconstruction technique. FINDINGS: Lung Base: No focal pneumonia or effusion. Abdomen: Small nonobstructing bilateral renal calculi noted. No hydronephrosis. No contour deforming renal masses. There is stranding of the fat in the  right perirenal location probably from prior surgery. The remainder of the  intraabdominal organs including the liver, adrenal gland, gallbladder, pancreas and the spleen has a normal appearance. There is no free fluid, inflammatory changes or pathological  lymphadenopathy. The abdominal aorta is of normal caliber. Hollow viscera: No evidence of bowel obstruction.  There is no colitis. Nonvisualized appendix although there are no secondary signs of appendicitis such as pericecal inflammation. Occasional  sigmoid diverticula without evidence of diverticulitis. Pelvis: Enlarged prostate. There are no  fluid collections. There is no pathological lymphadenopathy or inflammatory changes. The urinary bladder  is unremarkable. The inguinal regions have a normal  appearance. There is no perianal abscess The bony structures shows no fracture or destructive bony process. 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       No acute intra-abdominal or pelvic pathology seen. Nonobstructing nephrolithiasis. Enlarged prostate. Electronically signed by:  Torrie Mayers MD  06/13/2021 05:36 PM EST Workstation: [id number]             Procedures       <EMERGENCY DEPARTMENT CASE SUMMARY>      Impression/Differential Diagnosis: intra abdominal infection, rectal bleeding, food borne illness         Medical Decision Making   Amount  and/or Complexity of Data Reviewed   External Data Reviewed: labs, radiology, ECG and notes.      Details: From prior hospitalizations in [place].   Labs: ordered.      Details: CBC shows no acute infection, however there was a slight drop in hemoglobin after several hours observation and 4 episodes of diarrhea in the emergency department.   Mild hypokalemia noted.   Radiology: ordered.   ECG/medicine tests: ordered and independent interpretation performed.      Details: Normal sinus rhythm, rate 71   Discussion of management or test interpretation with external provider(s): 5:45 PM Case presentation and findings discussed with Dr. Cathie Olden via telephone. Dr. Cathie Olden recommends a.m. consult with colorectal  surgery.  He agrees with plan..       5:46 PM Case presentation and findings discussed with Dr. Glori Luis via telephone. Dr. Glori Luis is advised of recommendations of GI.  He will admit to the hospital service.       Risk   Prescription drug management.   Decision regarding hospitalization.              ED Course:    60 y.o. male presented to the ED for evaluation of dark watery stools x several days. He is in [place] for work and was referred by his Psychologist, sport and exercise in  for evaluation.        PLAN: labs, CT abd and pelvis with oral contrast      4:45 PM CT read is still pending.  Discussed with radiologist.        All findings discussed with patient at the bedside.  All questions answered.  Stool studies are pending and patient states he feels "better" after IV fluid.  IV antibiotics initiated at suggestion of GI.  Patient agrees with plan for admission.      6:06 PM Patient was reassessed prior to disposition. Discussed results, diagnosis and treatment plan. Patient's questions were answered. Patient agrees with plan to admit.       Final Impression/Diagnosis:      Encounter Diagnoses              ICD-10-CM  ICD-9-CM          1.  Diarrhea, unspecified type   R19.7  787.91           Patient condition at time of disposition: guarded         I have reviewed the following home medications:        Prior to Admission medications             Medication  Sig  Start Date  End Date  Taking?  Authorizing Provider            metoprolol succinate (TOPROL-XL) 25 mg XL tablet  Take 1 Tablet by mouth daily.  01/14/21    Yes  Other, Phys, MD     POTASSIUM CHLORIDE  by Does Not Apply route.      Yes  Other, Phys, MD     hydroCHLOROthiazide (MICROZIDE) 12.5 mg capsule  Take 1 Capsule by mouth daily.        Other, Phys, MD            ALPRAZolam (XANAX XR) 1 mg XR tablet  Take 1 Tablet by mouth.        Other, Phys, MD           Adine Madura, NP

## 2021-06-13 NOTE — Progress Notes (Signed)
Progress Notes by Saul Fordyce, RN at 06/13/21 2021                Author: Saul Fordyce, RN  Service: --  Author Type: Registered Nurse       Filed: 06/14/21 0717  Date of Service: 06/13/21 2021  Status: Addendum          Editor: Saul Fordyce, RN (Registered Nurse)          Related Notes: Original Note by Saul Fordyce, RN (Registered Nurse) filed at 06/13/21  2021               Received patient from ED alert and oriented,    Verbal shift change report given to Naomie (oncoming nurse) by Stanford Scotland RN (offgoing nurse). Report included the following information SBAR, Kardex, ED Summary, MAR, and Recent Results.      Problem: Falls - Risk of   Goal: *Absence of Falls   Description: Document Brandon Ross Fall Risk and appropriate interventions in the flowsheet.   Outcome: Progressing Towards Goal   Note: Fall Risk Interventions:                                             Problem: Risk for Spread of Infection   Goal: Prevent transmission of infectious organism to others   Description: Prevent the transmission of infectious organisms to other patients, staff members, and visitors.   Outcome: Progressing Towards Goal

## 2021-06-13 NOTE — ED Notes (Signed)
ED Triage Notes by Appley, Eligah East, RN at 06/13/21 1119                Author: Chrys Racer, RN  Service: --  Author Type: Registered Nurse       Filed: 06/13/21 1122  Date of Service: 06/13/21 1119  Status: Addendum          Editor: Chrys Racer, RN (Registered Nurse)          Related Notes: Original Note by Chrys Racer, RN (Registered Nurse) filed at 06/13/21  1121               C/o black, watery stools since Saturday. Hx rectal abscess, has active drain. 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 Loss of appetite, weakness.

## 2021-06-13 NOTE — H&P (Signed)
H&P by Charlyne Petrin, MD at 06/13/21  1941                Author: Charlyne Petrin, MD  Service: Internal Medicine  Author Type: Physician       Filed: 06/14/21 0453  Date of Service: 06/13/21 1941  Status: Addendum          Editor: Charlyne Petrin, MD (Physician)          Related Notes: Original Note by Charlyne Petrin, MD (Physician) filed at 06/14/21 0100               Chief complaint:  "I'm passing dark bowel for 2 days while driving my truck."      History of Present Illness:  61 years old Caucasian male with PMH of FULL CODE @ parents'  home in [place], obesity with BMI 34.17 (height 185.4 cm; weight 117.5 kg), anxiety disorder, HTN, occasional tobacco use, occasional consumption of one can of beer, never on a daily basis, 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 Alwyn Ren), preceded by peri-anal abscess requiring drain placement (2021, [facility], Colo-Rectal Surgeon Dr. 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      Patient denies antecedent/coincident fevers, chills, diaphoresis, SOB, DOE, cough, wheeze, sore throat, hemoptysis, chest pains, palpitations, pleurisy, nausea, vomiting, abdominal pain, pelvic pain, hematemesis, hematochezia, melena, hematuria, dysuria,  frequency, urgency, headaches, dizziness, lightheadedness, visual changes, hearing changes, weakness, falls, trauma, syncope, bruising, rash, sick contacts, travel history,  food/drug ingestions novel/new, dysarthria, aphasia, confusion, or lethargy.  All other review of systems is reported as negative by the patient on admission date 06/13/2021.      In the ER, patient was afebrile @ 98.4 degrees Fahrenheit, HR 84, RR 18,  O2 sat 99% on room air, and BP 169/106 (06/13/2021, 11:28am).  Exam was noted for a non-tender, non-distended abdomen without rebound, guarding, Murphy's sign, or organomegaly.   Bowel sounds were auscultated in all 4 quadrants.  Stool GUAIAC test is pending.         Labs in the ER include:      WBC 7.6, N61 L26 M8 E4 B1, Hb 12.9, MCV 98.5, MCHC 33.4, platelet 234 (06/13/2021, 5:37pm).      U/A:  LE-, nitrite- (06/13/2021, 11:57am).      INR 1.0 (06/13/2021, 11:59am).      Na 135, K 4.3, BUN/crt 10/0.98, Ca 8.6, AAA 31/24/98, Tbili 0.5 (06/13/2021, 11:59am).      Lactic acid #1 0.6 mmol/L (06/13/2021, 9:00pm).   Lactic acid #2 _________ (06/14/2021, 1:00am).   Lactic acid #3 _________ (06/14/2021,  4:00am).      Procalcitonin #1 < 0.05 ng/mL (06/13/2021, 9:00pm).   Procalcitonin #2 ___________(06/14/2021, 4:00am).      COVID RNA PCR negative (06/13/2021, 2:08pm).      Additional testing includes:      CT abd/pelvis without contrast (06/13/2021, 2:50pm):   1.  No acute intra-abdominal or pelvic pathology seen.   2.  Non-obstructing nephrolithiasis.    3.  Enlarged prostate.      EKG (06/13/2021, 12:50pm):  NSR @ 71, PR 194, QTC 445, no acute ST elevations/depressions.  (by my review).  Patient was subsequently admitted to the inpatient hospitalist service @ [facility] on 06/13/2021 with the following diagnoses:      1.  Acute blood loss anemia with admission Hb 12.9 g/dL, MCV 16.1, MCHC 09.6, of unclear etiology, R/O acute GI bleed.   2.  Acute hypovolemic hyponatremia with admission Na 135 mmol/L.         To address #1, patient awaits stool GUAIAC testing daily x 3 samples, repeat Hb testing (06/14/2021, 12:00am; 4:00am), and formal GI Service evaluation with Dr. Lissa Hoard May in the 06/14/2021 am.  In the interim, patient was started on a full liquid diet  (as patient reports feeling very hungry, and has no complaints of nausea, vomiting, or abdominal/pelvic pains).  Patient was also started on  protonix  IV q12 (06/13/2021, 11:00pm) with stool GUAIAC testing x 3 samples pending.  Of note, patient was  started on rocephin 1g IV x 1 dose (06/13/2021, 5:41pm) and flagyl  IV x 1 dose (06/13/2021, 5:41pm) in [facility] ER bed #31, which I have opted NOT to continue in [facility] Tele bed [phone] as I do not suspect bacterial  infection in this afebrile patient with a soft abdomen, normal WBC 7.6, N61 L26 M8 E4 B1 (06/13/2021, 5:37pm), normal lactic acid 0.6 mmol/L (06/13/2021, 9:00pm), and normal procalcitonin < 0.05 ng/mL (06/13/2021, 9:00pm).      To address #2, patient was started on 1 liter of 0.9% NS @ 1000 mL/hr (06/13/2021, 11:27am) in [facility] ER bed #31, followed by 1 liter of 0.9% NS @ 118 mL/hr (06/13/2021, 7:31pm) in [facility] Tele bed [phone], based on a delivery  rate of 1 mL of 0.9% NS per kg of body weight per hour, and a body weight of 117.5 kg.  I will check repeat Na level in the 06/14/2021 am.            Past medical history:  As above.   Past surgical history:  As above.      Family history:  Patient's father is alive at 32 years of age with non-diabetic neuropathy of the bilateral lower extremities.      Family history:  Patient's mother is alive at 21 years of age with HTN, but no DM or tobacco abuse.      Social history:  Patient reports smoking an occasional cigarette and an occasional can of beer, frequently at the same time, but never on a daily  or weekly basis, and never when he is out driving on the road.  Patient also denies illicit drug use.  Patient completed the 11th grade and then dropped out of high school to start driving a truck.  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  Patient concedes to suffering from lumbago due to sitting / driving in his truck for so lung, but  takes no OTC or prescription  medications to mitigate his lumbago.  Patient is single, never having been married, separated, or divorced, but has 2 adult sons, aged 95 years and 30 years, born to 2 separate she-devils, and both alive and well and living on their own.  Patient lives  with his parents in their own home, and ambulates without assistance from cane, walker, or wheelchair.  Patient comes to [facility] ER today, 06/13/2021, directly from patient's 18 wheel  tractor trailer truck, which is now parked in the [facility] ER parking lot, unattended, which concerns the patient greatly.        Medications @ patient's parents' home:   1.   Alprazolam 1mg  PO daily prn anxiety.   2.   HCTZ 12.5mg  PO daily.   3.   Metoprolol XL 25mg  PO daily.      Allergies:   1.   NKDA.   2.   NKFA.      Review of Systems:  As above.      Physical Exam:   98.4 degrees Fahrenheit, HR 84, RR 18, O2 sat 99% on room air, and BP 169/106 (06/13/2021, 11:28am).    General:  Comfortable, cooperative, coherent.  Awake, alert.  Not confused, lethargic, or obtunded.  Patient speaks fluently, articulately, and without pause, interruption, cough, or wheeze.     HEENT:  Normocephalic, atraumatic,  PERRL. EOMI.  No nystagmus, gaze paresis, anisocoria, miosis, hyphema, scleral injection, conjunctivitis, chemosis, or pterygium.  No otorrhea.  Tympanic membranes translucent and intact; no perforation.  No pharyngeal  erythema/edema.   Neck:  Supple, no JVD, goiter, or stridor.   Chest:  Symmetric rise and fall with respirations.  Non-tender chest.   Lungs:  Clear to auscultation and percussion.  No wheeze, egophony, pectoriloquy, increase in tactile fremitus, or flatness/dullness to percussion at the bases.   Heart:  RRR, S1 and S2 noted.  No S3 or S4 noted.  No tripartite friction rub.  No murmur.   Abd:  Soft, non-tender, non-distended.  No organomegaly. Bowel sounds auscultated in all 4 quadrants.  No rebound, guarding,  or Murphy's sign.   Ext:  No clubbing, cyanosis, or edema.  2+ pedal pulses bilaterally.   Skin:  No decubitus ulcer, exanthem, or enanthem.   Neuro:  Alert and oriented in regards to person, place, and time.  DTR+.  5/5 motor strength in all 4 extremities.  No dysarthria, facial droop, or pronator drift.   Urology:  No foley catheter.  No urethral discharge.       Labs:  As above.      ASSESSMENT:  61 years old Caucasian male with PMH of FULL CODE @ parents' home in 08/13/2021, obesity with BMI 34.17 (height 185.4 cm; weight 117.5 kg), anxiety disorder, HTN, occasional tobacco use, occasional consumption of one can of beer, never on a daily basis, right renal clear  cell carcinoma s/p partial nephrectomy (2022, [facility], Robotic Urologist Dr. 07[birth year]-1993), preceded by peri-anal abscess requiring drain placement (2021, [facility], [place]o-Rectal Surgeon Dr. 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      Patient was subsequently admitted to the inpatient hospitalist service @ [facility] on 06/13/2021 with the following diagnoses:         1.  Acute blood loss anemia with admission Hb 12.9 g/dL, [facility], MCHC 08/13/2021, of unclear etiology, R/O acute GI bleed.   2.   Acute hypovolemic hyponatremia with admission Na 135 mmol/L.         PLAN:   1.  GI. Acute blood loss anemia with admission Hb 12.9 g/dL, MCV 34.1, MCHC 96.2, of unclear etiology, R/O acute GI bleed.   To address #1, patient awaits stool  GUAIAC testing daily x 3 samples, repeat Hb testing (06/14/2021, 12:00am; 4:00am), and formal GI Service evaluation with Dr. Lissa Hoard May in the 06/14/2021 am.  In the interim, patient was started on a full liquid diet  (as patient reports feeling  very hungry, and has no complaints of nausea, vomiting, or abdominal/pelvic pains).  Patient was also started on protonix  IV q12 (06/13/2021, 11:00pm) with stool GUAIAC testing x 3 samples pending.  Of note, patient was  started on rocephin 1g IV x 1 dose (06/13/2021, 5:41pm) and flagyl  IV x 1 dose (06/13/2021, 5:41pm) in [facility] ER bed #31, which I have opted NOT to continue in [facility] Tele bed [phone] as I do not suspect bacterial  infection in this afebrile patient with a soft abdomen, normal WBC 7.6, N61 L26 M8 E4 B1 (06/13/2021, 5:37pm), normal lactic acid 0.6 mmol/L (06/13/2021, 9:00pm), and normal procalcitonin < 0.05 ng/mL (06/13/2021, 9:00pm).      2.  Fluids, electrolytes, nutrition.  Acute hypovolemic hyponatremia with admission Na 135 mmol/L (06/13/2021, 11:59am).   Cf., no historical data for Na levels in the [facility].  To address #2, patient was started on 1 liter of 0.9% NS @ 1000 mL/hr (06/13/2021,  11:27am) in [facility] ER bed #31, followed by 1 liter of 0.9% NS @ 118 mL/hr (06/13/2021, 7:31pm) in [facility] Tele bed [phone], based on a delivery rate of 1 mL of 0.9% NS per kg of body weight per hour, and a body weight of  117.5 kg.  I will check repeat Na level in the 06/14/2021 am.      3.  Psychiatry.  Anxiety disorder.  Asymptomatic.  Hold off patient's home-scheduled alprazolam   PO daily prn anxiety given the potential for this medication to cause hypotension in this patient with acute blood loss anemia, R/O acute GI bleed.  I surmise, however, that patient may resume this medication on hospital discharge back to his parents'  home.      4.  CV.  HTN.  Modestly elevated with admission BP 169/106 (06/13/2021, 11:28am).  Hold  off patient's home-scheduled HCTZ 12.5mg  PO daily and metoprolol XL  PO daily given the potential for either/both medications to cause hypotension in this patient with acute blood loss anemia,  R/O acute GI bleed.  I surmise, however, that patient  may resume both medications on hospital discharge back to his parents' home.       5.  Vascular.  DVT prophylaxis.  Hold off pharmacologic DVT prophylaxis with heparin/lovenox  given the potential for either medication to exacerbate acute blood loss anemia.  Of note, patient reports no calf pain, leg swelling, or pleurisy to suggest either DVT or PE on admission date 06/13/2021.      6.  Pain Management.  Patient reports 0/10 pain anywhere on admission date 06/13/2021.   Start tylenol  IV q6 prn pain 1-10, headache, temp > 100.4 degrees Fahrenheit (06/13/2021, 7:33pm).      7.  Oncology.  Right renal clear cell carcinoma s/p partial nephrectomy (2022, [facility], Robotic Urologist  Dr. Alwyn Ren).  In clinical remission as per patient's report.  Observe.      8.  General Surgery/Infectious Disease.  Peri-anal abscess requiring drain placement (2021, [facility],  Colo-Rectal Surgeon Dr. Durenda Hurt).  Asymptomatic s/p normal/negative colonoscopy ~3 weeks ago @ [facility]. Of  note, patient was started on rocephin 1g IV  x 1 dose (06/13/2021, 5:41pm) and flagyl 500mg  IV x 1 dose (06/13/2021, 5:41pm) in [facility], which I have opted NOT to continue in [facility] Tele bed [phone] as I do not suspect bacterial infection in this afebrile  patient with a soft abdomen, normal WBC 7.6, N61 L26 M8 E4 B1 (06/13/2021, 5:37pm), normal lactic acid 0.6 mmol/L (06/13/2021, 9:00pm), and normal procalcitonin < 0.05 ng/mL (06/13/2021, 9:00pm).      9.  Disposition.  Code status, FULL CODE @ parents' home in [place], [place].  ACLS as required.   Condition of patient remains fair.  I anticipate that this patient will remain in [facility] for the next 2 midnights in order to demonstrate clinical  improvement in his admitting diagnoses of:         1.  Acute blood loss anemia with admission Hb 12.9 g/dL, MCV 96.098.5, MCHC 45.433.4, of unclear etiology, R/O acute GI bleed.   2.   Acute hypovolemic hyponatremia with admission Na 135 mmol/L.         Patient will then be discharged back to his 9018 wheel tractor trailer truck.  Patient will follow up with his PCP Mr. Sharlet SalinaBenjamin L. Mann ([place], [place]) within 5 days of hospital discharge.  Of final  note, I spoke with the patient at  the bedside [facility], and his mother, Ms. Milagros EvenerArliene Hanak [phone]), today, 06/13/2021, and both individuals concur with the assessment and plan as described above.         Total time spent, 81 minutes.

## 2021-06-14 LAB — CBC WITH AUTO DIFFERENTIAL
Basophils %: 0 % (ref 0.0–3.0)
Basophils Absolute: 0 10*3/uL (ref 0.0–0.4)
Eosinophils %: 5 % (ref 0.0–7.0)
Eosinophils Absolute: 0.4 10*3/uL (ref 0.0–1.0)
Granulocyte Absolute Count: 0.1 10*3/uL (ref 0.0–0.17)
Hematocrit: 36.3 % — ABNORMAL LOW (ref 42.0–52.0)
Hemoglobin: 12.8 g/dL — ABNORMAL LOW (ref 14.0–18.0)
Immature Granulocytes: 1 % — ABNORMAL HIGH (ref 0–0.5)
Lymphocytes %: 20 % (ref 18.0–40.0)
Lymphocytes Absolute: 1.5 10*3/uL (ref 0.9–4.2)
MCH: 33.6 PG (ref 27.0–35.0)
MCHC: 35.3 g/dL (ref 30.7–37.3)
MCV: 95.3 FL — ABNORMAL HIGH (ref 81.0–94.0)
MPV: 9.7 FL (ref 9.2–11.8)
Monocytes %: 9 % (ref 2.0–12.0)
Monocytes Absolute: 0.7 10*3/uL (ref 0.1–1.7)
NRBC Absolute: 0 10*3/uL (ref 0.0–0.01)
Neutrophils %: 66 % (ref 48.0–72.0)
Neutrophils Absolute: 5 10*3/uL (ref 2.3–7.6)
Nucleated RBCs: 0 PER 100 WBC
Platelets: 230 10*3/uL (ref 130–400)
RBC: 3.81 M/uL — ABNORMAL LOW (ref 4.70–6.00)
RDW: 13.6 % (ref 11.5–14.0)
WBC: 7.6 10*3/uL (ref 4.8–10.6)

## 2021-06-14 LAB — COMPREHENSIVE METABOLIC PANEL
ALT: 20 U/L (ref 10–49)
AST: 22 U/L (ref 0–33.9)
Albumin/Globulin Ratio: 1.1 (ref 0.7–2.8)
Albumin: 3.3 g/dL — ABNORMAL LOW (ref 3.48–5.0)
Alkaline Phosphatase: 87 U/L (ref 46–116)
Anion Gap: 9 mmol/L — ABNORMAL LOW (ref 10–20)
BUN: 10 mg/dL (ref 9–23)
CO2: 29 mmol/L (ref 20.0–31.0)
Calcium: 8.3 mg/dL — ABNORMAL LOW (ref 8.7–10.4)
Chloride: 105 mmol/L (ref 98–107)
Creatinine: 1.01 mg/dL (ref 0.70–1.30)
EGFR IF NonAfrican American: >60 mL/min/{1.73_m2} (ref >60)
GFR African American: >60 mL/min/{1.73_m2} (ref >60)
Globulin: 3.1 g/dL (ref 1.7–4.7)
Glucose: 96 mg/dL (ref 74–106)
Potassium: 3.7 mmol/L (ref 3.4–5.1)
Sodium: 139 mmol/L (ref 136–145)
Total Bilirubin: 0.7 mg/dL (ref 0.3–1.2)
Total Protein: 6.4 g/dL (ref 5.7–8.2)

## 2021-06-14 LAB — EKG 12-LEAD
Atrial Rate: 71 {beats}/min
Diagnosis: NORMAL
P Axis: 34 degrees
P-R Interval: 194 ms
Q-T Interval: 410 ms
QRS Duration: 92 ms
QTc Calculation (Bazett): 445 ms
R Axis: -21 degrees
T Axis: 25 degrees
Ventricular Rate: 71 {beats}/min

## 2021-06-14 LAB — TYPE AND SCREEN
ABO/Rh: A POS
Antibody Screen: NEGATIVE

## 2021-06-14 LAB — LACTIC ACID
Lactic Acid: 0.6 MMOL/L (ref 0.50–2.20)
Lactic Acid: 0.6 MMOL/L (ref 0.50–2.20)
Lactic acid: 0.6 MMOL/L (ref 0.50–2.20)
Lactic acid: 0.6 MMOL/L (ref 0.50–2.20)

## 2021-06-14 LAB — PROCALCITONIN
Procalcitonin: <0.05 ng/mL (ref 0.0–0.49)
Procalcitonin: <0.05 ng/mL (ref 0.0–0.49)
Procalcitonin: <0.05 ng/mL (ref 0.0–0.49)
Procalcitonin: <0.05 ng/mL (ref 0.0–0.49)

## 2021-06-14 LAB — IRON AND TIBC
Iron Saturation: 39 % (ref 15–55)
Iron: 110 ug/dL (ref 65–175)
TIBC: 282 ug/dL (ref 250–425)

## 2021-06-14 LAB — *Unknown: Occult Blood Fecal: POSITIVE — AB

## 2021-06-14 LAB — CBC WITH AUTOMATED DIFF
ABS. BASOPHILS: 0 10*3/uL (ref 0.0–0.4)
ABS. EOSINOPHILS: 0.4 10*3/uL (ref 0.0–1.0)
ABS. IMM. GRANS.: 0.1 10*3/uL (ref 0.0–0.17)
ABS. LYMPHOCYTES: 1.5 10*3/uL (ref 0.9–4.2)
ABS. MONOCYTES: 0.7 10*3/uL (ref 0.1–1.7)
ABS. NEUTROPHILS: 5 10*3/uL (ref 2.3–7.6)
ABSOLUTE NRBC: 0 10*3/uL (ref 0.0–0.01)
BASOPHILS: 0 % (ref 0.0–3.0)
EOSINOPHILS: 5 % (ref 0.0–7.0)
HCT: 36.3 % — ABNORMAL LOW (ref 42.0–52.0)
HGB: 12.8 g/dL — ABNORMAL LOW (ref 14.0–18.0)
IMMATURE GRANULOCYTES: 1 % — ABNORMAL HIGH (ref 0–0.5)
LYMPHOCYTES: 20 % (ref 18.0–40.0)
MCH: 33.6 PG (ref 27.0–35.0)
MCHC: 35.3 g/dL (ref 30.7–37.3)
MCV: 95.3 FL — ABNORMAL HIGH (ref 81.0–94.0)
MONOCYTES: 9 % (ref 2.0–12.0)
MPV: 9.7 FL (ref 9.2–11.8)
NEUTROPHILS: 66 % (ref 48.0–72.0)
NRBC: 0 PER 100 WBC
PLATELET: 230 10*3/uL (ref 130–400)
RBC: 3.81 M/uL — ABNORMAL LOW (ref 4.70–6.00)
RDW: 13.6 % (ref 11.5–14.0)
WBC: 7.6 10*3/uL (ref 4.8–10.6)

## 2021-06-14 LAB — EKG, 12 LEAD, INITIAL
Atrial Rate: 71 {beats}/min
Calculated R Axis: -21 degrees
Diagnosis: NORMAL
Q-T Interval: 410 ms
Ventricular Rate: 71 {beats}/min

## 2021-06-14 LAB — TYPE & SCREEN
ABO/Rh(D): A POS
Antibody screen: NEGATIVE

## 2021-06-14 LAB — METABOLIC PANEL, COMPREHENSIVE
A-G Ratio: 1.1 (ref 0.7–2.8)
ALT (SGPT): 20 U/L (ref 10–49)
AST (SGOT): 22 U/L (ref 0–33.9)
Albumin: 3.3 g/dL — ABNORMAL LOW (ref 3.48–5.0)
Alk. phosphatase: 87 U/L (ref 46–116)
Anion gap: 9 mmol/L — ABNORMAL LOW (ref 10–20)
BUN: 10 mg/dL (ref 9–23)
Bilirubin, total: 0.7 mg/dL (ref 0.3–1.2)
CO2: 29 mmol/L (ref 20.0–31.0)
Calcium: 8.3 mg/dL — ABNORMAL LOW (ref 8.7–10.4)
Chloride: 105 mmol/L (ref 98–107)
Creatinine: 1.01 mg/dL (ref 0.70–1.30)
GFR est AA: >60 mL/min/{1.73_m2} (ref >60)
GFR est non-AA: >60 mL/min/{1.73_m2} (ref >60)
Glucose: 96 mg/dL (ref 74–106)
Potassium: 3.7 mmol/L (ref 3.4–5.1)
Protein, total: 6.4 g/dL (ref 5.7–8.2)
Sodium: 139 mmol/L (ref 136–145)

## 2021-06-14 LAB — OCCULT BLOOD, STOOL: Occult blood, stool: POSITIVE — AB

## 2021-06-14 LAB — IRON PROFILE
Iron % saturation: 39 % (ref 15–55)
Iron: 110 ug/dL (ref 65–175)
TIBC: 282 ug/dL (ref 250–425)

## 2021-06-14 MED ORDER — SODIUM CHLORIDE 0.9 % INJECTION
40 mg | Freq: Two times a day (BID) | INTRAMUSCULAR | Status: AC
Start: 2021-06-14 — End: 2021-06-14
  Administered 2021-06-14 (×2): via INTRAVENOUS

## 2021-06-14 MED ORDER — METOPROLOL SUCCINATE SR 25 MG 24 HR TAB
25 mg | Freq: Every day | ORAL | Status: DC
Start: 2021-06-14 — End: 2021-06-14

## 2021-06-14 MED ORDER — METOPROLOL SUCCINATE SR 25 MG 24 HR TAB
25 mg | Freq: Every day | ORAL | Status: DC
Start: 2021-06-14 — End: 2021-06-14
  Administered 2021-06-14: 13:00:00 via ORAL

## 2021-06-14 MED FILL — METOPROLOL SUCCINATE SR 25 MG 24 HR TAB: 25 mg | ORAL | Qty: 1

## 2021-06-14 MED FILL — PANTOPRAZOLE 40 MG IV SOLR: 40 mg | INTRAVENOUS | Qty: 40

## 2021-06-14 NOTE — Discharge Summary (Signed)
Discharge Summary by Carrington Clamp, PA-C at 06/14/21 1832                Author: Carrington Clamp, PA-C  Service: Physician Assistant  Author Type: Physician Assistant       Filed: 06/14/21 1837  Date of Service: 06/14/21 1832  Status: Signed           Editor: Carrington Clamp, PA-C (Physician Assistant)  Cosigner: De Hollingshead, MD at 06/22/21 [phone]               Discharge Summary      Patient: Brandon Ross               Sex: male            DOA: 06/13/2021   Age:  61 y.o.            LOS:  LOS: 1 day     PCP:    Other, Phys, MD      Treatment Team:  @rrtreatmentteam @             Discharge Date: 06/14/2021      Disposition: left against medical advice      Admission Diagnoses: GI (gastrointestinal bleed) [K92.2]      Discharge Diagnoses:        Problem List as of 06/14/2021  Never Reviewed                           Codes  Class  Noted - Resolved             GI (gastrointestinal bleed)  ICD-10-CM: K92.2   ICD-9-CM: 578.9    06/13/2021 - Present                     Discharge Condition: [facility] Course: 61 year old M with pmhx of obesity, anxiety, HTN, intermittent tobacco and ETOH use, right renal clear cell carcinoma s/p partial  nephrectomy, hx of peri-anal abscess s/p drain placement who presented on 6/5 with 2 day history of dark and loose Bms. CT A&P No acute intra-abdominal or pelvic pathology seen. Non  obstructing nephrolithiasis. Enlarged prostate. Patient noted to have FOBT positive with mild drop in H&H. Started on liquid diet and PPI and admitted for further workup.       Seen by GI and recommended to have EGD on 6/7 and check stool studies. Due to a family emergency, patient requested to leave AMA.                 The patient is voluntarily refusing any further evaluation and treatment in the hospital for His current clinical condition.   The patient is requesting to leave against medical advice.  I discussed the benefits of staying for further evaluation and the risks  of leaving against medical advice with the patient.  Despite my best efforts to convince the patient to stay for further  workup He refused.  I discussed specific risks including but not limited to death, permanent impairment, worsened condition, and need for prolonged hospitalization associated with delayed diagnosis and/or treatment. I have given the patient an opportunity  to ask questions, and have answered those questions to His satisfaction given the information available.  The patient understands the risks of leaving at this time and verbalizes this understanding. He is medically competent to make His own medical decisions.   I informed Him that  He can return to the ER at any time and, in fact, should return if his symptoms worsen or He changes His mind.   I also informed Him to see His primary care doctor and specialist as soon as possible for further evaluation.      Physical Exam:  Cannot assess at the time of discharge       Consults: Gastroenterology      Significant Diagnostic Studies: labs: , microbiology: and radiology:       Discharge Medications:       Current Discharge Medication List                 CONTINUE these medications which have NOT CHANGED          Details        hydroCHLOROthiazide (MICROZIDE) 12.5 mg capsule  Take 1 Capsule by mouth daily.               metoprolol succinate (TOPROL-XL) 25 mg XL tablet  Take 1 Tablet by mouth daily.               ALPRAZolam (XANAX XR) 1 mg XR tablet  Take 1 Tablet by mouth.               POTASSIUM CHLORIDE  by Does Not Apply route.                                Current Discharge Medication List                  Activity: NA      Diet: NA      Code Status: Full      Time spent on discharge: 30 min       Follow-up: NA

## 2021-06-14 NOTE — Progress Notes (Signed)
Progress Notes by Beryle Quant, RN at 06/14/21 1736                Author: Beryle Quant, RN  Service: --  Author Type: Registered Nurse       Filed: 06/14/21 1741  Date of Service: 06/14/21 1736  Status: Signed          Editor: Beryle Quant, RN (Registered Nurse)               1732Patient wants to leave AMA, his father is admitted to the hospital in [place], [place] Ramiro Harvest was made aware and spoke to patient. IV removed.

## 2021-06-14 NOTE — Progress Notes (Signed)
Progress Notes by Renda Rolls, RN at 06/14/21 1743                Author: Renda Rolls, RN  Service: --  Author Type: Registered Nurse       Filed: 06/14/21 1743  Date of Service: 06/14/21 1743  Status: Signed          Editor: Renda Rolls, RN (Registered Nurse)                  Problem: Falls - Risk of   Goal: *Absence of Falls   Description: Document Patrcia Dolly Fall Risk and appropriate interventions in the flowsheet.   Outcome: Resolved/Met       Problem: Patient Education: Go to Patient Education Activity   Goal: Patient/Family Education   Outcome: Resolved/Met       Problem: Risk for Spread of Infection   Goal: Prevent transmission of infectious organism to others   Description: Prevent the transmission of infectious organisms to other patients, staff members, and visitors.   06/14/2021 1743 by Renda Rolls, RN   Outcome: Resolved/Met   06/14/2021 1241 by Renda Rolls, RN   Outcome: Progressing Towards Goal       Problem: Patient Education:  Go to Education Activity   Goal: Patient/Family Education   06/14/2021 1743 by Renda Rolls, RN   Outcome: Resolved/Met   06/14/2021 1241 by Renda Rolls, RN   Outcome: Progressing Towards Goal       Problem: Risk for Spread of Infection   Goal: Prevent transmission of infectious organism to others   Description: Prevent the transmission of infectious organisms to other patients, staff members, and visitors.   Outcome: Resolved/Met       Problem: Patient Education:  Go to Education Activity   Goal: Patient/Family Education   Outcome: Resolved/Met       Problem: Pain   Goal: *Control of Pain   Outcome: Resolved/Met   Goal: *PALLIATIVE CARE:  Alleviation of Pain   Outcome: Resolved/Met       Problem: Patient Education: Go to Patient Education Activity   Goal: Patient/Family Education   Outcome: Resolved/Met

## 2021-06-14 NOTE — Progress Notes (Signed)
Progress Notes by Carrington Clamp, PA-C at 06/14/21 3710                Author: Carrington Clamp, PA-C  Service: Physician Assistant  Author Type: Physician Assistant       Filed: 06/14/21 1314  Date of Service: 06/14/21 0811  Status: Signed           Editor: Carrington Clamp, PA-C (Physician Assistant)  Cosigner: De Hollingshead, MD at 06/14/21 1630                  Progress Note         Patient: Brandon Ross               Sex: male          DOA: 06/13/2021            Date of Birth:  [birth year]      Age:  61 y.o.        LOS:  LOS: 1 day                    Subjective:        Reports no further dark Bms and denies any abdominal pain. No prior  hx of similar episodes. 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         Objective:         Visit Vitals      BP  (!) 144/94 (BP 1 Location: Right arm, BP Patient Position: At rest)     Pulse  65     Temp  97 F (36.1 C)     Resp  18     Ht  6\' 1"  (1.854 m)     Wt  117.5 kg (259 lb)     SpO2  96%        BMI  34.17 kg/m           Physical Exam:   General: pleasant, sitting upright in stretcher, cooperative   HEENT: No nasal discharge, normal mucous membranes   Lungs: Clear to auscultation anteriorly, no accessory muscle use noted    Heart: RRR   Abdomen: soft,mildly distended, active BS, nontender to palpation    Extremities: calves soft, nontender to palpation bilaterally, no notable LEE   Neuro: Alert and oriented to person, place and time, answers questions appropriately    Psych: normal affect and mood         Intake and Output:   Current Shift:  No intake/output data recorded.   Last three shifts:  No intake/output data recorded.      Lab/Data Reviewed:   BMP:      Lab Results         Component  Value  Date/Time            NA  139  06/14/2021 06:21 AM       K  3.7  06/14/2021 06:21 AM       CL  105  06/14/2021 06:21 AM       CO2  29  06/14/2021 06:21 AM       AGAP  9 (L)  06/14/2021 06:21 AM       GLU  96  06/14/2021 06:21 AM        BUN  10  06/14/2021 06:21 AM       CREA  1.01  06/14/2021 06:21 AM       GFRAA  >  60  06/14/2021 06:21 AM            GFRNA  >60  06/14/2021 06:21 AM        CMP:      Lab Results         Component  Value  Date/Time            NA  139  06/14/2021 06:21 AM       K  3.7  06/14/2021 06:21 AM       CL  105  06/14/2021 06:21 AM       CO2  29  06/14/2021 06:21 AM       AGAP  9 (L)  06/14/2021 06:21 AM       GLU  96  06/14/2021 06:21 AM       BUN  10  06/14/2021 06:21 AM       CREA  1.01  06/14/2021 06:21 AM       GFRAA  >60  06/14/2021 06:21 AM       GFRNA  >60  06/14/2021 06:21 AM       CA  8.3 (L)  06/14/2021 06:21 AM       ALB  3.3 (L)  06/14/2021 06:21 AM       TP  6.4  06/14/2021 06:21 AM       GLOB  3.1  06/14/2021 06:21 AM       AGRAT  1.1  06/14/2021 06:21 AM            ALT  20  06/14/2021 06:21 AM        CBC:      Lab Results         Component  Value  Date/Time            WBC  7.6  06/14/2021 06:21 AM       HGB  12.8 (L)  06/14/2021 06:21 AM       HCT  36.3 (L)  06/14/2021 06:21 AM            PLT  230  06/14/2021 06:21 AM           Medications Reviewed              Assessment/Plan        61 year old M with pmh xof anxiety, HTN, occasional tobacco and ETOH abuse, right renal clear cell carcinoma s/p partial nephrectomy, hx of peri-anal abscess s/p drain placement who presents with 2 day history of dark and loose Bms.      GI Bleed    - CT A&P No acute intra-abdominal or pelvic pathology seen. Non obstructing nephrolithiasis. Enlarged prostate.   - FOBT positive    - Follow up stool studies    - PPI    - Full liquid diet; NPO after midnight for EGD in AM   - Appreciate GI management        Acute on ?chronic anemia   - Trend H&H   - Iron profile reviewed    - Transfuse PRN for H&H <7      HTN   - Continue Toprol, holding HCTZ       DVT ppx: SCDs

## 2021-06-14 NOTE — Consults (Signed)
Consults by  Roxine Caddy, MD at 06/14/21 1008                Author: Roxine Caddy, MD  Service: Gastroenterology  Author Type: Physician       Filed: 06/14/21 1040  Date of Service: 06/14/21 1008  Status: Signed          Editor: Roxine Caddy, MD (Physician)            Consult Orders        1. IP CONSULT TO GASTROENTEROLOGY [[id number]] ordered by Charlyne Petrin, MD at 06/13/21 1732                              GASTROENTEROLOGY CONSULTATION NOTE         NAME:  Brandon Ross    DOB:   1960-08-21    MRN:   [id number]          Referring Physician: ER MD      Consult Date: 06/14/2021       Chief Complaint: Draining anal fissure,Dark liquid stool      History of Present Illness: Brandon Ross is a 61 y.o. male  who presents to the ED for evaluation of 2 days dark watery stools.  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  He denies fever. 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      PMH:     Past Medical History:        Diagnosis  Date         ?  History of low potassium       ?  HTN (hypertension)       ?  Personal history of kidney cancer           ?  Rectal abscess             PSH:   History reviewed. No pertinent surgical history.      Allergies:   No Known Allergies      Home Medications:     Prior to Admission Medications     Prescriptions  Last Dose  Informant  Patient Reported?  Taking?      ALPRAZolam (XANAX XR) 1 mg XR tablet  06/13/2021    Yes  Yes      Sig: Take 1 Tablet by mouth.      POTASSIUM CHLORIDE  06/12/2021    Yes  Yes      Sig: by Does Not Apply route.      hydroCHLOROthiazide (MICROZIDE) 12.5 mg capsule  06/13/2021    Yes  Yes      Sig: Take 1 Capsule by mouth daily.      metoprolol succinate (TOPROL-XL) 25 mg XL tablet  06/13/2021    Yes  Yes      Sig: Take 1 Tablet by mouth daily.               Facility-Administered Medications: None           Hospital Medications:     Current  Facility-Administered Medications          Medication  Dose  Route  Frequency           ?  metoprolol succinate (TOPROL-XL) XL tablet 25 mg   25 mg  Oral  DAILY     ?  acetaminophen (OFIRMEV) infusion 1,000 mg   1,000 mg  IntraVENous  Q6H PRN           ?  pantoprazole (PROTONIX) 40 mg in 0.9% sodium chloride 10 mL injection   40 mg  IntraVENous  Q12H           Family History:   History reviewed. No pertinent family history.      Social History:     Social History          Tobacco Use         ?  Smoking status:  Never     ?  Smokeless tobacco:  Never       Substance Use Topics         ?  Alcohol use:  Not on file           Review of Systems:   Fever   Chills   HA   Dizziness   Good mood   Rash   Jaundice   Joint pain   Weight loss   Odynophagia   Dysphagia   Heartburn   Chest pain   Dyspnea   N/V   Hematemesis   Abd pain   Diarrhea   Constipation   Dysuria   Melena   Hematochezia   The rest of the review of systems is negative except per HPI           Objective:     Patient Vitals for the past 8 hrs:            BP  Temp  Pulse  Resp  SpO2            06/14/21 0755  (!) 144/94  97 F (36.1 C)  65  18  96 %            06/14/21 0427  119/88  97.5 F (36.4 C)  68  20  95 %        No intake/output data recorded.   No intake/output data recorded.         PHYSICAL EXAM:   General: Alert, in no acute distress     HEENT: Anicteric conjunctiva.   Lungs:            CTA Bilaterally   Heart:  Normal S1, S2     Abdomen: Soft, Non distended, Non tender.  Normoactive bowel sounds, no HSM,    no rebound/guarding   MSK:   Normal muscle tone   Skin:   Warm to touch   Extremities: Negative bilateral pedal edema    Psych:   Good insight. Not anxious nor agitated.      Lab Data Reviewed:      Recent Labs            06/14/21   0621  06/13/21   1737     WBC  7.6  7.6     HGB  12.8*  12.9*     HCT  36.3*  38.6*         PLT  230  234          Recent Labs            06/14/21   0621  06/13/21   1159     NA  139  135*     K  3.7  4.3     CL   105  103     CO2  29  23  BUN  10  10     CREA  1.01  0.98     GLU  96  99         CA  8.3*  8.6*          Recent Labs            06/14/21   0621  06/13/21   1159     AP  87  98     TP  6.4  7.5     ALB  3.3*  3.8     GLOB  3.1  3.7         LPSE   --   30          Recent Labs           06/13/21   1159     INR  1.0     PTP  10.1        APTT  26.0           Recent Labs           06/14/21   0621     TIBC  282        PSAT  39        No results for input(s): CPK, CKMB in the last 72 hours.      No lab exists for component: TOPONINI      See Electronic Medical Record for all procedure/radiology reports and details which were not copied into this note but were reviewed prior to the creation of the Plan.           Assessment:         Black tarry bowel movements for 48 hours     Draining perianal fistula      S/P colonoscopy one month ago  - one polyp          Patient Active Problem List        Diagnosis  Code         ?  GI (gastrointestinal bleed)  K92.2             Plan:         Stool c&s, WBC     Maintain PPI     EGD in the AM     Thank you for the consultation.  Please call with any questions.        Signed by: Roxine CaddyWilliam Shameek Nyquist, MD          06/14/2021  10:08 AM

## 2021-06-14 NOTE — Progress Notes (Signed)
Progress Notes by Beryle Quant, RN at 06/14/21 1241                Author: Beryle Quant, RN  Service: --  Author Type: Registered Nurse       Filed: 06/14/21 1241  Date of Service: 06/14/21 1241  Status: Signed          Editor: Beryle Quant, RN (Registered Nurse)                  Problem: Risk for Spread of Infection   Goal: Prevent transmission of infectious organism to others   Description: Prevent the transmission of infectious organisms to other patients, staff members, and visitors.   Outcome: Progressing Towards Goal       Problem: Patient Education:  Go to Education Activity   Goal: Patient/Family Education   Outcome: Progressing Towards Goal

## 2021-06-14 NOTE — Progress Notes (Signed)
Progress  Notes by [employer], Sophia at 06/14/21 1030                Author: Philips, Sophia  Service: --  Chartered loss adjuster Type: Care Management       Filed: 06/14/21 1043  Date of Service: 06/14/21 1030  Status: Signed          Editor: Philips, Sophia (Care Management)                  CASE MANAGEMENT PSYCHOSOCIAL ASSESSMENT         Travius Crochet                    Admission Date:  06/13/2021   MRN:              [id number]   Date of Birth:  [birth year]   Current date:  06/14/2021      DISCHARGE PLAN: Home      Social worker spoke with patient. 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 Patient resides with his mother. He has no stairs inside or outside of the home. PTA, patient was independent and did not need assistance  with ADLs or ambulation. No hx of DME/rehab/HHC. Upon discharge, patient is anticipated to drive himself home. CM remains available if any needs arise.               Patient Information:       Patients Preferred Name: Brandon Ross     Patient Arrived Via: Lexicographer Obtained From: Patient     Patient Objects to Receiving Blood: No     MRSA Assessment: No Known History of MRSA     Auditory Impairment: None                           Retired Read Only-Readmit Risk Tool   Support Systems: Parent(s)   History of Falls Within Past 3 Months: No   Needs Assistance with Wound Care AND/OR Mgnt of O2, Nebulizer: No   Requires Financial, Physical and/or Educational Assistance With Medications: No   History of Mental Illness: No   Living Alone: Yes      PCP:         Other, Phys, MD       Admitting Provider:  Charlyne Petrin, MD   [facility]       HOMECARE PTA:    none   Payor:                      Pay[place]ICA[place] / Plan: [place] MEDICA[place] / Product Type: Medicaid /    Secondary Payor:    @[email or web address] @      GI (gastrointestinal bleed) [K92.2]     Patient Active Problem List        Diagnosis  Code         ?  GI  (gastrointestinal bleed)  K92.2          Social History          Substance and Sexual Activity        Alcohol Use  None        Number of stairs into patient home:     DME:   Cohabitants:       Abuse Screen:  Physical Abuse/Neglect: Denies   Sexual Abuse: Denies   Verbal Abuse: Denies   Other Abuse/Issues: Denies      INSURANCE INFORMATION:  (Verification)           Primary  Notes:     Secondary  Notes:     Workmen's Comp  Notes:     No-Fault  Notes:     SSD  Notes:     Un-Insured  Notes:        Long-Term Engineer, manufacturing systems If Applicable  Notes:        Current Functioning:         Language barriers:  Notes:        Understanding nature/impact of illness:  Notes:     Ability to participate in plan:  Notes:     Realistic Problem solving and planning:  Notes:     Adequate coping skills:  Notes:     Religious/Cultural barriers:  Notes:        If unable to assess or not applicable:  Notes:             Suicide Assessment:                                                                  Readmit Risk:     Support Systems: Parent(s)                                                                    Advanced Care Planning:     Confirm Advance Directive: None                        Discussed with the patient and all questions fully answered. He will call me if any problems arise.         Care Management Interventions   Mode of Transport at Discharge: Other (see comment) (Drove himself )   Transition of Care Consult (CM Consult): Discharge Planning   Support Systems: Parent(s)   Confirm Follow Up Transport: Self   The Patient and/or Patient Representative was Provided with a Choice of Provider and Agrees with the Discharge Plan?: Yes   Freedom of Choice List was Provided with Basic Dialogue that Supports the Patient's Individualized Plan of Care/Goals, Treatment Preferences and Shares the Quality Data Associated with the Providers?: Yes   Veteran Resource Information Provided?: Refused   Discharge Location   Patient Expects to be  Discharged to:: Home

## 2021-06-14 NOTE — Progress Notes (Signed)
Progress Notes by Carrington Clamp, PA-C at 06/14/21 1827                Author: Carrington Clamp, PA-C  Service: Physician Assistant  Author Type: Physician Assistant       Filed: 06/14/21 1829  Date of Service: 06/14/21 1827  Status: Signed           Editor: Carrington Clamp, PA-C (Physician Assistant)  Cosigner: De Hollingshead, MD at 06/22/21 [phone]               Patient notified of family emergency, requested to sign out AMA. All risks discussed, advised to seek emergency medical attention if any worsening symptoms. AMA paperwork completed.

## 2021-06-15 LAB — CULTURE, STOOL

## 2021-06-16 LAB — CULTURE, STOOL

## 2021-06-16 LAB — OVA & PARASITES, STOOL, REFLEX

## 2021-06-16 LAB — OVA AND PARASITE SCREEN

## 2021-06-16 LAB — OVA & PARASITES, STOOL

## 2021-06-17 ENCOUNTER — Emergency Department (HOSPITAL_COMMUNITY)
Admission: EM | Admit: 2021-06-17 | Discharge: 2021-06-17 | Disposition: A | Discharge status: Home / Self Care | Payer: Medicaid Other

## 2021-06-17 NOTE — ED Notes (Signed)
Not in waiting room x 1

## 2021-06-17 NOTE — ED Notes (Signed)
Not in waiting room x 2

## 2021-12-22 ENCOUNTER — Other Ambulatory Visit: Payer: Self-pay

## 2021-12-22 ENCOUNTER — Encounter (HOSPITAL_COMMUNITY): Payer: Self-pay

## 2021-12-22 ENCOUNTER — Emergency Department (HOSPITAL_COMMUNITY)
Admission: EM | Admit: 2021-12-22 | Discharge: 2021-12-22 | Disposition: A | Discharge status: Home / Self Care | Payer: Medicaid Other | Attending: Emergency Medicine | Admitting: Emergency Medicine

## 2021-12-22 ENCOUNTER — Emergency Department (HOSPITAL_COMMUNITY): Payer: Medicaid Other

## 2021-12-22 DIAGNOSIS — J111 Influenza due to unidentified influenza virus with other respiratory manifestations: Secondary | ICD-10-CM

## 2021-12-22 DIAGNOSIS — J101 Influenza due to other identified influenza virus with other respiratory manifestations: Secondary | ICD-10-CM | POA: Insufficient documentation

## 2021-12-22 DIAGNOSIS — Z20822 Contact with and (suspected) exposure to covid-19: Secondary | ICD-10-CM | POA: Insufficient documentation

## 2021-12-22 LAB — RESP PANEL BY RT-PCR (RSV, FLU A&B, COVID)  RVPGX2
Influenza A by PCR: POSITIVE — AB
Influenza B by PCR: NEGATIVE
Resp Syncytial Virus by PCR: NEGATIVE
SARS Coronavirus 2 by RT PCR: NEGATIVE

## 2021-12-22 MED ORDER — HYDROCODONE BIT-HOMATROP MBR 5-1.5 MG/5ML PO SOLN
5.0000 mL | Freq: Once | ORAL | Status: AC
  Administered 2021-12-22: 5 mL via ORAL
  Filled 2021-12-22: qty 5

## 2021-12-22 MED ORDER — OSELTAMIVIR PHOSPHATE 75 MG PO CAPS
75.0000 mg | ORAL_CAPSULE | Freq: Once | ORAL | Status: AC
  Administered 2021-12-22: 75 mg via ORAL
  Filled 2021-12-22: qty 1

## 2021-12-22 MED ORDER — HYDROCODONE BIT-HOMATROP MBR 5-1.5 MG/5ML PO SOLN: 5.0000 mL | Freq: Four times a day (QID) | ORAL | 0 refills | Status: DC | PRN

## 2021-12-22 MED ORDER — ACETAMINOPHEN 500 MG PO TABS
1000.0000 mg | ORAL_TABLET | Freq: Once | ORAL | Status: AC
  Administered 2021-12-22: 1000 mg via ORAL
  Filled 2021-12-22: qty 2

## 2021-12-22 MED ORDER — OSELTAMIVIR PHOSPHATE 75 MG PO CAPS: 75.0000 mg | ORAL_CAPSULE | Freq: Two times a day (BID) | ORAL | 0 refills | Status: DC

## 2021-12-22 NOTE — ED Provider Notes (Signed)
[facility] EMERGENCY DEPARTMENT Provider Note   CSN: [id number] Arrival date & time: 12/22/21  0105     History  Chief Complaint  Patient presents with   Cough   Shortness of Breath    Fernando [name] is a 61 y.o. male.  61 yo M here with cough, body aches, fever, congestion and nausea for since yesterday. Subjective fevers at home. No known sick contacts. Quit smoking since this started.    Cough Associated symptoms: shortness of breath   Shortness of Breath Associated symptoms: cough        Home Medications Prior to Admission medications   Medication Sig Start Date End Date Taking? Authorizing Provider  albuterol (VENTOLIN HFA) 108 (90 Base) MCG/ACT inhaler Inhale 1-2 puffs into the lungs every 6 (six) hours as needed for wheezing or shortness of breath. 03/12/20   Henderly, Britni A, PA-C  ALPRAZolam (XANAX) 1 MG tablet Take 1 mg by mouth at bedtime as needed for anxiety.    [provider]  enalapril (VASOTEC) 10 MG tablet Take 10 mg by mouth daily.    [provider]  ibuprofen (ADVIL) 200 MG tablet Take 200 mg by mouth every 6 (six) hours as needed for fever, headache or mild pain.    [provider]  metoprolol succinate (TOPROL XL) 25 MG 24 hr tablet Take 1 tablet (25 mg total) by mouth daily. 01/14/21   Melrose Nakayama, MD      Allergies    Morphine and related    Review of Systems   Review of Systems  Respiratory:  Positive for cough and shortness of breath.     Physical Exam Updated Vital Signs BP 130/88   Pulse 100   Temp 99.8 F (37.7 C) (Oral)   Resp (!) 23   Ht '6\' 1"'$  (1.854 m)   Wt 113.4 kg   SpO2 95%   BMI 32.98 kg/m  Physical Exam Vitals and nursing note reviewed.  Constitutional:      Appearance: He is well-developed.  HENT:     Head: Normocephalic and atraumatic.  Cardiovascular:     Rate and Rhythm: Normal rate.  Pulmonary:     Effort: Pulmonary effort is normal. Tachypnea present. No respiratory  distress.     Breath sounds: No decreased breath sounds.  Chest:     Chest wall: No tenderness.  Abdominal:     General: There is no distension.  Musculoskeletal:        General: Normal range of motion.     Cervical back: Normal range of motion.     Right lower leg: No edema.     Left lower leg: No edema.  Neurological:     Mental Status: He is alert.     ED Results / Procedures / Treatments   Labs (all labs ordered are listed, but only abnormal results are displayed) Labs Reviewed  RESP PANEL BY RT-PCR (RSV, FLU A&B, COVID)  [id number] - Abnormal; Notable for the following components:      Result Value   Influenza A by PCR POSITIVE (*)    All other components within normal limits    EKG None  Radiology DG Chest Portable 1 View  Result Date: 12/22/2021 CLINICAL DATA:  Possible pneumonia EXAM: PORTABLE CHEST 1 VIEW COMPARISON:  07/30/2018, CT from 03/12/2020 FINDINGS: Cardiac shadow is within normal limits. Lungs are well aerated bilaterally. No focal infiltrate or effusion is seen. No acute bony abnormality is noted. Postsurgical changes in the  cervical spine. IMPRESSION: No acute abnormality noted Electronically Signed   By: Inez Catalina M.D.   On: 12/22/2021 03:14    Procedures Procedures    Medications Ordered in ED Medications  HYDROcodone bit-homatropine (HYCODAN) 5-1.5 MG/5ML syrup 5 mL (has no administration in time range)  oseltamivir (TAMIFLU) capsule 75 mg (has no administration in time range)  acetaminophen (TYLENOL) tablet 1,000 mg (has no administration in time range)    ED Course/ Medical Decision Making/ A&P                           Medical Decision Making Amount and/or Complexity of Data Reviewed Radiology: ordered.  Risk OTC drugs. Prescription drug management.   Influienza without obvious complications. Will dc on antitussives, tamifly and PRN antipyretics. No distress, appears stable for discharge.   Final Clinical Impression(s) / ED  Diagnoses Final diagnoses:  None    Rx / DC Orders ED Discharge Orders     None         Stran Raper, Corene Cornea, MD 12/22/21 [phone]

## 2021-12-22 NOTE — ED Triage Notes (Signed)
Pt presents with shortness of breath that started today after not feeling well over the last few days. Pt endorses cough, fever, nasal congestion.

## 2022-03-04 ENCOUNTER — Emergency Department (HOSPITAL_COMMUNITY): Payer: No Typology Code available for payment source

## 2022-03-04 ENCOUNTER — Encounter (HOSPITAL_COMMUNITY): Payer: Self-pay

## 2022-03-04 ENCOUNTER — Other Ambulatory Visit: Payer: Self-pay

## 2022-03-04 ENCOUNTER — Emergency Department (HOSPITAL_COMMUNITY)
Admission: EM | Admit: 2022-03-04 | Discharge: 2022-03-04 | Disposition: A | Discharge status: Home / Self Care | Payer: No Typology Code available for payment source | Attending: Emergency Medicine | Admitting: Emergency Medicine

## 2022-03-04 DIAGNOSIS — M545 Low back pain, unspecified: Secondary | ICD-10-CM | POA: Diagnosis present

## 2022-03-04 DIAGNOSIS — R200 Anesthesia of skin: Secondary | ICD-10-CM | POA: Diagnosis not present

## 2022-03-04 DIAGNOSIS — R202 Paresthesia of skin: Secondary | ICD-10-CM | POA: Diagnosis not present

## 2022-03-04 DIAGNOSIS — Y92410 Unspecified street and highway as the place of occurrence of the external cause: Secondary | ICD-10-CM | POA: Insufficient documentation

## 2022-03-04 LAB — CBC WITH DIFFERENTIAL/PLATELET
Abs Immature Granulocytes: 0.06 10*3/uL (ref 0.00–0.07)
Basophils Absolute: 0 10*3/uL (ref 0.0–0.1)
Basophils Relative: 0 %
Eosinophils Absolute: 0.3 10*3/uL (ref 0.0–0.5)
Eosinophils Relative: 4 %
HCT: 43.2 % (ref 39.0–52.0)
Hemoglobin: 14.4 g/dL (ref 13.0–17.0)
Immature Granulocytes: 1 %
Lymphocytes Relative: 22 %
Lymphs Abs: 1.9 10*3/uL (ref 0.7–4.0)
MCH: 31.3 pg (ref 26.0–34.0)
MCHC: 33.3 g/dL (ref 30.0–36.0)
MCV: 93.9 fL (ref 80.0–100.0)
Monocytes Absolute: 0.7 10*3/uL (ref 0.1–1.0)
Monocytes Relative: 8 %
Neutro Abs: 5.3 10*3/uL (ref 1.7–7.7)
Neutrophils Relative %: 65 %
Platelets: 305 10*3/uL (ref 150–400)
RBC: 4.6 MIL/uL (ref 4.22–5.81)
RDW: 13.9 % (ref 11.5–15.5)
WBC: 8.3 10*3/uL (ref 4.0–10.5)
nRBC: 0 % (ref 0.0–0.2)

## 2022-03-04 LAB — BASIC METABOLIC PANEL
Anion gap: 9 (ref 5–15)
BUN: 8 mg/dL (ref 6–20)
CO2: 28 mmol/L (ref 22–32)
Calcium: 8.5 mg/dL — ABNORMAL LOW (ref 8.9–10.3)
Chloride: 98 mmol/L (ref 98–111)
Creatinine, Ser: 1.08 mg/dL (ref 0.61–1.24)
GFR, Estimated: >60 mL/min (ref >60)
Glucose, Bld: 98 mg/dL (ref 70–99)
Potassium: 3.6 mmol/L (ref 3.5–5.1)
Sodium: 135 mmol/L (ref 135–145)

## 2022-03-04 MED ORDER — METHYLPREDNISOLONE 4 MG PO TBPK: ORAL_TABLET | ORAL | 0 refills | Status: DC

## 2022-03-04 NOTE — Discharge Instructions (Signed)
Follow-up with Dr. Christella Noa or one of his colleagues in the next 1 to 2 weeks
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
## 2022-03-04 NOTE — ED Notes (Signed)
Patient left prior to discharge paperwork. This RN was informed after patient was already gone

## 2022-06-20 ENCOUNTER — Emergency Department (HOSPITAL_COMMUNITY): Payer: Medicaid Other

## 2022-06-20 ENCOUNTER — Encounter (HOSPITAL_COMMUNITY): Payer: Self-pay | Admitting: *Deleted

## 2022-06-20 ENCOUNTER — Other Ambulatory Visit: Payer: Self-pay

## 2022-06-20 ENCOUNTER — Emergency Department (HOSPITAL_COMMUNITY)
Admission: EM | Admit: 2022-06-20 | Discharge: 2022-06-20 | Disposition: A | Discharge status: Home / Self Care | Payer: Medicaid Other | Attending: Emergency Medicine | Admitting: Emergency Medicine

## 2022-06-20 DIAGNOSIS — I10 Essential (primary) hypertension: Secondary | ICD-10-CM | POA: Insufficient documentation

## 2022-06-20 DIAGNOSIS — R109 Unspecified abdominal pain: Secondary | ICD-10-CM | POA: Diagnosis present

## 2022-06-20 DIAGNOSIS — J449 Chronic obstructive pulmonary disease, unspecified: Secondary | ICD-10-CM | POA: Diagnosis not present

## 2022-06-20 DIAGNOSIS — Z79899 Other long term (current) drug therapy: Secondary | ICD-10-CM | POA: Insufficient documentation

## 2022-06-20 DIAGNOSIS — E039 Hypothyroidism, unspecified: Secondary | ICD-10-CM | POA: Insufficient documentation

## 2022-06-20 DIAGNOSIS — K8500 Idiopathic acute pancreatitis without necrosis or infection: Secondary | ICD-10-CM | POA: Insufficient documentation

## 2022-06-20 LAB — COMPREHENSIVE METABOLIC PANEL
ALT: 39 U/L (ref 0–44)
AST: 37 U/L (ref 15–41)
Albumin: 3.8 g/dL (ref 3.5–5.0)
Alkaline Phosphatase: 116 U/L (ref 38–126)
Anion gap: 8 (ref 5–15)
BUN: 12 mg/dL (ref 6–20)
CO2: 25 mmol/L (ref 22–32)
Calcium: 8.4 mg/dL — ABNORMAL LOW (ref 8.9–10.3)
Chloride: 99 mmol/L (ref 98–111)
Creatinine, Ser: 1.14 mg/dL (ref 0.61–1.24)
GFR, Estimated: >60 mL/min (ref >60)
Glucose, Bld: 104 mg/dL — ABNORMAL HIGH (ref 70–99)
Potassium: 3.6 mmol/L (ref 3.5–5.1)
Sodium: 132 mmol/L — ABNORMAL LOW (ref 135–145)
Total Bilirubin: 0.8 mg/dL (ref 0.3–1.2)
Total Protein: 7.8 g/dL (ref 6.5–8.1)

## 2022-06-20 LAB — URINALYSIS, ROUTINE W REFLEX MICROSCOPIC
Bacteria, UA: NONE SEEN
Bilirubin Urine: NEGATIVE
Glucose, UA: NEGATIVE mg/dL
Ketones, ur: NEGATIVE mg/dL
Leukocytes,Ua: NEGATIVE
Nitrite: NEGATIVE
Protein, ur: NEGATIVE mg/dL
Specific Gravity, Urine: 1.041 — ABNORMAL HIGH (ref 1.005–1.030)
pH: 6 (ref 5.0–8.0)

## 2022-06-20 LAB — CBC
HCT: 45.7 % (ref 39.0–52.0)
Hemoglobin: 15.7 g/dL (ref 13.0–17.0)
MCH: 32.2 pg (ref 26.0–34.0)
MCHC: 34.4 g/dL (ref 30.0–36.0)
MCV: 93.6 fL (ref 80.0–100.0)
Platelets: 208 10*3/uL (ref 150–400)
RBC: 4.88 MIL/uL (ref 4.22–5.81)
RDW: 15.5 % (ref 11.5–15.5)
WBC: 10.1 10*3/uL (ref 4.0–10.5)
nRBC: 0 % (ref 0.0–0.2)

## 2022-06-20 LAB — TROPONIN I (HIGH SENSITIVITY)
Troponin I (High Sensitivity): 8 ng/L (ref <18)
Troponin I (High Sensitivity): 10 ng/L (ref <18)

## 2022-06-20 LAB — LIPASE, BLOOD: Lipase: 63 U/L — ABNORMAL HIGH (ref 11–51)

## 2022-06-20 MED ORDER — AMLODIPINE BESYLATE 5 MG PO TABS: 5.0000 mg | ORAL_TABLET | Freq: Every day | ORAL | 0 refills | Status: AC

## 2022-06-20 MED ORDER — IOHEXOL 300 MG/ML  SOLN
100.0000 mL | Freq: Once | INTRAMUSCULAR | Status: AC | PRN
  Administered 2022-06-20: 100 mL via INTRAVENOUS

## 2022-06-20 MED ORDER — FENTANYL CITRATE PF 50 MCG/ML IJ SOSY
50.0000 ug | PREFILLED_SYRINGE | Freq: Once | INTRAMUSCULAR | Status: AC
  Administered 2022-06-20: 50 ug via INTRAVENOUS
  Filled 2022-06-20: qty 1

## 2022-06-20 MED ORDER — OXYCODONE-ACETAMINOPHEN 5-325 MG PO TABS: 1.0000 | ORAL_TABLET | Freq: Four times a day (QID) | ORAL | 0 refills | Status: DC | PRN

## 2022-06-20 MED ORDER — HYDROMORPHONE HCL 1 MG/ML IJ SOLN
1.0000 mg | Freq: Once | INTRAMUSCULAR | Status: AC
  Administered 2022-06-20: 1 mg via INTRAVENOUS
  Filled 2022-06-20: qty 1

## 2022-06-20 MED ORDER — ONDANSETRON HCL 4 MG/2ML IJ SOLN
4.0000 mg | Freq: Once | INTRAMUSCULAR | Status: AC
  Administered 2022-06-20: 4 mg via INTRAVENOUS
  Filled 2022-06-20: qty 2

## 2022-06-20 MED ORDER — OXYCODONE HCL 5 MG PO TABS
10.0000 mg | ORAL_TABLET | Freq: Once | ORAL | Status: AC
  Administered 2022-06-20: 10 mg via ORAL
  Filled 2022-06-20: qty 2

## 2022-06-20 MED ORDER — ONDANSETRON HCL 4 MG PO TABS: 4.0000 mg | ORAL_TABLET | Freq: Four times a day (QID) | ORAL | 0 refills | Status: DC

## 2022-06-20 NOTE — Discharge Instructions (Addendum)
You were seen in the ER for abdominal pain.  Your imaging showed findings concerning for pancreatitis. We normally treat this with pain and nausea medication, and bowel rest. I've attached some eating recommendations.  I am also sending a prescription for blood pressure medication to your pharmacy for the next month. Please follow up with your doctor about this.   Continue to monitor how you're doing and return to the ER for new or worsening symptoms such as inability to keep down liquids, worsening pain, fever, severe headache or chest pain.

## 2022-06-20 NOTE — ED Notes (Signed)
Pt states upper abdominal pain/ mid chest. Pt states SOB with this pain. Pt states a 10/10 pain. PA notified.

## 2022-06-20 NOTE — ED Provider Notes (Signed)
[facility] AT [facility] Provider Note   CSN: [id number] Arrival date & time: 06/20/22  1222     History  Chief Complaint  Patient presents with   Abdominal Pain    Fernando [name] is a 62 y.o. male with history of hypothyroidism, OSA, s/p left lower lobectomy, HTN, prior tobacco abuse, COPD who presents to the ER complaining of abdominal pain and weakness since yesterday. Increased belching with a metallic taste in the mouth, feeling bloated. Had a looser stool this morning, normal color. Hx of bleeding ulcers, not on blood thinners. Hx appendectomy and other abdominal surgeries, still has gallbladder.   Abdominal Pain Associated symptoms: fatigue and nausea   Associated symptoms: no chest pain        Home Medications Prior to Admission medications   Medication Sig Start Date End Date Taking? Authorizing Provider  amLODipine (NORVASC) 5 MG tablet Take 1 tablet (5 mg total) by mouth daily. 06/20/22  Yes Cniyah Sproull T, PA-C  ondansetron (ZOFRAN) 4 MG tablet Take 1 tablet (4 mg total) by mouth every 6 (six) hours. 06/20/22  Yes Tierrah Anastos T, PA-C  oxyCODONE-acetaminophen (PERCOCET/ROXICET) 5-325 MG tablet Take 1 tablet by mouth every 6 (six) hours as needed for severe pain. 06/20/22  Yes Oswald Pott T, PA-C  albuterol (VENTOLIN HFA) 108 (90 Base) MCG/ACT inhaler Inhale 1-2 puffs into the lungs every 6 (six) hours as needed for wheezing or shortness of breath. 03/12/20   Henderly, Britni A, PA-C  ALPRAZolam (XANAX) 1 MG tablet Take 1 mg by mouth at bedtime as needed for anxiety.    [provider]  enalapril (VASOTEC) 10 MG tablet Take 10 mg by mouth daily.    [provider]  HYDROcodone bit-homatropine (HYCODAN) 5-1.5 MG/5ML syrup Take 5 mLs by mouth every 6 (six) hours as needed for cough. 12/22/21   Mesner, Barbara Cower, MD  ibuprofen (ADVIL) 200 MG tablet Take 200 mg by mouth every 6 (six) hours as needed for fever, headache or  mild pain.    [provider]  methylPREDNISolone (MEDROL DOSEPAK) 4 MG TBPK tablet Take the Dosepak as directed.  6 pills the first day 5 pills the second day 4 pills the third day, 3 pills fourth day, 2 pills the fifth day, 1 pill 6-day 03/04/22   Bethann Berkshire, MD  metoprolol succinate (TOPROL XL) 25 MG 24 hr tablet Take 1 tablet (25 mg total) by mouth daily. 01/14/21   Loreli Slot, MD  oseltamivir (TAMIFLU) 75 MG capsule Take 1 capsule (75 mg total) by mouth every 12 (twelve) hours. 12/22/21   Mesner, Barbara Cower, MD      Allergies    Morphine and codeine    Review of Systems   Review of Systems  Constitutional:  Positive for fatigue.  Cardiovascular:  Negative for chest pain.  Gastrointestinal:  Positive for abdominal distention, abdominal pain and nausea.  All other systems reviewed and are negative.   Physical Exam Updated Vital Signs BP (!) 208/181   Pulse 70   Temp 98.1 F (36.7 C) (Oral)   Resp 14   Ht 6\' 2"  (1.88 m)   Wt 117.5 kg   SpO2 96%   BMI 33.25 kg/m  Physical Exam Vitals and nursing note reviewed.  Constitutional:      Appearance: Normal appearance.  HENT:     Head: Normocephalic and atraumatic.  Eyes:     Conjunctiva/sclera: Conjunctivae normal.  Cardiovascular:     Rate and  Rhythm: Normal rate and regular rhythm.  Pulmonary:     Effort: Pulmonary effort is normal. No respiratory distress.     Breath sounds: Normal breath sounds.  Abdominal:     General: There is no distension.     Palpations: Abdomen is soft.     Tenderness: There is abdominal tenderness in the epigastric area.  Skin:    General: Skin is warm and dry.  Neurological:     General: No focal deficit present.     Mental Status: He is alert.     ED Results / Procedures / Treatments   Labs (all labs ordered are listed, but only abnormal results are displayed) Labs Reviewed  LIPASE, BLOOD - Abnormal; Notable for the following components:      Result Value   Lipase 63  (*)    All other components within normal limits  COMPREHENSIVE METABOLIC PANEL - Abnormal; Notable for the following components:   Sodium 132 (*)    Glucose, Bld 104 (*)    Calcium 8.4 (*)    All other components within normal limits  URINALYSIS, ROUTINE W REFLEX MICROSCOPIC - Abnormal; Notable for the following components:   Specific Gravity, Urine 1.041 (*)    Hgb urine dipstick SMALL (*)    All other components within normal limits  CBC  TROPONIN I (HIGH SENSITIVITY)  TROPONIN I (HIGH SENSITIVITY)    EKG EKG Interpretation  Date/Time:  Tuesday June 20 2022 12:50:56 EDT Ventricular Rate:  81 PR Interval:  198 QRS Duration: 91 QT Interval:  384 QTC Calculation: 446 R Axis:   -25 Text Interpretation: Sinus rhythm Borderline left axis deviation Abnormal R-wave progression, early transition Baseline wander in lead(s) II similar to Mar 2022 Confirmed by Pricilla Loveless [phone]) on 06/20/2022 3:13:49 PM  Radiology CT ABDOMEN PELVIS W CONTRAST  Result Date: 06/20/2022 CLINICAL DATA:  Generalized abdominal pain and weakness. EXAM: CT ABDOMEN AND PELVIS WITH CONTRAST TECHNIQUE: Multidetector CT imaging of the abdomen and pelvis was performed using the standard protocol following bolus administration of intravenous contrast. RADIATION DOSE REDUCTION: This exam was performed according to the departmental dose-optimization program which includes automated exposure control, adjustment of the mA and/or kV according to patient size and/or use of iterative reconstruction technique. CONTRAST:  OMNIPAQUE IOHEXOL 300 MG/ML  SOLN COMPARISON:  March 12, 2020 FINDINGS: Lower chest: No acute abnormality. Hepatobiliary: No focal liver abnormality is seen. No gallstones, gallbladder wall thickening, or biliary dilatation. Pancreas: Mild peripancreatic inflammatory fat stranding is seen within the region of the pancreatic head. There is no evidence of pancreatic ductal dilatation. Spleen: Normal in size  without focal abnormality. Adrenals/Urinary Tract: Adrenal glands are unremarkable. Kidneys are normal in size, without renal calculi or hydronephrosis. A 9 mm diameter cyst is seen within the anterior aspect of the mid right kidney. Bladder is unremarkable. Stomach/Bowel: Stomach is within normal limits. The appendix is surgically absent. No evidence of bowel wall thickening, distention, or inflammatory changes. Noninflamed diverticula are seen within the proximal sigmoid colon. Vascular/Lymphatic: No significant vascular findings are present. No enlarged abdominal or pelvic lymph nodes. Reproductive: There is moderate to marked severity prostate gland enlargement. Other: No abdominal wall hernia or abnormality. No abdominopelvic ascites. Musculoskeletal: There is grade 1 anterolisthesis of the L5 vertebral body on S1 with multilevel degenerative changes noted throughout the lumbar spine. IMPRESSION: 1. Findings consistent with mild acute pancreatitis. Correlation with pancreatic enzymes is recommended. 2. Sigmoid diverticulosis. 3. Moderate to marked severity prostatomegaly. Correlation with PSA  levels is recommended. 4. Grade 1 anterolisthesis of the L5 vertebral body on S1 with multilevel degenerative changes throughout the lumbar spine. Electronically Signed   By: Aram Candela M.D.   On: 06/20/2022 17:16    Procedures Procedures    Medications Ordered in ED Medications  fentaNYL (SUBLIMAZE) injection 50 mcg (50 mcg Intravenous Given 06/20/22 1411)  HYDROmorphone (DILAUDID) injection 1 mg (1 mg Intravenous Given 06/20/22 1500)  ondansetron (ZOFRAN) injection 4 mg (4 mg Intravenous Given 06/20/22 1500)  iohexol (OMNIPAQUE) 300 MG/ML solution 100 mL (100 mLs Intravenous Contrast Given 06/20/22 1537)  oxyCODONE (Oxy IR/ROXICODONE) immediate release tablet 10 mg (10 mg Oral Given 06/20/22 1734)    ED Course/ Medical Decision Making/ A&P                             Medical Decision Making Amount  and/or Complexity of Data Reviewed Labs: ordered. Radiology: ordered.  Risk Prescription drug management.   This patient is a 62 y.o. male  who presents to the ED for concern of upper abdominal pain.   Differential diagnoses prior to evaluation: The emergent differential diagnosis includes, but is not limited to,  PUD, GERD, gastritis, pancreatitis, gastroparesis, malignancy, biliary disease, ACS, pericarditis, pneumonia, intestinal ischemia, esophageal rupture, hepatitis . This is not an exhaustive differential.   Past Medical History / Co-morbidities / Social History: hypothyroidism, OSA, s/p left lower lobectomy, HTN, prior tobacco abuse, COPD  Additional history: Chart reviewed. Pertinent results include: Pt was previously on enalapril and HCTZ but these were discontinued. Started on olmesartan but reports he never took it.   Physical Exam: Physical exam performed. The pertinent findings include: Hypertensive, otherwise normal vitals. No headache or chest pain. Epigastric tenderness to palpation, abdomen mildly distended.   Lab Tests/Imaging studies: I personally interpreted labs/imaging and the pertinent results include:  no leukocytosis, normal hemoglobin. CMP unremarkable. Urinalysis unremarkable. Lipase 63. Initial troponin 8, delta troponin 10.  CT abdomen/pelvis with mild peripancreatic inflammatory fat stranding without ductal dilatation, cyst or necrosis. I agree with the radiologist interpretation.  Cardiac monitoring: EKG obtained and interpreted by my attending physician which shows: sinus rhythm   Medications: I ordered medication including IVF, pain medication, zofran.  I have reviewed the patients home medicines and have made adjustments as needed.   Disposition: After consideration of the diagnostic results and the patients response to treatment, I feel that emergency department workup does not suggest an emergent condition requiring admission or immediate  intervention beyond what has been performed at this time. The plan is: discharge to home with treatment of mild pancreatitis with pain medication, zofran, bowel rest. Will start on amlodipine for elevated blood pressure readings and encourage follow up with PCP. Patient agreeable to this. The patient is safe for discharge and has been instructed to return immediately for worsening symptoms, change in symptoms or any other concerns.  Final Clinical Impression(s) / ED Diagnoses Final diagnoses:  Idiopathic acute pancreatitis without infection or necrosis  Hypertension, unspecified type    Rx / DC Orders ED Discharge Orders          Ordered    amLODipine (NORVASC) 5 MG tablet  Daily        06/20/22 1814    oxyCODONE-acetaminophen (PERCOCET/ROXICET) 5-325 MG tablet  Every 6 hours PRN        06/20/22 1814    ondansetron (ZOFRAN) 4 MG tablet  Every 6 hours  06/20/22 1814           Portions of this report may have been transcribed using voice recognition software. Every effort was made to ensure accuracy; however, inadvertent computerized transcription errors may be present.    Jeanella Flattery 06/20/22 1819    Bethann Berkshire, MD 06/24/22 1237

## 2022-06-20 NOTE — ED Triage Notes (Signed)
Pt with generalized abd pain and generalized weakness since yesterday.  + metallic taste to mouth, + belching, + feeling bloated. Loose stool x 1 this morning.

## 2022-06-20 NOTE — ED Notes (Signed)
Pt instructed for need for urine sample, pt verbalized understanding.

## 2022-06-20 NOTE — ED Notes (Signed)
Patient transported to CT 

## 2022-06-20 NOTE — ED Notes (Signed)
Pt back from CT

## 2022-06-21 ENCOUNTER — Observation Stay (HOSPITAL_COMMUNITY)
Admission: EM | Admit: 2022-06-21 | Discharge: 2022-06-22 | Discharge status: Against Medical Advice | Payer: Medicaid Other | Attending: Internal Medicine | Admitting: Internal Medicine

## 2022-06-21 ENCOUNTER — Other Ambulatory Visit: Payer: Self-pay

## 2022-06-21 ENCOUNTER — Emergency Department (HOSPITAL_COMMUNITY): Payer: Medicaid Other

## 2022-06-21 ENCOUNTER — Encounter (HOSPITAL_COMMUNITY): Payer: Self-pay | Admitting: Emergency Medicine

## 2022-06-21 DIAGNOSIS — K603 Anal fistula: Secondary | ICD-10-CM | POA: Diagnosis not present

## 2022-06-21 DIAGNOSIS — Z87891 Personal history of nicotine dependence: Secondary | ICD-10-CM | POA: Diagnosis not present

## 2022-06-21 DIAGNOSIS — I10 Essential (primary) hypertension: Secondary | ICD-10-CM | POA: Insufficient documentation

## 2022-06-21 DIAGNOSIS — E039 Hypothyroidism, unspecified: Secondary | ICD-10-CM | POA: Insufficient documentation

## 2022-06-21 DIAGNOSIS — Z902 Acquired absence of lung [part of]: Secondary | ICD-10-CM | POA: Insufficient documentation

## 2022-06-21 DIAGNOSIS — Z79899 Other long term (current) drug therapy: Secondary | ICD-10-CM | POA: Diagnosis not present

## 2022-06-21 DIAGNOSIS — R1030 Lower abdominal pain, unspecified: Secondary | ICD-10-CM | POA: Diagnosis present

## 2022-06-21 DIAGNOSIS — K859 Acute pancreatitis without necrosis or infection, unspecified: Secondary | ICD-10-CM | POA: Diagnosis not present

## 2022-06-21 DIAGNOSIS — E876 Hypokalemia: Secondary | ICD-10-CM | POA: Insufficient documentation

## 2022-06-21 DIAGNOSIS — J449 Chronic obstructive pulmonary disease, unspecified: Secondary | ICD-10-CM | POA: Diagnosis not present

## 2022-06-21 HISTORY — DX: Acute pancreatitis without necrosis or infection, unspecified: K85.90

## 2022-06-21 LAB — TROPONIN I (HIGH SENSITIVITY)
Troponin I (High Sensitivity): 12 ng/L (ref <18)
Troponin I (High Sensitivity): 11 ng/L (ref <18)

## 2022-06-21 LAB — COMPREHENSIVE METABOLIC PANEL
ALT: 37 U/L (ref 0–44)
AST: 32 U/L (ref 15–41)
Albumin: 4 g/dL (ref 3.5–5.0)
Alkaline Phosphatase: 125 U/L (ref 38–126)
Anion gap: 10 (ref 5–15)
BUN: 12 mg/dL (ref 6–20)
CO2: 26 mmol/L (ref 22–32)
Calcium: 8.9 mg/dL (ref 8.9–10.3)
Chloride: 95 mmol/L — ABNORMAL LOW (ref 98–111)
Creatinine, Ser: 1.19 mg/dL (ref 0.61–1.24)
GFR, Estimated: >60 mL/min (ref >60)
Glucose, Bld: 122 mg/dL — ABNORMAL HIGH (ref 70–99)
Potassium: 3.4 mmol/L — ABNORMAL LOW (ref 3.5–5.1)
Sodium: 131 mmol/L — ABNORMAL LOW (ref 135–145)
Total Bilirubin: 0.6 mg/dL (ref 0.3–1.2)
Total Protein: 8.5 g/dL — ABNORMAL HIGH (ref 6.5–8.1)

## 2022-06-21 LAB — CBC
HCT: 46.2 % (ref 39.0–52.0)
Hemoglobin: 15.7 g/dL (ref 13.0–17.0)
MCH: 31.8 pg (ref 26.0–34.0)
MCHC: 34 g/dL (ref 30.0–36.0)
MCV: 93.7 fL (ref 80.0–100.0)
Platelets: 200 10*3/uL (ref 150–400)
RBC: 4.93 MIL/uL (ref 4.22–5.81)
RDW: 15.5 % (ref 11.5–15.5)
WBC: 10.6 10*3/uL — ABNORMAL HIGH (ref 4.0–10.5)
nRBC: 0 % (ref 0.0–0.2)

## 2022-06-21 LAB — LIPASE, BLOOD: Lipase: 86 U/L — ABNORMAL HIGH (ref 11–51)

## 2022-06-21 LAB — HIV ANTIBODY (ROUTINE TESTING W REFLEX): HIV Screen 4th Generation wRfx: NONREACTIVE

## 2022-06-21 LAB — MAGNESIUM: Magnesium: 1.7 mg/dL (ref 1.7–2.4)

## 2022-06-21 MED ORDER — HYDROMORPHONE HCL 1 MG/ML IJ SOLN
1.0000 mg | Freq: Once | INTRAMUSCULAR | Status: AC
  Administered 2022-06-21: 1 mg via INTRAVENOUS
  Filled 2022-06-21 (×2): qty 1

## 2022-06-21 MED ORDER — DEXTROSE-SODIUM CHLORIDE 5-0.9 % IV SOLN: INTRAVENOUS | Status: DC

## 2022-06-21 MED ORDER — ENOXAPARIN SODIUM 60 MG/0.6ML IJ SOSY
60.0000 mg | PREFILLED_SYRINGE | INTRAMUSCULAR | Status: DC
  Administered 2022-06-21: 60 mg via SUBCUTANEOUS
  Filled 2022-06-21: qty 0.6

## 2022-06-21 MED ORDER — MAGNESIUM SULFATE 2 GM/50ML IV SOLN
2.0000 g | Freq: Once | INTRAVENOUS | Status: AC
  Administered 2022-06-21: 2 g via INTRAVENOUS
  Filled 2022-06-21: qty 50

## 2022-06-21 MED ORDER — LACTATED RINGERS IV BOLUS
1000.0000 mL | Freq: Once | INTRAVENOUS | Status: AC
  Administered 2022-06-21: 1000 mL via INTRAVENOUS

## 2022-06-21 MED ORDER — AMLODIPINE BESYLATE 5 MG PO TABS
5.0000 mg | ORAL_TABLET | Freq: Every day | ORAL | Status: DC
  Administered 2022-06-21: 5 mg via ORAL
  Filled 2022-06-21: qty 1

## 2022-06-21 MED ORDER — ONDANSETRON HCL 4 MG/2ML IJ SOLN
4.0000 mg | Freq: Once | INTRAMUSCULAR | Status: AC
  Administered 2022-06-21: 4 mg via INTRAVENOUS
  Filled 2022-06-21: qty 2

## 2022-06-21 MED ORDER — DOCUSATE SODIUM 100 MG PO CAPS
100.0000 mg | ORAL_CAPSULE | Freq: Two times a day (BID) | ORAL | Status: DC
  Administered 2022-06-21 (×2): 100 mg via ORAL
  Filled 2022-06-21 (×2): qty 1

## 2022-06-21 MED ORDER — SODIUM CHLORIDE 0.9 % IV SOLN: Freq: Once | INTRAVENOUS | Status: AC

## 2022-06-21 MED ORDER — HYDRALAZINE HCL 20 MG/ML IJ SOLN
10.0000 mg | Freq: Four times a day (QID) | INTRAMUSCULAR | Status: DC | PRN
  Administered 2022-06-21: 10 mg via INTRAVENOUS
  Filled 2022-06-21: qty 1

## 2022-06-21 MED ORDER — ACETAMINOPHEN 650 MG RE SUPP: 650.0000 mg | Freq: Four times a day (QID) | RECTAL | Status: DC | PRN

## 2022-06-21 MED ORDER — HYDROMORPHONE HCL 1 MG/ML IJ SOLN
1.0000 mg | INTRAMUSCULAR | Status: DC | PRN
  Administered 2022-06-21 – 2022-06-22 (×5): 1 mg via INTRAVENOUS
  Filled 2022-06-21 (×5): qty 1

## 2022-06-21 MED ORDER — ALPRAZOLAM 1 MG PO TABS
1.0000 mg | ORAL_TABLET | Freq: Three times a day (TID) | ORAL | Status: DC | PRN
  Administered 2022-06-21: 1 mg via ORAL
  Filled 2022-06-21: qty 1

## 2022-06-21 MED ORDER — IOHEXOL 300 MG/ML  SOLN
100.0000 mL | Freq: Once | INTRAMUSCULAR | Status: AC | PRN
  Administered 2022-06-21: 100 mL via INTRAVENOUS

## 2022-06-21 MED ORDER — ONDANSETRON HCL 4 MG/2ML IJ SOLN: 4.0000 mg | Freq: Four times a day (QID) | INTRAMUSCULAR | Status: DC | PRN

## 2022-06-21 MED ORDER — SENNOSIDES-DOCUSATE SODIUM 8.6-50 MG PO TABS: 1.0000 | ORAL_TABLET | Freq: Every evening | ORAL | Status: DC | PRN

## 2022-06-21 MED ORDER — ACETAMINOPHEN 325 MG PO TABS: 650.0000 mg | ORAL_TABLET | Freq: Four times a day (QID) | ORAL | Status: DC | PRN

## 2022-06-21 MED ORDER — HYDROMORPHONE HCL 1 MG/ML IJ SOLN
1.0000 mg | Freq: Once | INTRAMUSCULAR | Status: AC
  Administered 2022-06-21: 1 mg via INTRAVENOUS
  Filled 2022-06-21: qty 1

## 2022-06-21 MED ORDER — ORAL CARE MOUTH RINSE: 15.0000 mL | OROMUCOSAL | Status: DC | PRN

## 2022-06-21 MED ORDER — POTASSIUM CHLORIDE 10 MEQ/100ML IV SOLN
10.0000 meq | INTRAVENOUS | Status: AC
  Administered 2022-06-21 (×3): 10 meq via INTRAVENOUS
  Filled 2022-06-21 (×3): qty 100

## 2022-06-21 NOTE — ED Triage Notes (Signed)
Pt d/c yesterday with dx of acute pancreatitis. Pt states that pain has intensified since d/c with no relief. Denies any emesis at this time.

## 2022-06-21 NOTE — ED Notes (Signed)
Patient transported to CT 

## 2022-06-21 NOTE — H&P (Addendum)
History and Physical    Patient: Fernando [name] UUV:[id number] DOB: [birth year] DOA: 06/21/2022 DOS: the patient was seen and examined on 06/21/2022 PCP: Shawnie Dapper, PA-C   Patient coming from: Home  Chief Complaint:  Chief Complaint  Patient presents with   Abdominal Pain    R/t pacreatitis   HPI: Fernando [name] is a 62 y.o. male with medical history significant of mild chronic respiratory failure, anal fistula, hypothyroidism, hypertension, he was taking blood pressure medication, history of alcohol use presents on 6/11 to the ED complaining of abdominal pain diagnosed with mild pancreatitis and discharged home with pain medication.  He was not able to get pain medication from pharmacy tonight.  He presents today with worsening abdominal pain, pressure-like, associated with nausea, denies vomiting.  He reports epigastric and lower quadrant pain 10 out of 10 in intensity.  His last alcoholic drink was 2 months ago.  Evaluation in the ED: Sodium 131, potassium 3.4, lipase 86, troponin x 2 negative, white blood cell 10, hemoglobin 15.  CT abdomen and pelvis:  Slight increase in mild peripancreatic inflammatory/edematous changes. No pseudocyst.  Bilateral nephrolithiasis. Prostate enlargement.  Patient admitted for acute pancreatitis   Review of Systems: As mentioned in the history of present illness. All other systems reviewed and are negative. Past Medical History:  Diagnosis Date   Acute respiratory failure with hypoxia (HCC) 08/27/2015   Mild-Oxygen saturation 88% with ambulation.   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    Pancreatitis    Peri-rectal abscess    multiple.    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   Tobacco abuse    Umbilical hernia    has been repaired   Past Surgical History:  Procedure Laterality Date   ABSCESS DRAINAGE     APPENDECTOMY     CERVICAL FUSION     COLONOSCOPY     CYSTOSCOPY N/A 05/01/2013   Procedure: CYSTOSCOPY;  Surgeon: Ky Barban, MD;  Location: [facility];  Service: Urology;  Laterality: N/A;   EVALUATION UNDER ANESTHESIA WITH FISTULECTOMY N/A 06/23/2015   Procedure: ANAL EXAM UNDER ANESTHESIA  FISTULOTOMY;  Surgeon: Romie Levee, MD;  Location: [facility];  Service: General;  Laterality: N/A;   FACIAL COSMETIC SURGERY     HERNIA REPAIR     INCISION AND DRAINAGE ABSCESS Right 05/01/2013   Procedure: INCISION AND DRAINAGE RIGHT SCROTAL ABSCESS;  Surgeon: Ky Barban, MD;  Location: [facility];  Service: Urology;  Laterality: Right;   INCISION AND DRAINAGE PERIRECTAL ABSCESS     KNEE ARTHROSCOPY     SPINE SURGERY     cervical   VIDEO ASSISTED THORACOSCOPY (VATS)/ LOBECTOMY Left 05/03/2015   Procedure: LEFT VIDEO ASSISTED THORACOSCOPY (VATS)/ LEFT LOWER LOBECTOMY, ON-Q CATHETER PLACEMENT;  Surgeon: Loreli Slot, MD;  Location: [facility];  Service: Thoracic;  Laterality: Left;   VIDEO BRONCHOSCOPY WITH ENDOBRONCHIAL ULTRASOUND N/A 04/22/2015   Procedure: VIDEO BRONCHOSCOPY WITH ENDOBRONCHIAL ULTRASOUND;  Surgeon:  Loreli Slot, MD;  Location: [facility] OR;  Service: Thoracic;  Laterality: N/A;   Social History:  reports that he quit smoking about 7 years ago. His smoking use included cigarettes. He has a 7.50 pack-year smoking history. He has never used smokeless tobacco. He reports current alcohol use. He reports that he does not use drugs.  Allergies  Allergen Reactions   Morphine And Codeine Other (See Comments)    hallucinations    Family History  Problem Relation Age of  Onset   Breast cancer Mother    Thyroid disease Mother     Prior to Admission medications   Medication Sig Start Date End Date Taking? Authorizing Provider  ALPRAZolam Prudy Feeler) 1 MG tablet Take 1 mg by mouth in the morning, at noon, and at bedtime.   Yes [provider]  amLODipine (NORVASC) 5 MG tablet Take 1 tablet (5 mg total) by mouth daily. 06/20/22   Roemhildt, Lorin T, PA-C  oxyCODONE-acetaminophen (PERCOCET/ROXICET) 5-325 MG tablet Take 1 tablet by mouth every 6 (six) hours as needed for severe pain. 06/20/22   Roemhildt, Lorin T, PA-C    Physical Exam: Vitals:   06/21/22 0930 06/21/22 0944 06/21/22 1002 06/21/22 1525  BP: (!) 124/93  (!) 152/101 (!) 139/104  Pulse: (!) 59  68 71  Resp: 18  20 20   Temp:  97.6 F (36.4 C) 97.8 F (36.6 C) 97.8 F (36.6 C)  TempSrc:  Oral Oral Oral  SpO2: 94%  96% 94%  Weight:      Height:       General; NAD CVS; S 1, S 2 RRR Lungs; CTA Abdomen; soft, tender, obese, distended, no rigidity.  Extremity; no edema.  Neuro; alert, conversant, follows command.    Data Reviewed:  Admission labs reviewed.   Assessment and Plan: No notes have been filed under this hospital service. Service: Hospitalist  1-Acute Pancreatitis -Patient presented with abdominal pain, nausea, lipase 86.  CT abdomen and pelvis consistent with mild pancreatitis, no gallstone. Denies recent alcohol use.  -Continue with n.p.o., IV fluids -IV Dilaudid, PRN Zofran.   2-Hypokalemia; Replete IV>  3-Hypomagnesemia; Replete IV>  4-HTN; His PCP discontinue his medications, because his BP was normal.  He has been started on Norvasc.  5-Hyponatremia; IV fluids.    Advance Care Planning:   Code Status: Full Code   Consults: None  Family Communication: Care discussed with patient.   Severity of Illness: The appropriate patient status for this patient is OBSERVATION. Observation status is judged to be reasonable and necessary in order to provide the  required intensity of service to ensure the patient's safety. The patient's presenting symptoms, physical exam findings, and initial radiographic and laboratory data in the context of their medical condition is felt to place them at decreased risk for further clinical deterioration. Furthermore, it is anticipated that the patient will be medically stable for discharge from the hospital within 2 midnights of admission.   Author: Alba Cory, MD 06/21/2022 4:52 PM  For on call review [email or web address].

## 2022-06-21 NOTE — ED Provider Notes (Signed)
  Physical Exam  BP (!) 154/104 (BP Location: Left Arm)   Pulse 78   Temp (!) 97.4 F (36.3 C)   Resp 16   Ht 6\' 2"  (1.88 m)   Wt 117.5 kg   SpO2 97%   BMI 33.25 kg/m   Physical Exam  Procedures  Procedures  ED Course / MDM   Clinical Course as of 06/22/22 0912  Wed Jun 21, 2022  0347 Assumed care from Dr. Pilar Plate.  62 year old male with a history of alcohol abuse, pancreatitis, COPD, hypertension who presented to the emergency department with abdominal distention and nausea and vomiting.  Yesterday did have a CT scan that showed possible mild pancreatitis.  Lipase's have been marginally elevated.  Says he has not had a bowel movement in several days and on exam initially had significant distention and said that he felt that his abdomen was going to explode.  Abdominal x-ray was obtained which did show abnormally dilated loops of bowel concerning for ileus.  Will obtain repeat CT scan at this time to ensure that he does not have a bowel obstruction.  Patient is requiring multiple rounds of pain medication and may require admission. [RP]  0806 CT scan without any evidence of bowel obstruction.  Does show pancreatitis again.  Patient still complaining of persistent pain and nausea.  Dr Sunnie Nielsen from hospitalist will admit the patient. [RP]    Clinical Course User Index [RP] Rondel Baton, MD   Medical Decision Making Amount and/or Complexity of Data Reviewed Labs: ordered. Radiology: ordered.  Risk Prescription drug management. Decision regarding hospitalization.      Rondel Baton, MD 06/22/22 [phone]

## 2022-06-21 NOTE — ED Provider Notes (Signed)
[facility] Emergency Department Provider Note MRN:  [id number]  Arrival date & time: 06/21/22     Chief Complaint   Abdominal Pain (R/t pacreatitis)   History of Present Illness   Fernando [name] is a 62 y.o. year-old male with no pertinent past medical history presenting to the ED with chief complaint of abdominal pain.  Worsening abdominal pain over the past 24 to 48 hours.  Was here in the emergency department diagnosed with pancreatitis, was feeling a lot better and decided to go home.  Pain got a lot worse.  No bowel movements for the past few days.  Review of Systems  A thorough review of systems was obtained and all systems are negative except as noted in the HPI and PMH.   Patient's Health History    Past Medical History:  Diagnosis Date   Acute respiratory failure with hypoxia (HCC) 08/27/2015   Mild-Oxygen saturation 88% with ambulation.   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   Pancreatitis    Peri-rectal abscess    multiple.    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   Tobacco abuse    Umbilical hernia    has been repaired    Past Surgical History:  Procedure Laterality Date   ABSCESS DRAINAGE     APPENDECTOMY     CERVICAL FUSION     COLONOSCOPY     CYSTOSCOPY N/A 05/01/2013   Procedure: CYSTOSCOPY;  Surgeon: Ky Barban, MD;  Location: [facility];  Service: Urology;  Laterality: N/A;   EVALUATION UNDER ANESTHESIA WITH FISTULECTOMY N/A 06/23/2015   Procedure: ANAL EXAM UNDER ANESTHESIA  FISTULOTOMY;  Surgeon: Romie Levee, MD;  Location: [facility];  Service: General;  Laterality: N/A;   FACIAL COSMETIC SURGERY     HERNIA REPAIR     INCISION AND DRAINAGE ABSCESS Right 05/01/2013   Procedure: INCISION AND DRAINAGE RIGHT SCROTAL ABSCESS;  Surgeon: Ky Barban, MD;  Location: [facility];  Service: Urology;  Laterality: Right;   INCISION AND DRAINAGE PERIRECTAL ABSCESS     KNEE ARTHROSCOPY     SPINE SURGERY     cervical   VIDEO ASSISTED THORACOSCOPY (VATS)/ LOBECTOMY Left 05/03/2015   Procedure: LEFT VIDEO ASSISTED THORACOSCOPY (VATS)/ LEFT LOWER LOBECTOMY, ON-Q CATHETER PLACEMENT;  Surgeon: Loreli Slot, MD;  Location: [facility];  Service: Thoracic;  Laterality: Left;   VIDEO BRONCHOSCOPY WITH ENDOBRONCHIAL ULTRASOUND N/A 04/22/2015   Procedure: VIDEO BRONCHOSCOPY WITH ENDOBRONCHIAL ULTRASOUND;  Surgeon: Loreli Slot, MD;  Location: [facility] OR;  Service: Thoracic;  Laterality: N/A;    Family History  Problem Relation Age of Onset   Breast cancer Mother    Thyroid disease Mother     Social History   Socioeconomic History   Marital status: Divorced    Spouse name: Not on file   Number of children: 2   Years of education: 10   Highest education level: Not on file  Occupational History  Occupation: N/A  Tobacco Use   Smoking status: Former    Packs/day: 0.50    Years: 15.00    Additional pack years: 0.00    Total pack years: 7.50    Types: Cigarettes    Quit date: 03/05/2015    Years since quitting: 7.3   Smokeless tobacco: Never  Vaping Use   Vaping Use: Never used  Substance and Sexual Activity   Alcohol use: Yes    Alcohol/week: 0.0 standard drinks of alcohol   Drug use: No   Sexual activity: Never    Birth control/protection: None, Abstinence  Other Topics Concern   Not on file  Social History Narrative   Rare caffeine use    Social Determinants of Health   Financial Resource Strain: Not on file  Food Insecurity: Not on file  Transportation Needs: Not  on file  Physical Activity: Not on file  Stress: Not on file  Social Connections: Not on file  Intimate Partner Violence: Not on file     Physical Exam   Vitals:   06/21/22 0537 06/21/22 0600  BP: (!) 164/151 (!) 143/100  Pulse: 76 73  Resp: (!) 28 17  Temp:    SpO2: 96% 92%    CONSTITUTIONAL: Well-appearing, NAD NEURO/PSYCH:  Alert and oriented x 3, no focal deficits EYES:  eyes equal and reactive ENT/NECK:  no LAD, no JVD CARDIO: Regular rate, well-perfused, normal S1 and S2 PULM:  CTAB no wheezing or rhonchi GI/GU: Protuberant abdomen, distended, epigastric tenderness MSK/SPINE:  No gross deformities, no edema SKIN:  no rash, atraumatic   *Additional and/or pertinent findings included in MDM below  Diagnostic and Interventional Summary    EKG Interpretation  Date/Time:  Wednesday June 21 2022 05:33:47 EDT Ventricular Rate:  81 PR Interval:  177 QRS Duration: 93 QT Interval:  363 QTC Calculation: 419 R Axis:   -1 Text Interpretation: Sinus rhythm Minimal ST depression, inferior leads Confirmed by Kennis Carina [phone]) on 06/21/2022 6:55:27 AM       Labs Reviewed  CBC - Abnormal; Notable for the following components:      Result Value   WBC 10.6 (*)    All other components within normal limits  COMPREHENSIVE METABOLIC PANEL - Abnormal; Notable for the following components:   Sodium 131 (*)    Potassium 3.4 (*)    Chloride 95 (*)    Glucose, Bld 122 (*)    Total Protein 8.5 (*)    All other components within normal limits  LIPASE, BLOOD - Abnormal; Notable for the following components:   Lipase 86 (*)    All other components within normal limits  TROPONIN I (HIGH SENSITIVITY)    DG Abdomen Acute W/Chest  Final Result    CT ABDOMEN PELVIS W CONTRAST    (Results Pending)    Medications  HYDROmorphone (DILAUDID) injection 1 mg (1 mg Intravenous Given 06/21/22 0553)  ondansetron (ZOFRAN) injection 4 mg (4 mg Intravenous Given 06/21/22 0553)  lactated  ringers bolus 1,000 mL (1,000 mLs Intravenous New Bag/Given 06/21/22 0552)     Procedures  /  Critical Care Procedures  ED Course and Medical Decision Making  Initial Impression and Ddx Suspect continued pain due to pancreatitis based on recent CT imaging and labs.  May need admission for pain control.  Past medical/surgical history that increases complexity of ED encounter: History of pancreatitis  Interpretation of Diagnostics I personally reviewed the EKG and my interpretation is as follows: Sinus rhythm  Labs with  no significant blood count or electrolyte disturbance, leukocytosis is mild, lipase minimally uptrending  Patient Reassessment and Ultimate Disposition/Management Clinical Course as of 06/21/22 0706  Wed Jun 21, 2022  4098 Pancreatitis. Was recently here with same symptoms. Lipase was 60 had CT yesterday that showed pancreatitis. Now has back pain. Did get an x-ray bc he has a dilated abdomen. X-ray shows dilated loops. May need to ct. Likely will admit.  [RP]    Clinical Course User Index [RP] Rondel Baton, MD       Patient management required discussion with the following services or consulting groups:  None  Complexity of Problems Addressed Acute illness or injury that poses threat of life of bodily function  Additional Data Reviewed and Analyzed Further history obtained from: Prior labs/imaging results  Additional Factors Impacting ED Encounter Risk Consideration of hospitalization  Elmer Sow. Pilar Plate, MD [facility] Emergency Medicine [facility] [email or web address]  Final Clinical Impressions(s) / ED Diagnoses     ICD-10-CM   1. Acute pancreatitis, unspecified complication status, unspecified pancreatitis type  K85.90       ED Discharge Orders     None        Discharge Instructions Discussed with and Provided to Patient:   Discharge Instructions   None      Sabas Sous, MD 06/21/22 [phone]

## 2022-06-21 NOTE — Progress Notes (Signed)
Patient blood pressure reading is 150/104. Prn medication given.

## 2022-06-21 NOTE — ED Notes (Signed)
ED Provider at bedside. 

## 2022-06-22 DIAGNOSIS — K859 Acute pancreatitis without necrosis or infection, unspecified: Principal | ICD-10-CM

## 2022-06-22 LAB — COMPREHENSIVE METABOLIC PANEL
ALT: 28 U/L (ref 0–44)
AST: 27 U/L (ref 15–41)
Albumin: 3.4 g/dL — ABNORMAL LOW (ref 3.5–5.0)
Alkaline Phosphatase: 99 U/L (ref 38–126)
Anion gap: 7 (ref 5–15)
BUN: 7 mg/dL (ref 6–20)
CO2: 26 mmol/L (ref 22–32)
Calcium: 8.1 mg/dL — ABNORMAL LOW (ref 8.9–10.3)
Chloride: 97 mmol/L — ABNORMAL LOW (ref 98–111)
Creatinine, Ser: 0.87 mg/dL (ref 0.61–1.24)
GFR, Estimated: >60 mL/min (ref >60)
Glucose, Bld: 113 mg/dL — ABNORMAL HIGH (ref 70–99)
Potassium: 3.5 mmol/L (ref 3.5–5.1)
Sodium: 130 mmol/L — ABNORMAL LOW (ref 135–145)
Total Bilirubin: 1 mg/dL (ref 0.3–1.2)
Total Protein: 7.2 g/dL (ref 6.5–8.1)

## 2022-06-22 LAB — CBC
HCT: 42.4 % (ref 39.0–52.0)
Hemoglobin: 14.3 g/dL (ref 13.0–17.0)
MCH: 31.8 pg (ref 26.0–34.0)
MCHC: 33.7 g/dL (ref 30.0–36.0)
MCV: 94.4 fL (ref 80.0–100.0)
Platelets: 181 10*3/uL (ref 150–400)
RBC: 4.49 MIL/uL (ref 4.22–5.81)
RDW: 15.2 % (ref 11.5–15.5)
WBC: 8.8 10*3/uL (ref 4.0–10.5)
nRBC: 0 % (ref 0.0–0.2)

## 2022-06-22 MED ORDER — POTASSIUM CHLORIDE 10 MEQ/100ML IV SOLN
10.0000 meq | INTRAVENOUS | Status: DC
  Filled 2022-06-22: qty 100

## 2022-06-22 MED ORDER — AMLODIPINE BESYLATE 5 MG PO TABS: 10.0000 mg | ORAL_TABLET | Freq: Every day | ORAL | Status: DC

## 2022-06-22 NOTE — Progress Notes (Signed)
Patient requested to have something for gas and to help with having a bowel movement. MD on call notified.

## 2022-06-22 NOTE — Progress Notes (Signed)
Patient states he is leaving and self removed PIV x2 after refusing medications. Notified MD

## 2022-06-22 NOTE — Discharge Summary (Signed)
Physician Discharge Summary   Patient: Fernando [name] MRN: [id number] DOB: 25-Oct-1960  Admit date:     06/21/2022  Discharge date: 06/22/22  Discharge Physician: Alba Cory   PCP: Shawnie Dapper, PA-C   Recommendations at discharge:   Left AMA.  Discharge Diagnoses: Principal Problem:   Pancreatitis Active Problems:   Anal fistula   Hypokalemia   Essential hypertension   COPD (chronic obstructive pulmonary disease) (HCC)  Resolved Problems:   * No resolved hospital problems. *  Hospital Course: 62 year old with past medical history significant for mild chronic hypoxic respiratory failure, annual fistula, hypothyroidism, hypertension, he has recently been taking off of blood pressure medication, history of alcohol use presented with abdominal pain, epigastric and lower quadrant, 10 out of 10 in intensity, associated with nausea.  He was found to have acute pancreatitis with mild elevated of lipase at 86, CT abdomen and pelvis show some mild peripancreatic inflammatory edematous changes.    Assessment and Plan: 1-Acute Pancreatitis -Patient presented with abdominal pain, nausea, lipase 86.  CT abdomen and pelvis consistent with mild pancreatitis, no gallstone. Denies recent alcohol use.  -Treated with IV fluids -IV Dilaudid, PRN Zofran.   Patient left AMA, he didn't wait for morning rounds.   2-Hypokalemia; Replaced.  3-Hypomagnesemia; Replaced.  4-HTN; His PCP discontinue his medications, because his BP was normal.  He has been started on Norvasc.  5-Hyponatremia; IV fluids.           Consultants: None Procedures performed: none Disposition:  Left AMA Diet recommendation:  Left AMA DISCHARGE MEDICATION: Allergies as of 06/22/2022       Reactions   Morphine And Codeine Other (See Comments)   hallucinations        Medication List     ASK your doctor about these medications    ALPRAZolam 1 MG tablet Commonly known as: XANAX Take 1 mg by mouth in  the morning, at noon, and at bedtime.   amLODipine 5 MG tablet Commonly known as: NORVASC Take 1 tablet (5 mg total) by mouth daily.   oxyCODONE-acetaminophen 5-325 MG tablet Commonly known as: PERCOCET/ROXICET Take 1 tablet by mouth every 6 (six) hours as needed for severe pain.        Discharge Exam: Ceasar Mons Weights   06/21/22 0532  Weight: 117.5 kg   Left AMA    The results of significant diagnostics from this hospitalization (including imaging, microbiology, ancillary and laboratory) are listed below for reference.   Imaging Studies: CT ABDOMEN PELVIS W CONTRAST  Result Date: 06/21/2022 CLINICAL DATA:  Bowel obstruction suspected Pancreatitis, acute, severe EXAM: CT ABDOMEN AND PELVIS WITH CONTRAST TECHNIQUE: Multidetector CT imaging of the abdomen and pelvis was performed using the standard protocol following bolus administration of intravenous contrast. RADIATION DOSE REDUCTION: This exam was performed according to the departmental dose-optimization program which includes automated exposure control, adjustment of the mA and/or kV according to patient size and/or use of iterative reconstruction technique. CONTRAST:  OMNIPAQUE IOHEXOL 300 MG/ML  SOLN COMPARISON:  06/20/2022 FINDINGS: Lower chest: No pleural or pericardial effusion. Hepatobiliary: No focal liver abnormality is seen. No gallstones, gallbladder wall thickening, or biliary dilatation. Pancreas: Slight increase in mild peripancreatic inflammatory/edematous changes. No pseudocyst. No focal lesion or ductal dilatation. Spleen: Normal in size without focal abnormality. Adrenals/Urinary Tract: No adrenal mass. Bilateral nephrolithiasis, largest stone less than 3 mm. No hydronephrosis. Symmetric renal parenchymal enhancement without focal lesion. Residual contrast material in the partially distended urinary bladder. Stomach/Bowel: Stomach is  incompletely distended, without acute finding. Small bowel decompressed. Post  appendectomy. Gas and fecal material partially distend the colon, without acute finding. Vascular/Lymphatic: No significant vascular findings are present. No enlarged abdominal or pelvic lymph nodes. Reproductive: Prostate enlargement Other: Bilateral pelvic phleboliths.  No ascites.  No free air. Musculoskeletal: Vertebral endplate spurring at multiple levels in the lower thoracic spine. Bilateral L5 pars defects allowing mild grade 1 anterolisthesis L5-S1. No fracture or worrisome bone lesion. IMPRESSION: 1. Slight increase in mild peripancreatic inflammatory/edematous changes. No pseudocyst. 2. Bilateral nephrolithiasis. 3. Prostate enlargement. Electronically Signed   By: Corlis Leak M.D.   On: 06/21/2022 07:38   DG Abdomen Acute W/Chest  Result Date: 06/21/2022 CLINICAL DATA:  62 year old male with history of severe abdominal pain and bloating. EXAM: DG ABDOMEN ACUTE WITH 1 VIEW CHEST COMPARISON:  CT of the abdomen and pelvis 06/20/2022. FINDINGS: Lung volumes are low. Elevation of the left hemidiaphragm. No consolidative airspace disease. No pleural effusions. No pneumothorax. No pulmonary nodule or mass noted. Pulmonary vasculature and the cardiomediastinal silhouette are within normal limits. Orthopedic fixation hardware in the lower cervical spine incidentally noted. Markedly distended loops of bowel in the central and upper abdomen, which appear to be colonic, measuring up to 11.2 cm in diameter. There also appear to be dilated loops of small bowel in the central abdomen, and some air-fluid levels, with maximal small bowel diameter of 3.5 cm. No pneumoperitoneum. Some distal colonic and rectal gas noted. IMPRESSION: 1. Abnormal bowel-gas pattern, favored to reflect ileus, as above. 2. Low lung volumes without radiographic evidence of acute cardiopulmonary disease. Electronically Signed   By: Trudie Reed M.D.   On: 06/21/2022 06:59   CT ABDOMEN PELVIS W CONTRAST  Result Date: 06/20/2022 CLINICAL  DATA:  Generalized abdominal pain and weakness. EXAM: CT ABDOMEN AND PELVIS WITH CONTRAST TECHNIQUE: Multidetector CT imaging of the abdomen and pelvis was performed using the standard protocol following bolus administration of intravenous contrast. RADIATION DOSE REDUCTION: This exam was performed according to the departmental dose-optimization program which includes automated exposure control, adjustment of the mA and/or kV according to patient size and/or use of iterative reconstruction technique. CONTRAST:  OMNIPAQUE IOHEXOL 300 MG/ML  SOLN COMPARISON:  March 12, 2020 FINDINGS: Lower chest: No acute abnormality. Hepatobiliary: No focal liver abnormality is seen. No gallstones, gallbladder wall thickening, or biliary dilatation. Pancreas: Mild peripancreatic inflammatory fat stranding is seen within the region of the pancreatic head. There is no evidence of pancreatic ductal dilatation. Spleen: Normal in size without focal abnormality. Adrenals/Urinary Tract: Adrenal glands are unremarkable. Kidneys are normal in size, without renal calculi or hydronephrosis. A 9 mm diameter cyst is seen within the anterior aspect of the mid right kidney. Bladder is unremarkable. Stomach/Bowel: Stomach is within normal limits. The appendix is surgically absent. No evidence of bowel wall thickening, distention, or inflammatory changes. Noninflamed diverticula are seen within the proximal sigmoid colon. Vascular/Lymphatic: No significant vascular findings are present. No enlarged abdominal or pelvic lymph nodes. Reproductive: There is moderate to marked severity prostate gland enlargement. Other: No abdominal wall hernia or abnormality. No abdominopelvic ascites. Musculoskeletal: There is grade 1 anterolisthesis of the L5 vertebral body on S1 with multilevel degenerative changes noted throughout the lumbar spine. IMPRESSION: 1. Findings consistent with mild acute pancreatitis. Correlation with pancreatic enzymes is  recommended. 2. Sigmoid diverticulosis. 3. Moderate to marked severity prostatomegaly. Correlation with PSA levels is recommended. 4. Grade 1 anterolisthesis of the L5 vertebral body on S1 with multilevel degenerative  changes throughout the lumbar spine. Electronically Signed   By: Aram Candela M.D.   On: 06/20/2022 17:16    Microbiology: Results for orders placed or performed during the hospital encounter of 12/22/21  Resp panel by RT-PCR (RSV, Flu A&B, Covid) Anterior Nasal Swab     Status: Abnormal   Collection Time: 12/22/21  1:27 AM   Specimen: Anterior Nasal Swab  Result Value Ref Range Status   SARS Coronavirus 2 by RT PCR NEGATIVE NEGATIVE Final    Comment: (NOTE) SARS-CoV-2 target nucleic acids are NOT DETECTED.  The SARS-CoV-2 RNA is generally detectable in upper respiratory specimens during the acute phase of infection. The lowest concentration of SARS-CoV-2 viral copies this assay can detect is 138 copies/mL. A negative result does not preclude SARS-Cov-2 infection and should not be used as the sole basis for treatment or other patient management decisions. A negative result may occur with  improper specimen collection/handling, submission of specimen other than nasopharyngeal swab, presence of viral mutation(s) within the areas targeted by this assay, and inadequate number of viral copies(<138 copies/mL). A negative result must be combined with clinical observations, patient history, and epidemiological information. The expected result is Negative.  Fact Sheet for Patients:  BloggerCourse.com  Fact Sheet for Healthcare Providers:  SeriousBroker.it  This test is no t yet approved or cleared by the [place] FDA and  has been authorized for detection and/or diagnosis of SARS-CoV-2 by FDA under an Emergency Use Authorization (EUA). This EUA will remain  in effect (meaning this test can be used) for the duration of  the COVID-19 declaration under Section 564(b)(1) of the Act, 21 U.S.C.section 360bbb-3(b)(1), unless the authorization is terminated  or revoked sooner.       Influenza A by PCR POSITIVE (A) NEGATIVE Final   Influenza B by PCR NEGATIVE NEGATIVE Final    Comment: (NOTE) The Xpert Xpress SARS-CoV-2/FLU/RSV plus assay is intended as an aid in the diagnosis of influenza from Nasopharyngeal swab specimens and should not be used as a sole basis for treatment. Nasal washings and aspirates are unacceptable for Xpert Xpress SARS-CoV-2/FLU/RSV testing.  Fact Sheet for Patients: BloggerCourse.com  Fact Sheet for Healthcare Providers: SeriousBroker.it  This test is not yet approved or cleared by the [place] FDA and has been authorized for detection and/or diagnosis of SARS-CoV-2 by FDA under an Emergency Use Authorization (EUA). This EUA will remain in effect (meaning this test can be used) for the duration of the COVID-19 declaration under Section 564(b)(1) of the Act, 21 U.S.C. section 360bbb-3(b)(1), unless the authorization is terminated or revoked.     Resp Syncytial Virus by PCR NEGATIVE NEGATIVE Final    Comment: (NOTE) Fact Sheet for Patients: BloggerCourse.com  Fact Sheet for Healthcare Providers: SeriousBroker.it  This test is not yet approved or cleared by the [place] FDA and has been authorized for detection and/or diagnosis of SARS-CoV-2 by FDA under an Emergency Use Authorization (EUA). This EUA will remain in effect (meaning this test can be used) for the duration of the COVID-19 declaration under Section 564(b)(1) of the Act, 21 U.S.C. section 360bbb-3(b)(1), unless the authorization is terminated or revoked.  Performed at [facility], [address] [place], [place] [address]     Labs: CBC: Recent Labs  Lab 06/20/22 1309 06/21/22 0548  06/22/22 0433  WBC 10.1 10.6* 8.8  HGB 15.7 15.7 14.3  HCT 45.7 46.2 42.4  MCV 93.6 93.7 94.4  PLT 208 200 181   Basic Metabolic Panel:  Recent Labs  Lab 06/20/22 1309 06/21/22 0548 06/21/22 0756 06/22/22 0433  NA 132* 131*  --  130*  K 3.6 3.4*  --  3.5  CL 99 95*  --  97*  CO2 25 26  --  26  GLUCOSE 104* 122*  --  113*  BUN 12 12  --  7  CREATININE 1.14 1.19  --  0.87  CALCIUM 8.4* 8.9  --  8.1*  MG  --   --  1.7  --    Liver Function Tests: Recent Labs  Lab 06/20/22 1309 06/21/22 0548 06/22/22 0433  AST 37 32 27  ALT 39 37 28  ALKPHOS 116 125 99  BILITOT 0.8 0.6 1.0  PROT 7.8 8.5* 7.2  ALBUMIN 3.8 4.0 3.4*   CBG: No results for input(s): "GLUCAP" in the last 168 hours.  Discharge time spent: less than 30 minutes.  Signed: Alba Cory, MD [facility] 06/22/2022

## 2022-06-22 NOTE — TOC CM/SW Note (Signed)
Transition of Care [facility]) - Inpatient Brief Assessment   Patient Details  Name: Fernando [name] MRN: [id number] Date of Birth: 25-Oct-1960  Transition of [facility]) CM/SW Contact:    Karn Cassis, LCSW Phone Number: 06/22/2022, 8:13 AM   Clinical Narrative: Pt admitted due to acute pancreatitis. He reports he lives with his parents and is independent with ADLs. No needs reported. TOC will follow.     Transition of Care Asessment: Insurance and Status: Insurance coverage has been reviewed (Pt said has Medicaid. Has talked to registration) Patient has primary care physician: Yes Home environment has been reviewed: lives with parents Prior level of function:: independent Prior/Current Home Services: No current home services Social Determinants of Health Reivew: SDOH reviewed no interventions necessary Readmission risk has been reviewed: Yes Transition of care needs: no transition of care needs at this time

## 2022-06-22 NOTE — Progress Notes (Signed)
Patient slept some during the night. Patients blood pressure continues to be elevated even after interventions. Patient stated, " I am under a lot of stress right now at home and not working because I have to have knee surgery". Patient has been given prn medications 4 times during this shift. Patient up to the bathroom independently several times during the night.

## 2022-07-21 ENCOUNTER — Emergency Department (HOSPITAL_COMMUNITY): Payer: Medicaid Other

## 2022-07-21 ENCOUNTER — Inpatient Hospital Stay (HOSPITAL_COMMUNITY)
Admission: RE | Admit: 2022-07-21 | Discharge: 2022-07-21 | Disposition: A | Discharge status: Home / Self Care | Payer: Self-pay | Source: Ambulatory Visit

## 2022-07-21 ENCOUNTER — Encounter (HOSPITAL_COMMUNITY): Payer: Self-pay | Admitting: Emergency Medicine

## 2022-07-21 ENCOUNTER — Other Ambulatory Visit: Payer: Self-pay

## 2022-07-21 ENCOUNTER — Emergency Department (HOSPITAL_COMMUNITY)
Admission: EM | Admit: 2022-07-21 | Discharge: 2022-07-21 | Disposition: A | Discharge status: Home / Self Care | Payer: Medicaid Other | Attending: Emergency Medicine | Admitting: Emergency Medicine

## 2022-07-21 ENCOUNTER — Telehealth: Payer: Self-pay | Admitting: Gastroenterology

## 2022-07-21 ENCOUNTER — Encounter (HOSPITAL_COMMUNITY): Payer: Self-pay

## 2022-07-21 DIAGNOSIS — I10 Essential (primary) hypertension: Secondary | ICD-10-CM | POA: Insufficient documentation

## 2022-07-21 DIAGNOSIS — R059 Cough, unspecified: Secondary | ICD-10-CM | POA: Insufficient documentation

## 2022-07-21 DIAGNOSIS — Z79899 Other long term (current) drug therapy: Secondary | ICD-10-CM | POA: Diagnosis not present

## 2022-07-21 DIAGNOSIS — E039 Hypothyroidism, unspecified: Secondary | ICD-10-CM | POA: Insufficient documentation

## 2022-07-21 DIAGNOSIS — J449 Chronic obstructive pulmonary disease, unspecified: Secondary | ICD-10-CM | POA: Diagnosis not present

## 2022-07-21 DIAGNOSIS — R1314 Dysphagia, pharyngoesophageal phase: Secondary | ICD-10-CM | POA: Diagnosis not present

## 2022-07-21 DIAGNOSIS — Z20822 Contact with and (suspected) exposure to covid-19: Secondary | ICD-10-CM | POA: Diagnosis not present

## 2022-07-21 DIAGNOSIS — R09A2 Foreign body sensation, throat: Secondary | ICD-10-CM | POA: Diagnosis present

## 2022-07-21 LAB — BASIC METABOLIC PANEL
Anion gap: 10 (ref 5–15)
BUN: 14 mg/dL (ref 6–20)
CO2: 23 mmol/L (ref 22–32)
Calcium: 9 mg/dL (ref 8.9–10.3)
Chloride: 101 mmol/L (ref 98–111)
Creatinine, Ser: 1.06 mg/dL (ref 0.61–1.24)
GFR, Estimated: >60 mL/min (ref >60)
Glucose, Bld: 114 mg/dL — ABNORMAL HIGH (ref 70–99)
Potassium: 3.6 mmol/L (ref 3.5–5.1)
Sodium: 134 mmol/L — ABNORMAL LOW (ref 135–145)

## 2022-07-21 LAB — RESP PANEL BY RT-PCR (RSV, FLU A&B, COVID)  RVPGX2
Influenza A by PCR: NEGATIVE
Influenza B by PCR: NEGATIVE
Resp Syncytial Virus by PCR: NEGATIVE
SARS Coronavirus 2 by RT PCR: NEGATIVE

## 2022-07-21 LAB — CBC
HCT: 43.9 % (ref 39.0–52.0)
Hemoglobin: 15.1 g/dL (ref 13.0–17.0)
MCH: 32.3 pg (ref 26.0–34.0)
MCHC: 34.4 g/dL (ref 30.0–36.0)
MCV: 93.8 fL (ref 80.0–100.0)
Platelets: 232 10*3/uL (ref 150–400)
RBC: 4.68 MIL/uL (ref 4.22–5.81)
RDW: 15 % (ref 11.5–15.5)
WBC: 9 10*3/uL (ref 4.0–10.5)
nRBC: 0 % (ref 0.0–0.2)

## 2022-07-21 MED ORDER — GLUCAGON HCL RDNA (DIAGNOSTIC) 1 MG IJ SOLR
1.0000 mg | Freq: Once | INTRAMUSCULAR | Status: AC
  Administered 2022-07-21: 1 mg via INTRAVENOUS
  Filled 2022-07-21: qty 1

## 2022-07-21 MED ORDER — PANTOPRAZOLE SODIUM 20 MG PO TBEC: 20.0000 mg | DELAYED_RELEASE_TABLET | Freq: Two times a day (BID) | ORAL | 0 refills | Status: DC

## 2022-07-21 MED ORDER — ONDANSETRON HCL 4 MG/2ML IJ SOLN
4.0000 mg | Freq: Once | INTRAMUSCULAR | Status: AC
  Administered 2022-07-21: 4 mg via INTRAVENOUS
  Filled 2022-07-21: qty 2

## 2022-07-21 NOTE — Telephone Encounter (Signed)
Pt aware his pre-op will be done via telephone call today. Also discussed EGD instructions in detail with pt. He voiced understanding.

## 2022-07-21 NOTE — Discharge Instructions (Addendum)
You were seen for your difficulty swallowing in the emergency department.   At home, please start the Protonix twice daily in case you have esophageal irritation from reflux.  Start eating a soft food eating plan until you feel like you are able to swallow normally.    Check your MyChart online for the results of any tests that had not resulted by the time you left the emergency department.   Follow-up with gastroenterology in 1 week for your symptoms.  Return immediately to the emergency department if you experience any of the following: Inability to swallow your saliva, shortness of breath, or any other concerning symptoms.    Thank you for visiting our Emergency Department. It was a pleasure taking care of you today.

## 2022-07-21 NOTE — Telephone Encounter (Signed)
Patient presented to the ED having significant dysphagia.  Per Dr. Marletta Lor -  Not acutely obstructed, tolerating liquids. Need urgent evaluation with EGD outpatient. Based on review of meds no need to hold. Per medical history needs to be ASA 3  [name], MSN, APRN, FNP-BC, AGACNP-BC [facility] at [facility]

## 2022-07-21 NOTE — Telephone Encounter (Signed)
Spoke with pt and he has been added on for Monday 7/15. Aware will call back regarding pre-op appt as he is still currently in the ED.

## 2022-07-21 NOTE — ED Triage Notes (Signed)
Pt reports he ate roast beef yesterday. At 2am pt reports he woke up and was coughing and felt like something was stuck in his throat. Pt reports he as able to take his medication this morning but felt that it was getting stuck.

## 2022-07-21 NOTE — ED Provider Notes (Signed)
[place] EMERGENCY DEPARTMENT AT [facility] Provider Note   CSN: [id number] Arrival date & time: 07/21/22  0751     History  Chief Complaint  Patient presents with   Food Bolus    Fernando [name] is a 62 y.o. male.  62 year old male with a history of alcohol abuse, COPD, hypertension, hypothyroidism, and left lower lobe lobectomy who presents emergency department with foreign body sensation in his throat.  Says last night at 2 AM he woke up and had a foreign body sensation in his throat.  Has been trying sips of Coke but feels like it will not wash down.  Took his pills this morning and was able to hold them down but feels like one of them is caught in the back of his throat.  Also has had a cough productive of yellow sputum recently.  No fevers or shortness of breath.  Thinks he may have some voice changes.  Does not have any sore throat at all and says that he feels like he does not have strep throat since he has had that in the past and this is something different.  Denies any dysphagia recently otherwise.  No double vision, blurry vision, difficulty walking or coordination issues.       Home Medications Prior to Admission medications   Medication Sig Start Date End Date Taking? Authorizing Provider  pantoprazole (PROTONIX) 20 MG tablet Take 1 tablet (20 mg total) by mouth 2 (two) times daily. 07/21/22 08/20/22 Yes Rondel Baton, MD  ALPRAZolam Prudy Feeler) 1 MG tablet Take 1 mg by mouth in the morning, at noon, and at bedtime.    [provider]  amLODipine (NORVASC) 5 MG tablet Take 1 tablet (5 mg total) by mouth daily. 06/20/22   Roemhildt, Lorin T, PA-C  oxyCODONE-acetaminophen (PERCOCET/ROXICET) 5-325 MG tablet Take 1 tablet by mouth every 6 (six) hours as needed for severe pain. 06/20/22   Roemhildt, Lorin T, PA-C      Allergies    Morphine and codeine    Review of Systems   Review of Systems  Physical Exam Updated Vital Signs BP (!) 155/103 (BP  Location: Right Arm)   Pulse 82   Temp 98 F (36.7 C) (Oral)   Resp 15   SpO2 97%  Physical Exam Vitals and nursing note reviewed.  Constitutional:      General: He is not in acute distress.    Appearance: He is well-developed.     Comments: Tolerating his secretions.  HENT:     Head: Normocephalic and atraumatic.     Right Ear: External ear normal.     Left Ear: External ear normal.     Nose: Nose normal.     Mouth/Throat:     Mouth: Mucous membranes are moist.     Pharynx: Oropharynx is clear. No oropharyngeal exudate or posterior oropharyngeal erythema.     Comments: Uvula midline.  Does have bilateral tonsillar hypertrophy but no erythema or exudates. Eyes:     Extraocular Movements: Extraocular movements intact.     Conjunctiva/sclera: Conjunctivae normal.     Pupils: Pupils are equal, round, and reactive to light.  Cardiovascular:     Rate and Rhythm: Normal rate and regular rhythm.     Heart sounds: Normal heart sounds.  Pulmonary:     Effort: Pulmonary effort is normal. No respiratory distress.     Breath sounds: Normal breath sounds. No stridor.  Musculoskeletal:     Cervical back: Normal range  of motion and neck supple. No rigidity or tenderness.  Skin:    General: Skin is warm and dry.  Neurological:     Mental Status: He is alert. Mental status is at baseline.  Psychiatric:        Mood and Affect: Mood normal.        Behavior: Behavior normal.     ED Results / Procedures / Treatments   Labs (all labs ordered are listed, but only abnormal results are displayed) Labs Reviewed  BASIC METABOLIC PANEL - Abnormal; Notable for the following components:      Result Value   Sodium 134 (*)    Glucose, Bld 114 (*)    All other components within normal limits  RESP PANEL BY RT-PCR (RSV, FLU A&B, COVID)  [id number]  CBC    EKG None  Radiology DG Neck Soft Tissue  Result Date: 07/21/2022 CLINICAL DATA:  Cough, foreign body sensation and neck. EXAM: NECK SOFT  TISSUES - 1+ VIEW COMPARISON:  None Available. FINDINGS: There is no evidence of retropharyngeal soft tissue swelling or epiglottic enlargement. The cervical airway is unremarkable and no radio-opaque foreign body identified. IMPRESSION: Negative. Electronically Signed   By: Lupita Raider M.D.   On: 07/21/2022 09:21    Procedures Procedures    Medications Ordered in ED Medications  glucagon (human recombinant) (GLUCAGEN) injection 1 mg (1 mg Intravenous Given 07/21/22 0844)  ondansetron (ZOFRAN) injection 4 mg (4 mg Intravenous Given 07/21/22 0845)    ED Course/ Medical Decision Making/ A&P Clinical Course as of 07/21/22 1004  Fri Jul 21, 2022  1610 Dr Marletta Lor from GI consulted and recommends soft diet. BID PPI and next week fu for EGD.  [RP]    Clinical Course User Index [RP] Rondel Baton, MD                             Medical Decision Making Amount and/or Complexity of Data Reviewed Labs: ordered. Radiology: ordered.  Risk Prescription drug management.   Fernando [name] is a 62 y.o. male with comorbidities that complicate the patient evaluation including alcohol abuse, COPD, hypertension, hypothyroidism, and left lower lobe lobectomy who presents emergency department with foreign body sensation in his throat.    Initial Ddx:  Dysphagia, Zenker's diverticulum, stroke, food impaction, infection  MDM/Course:  Concerned that patient may have dysphagia or food impaction based on his symptoms.  Peers to be tolerating secretions right now and does not have any concerns for airway compromise.  This may be from an abnormality such as a Zenker's diverticulum or even potentially mass.  No signs of infection on his exam such as a PTA will be present and does not any infectious symptoms.  Was given glucagon and Zofran for symptoms.  Upon re-evaluation still having a foreign body sensation but is able to swallow liquids here in the emergency department.  He did have x-rays of his chest  and soft tissue neck which did not show any abnormalities.  Was discussed with GI who wants him to start on a PPI twice daily and follow-up next week and to start a soft diet.  Patient given a prescription for Protonix.  Discussed return precautions prior to discharge.  This patient presents to the ED for concern of complaints listed in HPI, this involves an extensive number of treatment options, and is a complaint that carries with it a high risk of complications and morbidity. Disposition including  potential need for admission considered.   Dispo: DC Home. Return precautions discussed including, but not limited to, those listed in the AVS. Allowed pt time to ask questions which were answered fully prior to dc.  Additional history obtained from family Records reviewed Outpatient Clinic Notes I independently reviewed the following imaging with scope of interpretation limited to determining acute life threatening conditions related to emergency care: Chest x-ray and agree with the radiologist interpretation with the following exceptions: none I personally reviewed and interpreted cardiac monitoring: normal sinus rhythm  I have reviewed the patients home medications and made adjustments as needed Consults: Gastroenterology       Final Clinical Impression(s) / ED Diagnoses Final diagnoses:  Pharyngoesophageal dysphagia    Rx / DC Orders ED Discharge Orders          Ordered    pantoprazole (PROTONIX) 20 MG tablet  2 times daily        07/21/22 0959              Rondel Baton, MD 07/21/22 1004

## 2022-07-24 ENCOUNTER — Telehealth: Payer: Self-pay | Admitting: Internal Medicine

## 2022-07-24 ENCOUNTER — Encounter (HOSPITAL_COMMUNITY): Payer: Self-pay

## 2022-07-24 ENCOUNTER — Encounter (HOSPITAL_COMMUNITY)
Admission: RE | Disposition: A | Discharge status: Home / Self Care | Payer: Self-pay | Source: Home / Self Care | Attending: Internal Medicine

## 2022-07-24 ENCOUNTER — Ambulatory Visit (HOSPITAL_COMMUNITY): Payer: Medicaid Other | Admitting: Anesthesiology

## 2022-07-24 ENCOUNTER — Ambulatory Visit (HOSPITAL_COMMUNITY)
Admission: RE | Admit: 2022-07-24 | Discharge: 2022-07-24 | Disposition: A | Discharge status: Home / Self Care | Payer: Medicaid Other | Attending: Internal Medicine | Admitting: Internal Medicine

## 2022-07-24 DIAGNOSIS — K2950 Unspecified chronic gastritis without bleeding: Secondary | ICD-10-CM | POA: Insufficient documentation

## 2022-07-24 DIAGNOSIS — K2970 Gastritis, unspecified, without bleeding: Secondary | ICD-10-CM | POA: Diagnosis not present

## 2022-07-24 DIAGNOSIS — Z87891 Personal history of nicotine dependence: Secondary | ICD-10-CM | POA: Insufficient documentation

## 2022-07-24 DIAGNOSIS — G4733 Obstructive sleep apnea (adult) (pediatric): Secondary | ICD-10-CM | POA: Diagnosis not present

## 2022-07-24 DIAGNOSIS — J449 Chronic obstructive pulmonary disease, unspecified: Secondary | ICD-10-CM | POA: Insufficient documentation

## 2022-07-24 DIAGNOSIS — M1990 Unspecified osteoarthritis, unspecified site: Secondary | ICD-10-CM

## 2022-07-24 DIAGNOSIS — Z85528 Personal history of other malignant neoplasm of kidney: Secondary | ICD-10-CM | POA: Insufficient documentation

## 2022-07-24 DIAGNOSIS — I10 Essential (primary) hypertension: Secondary | ICD-10-CM | POA: Insufficient documentation

## 2022-07-24 DIAGNOSIS — I712 Thoracic aortic aneurysm, without rupture, unspecified: Secondary | ICD-10-CM | POA: Insufficient documentation

## 2022-07-24 DIAGNOSIS — R1310 Dysphagia, unspecified: Secondary | ICD-10-CM | POA: Diagnosis not present

## 2022-07-24 DIAGNOSIS — B9681 Helicobacter pylori [H. pylori] as the cause of diseases classified elsewhere: Secondary | ICD-10-CM | POA: Insufficient documentation

## 2022-07-24 DIAGNOSIS — M25552 Pain in left hip: Secondary | ICD-10-CM

## 2022-07-24 DIAGNOSIS — G8929 Other chronic pain: Secondary | ICD-10-CM

## 2022-07-24 HISTORY — PX: ESOPHAGOGASTRODUODENOSCOPY (EGD) WITH PROPOFOL: SHX5813

## 2022-07-24 HISTORY — PX: BIOPSY: SHX5522

## 2022-07-24 SURGERY — ESOPHAGOGASTRODUODENOSCOPY (EGD) WITH PROPOFOL
Anesthesia: General

## 2022-07-24 MED ORDER — LIDOCAINE VISCOUS HCL 2 % MT SOLN
15.0000 mL | Freq: Once | OROMUCOSAL | Status: AC
  Administered 2022-07-24: 15 mL via OROMUCOSAL

## 2022-07-24 MED ORDER — PANTOPRAZOLE SODIUM 40 MG PO TBEC: 40.0000 mg | DELAYED_RELEASE_TABLET | Freq: Two times a day (BID) | ORAL | 11 refills | Status: AC

## 2022-07-24 MED ORDER — GLYCOPYRROLATE PF 0.2 MG/ML IJ SOSY
PREFILLED_SYRINGE | INTRAMUSCULAR | Status: AC
  Filled 2022-07-24: qty 1

## 2022-07-24 MED ORDER — PROPOFOL 10 MG/ML IV BOLUS
INTRAVENOUS | Status: DC | PRN
Start: 2022-07-24 — End: 2022-07-24
  Administered 2022-07-24: 30 mg via INTRAVENOUS
  Administered 2022-07-24: 20 mg via INTRAVENOUS
  Administered 2022-07-24: 80 mg via INTRAVENOUS
  Administered 2022-07-24: 20 mg via INTRAVENOUS

## 2022-07-24 MED ORDER — GLYCOPYRROLATE PF 0.2 MG/ML IJ SOSY
PREFILLED_SYRINGE | INTRAMUSCULAR | Status: DC | PRN
  Administered 2022-07-24: .2 mg via INTRAVENOUS

## 2022-07-24 MED ORDER — LIDOCAINE HCL (CARDIAC) PF 100 MG/5ML IV SOSY
PREFILLED_SYRINGE | INTRAVENOUS | Status: DC | PRN
  Administered 2022-07-24: 100 mg via INTRATRACHEAL

## 2022-07-24 MED ORDER — MIDAZOLAM HCL 2 MG/2ML IJ SOLN
INTRAMUSCULAR | Status: AC
  Filled 2022-07-24: qty 2

## 2022-07-24 MED ORDER — LACTATED RINGERS IV SOLN: INTRAVENOUS | Status: DC

## 2022-07-24 MED ORDER — LIDOCAINE VISCOUS HCL 2 % MT SOLN
OROMUCOSAL | Status: AC
  Filled 2022-07-24: qty 15

## 2022-07-24 MED ORDER — MIDAZOLAM HCL 5 MG/5ML IJ SOLN
INTRAMUSCULAR | Status: DC | PRN
  Administered 2022-07-24: 2 mg via INTRAVENOUS

## 2022-07-24 NOTE — H&P (Signed)
Primary Care Physician:  Samuella Bruin Primary Gastroenterologist:  Dr. Marletta Lor  Pre-Procedure History & Physical: HPI:  Fernando [name] is a 62 y.o. male is here for an EGD with possible dilation to be performed for chronic GERD, dysphagia  Past Medical History:  Diagnosis Date   Acute respiratory failure with hypoxia (HCC) 08/27/2015   Mild-Oxygen saturation 88% with ambulation.   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   Pancreatitis    Peri-rectal abscess    multiple.    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   Tobacco abuse    Umbilical hernia    has been repaired    Past Surgical History:  Procedure Laterality Date   ABSCESS DRAINAGE     APPENDECTOMY     CERVICAL FUSION     COLONOSCOPY     CYSTOSCOPY N/A 05/01/2013   Procedure: CYSTOSCOPY;  Surgeon: Ky Barban, MD;  Location: [facility];  Service: Urology;  Laterality: N/A;   EVALUATION UNDER ANESTHESIA WITH FISTULECTOMY N/A 06/23/2015   Procedure: ANAL EXAM UNDER ANESTHESIA  FISTULOTOMY;  Surgeon: Romie Levee, MD;  Location: [facility];  Service: General;  Laterality: N/A;   FACIAL COSMETIC SURGERY     HERNIA REPAIR     INCISION AND DRAINAGE ABSCESS Right 05/01/2013   Procedure: INCISION AND DRAINAGE RIGHT SCROTAL ABSCESS;  Surgeon: Ky Barban, MD;  Location: [facility];  Service: Urology;  Laterality: Right;   INCISION AND DRAINAGE PERIRECTAL ABSCESS     KNEE ARTHROSCOPY     SPINE SURGERY     cervical   VIDEO ASSISTED THORACOSCOPY (VATS)/ LOBECTOMY Left 05/03/2015   Procedure:  LEFT VIDEO ASSISTED THORACOSCOPY (VATS)/ LEFT LOWER LOBECTOMY, ON-Q CATHETER PLACEMENT;  Surgeon: Loreli Slot, MD;  Location: [facility];  Service: Thoracic;  Laterality: Left;   VIDEO BRONCHOSCOPY WITH ENDOBRONCHIAL ULTRASOUND N/A 04/22/2015   Procedure: VIDEO BRONCHOSCOPY WITH ENDOBRONCHIAL ULTRASOUND;  Surgeon: Loreli Slot, MD;  Location: [facility];  Service: Thoracic;  Laterality: N/A;    Prior to Admission medications   Medication Sig Start Date End Date Taking? Authorizing Provider  ALPRAZolam Prudy Feeler) 1 MG tablet Take 1 mg by mouth in the morning, at noon, and at bedtime.   Yes [provider]  amLODipine (NORVASC) 5 MG tablet Take 1 tablet (5 mg total) by mouth daily. Patient taking differently: Take 5 mg by mouth daily at 6 (six) AM. 06/20/22  Yes Roemhildt, Lorin T, PA-C  enalapril (VASOTEC) 10 MG tablet Take 10 mg by mouth daily at 6 (six) AM.   Yes [provider]  furosemide (LASIX) 20 MG tablet Take 20 mg by mouth daily as needed (fluid retention.).   Yes [provider]  ondansetron (ZOFRAN) 4 MG tablet Take 4 mg by mouth every 8 (eight) hours as needed for nausea or vomiting (with oxycodone-apap).   Yes [provider]  oxyCODONE-acetaminophen (PERCOCET/ROXICET) 5-325 MG tablet Take 1 tablet by mouth every 6 (six)  hours as needed for severe pain. 06/20/22  Yes Roemhildt, Lorin T, PA-C  potassium chloride SA (KLOR-CON M) 20 MEQ tablet Take 20 mEq by mouth daily as needed (cramping).   Yes [provider]  pantoprazole (PROTONIX) 20 MG tablet Take 1 tablet (20 mg total) by mouth 2 (two) times daily. 07/21/22 08/20/22  Rondel Baton, MD    Allergies as of 07/21/2022 - Review Complete 07/21/2022  Allergen Reaction Noted   Morphine and codeine Other (See Comments) 08/21/2010    Family History  Problem Relation Age of Onset   Breast cancer Mother    Thyroid disease Mother     Social History   Socioeconomic History    Marital status: Divorced    Spouse name: Not on file   Number of children: 2   Years of education: 10   Highest education level: Not on file  Occupational History   Occupation: N/A  Tobacco Use   Smoking status: Former    Current packs/day: 0.00    Average packs/day: 0.5 packs/day for 15.0 years (7.5 ttl pk-yrs)    Types: Cigarettes    Start date: 03/04/2000    Quit date: 03/05/2015    Years since quitting: 7.3   Smokeless tobacco: Never  Vaping Use   Vaping status: Never Used  Substance and Sexual Activity   Alcohol use: Yes    Alcohol/week: 0.0 standard drinks of alcohol   Drug use: No   Sexual activity: Never    Birth control/protection: None, Abstinence  Other Topics Concern   Not on file  Social History Narrative   Rare caffeine use    Social Determinants of Health   Financial Resource Strain: Low Risk  (03/24/2022)   Received from [facility], [facility]   Overall Financial Resource Strain (CARDIA)    Difficulty of Paying Living Expenses: Not hard at all  Food Insecurity: No Food Insecurity (06/21/2022)   Hunger Vital Sign    Worried About Running Out of Food in the Last Year: Never true    Ran Out of Food in the Last Year: Never true  Transportation Needs: No Transportation Needs (06/21/2022)   PRAPARE - Administrator, Civil Service (Medical): No    Lack of Transportation (Non-Medical): No  Physical Activity: Not on file  Stress: Not on file  Social Connections: Unknown (06/05/2021)   Received from [facility], [facility]   Social Network    Social Network: Not on file  Intimate Partner Violence: Not At Risk (06/21/2022)   Humiliation, Afraid, Rape, and Kick questionnaire    Fear of Current or Ex-Partner: No    Emotionally Abused: No    Physically Abused: No    Sexually Abused: No    Review of Systems: General: Negative for fever, chills, fatigue, weakness. Eyes: Negative for vision changes.  ENT: Negative for hoarseness, difficulty  swallowing , nasal congestion. CV: Negative for chest pain, angina, palpitations, dyspnea on exertion, peripheral edema.  Respiratory: Negative for dyspnea at rest, dyspnea on exertion, cough, sputum, wheezing.  GI: See history of present illness. GU:  Negative for dysuria, hematuria, urinary incontinence, urinary frequency, nocturnal urination.  MS: Negative for joint pain, low back pain.  Derm: Negative for rash or itching.  Neuro: Negative for weakness, abnormal sensation, seizure, frequent headaches, memory loss, confusion.  Psych: Negative for anxiety, depression Endo: Negative for unusual weight change.  Heme: Negative for bruising or bleeding. Allergy: Negative for rash or hives.  Physical Exam: Vital signs  in last 24 hours: Temp:  [98.1 F (36.7 C)] 98.1 F (36.7 C) (07/15 0719) Pulse Rate:  [71] 71 (07/15 0719) Resp:  [15] 15 (07/15 0719) BP: (140)/(97) 140/97 (07/15 0719) SpO2:  [99 %] 99 % (07/15 0719) Weight:  [117.5 kg] 117.5 kg (07/15 0719)   General:   Alert,  Well-developed, well-nourished, pleasant and cooperative in NAD Head:  Normocephalic and atraumatic. Eyes:  Sclera clear, no icterus.   Conjunctiva pink. Ears:  Normal auditory acuity. Nose:  No deformity, discharge,  or lesions. Msk:  Symmetrical without gross deformities. Normal posture. Extremities:  Without clubbing or edema. Neurologic:  Alert and  oriented x4;  grossly normal neurologically. Skin:  Intact without significant lesions or rashes. Psych:  Alert and cooperative. Normal mood and affect.   Impression/Plan: Fernando [name] is here for an EGD with possible dilation to be performed for chronic GERD, dysphagia  Risks, benefits, limitations, imponderables and alternatives regarding EGD have been reviewed with the patient. Questions have been answered. All parties agreeable.

## 2022-07-24 NOTE — Transfer of Care (Signed)
Immediate Anesthesia Transfer of Care Note  Patient: BASSAM DRESCH  Procedure(s) Performed: ESOPHAGOGASTRODUODENOSCOPY (EGD) WITH PROPOFOL BIOPSY  Patient Location: PACU  Anesthesia Type:General  Level of Consciousness: awake, alert , oriented, and patient cooperative  Airway & Oxygen Therapy: Patient Spontanous Breathing  Post-op Assessment: Report given to RN, Post -op Vital signs reviewed and stable, and Patient moving all extremities X 4  Post vital signs: Reviewed and stable  Last Vitals:  Vitals Value Taken Time  BP 143/103 07/24/22 0840  Temp 36.4 C 07/24/22 0840  Pulse 76 07/24/22 0840  Resp 11 07/24/22 0840  SpO2 100 % 07/24/22 0840    Last Pain:  Vitals:   07/24/22 0840  TempSrc: Oral  PainSc: 0-No pain         Complications: No notable events documented.

## 2022-07-24 NOTE — Anesthesia Postprocedure Evaluation (Signed)
Anesthesia Post Note  Patient: Fernando [name]  Procedure(s) Performed: ESOPHAGOGASTRODUODENOSCOPY (EGD) WITH PROPOFOL BIOPSY  Patient location during evaluation: Phase II Anesthesia Type: General Level of consciousness: awake and alert and oriented Pain management: pain level controlled Vital Signs Assessment: post-procedure vital signs reviewed and stable Respiratory status: spontaneous breathing, nonlabored ventilation and respiratory function stable Cardiovascular status: blood pressure returned to baseline and stable Postop Assessment: no apparent nausea or vomiting Anesthetic complications: no  No notable events documented.   Last Vitals:  Vitals:   07/24/22 0719 07/24/22 0840  BP: (!) 140/97 (!) 143/103  Pulse: 71 76  Resp: 15 11  Temp: 36.7 C (!) 36.4 C  SpO2: 99% 100%    Last Pain:  Vitals:   07/24/22 0840  TempSrc: Oral  PainSc: 0-No pain                 Loxley Schmale C Asbury Hair

## 2022-07-24 NOTE — Telephone Encounter (Signed)
Referral placed.

## 2022-07-24 NOTE — Telephone Encounter (Signed)
Patient requesting orthopedic surgeon referral here locally in [place] for arthritis, bilateral knee pain, left hip pain.  Can we make this referral? Thank you

## 2022-07-24 NOTE — Op Note (Signed)
[facility] Patient Name: Fernando [name] Procedure Date: 07/24/2022 8:11 AM MRN: [id number] Date of Birth: 1960-06-24 Attending MD: Hennie Duos. Marletta Lor , [place], [id number] CSN: [id number] Age: 62 Admit Type: Outpatient Procedure:                Upper GI endoscopy Indications:              Dysphagia, Heartburn Providers:                Hennie Duos. Marletta Lor, DO, Angelica Ran, Kristine L.                            Jessee Avers, Technician Referring MD:              Medicines:                See the Anesthesia note for documentation of the                            administered medications Complications:            No immediate complications. Estimated Blood Loss:     Estimated blood loss was minimal. Procedure:                Pre-Anesthesia Assessment:                           - The anesthesia plan was to use monitored                            anesthesia care (MAC).                           After obtaining informed consent, the endoscope was                            passed under direct vision. Throughout the                            procedure, the patient's blood pressure, pulse, and                            oxygen saturations were monitored continuously. The                            [id number]-[id number] ([id number]) scope was introduced through the                            mouth, and advanced to the second part of duodenum.                            The upper GI endoscopy was accomplished without                            difficulty. The patient tolerated the procedure                            well. Scope In: 8:28:12  AM Scope Out: 8:31:49 AM Total Procedure Duration: 0 hours 3 minutes 37 seconds  Findings:      There is no endoscopic evidence of stenosis or stricture in the entire       esophagus.      The Z-line was regular and was found 44 cm from the incisors.      Patchy mild inflammation characterized by erythema was found in the       gastric body and in the gastric antrum.  Biopsies were taken with a cold       forceps for Helicobacter pylori testing.      The duodenal bulb, first portion of the duodenum and second portion of       the duodenum were normal. Impression:               - Z-line regular, 44 cm from the incisors.                           - Gastritis. Biopsied.                           - Normal duodenal bulb, first portion of the                            duodenum and second portion of the duodenum. Moderate Sedation:      Per Anesthesia Care Recommendation:           - Patient has a contact number available for                            emergencies. The signs and symptoms of potential                            delayed complications were discussed with the                            patient. Return to normal activities tomorrow.                            Written discharge instructions were provided to the                            patient.                           - Resume previous diet.                           - Continue present medications.                           - Await pathology results.                           - Return to GI clinic in 3 months.                           - Use Protonix (pantoprazole) 40 mg PO BID. Procedure  Code(s):        --- Professional ---                           [phone], Esophagogastroduodenoscopy, flexible,                            transoral; with biopsy, single or multiple Diagnosis Code(s):        --- Professional ---                           K29.70, Gastritis, unspecified, without bleeding                           R13.10, Dysphagia, unspecified                           R12, Heartburn CPT copyright 2022 American Medical Association. All rights reserved. The codes documented in this report are preliminary and upon coder review may  be revised to meet current compliance requirements. Hennie Duos. Marletta Lor, DO Hennie Duos. Marletta Lor, DO 07/24/2022 8:34:31 AM This report has been signed  electronically. Number of Addenda: 0

## 2022-07-24 NOTE — Anesthesia Preprocedure Evaluation (Addendum)
Anesthesia Evaluation  Patient identified by MRN, date of birth, ID band Patient awake    Reviewed: Allergy & Precautions, H&P , NPO status , Patient's Chart, lab work & pertinent test results  History of Anesthesia Complications (+) AWARENESS UNDER ANESTHESIA and history of anesthetic complications  Airway Mallampati: III  TM Distance: >3 FB Neck ROM: Full   Comment: Cervical spine sx Dental  (+) Dental Advisory Given, Teeth Intact   Pulmonary shortness of breath and with exertion, sleep apnea , pneumonia, COPD,  COPD inhaler, former smoker Left Lung mass, lobectomy    + decreased breath sounds (decreased breath sounds on left side)      Cardiovascular Exercise Tolerance: Good hypertension, Pt. on medications + Peripheral Vascular Disease (Thoracic aortic aneurysm without rupture (HCC)  Normal cardiovascular exam Rhythm:Regular Rate:Normal     Neuro/Psych negative neurological ROS  negative psych ROS   GI/Hepatic Neg liver ROS,GERD  Medicated and Controlled,,  Endo/Other  Hypothyroidism    Renal/GU Renal disease (left renal cancer)  negative genitourinary   Musculoskeletal negative musculoskeletal ROS (+)    Abdominal   Peds negative pediatric ROS (+)  Hematology negative hematology ROS (+)   Anesthesia Other Findings IMPRESSION: No evidence of pulmonary embolism.   Minimal dependent atelectasis in the posterior lungs.   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   This recommendation follows 2010 ACCF/AHA/AATS/ACR/ASA/SCA/SCAI/SIR/STS/SVM Guidelines for the Diagnosis and Management of Patients with Thoracic Aortic Disease. Circulation. 2010; 121: Z610-R604. Aortic aneurysm NOS (ICD10-I71.9)   Aortic Atherosclerosis (ICD10-I70.0).   Aortic aneurysm NOS (ICD10-I71.9).     Electronically Signed   By: Ulyses Southward M.D.   On:  03/12/2020 13:18   Reproductive/Obstetrics negative OB ROS                             Anesthesia Physical Anesthesia Plan  ASA: 3  Anesthesia Plan: General   Post-op Pain Management: Minimal or no pain anticipated   Induction: Intravenous  PONV Risk Score and Plan: 1 and Propofol infusion  Airway Management Planned: Nasal Cannula and Natural Airway  Additional Equipment:   Intra-op Plan:   Post-operative Plan:   Informed Consent: I have reviewed the patients History and Physical, chart, labs and discussed the procedure including the risks, benefits and alternatives for the proposed anesthesia with the patient or authorized representative who has indicated his/her understanding and acceptance.       Plan Discussed with: CRNA and Surgeon  Anesthesia Plan Comments:        Anesthesia Quick Evaluation

## 2022-07-24 NOTE — Addendum Note (Signed)
Addended by: Armstead Peaks on: 07/24/2022 09:34 AM   Modules accepted: Orders

## 2022-07-24 NOTE — Discharge Instructions (Addendum)
EGD Discharge instructions Please read the instructions outlined below and refer to this sheet in the next few weeks. These discharge instructions provide you with general information on caring for yourself after you leave the hospital. Your doctor may also give you specific instructions. While your treatment has been planned according to the most current medical practices available, unavoidable complications occasionally occur. If you have any problems or questions after discharge, please call your doctor. ACTIVITY You may resume your regular activity but move at a slower pace for the next 24 hours.  Take frequent rest periods for the next 24 hours.  Walking will help expel (get rid of) the air and reduce the bloated feeling in your abdomen.  No driving for 24 hours (because of the anesthesia (medicine) used during the test).  You may shower.  Do not sign any important legal documents or operate any machinery for 24 hours (because of the anesthesia used during the test).  NUTRITION Drink plenty of fluids.  You may resume your normal diet.  Begin with a light meal and progress to your normal diet.  Avoid alcoholic beverages for 24 hours or as instructed by your caregiver.  MEDICATIONS You may resume your normal medications unless your caregiver tells you otherwise.  WHAT YOU CAN EXPECT TODAY You may experience abdominal discomfort such as a feeling of fullness or "gas" pains.  FOLLOW-UP Your doctor will discuss the results of your test with you.  SEEK IMMEDIATE MEDICAL ATTENTION IF ANY OF THE FOLLOWING OCCUR: Excessive nausea (feeling sick to your stomach) and/or vomiting.  Severe abdominal pain and distention (swelling).  Trouble swallowing.  Temperature over 101 F (37.8 C).  Rectal bleeding or vomiting of blood.    Your esophagus was wide open, I did not need to stretch it today.  You did have mild amount inflammation of stomach which I took biopsies of to rule infection a bacteria  called H. pylori. Await pathology results, my office will contact you.  Small bowel appeared normal.  I am going to increase your pantoprazole to 40 mg twice daily.  I have sent this to your pharmacy.  I want you to take this until you see [place] back in clinic.  You can start reintroducing solid foods back in your diet.  Follow-up in GI office in 3 months.  I will refer you to orthopedic clinic here locally.  I hope you have a great rest of your week!  Hennie Duos. Marletta Lor, D.O. Gastroenterology and Hepatology [facility]

## 2022-07-25 LAB — SURGICAL PATHOLOGY

## 2022-07-27 ENCOUNTER — Telehealth: Payer: Self-pay

## 2022-07-27 ENCOUNTER — Other Ambulatory Visit: Payer: Self-pay | Admitting: Internal Medicine

## 2022-07-27 ENCOUNTER — Encounter (HOSPITAL_COMMUNITY): Payer: Self-pay | Admitting: Internal Medicine

## 2022-07-27 MED ORDER — BISMUTH 262 MG PO CHEW: 2.0000 | CHEWABLE_TABLET | Freq: Four times a day (QID) | ORAL | 0 refills | Status: AC

## 2022-07-27 MED ORDER — METRONIDAZOLE 500 MG PO TABS: 500.0000 mg | ORAL_TABLET | Freq: Three times a day (TID) | ORAL | 0 refills | Status: AC

## 2022-07-27 MED ORDER — TETRACYCLINE HCL 500 MG PO CAPS: 500.0000 mg | ORAL_CAPSULE | Freq: Four times a day (QID) | ORAL | 0 refills | Status: DC

## 2022-07-27 NOTE — Telephone Encounter (Signed)
[facility] phoned advising that they do not have any Tetracycline. Wanting to know do you want it changed to Dicyclomine for the pt. Please send in a different Rx or approve Dicyclomine. Please advise

## 2022-07-31 ENCOUNTER — Other Ambulatory Visit: Payer: Self-pay | Admitting: Internal Medicine

## 2022-07-31 MED ORDER — DOXYCYCLINE HYCLATE 100 MG PO TBEC: 100.0000 mg | DELAYED_RELEASE_TABLET | Freq: Two times a day (BID) | ORAL | 0 refills | Status: AC

## 2022-07-31 NOTE — Telephone Encounter (Signed)
Doxycycline sent to patient's pharmacy, thanks

## 2022-08-01 NOTE — Telephone Encounter (Signed)
Noted, pt advised of this through his MyChart

## 2022-08-16 ENCOUNTER — Encounter: Payer: Self-pay | Admitting: *Deleted

## 2022-08-16 ENCOUNTER — Encounter: Payer: Self-pay | Admitting: Cardiology

## 2022-08-16 ENCOUNTER — Ambulatory Visit: Payer: Medicaid Other | Attending: Cardiology | Admitting: Cardiology

## 2022-08-16 VITALS — BP 118/88 | HR 82 | Ht 74.0 in | Wt 269.0 lb

## 2022-08-16 DIAGNOSIS — I7121 Aneurysm of the ascending aorta, without rupture: Secondary | ICD-10-CM | POA: Diagnosis not present

## 2022-08-16 DIAGNOSIS — Z01810 Encounter for preprocedural cardiovascular examination: Secondary | ICD-10-CM

## 2022-08-16 DIAGNOSIS — R0789 Other chest pain: Secondary | ICD-10-CM | POA: Diagnosis not present

## 2022-08-16 DIAGNOSIS — R0609 Other forms of dyspnea: Secondary | ICD-10-CM | POA: Diagnosis not present

## 2022-08-16 NOTE — Patient Instructions (Addendum)
Medication Instructions:   Continue all current medications.   Labwork:  none  Testing/Procedures:  Your physician has requested that you have a lexiscan myoview. For further information please visit [email or web address] Please follow instruction sheet, as given. Non-Cardiac CT Angiography (CTA), is a special type of CT scan that uses a computer to produce multi-dimensional views of major blood vessels throughout the body. In CT angiography, a contrast material is injected through an IV to help visualize the blood vessels  Office will contact with results via phone, letter or mychart.     Follow-Up:  Pending test results   Any Other Special Instructions Will Be Listed Below (If Applicable).   If you need a refill on your cardiac medications before your next appointment, please call your pharmacy.

## 2022-08-16 NOTE — Progress Notes (Addendum)
Clinical Summary Fernando [name] is a 62 y.o.male seen today as a new patient. Last seen by Dr Purvis Sheffield in 2017  1.History of chest pain - cath 2011 no significant disease - Echocardiogram 08/27/15 normal left ventricular systolic function, LVEF 60-65%, normal regional wall motion, mild mitral regurgitation.  2017 nuclear sterss: no ischemia. Reports significant side effects to dobutamine, "said he felt like he was dying"  - some left sided chest pains at times. Tender to press on, worst with position. No exertioanl chest pain   2.COPD   3. EtOH abuse  4.Preop evaluation - considering knee replacement - DOE with walking short distances, he attributes to his COPD. Has progressed. Some cough, some wheezing. Not using maintence inhaler - DOE with walking 2 flights of stairs.    5. Aortic aneurysm - 4.4 cm ascending aortic aneurysm noted 03/2020 CT PE    Truck driver Past Medical History:  Diagnosis Date   Acute respiratory failure with hypoxia (HCC) 08/27/2015   Mild-Oxygen saturation 88% with ambulation.   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   Pancreatitis    Peri-rectal abscess    multiple.    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      amLODipine (NORVASC) 5 MG tablet Take 1 tablet (5 mg total) by mouth daily. (Patient taking differently: Take 5 mg by mouth daily at 6 (six) AM.) 30 tablet 0   enalapril (VASOTEC) 10 MG tablet Take 10 mg by mouth daily at 6 (six) AM.     furosemide (LASIX) 20 MG tablet Take 20 mg by mouth daily as needed (fluid retention.).     ondansetron (ZOFRAN) 4 MG tablet Take 4 mg by mouth every 8 (eight) hours as needed for nausea or vomiting (with oxycodone-apap).     oxyCODONE-acetaminophen (PERCOCET/ROXICET) 5-325 MG tablet Take 1 tablet by mouth every 6 (six) hours as needed for severe pain. 8 tablet 0   pantoprazole (PROTONIX) 40 MG tablet Take 1 tablet (40 mg total) by mouth 2 (two) times daily. 60 tablet 11   potassium chloride SA (KLOR-CON M) 20 MEQ tablet Take 20 mEq by mouth daily as needed (cramping).     No current facility-administered medications for this visit.     Past Surgical History:  Procedure Laterality Date   ABSCESS DRAINAGE     APPENDECTOMY     BIOPSY  07/24/2022   Procedure: BIOPSY;  Surgeon: Lanelle Bal, DO;  [facility]: [facility];  Service: Endoscopy;;   CERVICAL FUSION     COLONOSCOPY     CYSTOSCOPY N/A  05/01/2013   Procedure: CYSTOSCOPY;  Surgeon: Ky Barban, MD;  Location: [facility];  Service: Urology;  Laterality: N/A;   ESOPHAGOGASTRODUODENOSCOPY (EGD) WITH PROPOFOL N/A 07/24/2022   Procedure: ESOPHAGOGASTRODUODENOSCOPY (EGD) WITH PROPOFOL;  Surgeon: Lanelle Bal, DO;  Location: [facility];  Service: Endoscopy;  Laterality: N/A;  8:00am, asa 3   EVALUATION UNDER ANESTHESIA WITH FISTULECTOMY N/A 06/23/2015   Procedure: ANAL EXAM UNDER ANESTHESIA  FISTULOTOMY;  Surgeon: Romie Levee, MD;  Location: [facility] [place];  Service: General;  Laterality: N/A;   FACIAL COSMETIC SURGERY     HERNIA REPAIR     INCISION AND DRAINAGE ABSCESS Right 05/01/2013   Procedure: INCISION AND DRAINAGE RIGHT SCROTAL ABSCESS;  Surgeon: Ky Barban, MD;   Location: [facility];  Service: Urology;  Laterality: Right;   INCISION AND DRAINAGE PERIRECTAL ABSCESS     KNEE ARTHROSCOPY     SPINE SURGERY     cervical   VIDEO ASSISTED THORACOSCOPY (VATS)/ LOBECTOMY Left 05/03/2015   Procedure: LEFT VIDEO ASSISTED THORACOSCOPY (VATS)/ LEFT LOWER LOBECTOMY, ON-Q CATHETER PLACEMENT;  Surgeon: Loreli Slot, MD;  Location: [facility];  Service: Thoracic;  Laterality: Left;   VIDEO BRONCHOSCOPY WITH ENDOBRONCHIAL ULTRASOUND N/A 04/22/2015   Procedure: VIDEO BRONCHOSCOPY WITH ENDOBRONCHIAL ULTRASOUND;  Surgeon: Loreli Slot, MD;  Location: [facility];  Service: Thoracic;  Laterality: N/A;     Allergies  Allergen Reactions   Morphine And Codeine Other (See Comments)    hallucinations      Family History  Problem Relation Age of Onset   Breast cancer Mother    Thyroid disease Mother      Social History Mr. Sasse reports that he quit smoking about 7 years ago. His smoking use included cigarettes. He started smoking about 22 years ago. He has a 7.5 pack-year smoking history. He has never used smokeless tobacco. Mr. Hollenback reports current alcohol use.   Review of Systems CONSTITUTIONAL: No weight loss, fever, chills, weakness or fatigue.  HEENT: Eyes: No visual loss, blurred vision, double vision or yellow sclerae.No hearing loss, sneezing, congestion, runny nose or sore throat.  SKIN: No rash or itching.  CARDIOVASCULAR: per hpi RESPIRATORY: No shortness of breath, cough or sputum.  GASTROINTESTINAL: No anorexia, nausea, vomiting or diarrhea. No abdominal pain or blood.  GENITOURINARY: No burning on urination, no polyuria NEUROLOGICAL: No headache, dizziness, syncope, paralysis, ataxia, numbness or tingling in the extremities. No change in bowel or bladder control.  MUSCULOSKELETAL: chronic knee pain LYMPHATICS: No enlarged nodes. No history of splenectomy.  PSYCHIATRIC: No history of depression or anxiety.  ENDOCRINOLOGIC: No reports of  sweating, cold or heat intolerance. No polyuria or polydipsia.  Marland Kitchen   Physical Examination Today's Vitals   08/16/22 0859  BP: 118/88  Pulse: 82  SpO2: 96%  Weight: 269 lb (122 kg)  Height: 6\' 2"  (1.88 m)   Body mass index is 34.54 kg/m.  Gen: resting comfortably, no acute distress HEENT: no scleral icterus, pupils equal round and reactive, no palptable cervical adenopathy,  CV: RRR, no m/rg, no jvd Resp: Clear to auscultation bilaterally GI: abdomen is soft, non-tender, non-distended, normal bowel sounds, no hepatosplenomegaly MSK: extremities are warm, no edema.  Skin: warm, no rash Neuro:  no focal deficits Psych: appropriate affect     Assessment and Plan  1.Aortic aneurysm - needs repeat CTA, will order  2. History of chest pain - prior testing has been benign with cath in 2011 and stress test 2017 - recent  chest pain atypical, monitor at this time  3. DOE - significant DOE with exertion unclear etiology. May be related to his COPD but cannot exclude a cardiac component - plan for lexiscan to further evaluate. Of note no active wheezing today, reports felt terrible with dobutamine in 2017. Had lexi in 2011 without issue. Cannot run on treadmill due to knee pain   4. Preoperative evaluation - considering knee replacement - does not tolerate without symptoms - given his DOE we are planning for lexiscan as listed above. Hold on surgery until results are back.   F/u pending test results.   09/12/22 Addendum Normal stress test. Chest CTA shows moderate aortic aneurysm that will be monitored. Ok to proceed with knee replacement from a cardiac standpoint.    Antoine Poche, M.D.

## 2022-08-21 ENCOUNTER — Encounter (HOSPITAL_COMMUNITY): Payer: Medicaid Other

## 2022-08-21 ENCOUNTER — Encounter (HOSPITAL_COMMUNITY): Admission: RE | Admit: 2022-08-21 | Payer: Medicaid Other | Source: Ambulatory Visit

## 2022-08-23 ENCOUNTER — Telehealth (HOSPITAL_COMMUNITY): Payer: Self-pay | Admitting: Emergency Medicine

## 2022-08-23 NOTE — Telephone Encounter (Signed)
Pt is well informed on instructions and arrival time for Lexiscan.

## 2022-08-24 ENCOUNTER — Telehealth: Payer: Self-pay | Admitting: Cardiology

## 2022-08-24 NOTE — Telephone Encounter (Signed)
Patient was scheduled for stress test on 08-25-2022 at [facility]. His insurance is pending. Test was cancelled until insurance approves. Patient wanted to know if there was another test that Dr. Wyline Mood might could order. He is still trying to get his knee surgery.

## 2022-08-25 ENCOUNTER — Encounter (HOSPITAL_COMMUNITY): Payer: Medicaid Other

## 2022-08-28 ENCOUNTER — Telehealth: Payer: Self-pay | Admitting: Cardiology

## 2022-08-28 NOTE — Telephone Encounter (Signed)
Checking percert on the following patient for testing scheduled at [facility].    CT ANGIO CHEST AORTA W/CM  09/06/2022

## 2022-08-31 ENCOUNTER — Telehealth: Payer: Self-pay | Admitting: Cardiology

## 2022-08-31 NOTE — Telephone Encounter (Signed)
Wilson, Jasmin B1 hour ago (1:15 PM)   JW Patient's mother returned staff call and stated can contact patient directly at [phone].      Note   Lonne, [place] [phone]  Annetta Maw

## 2022-08-31 NOTE — Telephone Encounter (Signed)
Addressed in previous note

## 2022-08-31 NOTE — Telephone Encounter (Addendum)
Patient notified and verbalized understanding.   Will send back to schedulers to check on pre-cert.

## 2022-08-31 NOTE — Telephone Encounter (Signed)
Patient's mother returned staff call and stated can contact patient directly at [phone].

## 2022-08-31 NOTE — Telephone Encounter (Signed)
Mobile number - vm not set up Left message to return call on home phone.

## 2022-08-31 NOTE — Telephone Encounter (Signed)
Left message to return call 

## 2022-09-06 ENCOUNTER — Ambulatory Visit (HOSPITAL_COMMUNITY)
Admission: RE | Admit: 2022-09-06 | Discharge: 2022-09-06 | Disposition: A | Discharge status: Home / Self Care | Payer: No Typology Code available for payment source | Source: Ambulatory Visit | Attending: Cardiology | Admitting: Cardiology

## 2022-09-06 DIAGNOSIS — I7121 Aneurysm of the ascending aorta, without rupture: Secondary | ICD-10-CM | POA: Diagnosis present

## 2022-09-06 LAB — POCT I-STAT CREATININE: Creatinine, Ser: 1.2 mg/dL (ref 0.61–1.24)

## 2022-09-06 MED ORDER — IOHEXOL 350 MG/ML SOLN
75.0000 mL | Freq: Once | INTRAVENOUS | Status: AC | PRN
  Administered 2022-09-06: 75 mL via INTRAVENOUS

## 2022-09-07 ENCOUNTER — Telehealth: Payer: Self-pay | Admitting: Cardiology

## 2022-09-07 NOTE — Telephone Encounter (Signed)
Spoke with Mr. Stetzer. He is going to contact [facility] regarding his percert for [employer] [facility] .

## 2022-09-07 NOTE — Telephone Encounter (Signed)
Patient stated he is returning a call directly to [facility] to report that "[facility] told him they did not have anything sent from [facility] on 8/8, Auth# [id number]".  Patient stated [facility] told him they only received the scan.

## 2022-09-08 ENCOUNTER — Telehealth (HOSPITAL_COMMUNITY): Payer: Self-pay | Admitting: Emergency Medicine

## 2022-09-12 ENCOUNTER — Encounter (HOSPITAL_BASED_OUTPATIENT_CLINIC_OR_DEPARTMENT_OTHER)
Admission: RE | Admit: 2022-09-12 | Discharge: 2022-09-12 | Disposition: A | Discharge status: Home / Self Care | Payer: No Typology Code available for payment source | Source: Ambulatory Visit | Attending: Cardiology | Admitting: Cardiology

## 2022-09-12 ENCOUNTER — Ambulatory Visit (HOSPITAL_COMMUNITY)
Admission: RE | Admit: 2022-09-12 | Discharge: 2022-09-12 | Disposition: A | Discharge status: Home / Self Care | Payer: No Typology Code available for payment source | Source: Ambulatory Visit | Attending: Cardiology | Admitting: Cardiology

## 2022-09-12 DIAGNOSIS — R0609 Other forms of dyspnea: Secondary | ICD-10-CM

## 2022-09-12 LAB — NM MYOCAR MULTI W/SPECT W/WALL MOTION / EF
LV dias vol: 97 mL (ref 62–150)
LV sys vol: 43 mL
Nuc Stress EF: 55 %
Peak HR: 94 {beats}/min
RATE: 0.3
Rest HR: 68 {beats}/min
Rest Nuclear Isotope Dose: 10.7 mCi
SDS: 0
SRS: 0
SSS: 0
ST Depression (mm): 0 mm
Stress Nuclear Isotope Dose: 30.2 mCi
TID: 1.05

## 2022-09-12 MED ORDER — SODIUM CHLORIDE FLUSH 0.9 % IV SOLN
INTRAVENOUS | Status: AC
  Filled 2022-09-12: qty 10

## 2022-09-12 MED ORDER — TECHNETIUM TC 99M TETROFOSMIN IV KIT
10.7000 | PACK | Freq: Once | INTRAVENOUS | Status: AC | PRN
  Administered 2022-09-12: 10.7 via INTRAVENOUS

## 2022-09-12 MED ORDER — REGADENOSON 0.4 MG/5ML IV SOLN
INTRAVENOUS | Status: AC
  Filled 2022-09-12: qty 5

## 2022-09-12 MED ORDER — TECHNETIUM TC 99M TETROFOSMIN IV KIT
30.2000 | PACK | Freq: Once | INTRAVENOUS | Status: AC | PRN
  Administered 2022-09-12: 30.2 via INTRAVENOUS

## 2022-09-13 ENCOUNTER — Telehealth: Payer: Self-pay | Admitting: *Deleted

## 2022-09-13 NOTE — Telephone Encounter (Signed)
Patient notified, copy to pcp.  States that Dr. Linna Caprice [facility]) has already seen our notes & has scheduled him an appointment for 09/29/2022.

## 2022-09-13 NOTE — Telephone Encounter (Signed)
STRESS TEST -  Stress test looks good, no evidence of any significant blockages. He is ok to proceed with his planned surgery. Please send my addended 08/16/22 clinic note to his surgeon   Dominga Ferry MD   CT ANGIO -  CT shows moderate aortic aneurysm, just something for [place] to monitor at this time with another repeat CT in 1 year. Needs to f/u with [place] in clnic in 6 months   Dominga Ferry MD

## 2022-09-27 ENCOUNTER — Telehealth: Payer: Self-pay

## 2022-09-27 NOTE — Telephone Encounter (Signed)
Pre-operative Risk Assessment    Patient Name: Fernando [name]  DOB: 04-15-1960 MRN: [id number]   DATE OF LAST OFFICE VISIT: 08/16/22  UPCOMING VISIT: 3/5/225  Request for Surgical Clearance    Procedure:  Left total knee arthroplasty   Date of Surgery:  Clearance TBD                                 Surgeon:  Dr. Samson Frederic  Surgeon's Group or Practice Name:  [facility] Phone number:  [phone] Fax number:  [phone]   Type of Clearance Requested:   - Medical    Type of Anesthesia:  Spinal   Additional requests/questions:    SignedVernard Gambles   09/27/2022, 5:16 PM

## 2022-09-28 NOTE — Telephone Encounter (Signed)
Name: Fernando [name]  DOB: 1960-06-06  MRN: [id number]   Primary Cardiologist: None  Chart reviewed as part of pre-operative protocol coverage. Fernando [name] was last seen on 08/16/22 by Dr. Dina Rich.    From Dr. Verna Czech note on 08/16/22:  09/12/22 Addendum Normal stress test. Chest CTA shows moderate aortic aneurysm that will be monitored. Ok to proceed with knee replacement from a cardiac standpoint.   I have routed Dr. Reginia Naas noted to requesting office and will remove from pre-op pool. Please call with questions.  Rip Harbour, NP 09/28/2022, 8:52 AM

## 2022-09-29 NOTE — Telephone Encounter (Signed)
Patient called to follow-up on the status of his clearance and wants clearance sent to [facility] at [phone] so he can get scheduled for surgery next week.

## 2022-10-08 ENCOUNTER — Ambulatory Visit: Payer: Self-pay | Admitting: Student

## 2022-10-08 DIAGNOSIS — E119 Type 2 diabetes mellitus without complications: Secondary | ICD-10-CM

## 2022-10-17 NOTE — Patient Instructions (Signed)
SURGICAL WAITING ROOM VISITATION  Patients having surgery or a procedure may have no more than 2 support people in the waiting area - these visitors may rotate.    Children under the age of 21 must have an adult with them who is not the patient.  Due to an increase in RSV and influenza rates and associated hospitalizations, children ages 55 and under may not visit patients in [facility] hospitals.  If the patient needs to stay at the hospital during part of their recovery, the visitor guidelines for inpatient rooms apply. Pre-op nurse will coordinate an appropriate time for 1 support person to accompany patient in pre-op.  This support person may not rotate.    Please refer to the [facility] website for the visitor guidelines for Inpatients (after your surgery is over and you are in a regular room).       Your procedure is scheduled on: 11/01/22   Report to [facility] Main Entrance    Report to admitting at 7 AM   Call this number if you have problems the morning of surgery [phone]   Do not eat food :After Midnight.   After Midnight you may have the following liquids until 6:30 AM DAY OF SURGERY  Water Non-Citrus Juices (without pulp, NO RED-Apple, White grape, White cranberry) Black Coffee (NO MILK/CREAM OR CREAMERS, sugar ok)  Clear Tea (NO MILK/CREAM OR CREAMERS, sugar ok) regular and decaf                             Plain Jell-O (NO RED)                                           Fruit ices (not with fruit pulp, NO RED)                                     Popsicles (NO RED)                                                               Sports drinks like Gatorade (NO RED)                The day of surgery:  Drink ONE (1) Pre-Surgery Clear Ensure at  6:30 AM the morning of surgery. Drink in one sitting. Do not sip.  This drink was given to you during your hospital  pre-op appointment visit. Nothing else to drink after completing the  Pre-Surgery Clear  Ensure.     Oral Hygiene is also important to reduce your risk of infection.                                    Remember - BRUSH YOUR TEETH THE MORNING OF SURGERY WITH YOUR REGULAR TOOTHPASTE   Do NOT smoke after Midnight   Stop all vitamins and herbal supplements 7 days before surgery.   Take these medicines the morning of surgery with A SIP OF WATER:  Amlodipine,  Pantoprazole, Tylenol if needed.             You may not have any metal on your body including hair pins, jewelry, and body piercing             Do not wear make-up, lotions, powders, cologne, or deodorant              Men may shave face and neck.   Do not bring valuables to the hospital. Agency IS NOT             RESPONSIBLE   FOR VALUABLES.   Contacts, glasses, dentures or bridgework may not be worn into surgery.  DO NOT BRING YOUR HOME MEDICATIONS TO THE HOSPITAL. PHARMACY WILL DISPENSE MEDICATIONS LISTED ON YOUR MEDICATION LIST TO YOU DURING YOUR ADMISSION IN THE HOSPITAL!    Patients discharged on the day of surgery will not be allowed to drive home.  Someone NEEDS to stay with you for the first 24 hours after anesthesia.   Special Instructions: Bring a copy of your healthcare power of attorney and living will documents the day of surgery if you haven't scanned them before.              Please read over the following fact sheets you were given: IF YOU HAVE QUESTIONS ABOUT YOUR PRE-OP INSTRUCTIONS PLEASE CALL [phone] Fernando [name]   If you received a COVID test during your pre-op visit  it is requested that you wear a mask when out in public, stay away from anyone that may not be feeling well and notify your surgeon if you develop symptoms. If you test positive for Covid or have been in contact with anyone that has tested positive in the last 10 days please notify you surgeon.      Pre-operative 5 CHG [name] Instructions   You can play a key role in reducing the risk of infection after surgery. Your skin needs  to be as free of germs as possible. You can reduce the number of germs on your skin by washing with CHG (chlorhexidine gluconate) soap before surgery. CHG is an antiseptic soap that kills germs and continues to kill germs even after washing.   DO NOT use if you have an allergy to chlorhexidine/CHG or antibacterial soaps. If your skin becomes reddened or irritated, stop using the CHG and notify one of our RNs at [phone].   Please shower with the CHG soap starting 4 days before surgery using the following schedule:     Please keep in mind the following:  DO NOT shave, including legs and underarms, starting the day of your first shower.   You may shave your face at any point before/day of surgery.  Place clean sheets on your bed the day you start using CHG soap. Use a clean washcloth (not used since being washed) for each shower. DO NOT sleep with pets once you start using the CHG.   CHG Shower Instructions:  If you choose to wash your hair and private area, wash first with your normal shampoo/soap.  After you use shampoo/soap, rinse your hair and body thoroughly to remove shampoo/soap residue.  Turn the water OFF and apply about 3 tablespoons (45 ml) of CHG soap to a CLEAN washcloth.  Apply CHG soap ONLY FROM YOUR NECK DOWN TO YOUR TOES (washing for 3-5 minutes)  DO NOT use CHG soap on face, private areas, open wounds, or sores.  Pay special attention to the area where your surgery is  being performed.  If you are having back surgery, having someone wash your back for you may be helpful. Wait 2 minutes after CHG soap is applied, then you may rinse off the CHG soap.  Pat dry with a clean towel  Put on clean clothes/pajamas   If you choose to wear lotion, please use ONLY the CHG-compatible lotions on the back of this paper.     Additional instructions for the day of surgery: DO NOT APPLY any lotions, deodorants, cologne, or perfumes.   Put on clean/comfortable clothes.  Brush your  teeth.  Ask your nurse before applying any prescription medications to the skin.      CHG Compatible Lotions   Aveeno Moisturizing lotion  Cetaphil Moisturizing Cream  Cetaphil Moisturizing Lotion  Clairol Herbal Essence Moisturizing Lotion, Dry Skin  Clairol Herbal Essence Moisturizing Lotion, Extra Dry Skin  Clairol Herbal Essence Moisturizing Lotion, Normal Skin  Curel Age Defying Therapeutic Moisturizing Lotion with Alpha Hydroxy  Curel Extreme Care Body Lotion  Curel Soothing Hands Moisturizing Hand Lotion  Curel Therapeutic Moisturizing Cream, Fragrance-Free  Curel Therapeutic Moisturizing Lotion, Fragrance-Free  Curel Therapeutic Moisturizing Lotion, Original Formula  Eucerin Daily Replenishing Lotion  Eucerin Dry Skin Therapy Plus Alpha Hydroxy Crme  Eucerin Dry Skin Therapy Plus Alpha Hydroxy Lotion  Eucerin Original Crme  Eucerin Original Lotion  Eucerin Plus Crme Eucerin Plus Lotion  Eucerin TriLipid Replenishing Lotion  Keri Anti-Bacterial Hand Lotion  Keri Deep Conditioning Original Lotion Dry Skin Formula Softly Scented  Keri Deep Conditioning Original Lotion, Fragrance Free Sensitive Skin Formula  Keri Lotion Fast Absorbing Fragrance Free Sensitive Skin Formula  Keri Lotion Fast Absorbing Softly Scented Dry Skin Formula  Keri Original Lotion  Keri Skin Renewal Lotion Keri Silky Smooth Lotion  Keri Silky Smooth Sensitive Skin Lotion  Nivea Body Creamy Conditioning Oil  Nivea Body Extra Enriched Lotion  Nivea Body Original Lotion  Nivea Body Sheer Moisturizing Lotion Nivea Crme  Nivea Skin Firming Lotion  NutraDerm 30 Skin Lotion  NutraDerm Skin Lotion  NutraDerm Therapeutic Skin Cream  NutraDerm Therapeutic Skin Lotion  ProShield Protective Hand Cream    Incentive Spirometer (Watch this video at home: [email or web address])  An incentive spirometer is a tool that can help keep your lungs clear and active. This tool measures  how well you are filling your lungs with each breath. Taking long deep breaths may help reverse or decrease the chance of developing breathing (pulmonary) problems (especially infection) following: A long period of time when you are unable to move or be active. BEFORE THE PROCEDURE  If the spirometer includes an indicator to show your best effort, your nurse or respiratory therapist will set it to a desired goal. If possible, sit up straight or lean slightly forward. Try not to slouch. Hold the incentive spirometer in an upright position. INSTRUCTIONS FOR USE  Sit on the edge of your bed if possible, or sit up as far as you can in bed or on a chair. Hold the incentive spirometer in an upright position. Breathe out normally. Place the mouthpiece in your mouth and seal your lips tightly around it. Breathe in slowly and as deeply as possible, raising the piston or the ball toward the top of the column. Hold your breath for 3-5 seconds or for as long as possible. Allow the piston or ball to fall to the bottom of the column. Remove the mouthpiece from your mouth and breathe out normally. Rest for a few seconds and repeat Steps 1 through  7 at least 10 times every 1-2 hours when you are awake. Take your time and take a few normal breaths between deep breaths. The spirometer may include an indicator to show your best effort. Use the indicator as a goal to work toward during each repetition. After each set of 10 deep breaths, practice coughing to be sure your lungs are clear. If you have an incision (the cut made at the time of surgery), support your incision when coughing by placing a pillow or rolled up towels firmly against it. Once you are able to get out of bed, walk around indoors and cough well. You may stop using the incentive spirometer when instructed by your caregiver.  RISKS AND COMPLICATIONS Take your time so you do not get dizzy or light-headed. If you are in pain, you may need to take or  ask for pain medication before doing incentive spirometry. It is harder to take a deep breath if you are having pain. AFTER USE Rest and breathe slowly and easily. It can be helpful to keep track of a log of your progress. Your caregiver can provide you with a simple table to help with this. If you are using the spirometer at home, follow these instructions: SEEK MEDICAL CARE IF:  You are having difficultly using the spirometer. You have trouble using the spirometer as often as instructed. Your pain medication is not giving enough relief while using the spirometer. You develop fever of 100.5 F (38.1 C) or higher. SEEK IMMEDIATE MEDICAL CARE IF:  You cough up bloody sputum that had not been present before. You develop fever of 102 F (38.9 C) or greater. You develop worsening pain at or near the incision site. MAKE SURE YOU:  Understand these instructions. Will watch your condition. Will get help right away if you are not doing well or get worse. Document Released: 05/08/2006 Document Revised: 03/20/2011 Document Reviewed: 07/09/2006 [facility] Patient Information 2014 [place], [place].

## 2022-10-17 NOTE — Progress Notes (Signed)
COVID Vaccine received:  [x]  No []  Yes Date of any COVID positive Test in last 90 days:  PCP - Lenise Herald PA-C Cardiologist - Dr.    Wyline Mood [facility]  Chest x-ray - 09/06/22 Epic EKG -  06/21/22 EPIC Stress Test - 09/12/22 Epic ECHO - 08/27/15 Cardiac Cath -   Bowel Prep - [x]  No  []   Yes ______  Pacemaker / ICD device [x]  No []  Yes   Spinal Cord Stimulator:[x]  No []  Yes       History of Sleep Apnea? [x]  No []  Yes   CPAP used?- [x]  No []  Yes    Does the patient monitor blood sugar?          [x]  No []  Yes  []  N/A  Patient has: [x]  NO Hx DM   []  Pre-DM                 []  DM1  []   DM2 Does patient have a [employer] or Dexacom? []  No []  Yes   Fasting Blood Sugar Ranges-  Checks Blood Sugar _____ times a day  GLP1 agonist / usual dose - no GLP1 instructions:  SGLT-2 inhibitors / usual dose - no SGLT-2 instructions:   Blood Thinner / Instructions:no Aspirin Instructions:no  Comments:   Activity level: Patient is able  to climb a flight of stairs without difficulty; [x]  No CP  []  No SOB, ___   Patient can perform ADLs without assistance.   Anesthesia review:   Patient denies shortness of breath, fever, cough and chest pain at PAT appointment.  Patient verbalized understanding and agreement to the Pre-Surgical Instructions that were given to them at this PAT appointment. Patient was also educated of the need to review these PAT instructions again prior to his/her surgery.I reviewed the appropriate phone numbers to call if they have any and questions or concerns.

## 2022-10-19 ENCOUNTER — Encounter (HOSPITAL_COMMUNITY)
Admission: RE | Admit: 2022-10-19 | Discharge: 2022-10-19 | Disposition: A | Discharge status: Home / Self Care | Payer: No Typology Code available for payment source | Source: Ambulatory Visit | Attending: Orthopedic Surgery | Admitting: Orthopedic Surgery

## 2022-10-19 ENCOUNTER — Other Ambulatory Visit: Payer: Self-pay

## 2022-10-19 ENCOUNTER — Encounter (HOSPITAL_COMMUNITY): Payer: Self-pay

## 2022-10-19 VITALS — BP 137/93 | HR 82 | Temp 98.2°F | Resp 16 | Ht 74.0 in | Wt 268.0 lb

## 2022-10-19 DIAGNOSIS — I129 Hypertensive chronic kidney disease with stage 1 through stage 4 chronic kidney disease, or unspecified chronic kidney disease: Secondary | ICD-10-CM | POA: Diagnosis not present

## 2022-10-19 DIAGNOSIS — Z01812 Encounter for preprocedural laboratory examination: Secondary | ICD-10-CM | POA: Diagnosis present

## 2022-10-19 DIAGNOSIS — E119 Type 2 diabetes mellitus without complications: Secondary | ICD-10-CM | POA: Insufficient documentation

## 2022-10-19 DIAGNOSIS — Z981 Arthrodesis status: Secondary | ICD-10-CM | POA: Diagnosis not present

## 2022-10-19 DIAGNOSIS — R0609 Other forms of dyspnea: Secondary | ICD-10-CM | POA: Diagnosis not present

## 2022-10-19 DIAGNOSIS — N189 Chronic kidney disease, unspecified: Secondary | ICD-10-CM | POA: Diagnosis not present

## 2022-10-19 DIAGNOSIS — J449 Chronic obstructive pulmonary disease, unspecified: Secondary | ICD-10-CM | POA: Insufficient documentation

## 2022-10-19 DIAGNOSIS — I700 Atherosclerosis of aorta: Secondary | ICD-10-CM | POA: Insufficient documentation

## 2022-10-19 DIAGNOSIS — F17210 Nicotine dependence, cigarettes, uncomplicated: Secondary | ICD-10-CM | POA: Insufficient documentation

## 2022-10-19 DIAGNOSIS — Z905 Acquired absence of kidney: Secondary | ICD-10-CM | POA: Diagnosis not present

## 2022-10-19 DIAGNOSIS — F1010 Alcohol abuse, uncomplicated: Secondary | ICD-10-CM | POA: Diagnosis not present

## 2022-10-19 DIAGNOSIS — I2510 Atherosclerotic heart disease of native coronary artery without angina pectoris: Secondary | ICD-10-CM | POA: Insufficient documentation

## 2022-10-19 DIAGNOSIS — F419 Anxiety disorder, unspecified: Secondary | ICD-10-CM | POA: Diagnosis not present

## 2022-10-19 DIAGNOSIS — I712 Thoracic aortic aneurysm, without rupture, unspecified: Secondary | ICD-10-CM | POA: Insufficient documentation

## 2022-10-19 DIAGNOSIS — I10 Essential (primary) hypertension: Secondary | ICD-10-CM

## 2022-10-19 DIAGNOSIS — Z01818 Encounter for other preprocedural examination: Secondary | ICD-10-CM

## 2022-10-19 HISTORY — DX: Anxiety disorder, unspecified: F41.9

## 2022-10-19 HISTORY — DX: Chronic obstructive pulmonary disease, unspecified: J44.9

## 2022-10-19 HISTORY — DX: Chronic kidney disease, unspecified: N18.9

## 2022-10-19 HISTORY — DX: Unspecified osteoarthritis, unspecified site: M19.90

## 2022-10-19 LAB — CBC
HCT: 43.5 % (ref 39.0–52.0)
Hemoglobin: 14.8 g/dL (ref 13.0–17.0)
MCH: 32.7 pg (ref 26.0–34.0)
MCHC: 34 g/dL (ref 30.0–36.0)
MCV: 96 fL (ref 80.0–100.0)
Platelets: 274 10*3/uL (ref 150–400)
RBC: 4.53 MIL/uL (ref 4.22–5.81)
RDW: 13.3 % (ref 11.5–15.5)
WBC: 8.9 10*3/uL (ref 4.0–10.5)
nRBC: 0 % (ref 0.0–0.2)

## 2022-10-19 LAB — SURGICAL PCR SCREEN
MRSA, PCR: NEGATIVE
Staphylococcus aureus: NEGATIVE

## 2022-10-19 LAB — BASIC METABOLIC PANEL
Anion gap: 9 (ref 5–15)
BUN: 10 mg/dL (ref 6–20)
CO2: 29 mmol/L (ref 22–32)
Calcium: 9 mg/dL (ref 8.9–10.3)
Chloride: 99 mmol/L (ref 98–111)
Creatinine, Ser: 1.06 mg/dL (ref 0.61–1.24)
GFR, Estimated: >60 mL/min (ref >60)
Glucose, Bld: 120 mg/dL — ABNORMAL HIGH (ref 70–99)
Potassium: 3.3 mmol/L — ABNORMAL LOW (ref 3.5–5.1)
Sodium: 137 mmol/L (ref 135–145)

## 2022-10-19 LAB — HEMOGLOBIN A1C
Hgb A1c MFr Bld: 5.8 % — ABNORMAL HIGH (ref 4.8–5.6)
Mean Plasma Glucose: 119.76 mg/dL

## 2022-10-20 NOTE — Anesthesia Preprocedure Evaluation (Addendum)
Anesthesia Evaluation  Patient identified by MRN, date of birth, ID band Patient awake    Reviewed: Allergy & Precautions, H&P , NPO status , Patient's Chart, lab work & pertinent test results  History of Anesthesia Complications (+) AWARENESS UNDER ANESTHESIA and history of anesthetic complications  Airway Mallampati: III  TM Distance: >3 FB Neck ROM: Full   Comment: Cervical spine sx Dental  (+) Dental Advisory Given, Teeth Intact   Pulmonary shortness of breath and with exertion, sleep apnea , pneumonia, COPD,  COPD inhaler, Current Smoker and Patient abstained from smoking. Left Lung mass, lobectomy    + decreased breath sounds (decreased breath sounds on left side)      Cardiovascular Exercise Tolerance: Good hypertension, Pt. on medications + Peripheral Vascular Disease (Thoracic aortic aneurysm without rupture (HCC)  Normal cardiovascular exam Rhythm:Regular Rate:Normal     Neuro/Psych negative neurological ROS  negative psych ROS   GI/Hepatic Neg liver ROS,GERD  Medicated and Controlled,,  Endo/Other  Hypothyroidism    Renal/GU Renal disease (left renal cancer)  negative genitourinary   Musculoskeletal negative musculoskeletal ROS (+)    Abdominal  (+) + obese  Peds negative pediatric ROS (+)  Hematology negative hematology ROS (+)   Anesthesia Other Findings IMPRESSION: No evidence of pulmonary embolism.   Minimal dependent atelectasis in the posterior lungs.   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   This recommendation follows 2010 ACCF/AHA/AATS/ACR/ASA/SCA/SCAI/SIR/STS/SVM Guidelines for the Diagnosis and Management of Patients with Thoracic Aortic Disease. Circulation. 2010; 121: X914-N829. Aortic aneurysm NOS (ICD10-I71.9)   Aortic Atherosclerosis (ICD10-I70.0).   Aortic aneurysm NOS (ICD10-I71.9).      Electronically Signed   By: Ulyses Southward M.D.   On: 03/12/2020 13:18   Reproductive/Obstetrics negative OB ROS                             Anesthesia Physical Anesthesia Plan  ASA: 3  Anesthesia Plan: Spinal   Post-op Pain Management: Tylenol PO (pre-op)*   Induction: Intravenous  PONV Risk Score and Plan: 1 and Propofol infusion and Treatment may vary due to age or medical condition  Airway Management Planned: Natural Airway and Simple Face Mask  Additional Equipment:   Intra-op Plan:   Post-operative Plan:   Informed Consent: I have reviewed the patients History and Physical, chart, labs and discussed the procedure including the risks, benefits and alternatives for the proposed anesthesia with the patient or authorized representative who has indicated his/her understanding and acceptance.       Plan Discussed with: CRNA and Surgeon  Anesthesia Plan Comments: (See PAT note from 10/10 by Sherlie Ban PA-C )       Anesthesia Quick Evaluation

## 2022-10-20 NOTE — Progress Notes (Signed)
DISCUSSION: Fernando [name] is a 62 yo male who presents to PAT prior to R TKA on 11/01/22 with Dr. Linna Caprice. 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   Complication from anesthesia includes intra op awareness  Patient was referred to Cardiology for pre op clearance. Saw Dr. Wyline Mood on 08/16/22. Stress test recommended due to DOE and CTA chest recommended due to hx of TAA. Stress test came back low risk and Chest CTA showed stable TAA of 4.5cm. Cleared for surgery: "Normal stress test. Chest CTA shows moderate aortic aneurysm that will be monitored. Ok to proceed with knee replacement from a cardiac standpoint. "  Follows with PCP for chronic medical conditions. Last seen on 09/28/22. Medically cleared for surgery.  VS: BP (!) 137/93   Pulse 82   Temp 36.8 C (Oral)   Resp 16   Ht 6\' 2"  (1.88 m)   Wt 121.6 kg   SpO2 96%   BMI 34.41 kg/m   PROVIDERS: Shawnie Dapper, PA-C Cardiology: Branch  LABS: Labs reviewed: Acceptable for surgery. (all labs ordered are listed, but only abnormal results are displayed)  Labs Reviewed  HEMOGLOBIN A1C - Abnormal; Notable for the following components:      Result Value   Hgb A1c MFr Bld 5.8 (*)    All other components within normal limits  BASIC METABOLIC PANEL - Abnormal; Notable for the following components:   Potassium 3.3 (*)    Glucose, Bld 120 (*)    All other components within normal limits  SURGICAL PCR SCREEN  CBC     IMAGES:  CTA chest 09/06/22:  IMPRESSION: 1. Aortic atherosclerosis with aneurysmal dilatation of the ascending aorta measuring 4.5 cm. Ascending thoracic aortic aneurysm. Recommend semi-annual imaging followup by CTA or MRA and referral to cardiothoracic surgery if not already obtained. This recommendation follows  2010 ACCF/AHA/AATS/ACR/ASA/SCA/SCAI/SIR/STS/SVM Guidelines for the Diagnosis and Management of Patients With Thoracic Aortic Disease. Circulation. 2010; 121: Z610-R604. Aortic aneurysm NOS (ICD10-I71.9) 2. Coronary artery calcifications. 3. 3 mm left upper lobe pulmonary nodule. No follow-up needed if patient is low-risk.This recommendation follows the consensus statement: Guidelines for Management of Incidental Pulmonary Nodules Detected on CT Images: From the Fleischner Society 2017; Radiology 2017; 284:228-243.   EKG:   CV:  Stress test 09/12/22:    The study is normal. There are no perfusion defects consistent with prior infarct or current ischemia. The study is low risk.   No ST deviation was noted.   Moderate size mild intensity inferior defect most prominent in the resting images with normal wall motion. Finding is consistent with subdiaphragmatic attenuation.   Left ventricular function is normal. Nuclear stress EF: 55%. The left ventricular ejection fraction is normal (55-65%). End diastolic cavity size is normal.  Echo 08/27/2015:  Study Conclusions   - Left ventricle: The cavity size was normal. Wall thickness was    increased in a pattern of mild LVH. Systolic function was normal.    The estimated ejection fraction was in the range of 60% to 65%.    Wall motion was normal; there were no regional wall motion    abnormalities. Left ventricular diastolic function parameters    were normal.  - Aortic valve: Mildly calcified annulus. Trileaflet; mildly    thickened leaflets. Valve area (VTI): 3.12 cm^2. Valve area    (Vmax): 2.91 cm^2. Valve area (  Vmean): 3.02 cm^2.  - Mitral valve: There was mild regurgitation.  - Atrial septum: No defect or patent foramen ovale was identified.  - Pulmonary arteries: Systolic pressure was mildly increased. PA    peak pressure: 36 mm Hg (S).  - Technically adequate study.   Past Medical History:  Diagnosis Date   Acute respiratory  failure with hypoxia (HCC) 08/27/2015   Mild-Oxygen saturation 88% with ambulation.   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   Tobacco abuse    Umbilical hernia    has been repaired    Past Surgical History:  Procedure Laterality Date   ABSCESS DRAINAGE     APPENDECTOMY     BIOPSY  07/24/2022   Procedure: BIOPSY;  Surgeon: Lanelle Bal, DO;  [facility]: [facility];  Service: Endoscopy;;   CERVICAL FUSION     COLONOSCOPY     CYSTOSCOPY N/A 05/01/2013   Procedure: CYSTOSCOPY;  Surgeon: Ky Barban, MD;  Location: [facility];  Service: Urology;  Laterality: N/A;   ESOPHAGOGASTRODUODENOSCOPY (EGD) WITH PROPOFOL N/A 07/24/2022   Procedure: ESOPHAGOGASTRODUODENOSCOPY (EGD) WITH PROPOFOL;  Surgeon: Lanelle Bal, DO;  [facility];  Service: Endoscopy;  Laterality: N/A;  8:00am, asa 3   EVALUATION UNDER ANESTHESIA WITH FISTULECTOMY N/A 06/23/2015   Procedure: ANAL EXAM UNDER ANESTHESIA  FISTULOTOMY;  Surgeon: Romie Levee, MD;  Location: [facility];  Service: General;  Laterality: N/A;   FACIAL COSMETIC SURGERY     HERNIA REPAIR     INCISION AND DRAINAGE ABSCESS Right 05/01/2013   Procedure: INCISION AND DRAINAGE RIGHT SCROTAL ABSCESS;  Surgeon: Ky Barban, MD;  Location: [facility];  Service: Urology;  Laterality: Right;   INCISION AND DRAINAGE PERIRECTAL ABSCESS     KNEE ARTHROSCOPY     SPINE SURGERY     cervical   VIDEO ASSISTED THORACOSCOPY  (VATS)/ LOBECTOMY Left 05/03/2015   Procedure: LEFT VIDEO ASSISTED THORACOSCOPY (VATS)/ LEFT LOWER LOBECTOMY, ON-Q CATHETER PLACEMENT;  Surgeon: Loreli Slot, MD;  Location: [facility];  Service: Thoracic;  Laterality: Left;   VIDEO BRONCHOSCOPY WITH ENDOBRONCHIAL ULTRASOUND N/A 04/22/2015   Procedure: VIDEO BRONCHOSCOPY WITH ENDOBRONCHIAL ULTRASOUND;  Surgeon: Loreli Slot, MD;  Location: [facility];  Service: Thoracic;  Laterality: N/A;    MEDICATIONS:  acetaminophen (TYLENOL) 500 MG tablet   ALPRAZolam (XANAX) 1 MG tablet   amLODipine (NORVASC) 5 MG tablet   furosemide (LASIX) 20 MG tablet   ibuprofen (ADVIL) 200 MG tablet   Multiple Vitamin (MULTI VITAMIN PO)   olmesartan (BENICAR) 20 MG tablet   oxyCODONE-acetaminophen (PERCOCET/ROXICET) 5-325 MG tablet   pantoprazole (PROTONIX) 40 MG tablet   potassium chloride SA (KLOR-CON M) 20 MEQ tablet   No current facility-administered medications for this encounter.   Ubaldo Glassing, PA-C [facility] Surgical Short Stay/Anesthesiology  [facility] Phone [phone] 10/20/2022 2:24 PM

## 2022-10-31 ENCOUNTER — Ambulatory Visit: Payer: Self-pay | Admitting: Student

## 2022-10-31 NOTE — H&P (Signed)
TOTAL KNEE ADMISSION H&P  Patient is being admitted for right total knee arthroplasty.  Subjective:  Chief Complaint:right knee pain.  HPI: Fernando [name], 61 y.o. male, has a history of pain and functional disability in the right knee due to arthritis and has failed non-surgical conservative treatments for greater than 12 weeks to includeNSAID's and/or analgesics, corticosteriod injections, flexibility and strengthening excercises, use of assistive devices, and activity modification.  Onset of symptoms was gradual, starting 10 years ago with rapidlly worsening course since that time. The patient noted no past surgery on the right knee(s).  Patient currently rates pain in the right knee(s) at 10 out of 10 with activity. Patient has night pain, worsening of pain with activity and weight bearing, pain that interferes with activities of daily living, pain with passive range of motion, crepitus, and joint swelling.  Patient has evidence of subchondral cysts, subchondral sclerosis, periarticular osteophytes, and joint space narrowing by imaging studies. There is no active infection.  Patient Active Problem List   Diagnosis Date Noted   Pancreatitis 06/21/2022   Thoracic aortic aneurysm without rupture (HCC) 05/18/2020   Costochondral chest pain 10/12/2016   Hypothyroidism 08/27/2015   Acute respiratory failure with hypoxia (HCC) 08/27/2015   COPD (chronic obstructive pulmonary disease) (HCC) 08/27/2015   Obstructive sleep apnea 08/27/2015   Dyspnea on exertion 08/26/2015   Left-sided chest pain 08/26/2015   Obesity 08/26/2015   Anal fistula 08/26/2015   Pedal edema 08/26/2015   Hypokalemia 08/26/2015   Chronic diarrhea 08/26/2015   Essential hypertension 08/26/2015   Pain in the chest    HCAP (healthcare-associated pneumonia) 05/12/2015   Nodule of left lung 05/03/2015   Lung mass 03/30/2015   Peri-rectal abscess 03/07/2015   Abnormal CXR 03/07/2015   Scrotal wall abscess 05/01/2013    Cellulitis 05/01/2013   Hematuria 04/30/2013   Cellulitis of scrotum 04/29/2013   Tick bite 04/29/2013   Hypertension 12/30/2011   Past Medical History:  Diagnosis Date   Acute respiratory failure with hypoxia (HCC) 08/27/2015   Mild-Oxygen saturation 88% with ambulation.   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   Tobacco abuse    Umbilical hernia    has been repaired    Past Surgical History:  Procedure Laterality Date   ABSCESS DRAINAGE     APPENDECTOMY     BIOPSY  07/24/2022   Procedure: BIOPSY;  Surgeon: Lanelle Bal, DO;  [facility];  Service: Endoscopy;;   CERVICAL FUSION     COLONOSCOPY     CYSTOSCOPY N/A 05/01/2013   Procedure: CYSTOSCOPY;  Surgeon: Ky Barban, MD;  Location: [facility];  Service: Urology;  Laterality: N/A;   ESOPHAGOGASTRODUODENOSCOPY (EGD) WITH PROPOFOL N/A 07/24/2022   Procedure: ESOPHAGOGASTRODUODENOSCOPY (EGD) WITH PROPOFOL;  Surgeon: Lanelle Bal, DO;  [facility];  Service: Endoscopy;  Laterality: N/A;  8:00am, asa 3   EVALUATION UNDER ANESTHESIA WITH FISTULECTOMY N/A 06/23/2015   Procedure: ANAL EXAM UNDER ANESTHESIA  FISTULOTOMY;  Surgeon: Romie Levee, MD;  Location: [facility];  Service: General;  Laterality: N/A;   FACIAL COSMETIC SURGERY     HERNIA REPAIR     INCISION AND DRAINAGE ABSCESS Right 05/01/2013   Procedure: INCISION AND DRAINAGE RIGHT SCROTAL ABSCESS;  Surgeon: Ky Barban,  MD;  Location: [facility];  Service: Urology;  Laterality: Right;   INCISION AND DRAINAGE PERIRECTAL ABSCESS     KNEE ARTHROSCOPY     SPINE SURGERY     cervical   VIDEO ASSISTED THORACOSCOPY (VATS)/ LOBECTOMY Left 05/03/2015   Procedure: LEFT VIDEO ASSISTED THORACOSCOPY (VATS)/ LEFT LOWER LOBECTOMY, ON-Q CATHETER PLACEMENT;  Surgeon: Loreli Slot, MD;  Location: [facility];  Service: Thoracic;  Laterality: Left;   VIDEO BRONCHOSCOPY WITH ENDOBRONCHIAL ULTRASOUND N/A 04/22/2015   Procedure: VIDEO BRONCHOSCOPY WITH ENDOBRONCHIAL ULTRASOUND;  Surgeon: Loreli Slot, MD;  Location: [facility];  Service: Thoracic;  Laterality: N/A;    Current Outpatient Medications  Medication Sig Dispense Refill Last Dose   acetaminophen (TYLENOL) 500 MG tablet Take 1,000 mg by mouth every 6 (six) hours as needed for moderate pain.      ALPRAZolam (XANAX) 1 MG tablet Take 1 mg by mouth in the morning, at noon, and at bedtime.      amLODipine (NORVASC) 5 MG tablet Take 1 tablet (5 mg total) by mouth daily. 30 tablet 0    furosemide (LASIX) 20 MG tablet Take 20 mg by mouth daily as needed for edema or fluid.      ibuprofen (ADVIL) 200 MG tablet Take 400 mg by mouth every 6 (six) hours as needed for moderate pain.      Multiple Vitamin (MULTI VITAMIN PO) Take 1 tablet by mouth daily.      olmesartan (BENICAR) 20 MG tablet Take 20 mg by mouth daily.      oxyCODONE-acetaminophen (PERCOCET/ROXICET) 5-325 MG tablet Take 1 tablet by mouth every 6 (six) hours as needed for severe pain. (Patient not taking: Reported on 10/10/2022) 8 tablet 0    pantoprazole (PROTONIX) 40 MG tablet Take 1 tablet (40 mg total) by mouth 2 (two) times daily. (Patient taking differently: Take 20 mg by mouth 2 (two) times daily.) 60 tablet 11    potassium chloride SA (KLOR-CON M) 20 MEQ tablet Take 20 mEq by mouth daily as needed (cramping).      No current facility-administered medications for this visit.   Allergies  Allergen Reactions    Morphine And Codeine Other (See Comments)    hallucinations    Social History   Tobacco Use   Smoking status: Some Days    Current packs/day: 0.00    Average packs/day: 0.5 packs/day for 15.0 years (7.5 ttl pk-yrs)    Types: Cigarettes    Start date: 03/04/2000    Last attempt to quit: 03/05/2015    Years since quitting: 7.6   Smokeless tobacco: Never  Substance Use Topics   Alcohol use: Yes    Comment: occasionally    Family History  Problem Relation Age of Onset   Breast cancer Mother    Thyroid disease Mother      Review of Systems  Musculoskeletal:  Positive for arthralgias, gait problem and joint swelling.  All other systems reviewed and are negative.   Objective:  Physical Exam Constitutional:  Appearance: Normal appearance.  HENT:     Head: Normocephalic and atraumatic.     Nose: Nose normal.     Mouth/Throat:     Mouth: Mucous membranes are moist.     Pharynx: Oropharynx is clear.  Eyes:     Conjunctiva/sclera: Conjunctivae normal.  Cardiovascular:     Rate and Rhythm: Normal rate and regular rhythm.     Pulses: Normal pulses.     Heart sounds: Normal heart sounds.  Pulmonary:     Effort: Pulmonary effort is normal.     Breath sounds: Normal breath sounds.  Abdominal:     General: Abdomen is flat.     Palpations: Abdomen is soft.  Genitourinary:    Comments: deferred Musculoskeletal:     Cervical back: Normal range of motion and neck supple.     Comments: Examination of right knee reveals no skin wounds or lesions. He has swelling, trace effusion. No warmth or erythema. Tenderness palpation medial joint line, lateral joint line, peripatellar and ocular tissues with a positive grind sign. Range of motion 18 to 110 degrees without any ligamentous instability. No extensor lag. Painless range of motion the hip.  Distally, there is no focal motor or sensory deficit. Is palpable pedal pulse.  Skin:    General: Skin is warm and dry.     Capillary  Refill: Capillary refill takes less than 2 seconds.  Neurological:     General: No focal deficit present.     Mental Status: He is alert and oriented to person, place, and time.  Psychiatric:        Mood and Affect: Mood normal.        Behavior: Behavior normal.        Thought Content: Thought content normal.        Judgment: Judgment normal.     Vital signs in last 24 hours: @VSRANGES @  Labs:   Estimated body mass index is 34.41 kg/m as calculated from the following:   Height as of 10/19/22: 6\' 2"  (1.88 m).   Weight as of 10/19/22: 121.6 kg.   Imaging Review Plain radiographs demonstrate severe degenerative joint disease of the right knee(s). The overall alignment issignificant varus. The bone quality appears to be adequate for age and reported activity level.      Assessment/Plan:  End stage arthritis, right knee   The patient history, physical examination, clinical judgment of the provider and imaging studies are consistent with end stage degenerative joint disease of the right knee(s) and total knee arthroplasty is deemed medically necessary. The treatment options including medical management, injection therapy arthroscopy and arthroplasty were discussed at length. The risks and benefits of total knee arthroplasty were presented and reviewed. The risks due to aseptic loosening, infection, stiffness, patella tracking problems, thromboembolic complications and other imponderables were discussed. The patient acknowledged the explanation, agreed to proceed with the plan and consent was signed. Patient is being admitted for inpatient treatment for surgery, pain control, PT, OT, prophylactic antibiotics, VTE prophylaxis, progressive ambulation and ADL's and discharge planning. The patient is planning to be discharged home with OPPT.    Therapy Plans: outpatient therapy. PT at [facility] in [place] 11/06/22.  Disposition: Home with mother Planned DVT Prophylaxis: aspirin 81mg   BID DME needed: has rolling walker.  PCP: Cleared.  Cardiology: Cleared.  TXA: IV Allergies:  - Morphine - hallucinations.  Anesthesia Concerns: None.  BMI: 36.0 Last HgbA1c: Not diabetic.  Other: - Moderate aortic aneurysm.  - Oxycodone, zofran,  methocarbamol, meloxicam.  - 10/19/22: Hgb 14.8, K+ 3.3, Cr. 1.08.      Patient's anticipated LOS is less than 2 midnights, meeting these requirements: - Younger than 20 - Lives within 1 hour of care - Has a competent adult at home to recover with post-op recover - NO history of  - Chronic pain requiring opiods  - Diabetes  - Coronary Artery Disease  - Heart failure  - Heart attack  - Stroke  - DVT/VTE  - Cardiac arrhythmia  - Respiratory Failure/COPD  - Renal failure  - Anemia  - Advanced Liver disease

## 2022-10-31 NOTE — H&P (View-Only) (Signed)
TOTAL KNEE ADMISSION H&P  Patient is being admitted for right total knee arthroplasty.  Subjective:  Chief Complaint:right knee pain.  HPI: Fernando [name], 61 y.o. male, has a history of pain and functional disability in the right knee due to arthritis and has failed non-surgical conservative treatments for greater than 12 weeks to includeNSAID's and/or analgesics, corticosteriod injections, flexibility and strengthening excercises, use of assistive devices, and activity modification.  Onset of symptoms was gradual, starting 10 years ago with rapidlly worsening course since that time. The patient noted no past surgery on the right knee(s).  Patient currently rates pain in the right knee(s) at 10 out of 10 with activity. Patient has night pain, worsening of pain with activity and weight bearing, pain that interferes with activities of daily living, pain with passive range of motion, crepitus, and joint swelling.  Patient has evidence of subchondral cysts, subchondral sclerosis, periarticular osteophytes, and joint space narrowing by imaging studies. There is no active infection.  Patient Active Problem List   Diagnosis Date Noted   Pancreatitis 06/21/2022   Thoracic aortic aneurysm without rupture (HCC) 05/18/2020   Costochondral chest pain 10/12/2016   Hypothyroidism 08/27/2015   Acute respiratory failure with hypoxia (HCC) 08/27/2015   COPD (chronic obstructive pulmonary disease) (HCC) 08/27/2015   Obstructive sleep apnea 08/27/2015   Dyspnea on exertion 08/26/2015   Left-sided chest pain 08/26/2015   Obesity 08/26/2015   Anal fistula 08/26/2015   Pedal edema 08/26/2015   Hypokalemia 08/26/2015   Chronic diarrhea 08/26/2015   Essential hypertension 08/26/2015   Pain in the chest    HCAP (healthcare-associated pneumonia) 05/12/2015   Nodule of left lung 05/03/2015   Lung mass 03/30/2015   Peri-rectal abscess 03/07/2015   Abnormal CXR 03/07/2015   Scrotal wall abscess 05/01/2013    Cellulitis 05/01/2013   Hematuria 04/30/2013   Cellulitis of scrotum 04/29/2013   Tick bite 04/29/2013   Hypertension 12/30/2011   Past Medical History:  Diagnosis Date   Acute respiratory failure with hypoxia (HCC) 08/27/2015   Mild-Oxygen saturation 88% with ambulation.   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   Tobacco abuse    Umbilical hernia    has been repaired    Past Surgical History:  Procedure Laterality Date   ABSCESS DRAINAGE     APPENDECTOMY     BIOPSY  07/24/2022   Procedure: BIOPSY;  Surgeon: Lanelle Bal, DO;  Location: [facility];  Service: Endoscopy;;   CERVICAL FUSION     COLONOSCOPY     CYSTOSCOPY N/A 05/01/2013   Procedure: CYSTOSCOPY;  Surgeon: Ky Barban, MD;  Location: [facility];  Service: Urology;  Laterality: N/A;   ESOPHAGOGASTRODUODENOSCOPY (EGD) WITH PROPOFOL N/A 07/24/2022   Procedure: ESOPHAGOGASTRODUODENOSCOPY (EGD) WITH PROPOFOL;  Surgeon: Lanelle Bal, DO;  Location: [facility];  Service: Endoscopy;  Laterality: N/A;  8:00am, asa 3   EVALUATION UNDER ANESTHESIA WITH FISTULECTOMY N/A 06/23/2015   Procedure: ANAL EXAM UNDER ANESTHESIA  FISTULOTOMY;  Surgeon: Romie Levee, MD;  Location: [facility];  Service: General;  Laterality: N/A;   FACIAL COSMETIC SURGERY     HERNIA REPAIR     INCISION AND DRAINAGE ABSCESS Right 05/01/2013   Procedure: INCISION AND DRAINAGE RIGHT SCROTAL ABSCESS;  Surgeon: Ky Barban,  MD;  Location: [facility];  Service: Urology;  Laterality: Right;   INCISION AND DRAINAGE PERIRECTAL ABSCESS     KNEE ARTHROSCOPY     SPINE SURGERY     cervical   VIDEO ASSISTED THORACOSCOPY (VATS)/ LOBECTOMY Left 05/03/2015   Procedure: LEFT VIDEO ASSISTED THORACOSCOPY (VATS)/ LEFT LOWER LOBECTOMY, ON-Q CATHETER PLACEMENT;  Surgeon: Loreli Slot, MD;  Location: [facility] OR;  Service: Thoracic;  Laterality: Left;   VIDEO BRONCHOSCOPY WITH ENDOBRONCHIAL ULTRASOUND N/A 04/22/2015   Procedure: VIDEO BRONCHOSCOPY WITH ENDOBRONCHIAL ULTRASOUND;  Surgeon: Loreli Slot, MD;  Location: [facility] OR;  Service: Thoracic;  Laterality: N/A;    Current Outpatient Medications  Medication Sig Dispense Refill Last Dose   acetaminophen (TYLENOL) 500 MG tablet Take 1,000 mg by mouth every 6 (six) hours as needed for moderate pain.      ALPRAZolam (XANAX) 1 MG tablet Take 1 mg by mouth in the morning, at noon, and at bedtime.      amLODipine (NORVASC) 5 MG tablet Take 1 tablet (5 mg total) by mouth daily. 30 tablet 0    furosemide (LASIX) 20 MG tablet Take 20 mg by mouth daily as needed for edema or fluid.      ibuprofen (ADVIL) 200 MG tablet Take 400 mg by mouth every 6 (six) hours as needed for moderate pain.      Multiple Vitamin (MULTI VITAMIN PO) Take 1 tablet by mouth daily.      olmesartan (BENICAR) 20 MG tablet Take 20 mg by mouth daily.      oxyCODONE-acetaminophen (PERCOCET/ROXICET) 5-325 MG tablet Take 1 tablet by mouth every 6 (six) hours as needed for severe pain. (Patient not taking: Reported on 10/10/2022) 8 tablet 0    pantoprazole (PROTONIX) 40 MG tablet Take 1 tablet (40 mg total) by mouth 2 (two) times daily. (Patient taking differently: Take 20 mg by mouth 2 (two) times daily.) 60 tablet 11    potassium chloride SA (KLOR-CON M) 20 MEQ tablet Take 20 mEq by mouth daily as needed (cramping).      No current facility-administered medications for this visit.   Allergies  Allergen Reactions    Morphine And Codeine Other (See Comments)    hallucinations    Social History   Tobacco Use   Smoking status: Some Days    Current packs/day: 0.00    Average packs/day: 0.5 packs/day for 15.0 years (7.5 ttl pk-yrs)    Types: Cigarettes    Start date: 03/04/2000    Last attempt to quit: 03/05/2015    Years since quitting: 7.6   Smokeless tobacco: Never  Substance Use Topics   Alcohol use: Yes    Comment: occasionally    Family History  Problem Relation Age of Onset   Breast cancer Mother    Thyroid disease Mother      Review of Systems  Musculoskeletal:  Positive for arthralgias, gait problem and joint swelling.  All other systems reviewed and are negative.   Objective:  Physical Exam Constitutional:  Appearance: Normal appearance.  HENT:     Head: Normocephalic and atraumatic.     Nose: Nose normal.     Mouth/Throat:     Mouth: Mucous membranes are moist.     Pharynx: Oropharynx is clear.  Eyes:     Conjunctiva/sclera: Conjunctivae normal.  Cardiovascular:     Rate and Rhythm: Normal rate and regular rhythm.     Pulses: Normal pulses.     Heart sounds: Normal heart sounds.  Pulmonary:     Effort: Pulmonary effort is normal.     Breath sounds: Normal breath sounds.  Abdominal:     General: Abdomen is flat.     Palpations: Abdomen is soft.  Genitourinary:    Comments: deferred Musculoskeletal:     Cervical back: Normal range of motion and neck supple.     Comments: Examination of right knee reveals no skin wounds or lesions. He has swelling, trace effusion. No warmth or erythema. Tenderness palpation medial joint line, lateral joint line, peripatellar and ocular tissues with a positive grind sign. Range of motion 18 to 110 degrees without any ligamentous instability. No extensor lag. Painless range of motion the hip.  Distally, there is no focal motor or sensory deficit. Is palpable pedal pulse.  Skin:    General: Skin is warm and dry.     Capillary  Refill: Capillary refill takes less than 2 seconds.  Neurological:     General: No focal deficit present.     Mental Status: He is alert and oriented to person, place, and time.  Psychiatric:        Mood and Affect: Mood normal.        Behavior: Behavior normal.        Thought Content: Thought content normal.        Judgment: Judgment normal.     Vital signs in last 24 hours: @VSRANGES @  Labs:   Estimated body mass index is 34.41 kg/m as calculated from the following:   Height as of 10/19/22: 6\' 2"  (1.88 m).   Weight as of 10/19/22: 121.6 kg.   Imaging Review Plain radiographs demonstrate severe degenerative joint disease of the right knee(s). The overall alignment issignificant varus. The bone quality appears to be adequate for age and reported activity level.      Assessment/Plan:  End stage arthritis, right knee   The patient history, physical examination, clinical judgment of the provider and imaging studies are consistent with end stage degenerative joint disease of the right knee(s) and total knee arthroplasty is deemed medically necessary. The treatment options including medical management, injection therapy arthroscopy and arthroplasty were discussed at length. The risks and benefits of total knee arthroplasty were presented and reviewed. The risks due to aseptic loosening, infection, stiffness, patella tracking problems, thromboembolic complications and other imponderables were discussed. The patient acknowledged the explanation, agreed to proceed with the plan and consent was signed. Patient is being admitted for inpatient treatment for surgery, pain control, PT, OT, prophylactic antibiotics, VTE prophylaxis, progressive ambulation and ADL's and discharge planning. The patient is planning to be discharged home with OPPT.    Therapy Plans: outpatient therapy. PT at [facility] in [place] 11/06/22.  Disposition: Home with mother Planned DVT Prophylaxis: aspirin 81mg   BID DME needed: has rolling walker.  PCP: Cleared.  Cardiology: Cleared.  TXA: IV Allergies:  - Morphine - hallucinations.  Anesthesia Concerns: None.  BMI: 36.0 Last HgbA1c: Not diabetic.  Other: - Moderate aortic aneurysm.  - Oxycodone, zofran,  methocarbamol, meloxicam.  - 10/19/22: Hgb 14.8, K+ 3.3, Cr. 1.08.      Patient's anticipated LOS is less than 2 midnights, meeting these requirements: - Younger than 20 - Lives within 1 hour of care - Has a competent adult at home to recover with post-op recover - NO history of  - Chronic pain requiring opiods  - Diabetes  - Coronary Artery Disease  - Heart failure  - Heart attack  - Stroke  - DVT/VTE  - Cardiac arrhythmia  - Respiratory Failure/COPD  - Renal failure  - Anemia  - Advanced Liver disease

## 2022-11-01 ENCOUNTER — Ambulatory Visit (HOSPITAL_COMMUNITY): Payer: Medicaid Other | Admitting: Anesthesiology

## 2022-11-01 ENCOUNTER — Inpatient Hospital Stay (HOSPITAL_COMMUNITY): Payer: Medicaid Other

## 2022-11-01 ENCOUNTER — Other Ambulatory Visit (HOSPITAL_COMMUNITY): Payer: Medicaid Other

## 2022-11-01 ENCOUNTER — Inpatient Hospital Stay (HOSPITAL_COMMUNITY)
Admission: AD | Admit: 2022-11-01 | Discharge: 2022-11-03 | DRG: 469 | Disposition: A | Discharge status: Home / Self Care | Payer: Medicaid Other | Attending: Internal Medicine | Admitting: Internal Medicine

## 2022-11-01 ENCOUNTER — Encounter (HOSPITAL_COMMUNITY): Payer: Self-pay | Admitting: Orthopedic Surgery

## 2022-11-01 ENCOUNTER — Other Ambulatory Visit: Payer: Self-pay

## 2022-11-01 ENCOUNTER — Ambulatory Visit (HOSPITAL_COMMUNITY): Payer: Medicaid Other | Admitting: Medical

## 2022-11-01 ENCOUNTER — Encounter (HOSPITAL_COMMUNITY)
Admission: AD | Disposition: A | Discharge status: Home / Self Care | Payer: Self-pay | Source: Home / Self Care | Attending: Internal Medicine

## 2022-11-01 DIAGNOSIS — G9341 Metabolic encephalopathy: Secondary | ICD-10-CM | POA: Diagnosis not present

## 2022-11-01 DIAGNOSIS — N182 Chronic kidney disease, stage 2 (mild): Secondary | ICD-10-CM | POA: Diagnosis present

## 2022-11-01 DIAGNOSIS — I129 Hypertensive chronic kidney disease with stage 1 through stage 4 chronic kidney disease, or unspecified chronic kidney disease: Secondary | ICD-10-CM | POA: Diagnosis present

## 2022-11-01 DIAGNOSIS — T782XXA Anaphylactic shock, unspecified, initial encounter: Secondary | ICD-10-CM | POA: Diagnosis not present

## 2022-11-01 DIAGNOSIS — R569 Unspecified convulsions: Secondary | ICD-10-CM

## 2022-11-01 DIAGNOSIS — E876 Hypokalemia: Secondary | ICD-10-CM | POA: Diagnosis present

## 2022-11-01 DIAGNOSIS — F13239 Sedative, hypnotic or anxiolytic dependence with withdrawal, unspecified: Secondary | ICD-10-CM | POA: Diagnosis not present

## 2022-11-01 DIAGNOSIS — Z902 Acquired absence of lung [part of]: Secondary | ICD-10-CM | POA: Diagnosis not present

## 2022-11-01 DIAGNOSIS — F419 Anxiety disorder, unspecified: Secondary | ICD-10-CM | POA: Diagnosis present

## 2022-11-01 DIAGNOSIS — R7303 Prediabetes: Secondary | ICD-10-CM | POA: Diagnosis present

## 2022-11-01 DIAGNOSIS — G4733 Obstructive sleep apnea (adult) (pediatric): Secondary | ICD-10-CM | POA: Diagnosis present

## 2022-11-01 DIAGNOSIS — E039 Hypothyroidism, unspecified: Secondary | ICD-10-CM | POA: Diagnosis present

## 2022-11-01 DIAGNOSIS — M1711 Unilateral primary osteoarthritis, right knee: Principal | ICD-10-CM | POA: Diagnosis present

## 2022-11-01 DIAGNOSIS — N179 Acute kidney failure, unspecified: Secondary | ICD-10-CM | POA: Diagnosis not present

## 2022-11-01 DIAGNOSIS — Z634 Disappearance and death of family member: Secondary | ICD-10-CM

## 2022-11-01 DIAGNOSIS — J449 Chronic obstructive pulmonary disease, unspecified: Secondary | ICD-10-CM | POA: Diagnosis present

## 2022-11-01 DIAGNOSIS — J9601 Acute respiratory failure with hypoxia: Secondary | ICD-10-CM | POA: Diagnosis not present

## 2022-11-01 DIAGNOSIS — F17210 Nicotine dependence, cigarettes, uncomplicated: Secondary | ICD-10-CM | POA: Diagnosis present

## 2022-11-01 DIAGNOSIS — Z79899 Other long term (current) drug therapy: Secondary | ICD-10-CM

## 2022-11-01 DIAGNOSIS — Z6836 Body mass index (BMI) 36.0-36.9, adult: Secondary | ICD-10-CM

## 2022-11-01 DIAGNOSIS — E66812 Obesity, class 2: Secondary | ICD-10-CM | POA: Diagnosis present

## 2022-11-01 DIAGNOSIS — Z8349 Family history of other endocrine, nutritional and metabolic diseases: Secondary | ICD-10-CM | POA: Diagnosis not present

## 2022-11-01 DIAGNOSIS — Z803 Family history of malignant neoplasm of breast: Secondary | ICD-10-CM | POA: Diagnosis not present

## 2022-11-01 DIAGNOSIS — Z96651 Presence of right artificial knee joint: Principal | ICD-10-CM

## 2022-11-01 DIAGNOSIS — Z885 Allergy status to narcotic agent status: Secondary | ICD-10-CM

## 2022-11-01 HISTORY — PX: KNEE ARTHROPLASTY: SHX992

## 2022-11-01 LAB — COMPREHENSIVE METABOLIC PANEL
ALT: 20 U/L (ref 0–44)
AST: 20 U/L (ref 15–41)
Albumin: 3.2 g/dL — ABNORMAL LOW (ref 3.5–5.0)
Alkaline Phosphatase: 66 U/L (ref 38–126)
Anion gap: 9 (ref 5–15)
BUN: 13 mg/dL (ref 6–20)
CO2: 23 mmol/L (ref 22–32)
Calcium: 7.6 mg/dL — ABNORMAL LOW (ref 8.9–10.3)
Chloride: 104 mmol/L (ref 98–111)
Creatinine, Ser: 1.02 mg/dL (ref 0.61–1.24)
GFR, Estimated: >60 mL/min (ref >60)
Glucose, Bld: 231 mg/dL — ABNORMAL HIGH (ref 70–99)
Potassium: 2.6 mmol/L — CL (ref 3.5–5.1)
Sodium: 136 mmol/L (ref 135–145)
Total Bilirubin: 0.9 mg/dL (ref 0.3–1.2)
Total Protein: 5.9 g/dL — ABNORMAL LOW (ref 6.5–8.1)

## 2022-11-01 LAB — BLOOD GAS, ARTERIAL
Acid-base deficit: 1.7 mmol/L (ref 0.0–2.0)
Bicarbonate: 23 mmol/L (ref 20.0–28.0)
Drawn by: 331471
FIO2: 100 %
MECHVT: 650 mL
O2 Saturation: 99.9 %
PEEP: 5 cmH2O
Patient temperature: 37
RATE: 25 {breaths}/min
pCO2 arterial: 38 mm[Hg] (ref 32–48)
pH, Arterial: 7.39 (ref 7.35–7.45)
pO2, Arterial: 186 mm[Hg] — ABNORMAL HIGH (ref 83–108)

## 2022-11-01 LAB — GLUCOSE, CAPILLARY
Glucose-Capillary: 228 mg/dL — ABNORMAL HIGH (ref 70–99)
Glucose-Capillary: 270 mg/dL — ABNORMAL HIGH (ref 70–99)
Glucose-Capillary: 327 mg/dL — ABNORMAL HIGH (ref 70–99)

## 2022-11-01 LAB — CBC
HCT: 44.3 % (ref 39.0–52.0)
Hemoglobin: 14.9 g/dL (ref 13.0–17.0)
MCH: 32.2 pg (ref 26.0–34.0)
MCHC: 33.6 g/dL (ref 30.0–36.0)
MCV: 95.7 fL (ref 80.0–100.0)
Platelets: 273 10*3/uL (ref 150–400)
RBC: 4.63 MIL/uL (ref 4.22–5.81)
RDW: 12.8 % (ref 11.5–15.5)
WBC: 17.9 10*3/uL — ABNORMAL HIGH (ref 4.0–10.5)
nRBC: 0 % (ref 0.0–0.2)

## 2022-11-01 LAB — PHOSPHORUS: Phosphorus: 3.4 mg/dL (ref 2.5–4.6)

## 2022-11-01 LAB — POCT I-STAT 7, (LYTES, BLD GAS, ICA,H+H)
Acid-base deficit: 4 mmol/L — ABNORMAL HIGH (ref 0.0–2.0)
Bicarbonate: 28.6 mmol/L — ABNORMAL HIGH (ref 20.0–28.0)
Calcium, Ion: 1.15 mmol/L (ref 1.15–1.40)
HCT: 44 % (ref 39.0–52.0)
Hemoglobin: 15 g/dL (ref 13.0–17.0)
O2 Saturation: 100 %
Patient temperature: 35.7
Potassium: 2.7 mmol/L — CL (ref 3.5–5.1)
Sodium: 142 mmol/L (ref 135–145)
TCO2: 31 mmol/L (ref 22–32)
pCO2 arterial: 90.5 mm[Hg] (ref 32–48)
pH, Arterial: 7.099 — CL (ref 7.35–7.45)
pO2, Arterial: 349 mm[Hg] — ABNORMAL HIGH (ref 83–108)

## 2022-11-01 LAB — MAGNESIUM: Magnesium: 1.5 mg/dL — ABNORMAL LOW (ref 1.7–2.4)

## 2022-11-01 LAB — MRSA NEXT GEN BY PCR, NASAL: MRSA by PCR Next Gen: NOT DETECTED

## 2022-11-01 LAB — LACTIC ACID, PLASMA: Lactic Acid, Venous: 2 mmol/L (ref 0.5–1.9)

## 2022-11-01 SURGERY — ARTHROPLASTY, KNEE, TOTAL, USING IMAGELESS COMPUTER-ASSISTED NAVIGATION
Anesthesia: Spinal | Site: Knee | Laterality: Right

## 2022-11-01 MED ORDER — METHYLPREDNISOLONE SODIUM SUCC 40 MG IJ SOLR
40.0000 mg | Freq: Two times a day (BID) | INTRAMUSCULAR | Status: AC
  Administered 2022-11-01 – 2022-11-03 (×4): 40 mg via INTRAVENOUS
  Filled 2022-11-01 (×4): qty 1

## 2022-11-01 MED ORDER — EPINEPHRINE 1 MG/10ML IJ SOSY
PREFILLED_SYRINGE | INTRAMUSCULAR | Status: DC | PRN
  Administered 2022-11-01 (×9): 20 ug via INTRAVENOUS

## 2022-11-01 MED ORDER — EPINEPHRINE HCL 5 MG/250ML IV SOLN IN NS
0.5000 ug/min | INTRAVENOUS | Status: DC
  Administered 2022-11-01: 2 ug/min via INTRAVENOUS
  Administered 2022-11-01: 12 ug/min via INTRAVENOUS
  Filled 2022-11-01: qty 250

## 2022-11-01 MED ORDER — DEXMEDETOMIDINE HCL IN NACL 200 MCG/50ML IV SOLN
0.0000 ug/kg/h | INTRAVENOUS | Status: DC
Start: 2022-11-01 — End: 2022-11-01
  Administered 2022-11-01: 0.6 ug/kg/h via INTRAVENOUS
  Administered 2022-11-01: 0.4 ug/kg/h via INTRAVENOUS
  Filled 2022-11-01 (×2): qty 50

## 2022-11-01 MED ORDER — FENTANYL CITRATE (PF) 100 MCG/2ML IJ SOLN
INTRAMUSCULAR | Status: AC
  Filled 2022-11-01: qty 2

## 2022-11-01 MED ORDER — FAMOTIDINE IN NACL 20-0.9 MG/50ML-% IV SOLN
20.0000 mg | Freq: Two times a day (BID) | INTRAVENOUS | Status: DC
  Administered 2022-11-01 – 2022-11-02 (×4): 20 mg via INTRAVENOUS
  Filled 2022-11-01 (×4): qty 50

## 2022-11-01 MED ORDER — SODIUM CHLORIDE (PF) 0.9 % IJ SOLN
INTRAMUSCULAR | Status: DC | PRN
  Administered 2022-11-01: 30 mL

## 2022-11-01 MED ORDER — POTASSIUM CHLORIDE 10 MEQ/100ML IV SOLN
10.0000 meq | INTRAVENOUS | Status: DC
  Administered 2022-11-01 (×2): 10 meq via INTRAVENOUS
  Filled 2022-11-01 (×2): qty 100

## 2022-11-01 MED ORDER — POVIDONE-IODINE 10 % EX SWAB
2.0000 | Freq: Once | CUTANEOUS | Status: AC
  Administered 2022-11-01: 2 via TOPICAL

## 2022-11-01 MED ORDER — METHYLPREDNISOLONE SODIUM SUCC 125 MG IJ SOLR
125.0000 mg | Freq: Once | INTRAMUSCULAR | Status: AC
  Administered 2022-11-01: 125 mg via INTRAVENOUS
  Filled 2022-11-01: qty 2

## 2022-11-01 MED ORDER — VASOPRESSIN 20 UNIT/ML IV SOLN
INTRAVENOUS | Status: AC
Start: 2022-11-01 — End: ?
  Filled 2022-11-01: qty 1

## 2022-11-01 MED ORDER — DEXAMETHASONE SODIUM PHOSPHATE 10 MG/ML IJ SOLN
INTRAMUSCULAR | Status: DC | PRN
  Administered 2022-11-01: 8 mg via INTRAVENOUS

## 2022-11-01 MED ORDER — BUPIVACAINE-EPINEPHRINE 0.25% -1:200000 IJ SOLN
INTRAMUSCULAR | Status: AC
  Filled 2022-11-01: qty 1

## 2022-11-01 MED ORDER — MIDAZOLAM HCL 2 MG/2ML IJ SOLN
INTRAMUSCULAR | Status: AC
  Filled 2022-11-01: qty 2

## 2022-11-01 MED ORDER — ONDANSETRON HCL 4 MG PO TABS: 4.0000 mg | ORAL_TABLET | Freq: Four times a day (QID) | ORAL | Status: DC | PRN

## 2022-11-01 MED ORDER — ROCURONIUM BROMIDE 100 MG/10ML IV SOLN
INTRAVENOUS | Status: DC | PRN
  Administered 2022-11-01: 100 mg via INTRAVENOUS

## 2022-11-01 MED ORDER — METOCLOPRAMIDE HCL 5 MG/ML IJ SOLN: 5.0000 mg | Freq: Three times a day (TID) | INTRAMUSCULAR | Status: DC | PRN

## 2022-11-01 MED ORDER — ALBUMIN HUMAN 5 % IV SOLN
INTRAVENOUS | Status: AC
Start: 2022-11-01 — End: ?
  Filled 2022-11-01: qty 250

## 2022-11-01 MED ORDER — GLYCOPYRROLATE 0.2 MG/ML IJ SOLN
INTRAMUSCULAR | Status: DC | PRN
Start: 2022-11-01 — End: 2022-11-01
  Administered 2022-11-01 (×2): .1 mg via INTRAVENOUS

## 2022-11-01 MED ORDER — LACTATED RINGERS IV BOLUS
1000.0000 mL | Freq: Once | INTRAVENOUS | Status: AC
  Administered 2022-11-01: 1000 mL via INTRAVENOUS

## 2022-11-01 MED ORDER — POVIDONE-IODINE 10 % EX SWAB: 2.0000 | Freq: Once | CUTANEOUS | Status: DC

## 2022-11-01 MED ORDER — DIPHENHYDRAMINE HCL 50 MG/ML IJ SOLN
INTRAMUSCULAR | Status: DC | PRN
  Administered 2022-11-01: 50 mg via INTRAVENOUS

## 2022-11-01 MED ORDER — FENTANYL CITRATE (PF) 100 MCG/2ML IJ SOLN
INTRAMUSCULAR | Status: DC | PRN
  Administered 2022-11-01 (×4): 50 ug via INTRAVENOUS

## 2022-11-01 MED ORDER — MIDAZOLAM HCL 5 MG/5ML IJ SOLN
INTRAMUSCULAR | Status: DC | PRN
  Administered 2022-11-01: 2 mg via INTRAVENOUS
  Administered 2022-11-01 (×2): 1 mg via INTRAVENOUS

## 2022-11-01 MED ORDER — BUPIVACAINE-EPINEPHRINE 0.25% -1:200000 IJ SOLN
INTRAMUSCULAR | Status: DC | PRN
  Administered 2022-11-01: 30 mL

## 2022-11-01 MED ORDER — METHYLPREDNISOLONE SODIUM SUCC 40 MG IJ SOLR: 40.0000 mg | Freq: Two times a day (BID) | INTRAMUSCULAR | Status: DC

## 2022-11-01 MED ORDER — POLYETHYLENE GLYCOL 3350 17 G PO PACK
17.0000 g | PACK | Freq: Every day | ORAL | Status: DC
Start: 2022-11-02 — End: 2022-11-02

## 2022-11-01 MED ORDER — ASPIRIN 81 MG PO CHEW: 81.0000 mg | CHEWABLE_TABLET | Freq: Two times a day (BID) | ORAL | 0 refills | Status: AC

## 2022-11-01 MED ORDER — ACETAMINOPHEN 500 MG PO TABS
1000.0000 mg | ORAL_TABLET | Freq: Once | ORAL | Status: AC
  Administered 2022-11-01: 1000 mg via ORAL
  Filled 2022-11-01: qty 2

## 2022-11-01 MED ORDER — METHOCARBAMOL 500 MG PO TABS: 500.0000 mg | ORAL_TABLET | Freq: Four times a day (QID) | ORAL | 0 refills | Status: AC | PRN

## 2022-11-01 MED ORDER — DEXMEDETOMIDINE HCL IN NACL 400 MCG/100ML IV SOLN
0.0000 ug/kg/h | INTRAVENOUS | Status: DC
  Administered 2022-11-01: 1 ug/kg/h via INTRAVENOUS
  Administered 2022-11-01: 0.8 ug/kg/h via INTRAVENOUS
  Administered 2022-11-02: 1 ug/kg/h via INTRAVENOUS
  Administered 2022-11-02: 0.7 ug/kg/h via INTRAVENOUS
  Filled 2022-11-01 (×4): qty 100

## 2022-11-01 MED ORDER — PROPOFOL 500 MG/50ML IV EMUL
INTRAVENOUS | Status: DC | PRN
  Administered 2022-11-01: 30 ug/kg/min via INTRAVENOUS

## 2022-11-01 MED ORDER — DOCUSATE SODIUM 100 MG PO CAPS: 100.0000 mg | ORAL_CAPSULE | Freq: Two times a day (BID) | ORAL | 0 refills | Status: AC

## 2022-11-01 MED ORDER — TRANEXAMIC ACID-NACL 1000-0.7 MG/100ML-% IV SOLN
1000.0000 mg | INTRAVENOUS | Status: AC
  Administered 2022-11-01: 1000 mg via INTRAVENOUS
  Filled 2022-11-01: qty 100

## 2022-11-01 MED ORDER — CEFAZOLIN IN SODIUM CHLORIDE 3-0.9 GM/100ML-% IV SOLN
3.0000 g | INTRAVENOUS | Status: AC
  Administered 2022-11-01: 3 g via INTRAVENOUS
  Filled 2022-11-01: qty 100

## 2022-11-01 MED ORDER — SODIUM CHLORIDE 0.9 % IR SOLN
Status: DC | PRN
  Administered 2022-11-01 (×2): 1000 mL

## 2022-11-01 MED ORDER — DOCUSATE SODIUM 50 MG/5ML PO LIQD
100.0000 mg | Freq: Two times a day (BID) | ORAL | Status: DC
  Filled 2022-11-01: qty 10

## 2022-11-01 MED ORDER — METOCLOPRAMIDE HCL 5 MG PO TABS: 5.0000 mg | ORAL_TABLET | Freq: Three times a day (TID) | ORAL | Status: DC | PRN

## 2022-11-01 MED ORDER — EPHEDRINE 5 MG/ML INJ
INTRAVENOUS | Status: AC
Start: 2022-11-01 — End: ?
  Filled 2022-11-01: qty 5

## 2022-11-01 MED ORDER — ONDANSETRON HCL 4 MG PO TABS: 4.0000 mg | ORAL_TABLET | Freq: Three times a day (TID) | ORAL | 0 refills | Status: AC | PRN

## 2022-11-01 MED ORDER — ASPIRIN 81 MG PO CHEW
81.0000 mg | CHEWABLE_TABLET | Freq: Two times a day (BID) | ORAL | Status: DC
  Filled 2022-11-01: qty 1

## 2022-11-01 MED ORDER — OXYCODONE HCL 5 MG PO TABS: 5.0000 mg | ORAL_TABLET | ORAL | 0 refills | Status: AC | PRN

## 2022-11-01 MED ORDER — ORAL CARE MOUTH RINSE: 15.0000 mL | OROMUCOSAL | Status: DC | PRN

## 2022-11-01 MED ORDER — SODIUM CHLORIDE 0.9% IV SOLUTION
INTRAVENOUS | Status: AC | PRN
  Administered 2022-11-01: 500 mL

## 2022-11-01 MED ORDER — LACTATED RINGERS IV SOLN: INTRAVENOUS | Status: DC

## 2022-11-01 MED ORDER — MAGNESIUM SULFATE 2 GM/50ML IV SOLN
2.0000 g | Freq: Once | INTRAVENOUS | Status: AC
  Administered 2022-11-01: 2 g via INTRAVENOUS
  Filled 2022-11-01: qty 50

## 2022-11-01 MED ORDER — MIDAZOLAM HCL 2 MG/2ML IJ SOLN
1.0000 mg | INTRAMUSCULAR | Status: DC | PRN
  Administered 2022-11-01 (×3): 2 mg via INTRAVENOUS
  Filled 2022-11-01 (×3): qty 2

## 2022-11-01 MED ORDER — ONDANSETRON HCL 4 MG/2ML IJ SOLN
INTRAMUSCULAR | Status: DC | PRN
  Administered 2022-11-01: 4 mg via INTRAVENOUS

## 2022-11-01 MED ORDER — FENTANYL CITRATE PF 50 MCG/ML IJ SOSY
50.0000 ug | PREFILLED_SYRINGE | INTRAMUSCULAR | Status: DC | PRN
  Administered 2022-11-01 (×4): 100 ug via INTRAVENOUS
  Filled 2022-11-01: qty 4
  Filled 2022-11-01 (×2): qty 2
  Filled 2022-11-01: qty 1
  Filled 2022-11-01: qty 2

## 2022-11-01 MED ORDER — FENTANYL CITRATE PF 50 MCG/ML IJ SOSY
50.0000 ug | PREFILLED_SYRINGE | INTRAMUSCULAR | Status: AC | PRN
  Administered 2022-11-01 – 2022-11-02 (×3): 50 ug via INTRAVENOUS
  Filled 2022-11-01 (×2): qty 1

## 2022-11-01 MED ORDER — BUPIVACAINE IN DEXTROSE 0.75-8.25 % IT SOLN
INTRATHECAL | Status: DC | PRN
  Administered 2022-11-01: 2 mL via INTRATHECAL

## 2022-11-01 MED ORDER — KETOROLAC TROMETHAMINE 30 MG/ML IJ SOLN
INTRAMUSCULAR | Status: DC | PRN
  Administered 2022-11-01: 30 mg

## 2022-11-01 MED ORDER — ROPIVACAINE HCL 5 MG/ML IJ SOLN
INTRAMUSCULAR | Status: DC | PRN
  Administered 2022-11-01: 20 mL via PERINEURAL

## 2022-11-01 MED ORDER — POTASSIUM CHLORIDE 10 MEQ/100ML IV SOLN
10.0000 meq | INTRAVENOUS | Status: AC
  Administered 2022-11-01 – 2022-11-02 (×4): 10 meq via INTRAVENOUS
  Filled 2022-11-01 (×4): qty 100

## 2022-11-01 MED ORDER — FAMOTIDINE 20 MG PO TABS: 20.0000 mg | ORAL_TABLET | Freq: Two times a day (BID) | ORAL | Status: DC

## 2022-11-01 MED ORDER — ALBUTEROL SULFATE HFA 108 (90 BASE) MCG/ACT IN AERS
INHALATION_SPRAY | RESPIRATORY_TRACT | Status: AC
  Filled 2022-11-01: qty 6.7

## 2022-11-01 MED ORDER — INSULIN ASPART 100 UNIT/ML IJ SOLN
0.0000 [IU] | INTRAMUSCULAR | Status: DC
  Administered 2022-11-01: 11 [IU] via SUBCUTANEOUS
  Administered 2022-11-01: 8 [IU] via SUBCUTANEOUS
  Administered 2022-11-01: 5 [IU] via SUBCUTANEOUS
  Administered 2022-11-02: 11 [IU] via SUBCUTANEOUS
  Administered 2022-11-02: 5 [IU] via SUBCUTANEOUS
  Administered 2022-11-02: 3 [IU] via SUBCUTANEOUS
  Administered 2022-11-03: 2 [IU] via SUBCUTANEOUS

## 2022-11-01 MED ORDER — EPHEDRINE SULFATE (PRESSORS) 50 MG/ML IJ SOLN
INTRAMUSCULAR | Status: DC | PRN
  Administered 2022-11-01 (×2): 2.5 mg via INTRAVENOUS

## 2022-11-01 MED ORDER — PHENYLEPHRINE HCL-NACL 20-0.9 MG/250ML-% IV SOLN
INTRAVENOUS | Status: DC | PRN
  Administered 2022-11-01: 20 ug/min via INTRAVENOUS
  Administered 2022-11-01: 200 ug via INTRAVENOUS
  Administered 2022-11-01 (×3): 80 ug via INTRAVENOUS

## 2022-11-01 MED ORDER — KETOROLAC TROMETHAMINE 30 MG/ML IJ SOLN
INTRAMUSCULAR | Status: AC
  Filled 2022-11-01: qty 1

## 2022-11-01 MED ORDER — PROPOFOL 1000 MG/100ML IV EMUL
INTRAVENOUS | Status: AC
  Filled 2022-11-01: qty 100

## 2022-11-01 MED ORDER — VASOPRESSIN 20 UNITS/100 ML INFUSION FOR SHOCK
0.0000 [IU]/min | INTRAVENOUS | Status: DC
  Administered 2022-11-01: 0.03 [IU]/min via INTRAVENOUS
  Filled 2022-11-01 (×2): qty 100

## 2022-11-01 MED ORDER — VASOPRESSIN 20 UNIT/ML IV SOLN
INTRAVENOUS | Status: DC | PRN
  Administered 2022-11-01 (×2): 2 [IU] via INTRAVENOUS
  Administered 2022-11-01: 3 [IU] via INTRAVENOUS
  Administered 2022-11-01 (×2): 2 [IU] via INTRAVENOUS
  Administered 2022-11-01: 3 [IU] via INTRAVENOUS
  Administered 2022-11-01: 2 [IU] via INTRAVENOUS

## 2022-11-01 MED ORDER — SENNA 8.6 MG PO TABS
2.0000 | ORAL_TABLET | Freq: Every day | ORAL | 0 refills | Status: AC
Start: 2022-11-01 — End: 2022-11-16

## 2022-11-01 MED ORDER — CHLORHEXIDINE GLUCONATE 0.12 % MT SOLN
15.0000 mL | Freq: Once | OROMUCOSAL | Status: AC
  Administered 2022-11-01: 15 mL via OROMUCOSAL

## 2022-11-01 MED ORDER — PHENYLEPHRINE 80 MCG/ML (10ML) SYRINGE FOR IV PUSH (FOR BLOOD PRESSURE SUPPORT)
PREFILLED_SYRINGE | INTRAVENOUS | Status: AC
Start: 2022-11-01 — End: ?
  Filled 2022-11-01: qty 10

## 2022-11-01 MED ORDER — ENOXAPARIN SODIUM 40 MG/0.4ML IJ SOSY
40.0000 mg | PREFILLED_SYRINGE | Freq: Every day | INTRAMUSCULAR | Status: DC
  Administered 2022-11-02 – 2022-11-03 (×2): 40 mg via SUBCUTANEOUS
  Filled 2022-11-01 (×2): qty 0.4

## 2022-11-01 MED ORDER — ORAL CARE MOUTH RINSE
15.0000 mL | OROMUCOSAL | Status: DC
Start: 2022-11-01 — End: 2022-11-02
  Administered 2022-11-01 – 2022-11-02 (×11): 15 mL via OROMUCOSAL

## 2022-11-01 MED ORDER — POLYETHYLENE GLYCOL 3350 17 G PO PACK: 17.0000 g | PACK | Freq: Every day | ORAL | 0 refills | Status: AC | PRN

## 2022-11-01 MED ORDER — ORAL CARE MOUTH RINSE: 15.0000 mL | Freq: Once | OROMUCOSAL | Status: AC

## 2022-11-01 MED ORDER — ISOPROPYL ALCOHOL 70 % SOLN
Status: DC | PRN
  Administered 2022-11-01: 1 via TOPICAL

## 2022-11-01 MED ORDER — SODIUM CHLORIDE (PF) 0.9 % IJ SOLN
INTRAMUSCULAR | Status: AC
  Filled 2022-11-01: qty 50

## 2022-11-01 MED ORDER — ONDANSETRON HCL 4 MG/2ML IJ SOLN
4.0000 mg | Freq: Four times a day (QID) | INTRAMUSCULAR | Status: DC | PRN
  Administered 2022-11-01: 4 mg via INTRAVENOUS
  Filled 2022-11-01: qty 2

## 2022-11-01 MED ORDER — ALBUMIN HUMAN 5 % IV SOLN
INTRAVENOUS | Status: DC | PRN
Start: 2022-11-01 — End: 2022-11-01

## 2022-11-01 MED ORDER — PROPOFOL 10 MG/ML IV BOLUS
INTRAVENOUS | Status: AC
  Filled 2022-11-01: qty 20

## 2022-11-01 MED ORDER — POTASSIUM CHLORIDE 20 MEQ PO PACK
40.0000 meq | PACK | Freq: Once | ORAL | Status: AC
  Administered 2022-11-01: 40 meq
  Filled 2022-11-01: qty 2

## 2022-11-01 MED ORDER — CHLORHEXIDINE GLUCONATE CLOTH 2 % EX PADS
6.0000 | MEDICATED_PAD | Freq: Every day | CUTANEOUS | Status: DC
Start: 2022-11-01 — End: 2022-11-03
  Administered 2022-11-01 – 2022-11-02 (×2): 6 via TOPICAL

## 2022-11-01 MED ORDER — SUCCINYLCHOLINE CHLORIDE 200 MG/10ML IV SOSY
PREFILLED_SYRINGE | INTRAVENOUS | Status: DC | PRN
  Administered 2022-11-01: 100 mg via INTRAVENOUS

## 2022-11-01 SURGICAL SUPPLY — 73 items
ADH SKN CLS APL DERMABOND .7 (GAUZE/BANDAGES/DRESSINGS) ×2
APL PRP STRL LF DISP 70% ISPRP (MISCELLANEOUS) ×2
BAG COUNTER SPONGE SURGICOUNT (BAG) IMPLANT
BAG SPEC THK2 15X12 ZIP CLS (MISCELLANEOUS)
BAG SPNG CNTER NS LX DISP (BAG)
BAG ZIPLOCK 12X15 (MISCELLANEOUS) IMPLANT
BATTERY INSTRU NAVIGATION (MISCELLANEOUS) ×3 IMPLANT
BLADE SAW RECIPROCATING 77.5 (BLADE) ×2 IMPLANT
BNDG CMPR 5X4 KNIT ELC UNQ LF (GAUZE/BANDAGES/DRESSINGS) ×1
BNDG CMPR 6 X 5 YARDS HK CLSR (GAUZE/BANDAGES/DRESSINGS) ×1
BNDG CMPR MD 5X2 ELC HKLP STRL (GAUZE/BANDAGES/DRESSINGS) ×1
BNDG CMPR MED 10X6 ELC LF (GAUZE/BANDAGES/DRESSINGS) ×1
BNDG ELASTIC 2 VLCR STRL LF (GAUZE/BANDAGES/DRESSINGS) IMPLANT
BNDG ELASTIC 4INX 5YD STR LF (GAUZE/BANDAGES/DRESSINGS) ×1 IMPLANT
BNDG ELASTIC 6INX 5YD STR LF (GAUZE/BANDAGES/DRESSINGS) ×1 IMPLANT
BNDG ELASTIC 6X10 VLCR STRL LF (GAUZE/BANDAGES/DRESSINGS) IMPLANT
BTRY SRG DRVR LF (MISCELLANEOUS) ×3
CHLORAPREP W/TINT 26 (MISCELLANEOUS) ×2 IMPLANT
COMP FEM CMT PS STD 11 RT (Joint) ×1 IMPLANT
COMP PATELLA 3 PEG 41 (Joint) ×1 IMPLANT
COMP TIB PS KNEE 0D H RT (Joint) ×1 IMPLANT
COMPONENT FEM CMT PS STD 11 RT (Joint) IMPLANT
COMPONENT PATELLA 3 PEG 41 (Joint) IMPLANT
COMPONET TIB PS KNEE 0D H RT (Joint) IMPLANT
COVER SURGICAL LIGHT HANDLE (MISCELLANEOUS) ×2 IMPLANT
DERMABOND ADVANCED .7 DNX12 (GAUZE/BANDAGES/DRESSINGS) ×2 IMPLANT
DRAPE SHEET LG 3/4 BI-LAMINATE (DRAPES) ×6 IMPLANT
DRAPE U-SHAPE 47X51 STRL (DRAPES) ×1 IMPLANT
DRSG AQUACEL AG ADV 3.5X10 (GAUZE/BANDAGES/DRESSINGS) ×2 IMPLANT
ELECT BLADE TIP CTD 4 INCH (ELECTRODE) ×2 IMPLANT
ELECT REM PT RETURN 15FT ADLT (MISCELLANEOUS) ×1 IMPLANT
GAUZE SPONGE 4X4 12PLY STRL (GAUZE/BANDAGES/DRESSINGS) ×1 IMPLANT
GLOVE BIO SURGEON STRL SZ7 (GLOVE) ×2 IMPLANT
GLOVE BIO SURGEON STRL SZ8.5 (GLOVE) ×2 IMPLANT
GLOVE BIOGEL PI IND STRL 7.5 (GLOVE) ×1 IMPLANT
GLOVE BIOGEL PI IND STRL 8.5 (GLOVE) ×1 IMPLANT
GOWN SPEC L3 XXLG W/TWL (GOWN DISPOSABLE) ×2 IMPLANT
GOWN STRL REUS W/ TWL XL LVL3 (GOWN DISPOSABLE) ×1 IMPLANT
GOWN STRL REUS W/TWL XL LVL3 (GOWN DISPOSABLE) ×1
HANDPIECE INTERPULSE COAX TIP (DISPOSABLE) ×1
HOLDER FOLEY CATH W/STRAP (MISCELLANEOUS) ×1 IMPLANT
HOOD PEEL AWAY T7 (MISCELLANEOUS) ×6 IMPLANT
INSERT TIB ASF PS 7-12 10 GH (Insert) IMPLANT
KIT TURNOVER KIT A (KITS) IMPLANT
MARKER SKIN DUAL TIP RULER LAB (MISCELLANEOUS) ×1 IMPLANT
NDL SAFETY ECLIPSE 18X1.5 (NEEDLE) ×1 IMPLANT
NDL SPNL 18GX3.5 QUINCKE PK (NEEDLE) ×2 IMPLANT
NEEDLE SPNL 18GX3.5 QUINCKE PK (NEEDLE) ×1
NS IRRIG 1000ML POUR BTL (IV SOLUTION) ×2 IMPLANT
PACK TOTAL KNEE CUSTOM (KITS) ×1 IMPLANT
PADDING CAST COTTON 6X4 STRL (CAST SUPPLIES) ×1 IMPLANT
PROTECTOR NERVE ULNAR (MISCELLANEOUS) ×1 IMPLANT
SAW OSC TIP CART 19.5X105X1.3 (SAW) ×2 IMPLANT
SEALER BIPOLAR AQUA 6.0 (INSTRUMENTS) ×1 IMPLANT
SET HNDPC FAN SPRY TIP SCT (DISPOSABLE) ×2 IMPLANT
SET PAD KNEE POSITIONER (MISCELLANEOUS) ×2 IMPLANT
SOLUTION PRONTOSAN WOUND 350ML (IRRIGATION / IRRIGATOR) IMPLANT
SPIKE FLUID TRANSFER (MISCELLANEOUS) ×2 IMPLANT
STAPLER VISISTAT 35W (STAPLE) IMPLANT
SUT MNCRL AB 3-0 PS2 18 (SUTURE) ×1 IMPLANT
SUT MON AB 2-0 CT1 36 (SUTURE) ×1 IMPLANT
SUT STRATAFIX PDO 1 14 VIOLET (SUTURE) ×1
SUT STRATFX PDO 1 14 VIOLET (SUTURE) ×1
SUT VIC AB 1 CTX 36 (SUTURE) ×2
SUT VIC AB 1 CTX36XBRD ANBCTR (SUTURE) ×4 IMPLANT
SUT VIC AB 2-0 CT1 27 (SUTURE) ×1
SUT VIC AB 2-0 CT1 TAPERPNT 27 (SUTURE) ×1 IMPLANT
SUTURE STRATFX PDO 1 14 VIOLET (SUTURE) ×2 IMPLANT
SYR 3ML LL SCALE MARK (SYRINGE) ×1 IMPLANT
TRAY FOLEY MTR SLVR 16FR STAT (SET/KITS/TRAYS/PACK) IMPLANT
TUBE SUCTION HIGH CAP CLEAR NV (SUCTIONS) ×1 IMPLANT
WATER STERILE IRR 1000ML POUR (IV SOLUTION) ×4 IMPLANT
WRAP KNEE MAXI GEL POST OP (GAUZE/BANDAGES/DRESSINGS) IMPLANT

## 2022-11-01 NOTE — Progress Notes (Signed)
PT Cancellation Note  Patient Details Name: Fernando [name] MRN: [id number] DOB: 1960-04-24   Cancelled Treatment:    Reason Eval/Treat Not Completed: Medical issues which prohibited therapy. Pt is currently in ICU on vent and not medically stable to participate with PT following R TKA 11/01/2022.   [name], PT Acute Rehab   Jacqualyn Posey 11/01/2022, 4:18 PM

## 2022-11-01 NOTE — Progress Notes (Addendum)
Upon patient's arrival to ICU unit, pt was displaying symptoms concerning for seizure. Patient was jerking/twitching LUE and head intermittently. Ceribell was initiated by this RN  at 1127 per [place]'s orders. Dr Amada Jupiter notified by CCM to monitor activity.   At this time, patient is following commands and is not displaying any involuntary jerking/twitching movements. Per Neurologist, patient was negative for seizures, and Ceribell can be discontinued at this time. RN will continue to closely monitor patient for changes in neurological status.

## 2022-11-01 NOTE — Anesthesia Postprocedure Evaluation (Signed)
Anesthesia Post Note  Patient: Fernando [name]  Procedure(s) Performed: COMPUTER ASSISTED TOTAL KNEE ARTHROPLASTY (Right: Knee)     Patient location during evaluation: ICU Anesthesia Type: Spinal and General Level of consciousness: patient remains intubated per anesthesia plan Pain management: pain level controlled Vital Signs Assessment: post-procedure vital signs reviewed and stable Respiratory status: patient on ventilator - see flowsheet for VS Cardiovascular status: blood pressure returned to baseline and stable Postop Assessment: no apparent nausea or vomiting Anesthetic complications: no Comments: Probable anaphylactic reaction to unknown trigger.  Patient intubated during case and requiring pressor support.  Pt transported to ICU   No notable events documented.  Last Vitals:  Vitals:   11/01/22 0626 11/01/22 1033  BP: 126/87   Pulse: 65   Resp: 12 (!) 25  Temp: 36.7 C 36.6 C  SpO2: 95%     Last Pain:  Vitals:   11/01/22 1033  TempSrc: Axillary  PainSc:                  Lowella Curb

## 2022-11-01 NOTE — Op Note (Signed)
OPERATIVE REPORT  SURGEON: Samson Frederic, MD   ASSISTANT: Clint Bolder, PA-C  PREOPERATIVE DIAGNOSIS: Primary Right knee arthritis.   POSTOPERATIVE DIAGNOSIS: Primary Right knee arthritis.   PROCEDURE: Computer assisted Right total knee arthroplasty.   IMPLANTS: Zimmer Persona PPS Cementless CR femur, size 11. Persona 0 degree Spiked Keel OsseoTi Tibia, size H. Vivacit-E polyethelyene insert, size 10 mm, CR. OsseoTi 3-Peg patella, size 41 mm.  ANESTHESIA:  MAC, General, Regional, and Spinal  TOURNIQUET TIME: Not utilized.   ESTIMATED BLOOD LOSS:-100 mL    ANTIBIOTICS: 2g Ancef.  DRAINS: None.  COMPLICATIONS: Hypotension and desaturation suddenly during the case consistent with anaphylactic reaction.   CONDITION: ICU - intubated and hemodynamically stable.   BRIEF CLINICAL NOTE: Fernando [name] is a 62 y.o. male with a long-standing history of Right knee arthritis. After failing conservative management, the patient was indicated for total knee arthroplasty. The risks, benefits, and alternatives to the procedure were explained, and the patient elected to proceed.  PROCEDURE IN DETAIL: Adductor canal block was obtained in the pre-op holding area. Once inside the operative room, spinal anesthesia was obtained, and a foley catheter was inserted. The patient was then positioned and the lower extremity was prepped and draped in the normal sterile surgical fashion.  A time-out was called verifying side and site of surgery. The patient received IV antibiotics within 60 minutes of beginning the procedure. A tourniquet was not utilized.   An anterior approach to the knee was performed utilizing a midvastus arthrotomy. A medial release was performed and the patellar fat pad was excised. Stryker imageless navigation was used to cut the distal femur perpendicular to the mechanical axis. A freehand patellar resection was performed, and the patella was sized an prepared with 3 lug  holes.  Nagivation was used to make a neutral proximal tibia resection, taking 9 mm of bone from the less affected lateral side with 3 degrees of slope. The menisci were excised. A spacer block was placed, and the alignment and balance in extension were confirmed.   The distal femur was sized using the 3-degree external rotation guide referencing the posterior femoral cortex. The appropriate 4-in-1 cutting block was pinned into place. Rotation was checked using Whiteside's line, the epicondylar axis, and then confirmed with a spacer block in flexion. The remaining femoral cuts were performed, taking care to protect the MCL.  The tibia was sized and the trial tray was pinned into place. The remaining trail components were inserted. The knee was stable to varus and valgus stress through a full range of motion. The patella tracked centrally, and the PCL was well balanced. The trial components were removed, and the proximal tibial surface was prepared. Final components were impacted into place. The knee was tested for a final time and found to be well balanced.   The wound was copiously irrigated with Prontosan solution and normal saline using pulse lavage.  Marcaine solution was injected into the periarticular soft tissue.  The wound was closed in layers using #1 Vicryl and Stratafix for the fascia, 2-0 Vicryl for the subcutaneous fat, 2-0 Monocryl for the deep dermal layer, and staples + Dermabond for the skin.  Once the glue was fully dried, an Aquacell Ag and compressive dressing were applied.  The patient was transported to the recovery room in stable condition.  Sponge, needle, and instrument counts were correct at the end of the case x2.  The patient tolerated the procedure well and there were no known complications.  About  half way through the procedure, the patient suddenly desaturated (75-80's%) and became hypotensive (SBP mid to high 50s). Anesthesia immediately intubated him. He was given  epinephrine and an arterial line was started. His vitals normalized, and he was taken to the ICU in hemodynamically stable condition. He was noted to have a generalized red rash, sparing the head. No urticaria noted.  The aquamantis was utilized for this case to help facilitate better hemostasis as patient was felt to be at increased risk of bleeding because of complex case requiring increased OR time and/or exposure.  -minimally invasive approach.  A oscillating saw tip was utilized for this case to prevent damage to the soft tissue structures such as muscles, ligaments and tendons, and to ensure accurate bone cuts. This patient was at increased risk for above structures due to  minimally invasive approach.  Please note that a surgical assistant was a medical necessity for this procedure in order to perform it in a safe and expeditious manner. Surgical assistant was necessary to retract the ligaments and vital neurovascular structures to prevent injury to them and also necessary for proper positioning of the limb to allow for anatomic placement of the prosthesis.

## 2022-11-01 NOTE — Anesthesia Procedure Notes (Signed)
Anesthesia Regional Block: Adductor canal block   Pre-Anesthetic Checklist: , timeout performed,  Correct Patient, Correct Site, Correct Laterality,  Correct Procedure, Correct Position, site marked,  Risks and benefits discussed,  Surgical consent,  Pre-op evaluation,  At surgeon's request and post-op pain management  Laterality: Right  Prep: chloraprep       Needles:  Injection technique: Single-shot  Needle Type: Stimiplex     Needle Length: 9cm  Needle Gauge: 21     Additional Needles:   Procedures:,,,, ultrasound used (permanent image in chart),,    Narrative:  Start time: 11/01/2022 7:00 AM End time: 11/01/2022 7:05 AM Injection made incrementally with aspirations every 5 mL.  Performed by: Personally  Anesthesiologist: Lowella Curb, MD

## 2022-11-01 NOTE — Discharge Instructions (Signed)

## 2022-11-01 NOTE — Interval H&P Note (Signed)
History and Physical Interval Note:  11/01/2022 7:19 AM  Fernando [name]  has presented today for surgery, with the diagnosis of Right knee osteoarthritis.  The various methods of treatment have been discussed with the patient and family. After consideration of risks, benefits and other options for treatment, the patient has consented to  Procedure(s) with comments: COMPUTER ASSISTED TOTAL KNEE ARTHROPLASTY (Right) - 160 flip room as a surgical intervention.  The patient's history has been reviewed, patient examined, no change in status, stable for surgery.  I have reviewed the patient's chart and labs.  Questions were answered to the patient's satisfaction.     Dillion Kuethe Akari Defelice

## 2022-11-01 NOTE — Procedures (Signed)
Central Venous Catheter Insertion Procedure Note  Fernando [name]  [id number]  December 30, 1960  Date:11/01/22  Time:3:53 PM   Provider Performing:Fraidy Mccarrick Lacretia Nicks Mikey Bussing   Procedure: Insertion of Non-tunneled Central Venous [phone]) with US guidance ([id number])   Indication(s) Medication administration  Consent Risks of the procedure as well as the alternatives and risks of each were explained to the patient and/or caregiver.  Consent for the procedure was obtained and is signed in the bedside chart  Anesthesia Topical only with 1% lidocaine   Timeout Verified patient identification, verified procedure, site/side was marked, verified correct patient position, special equipment/implants available, medications/allergies/relevant history reviewed, required imaging and test results available.  Sterile Technique Maximal sterile technique including full sterile barrier drape, hand hygiene, sterile gown, sterile gloves, mask, hair covering, sterile ultrasound probe cover (if used).  Procedure Description Area of catheter insertion was cleaned with chlorhexidine and draped in sterile fashion.  With real-time ultrasound guidance a central venous catheter was placed into the left internal jugular vein. Nonpulsatile blood flow and easy flushing noted in all ports.  The catheter was sutured in place and sterile dressing applied.  Complications/Tolerance None; patient tolerated the procedure well. Chest X-ray is ordered to verify placement for internal jugular or subclavian cannulation.   Chest x-ray is not ordered for femoral cannulation.  EBL Minimal  Specimen(s) None    Joneen Roach, AGACNP-BC [facility]  See Amion for personal pager PCCM on call pager [phone] until 7pm. Please call Elink 7p-7a. [phone]  11/01/2022 3:53 PM

## 2022-11-01 NOTE — Plan of Care (Signed)
[facility] Tonsillectomy/Adenoidectomy, Postoperative PEDS care plan entered in error.

## 2022-11-01 NOTE — Progress Notes (Signed)
Pt received from OR intubated and with a-line.

## 2022-11-01 NOTE — Progress Notes (Signed)
PHARMACY - ANTICOAGULATION CONSULT NOTE  Pharmacy Consult for Lovenox Indication: VTE prophylaxis  Allergies  Allergen Reactions   Ancef [Cefazolin] Anaphylaxis   Morphine And Codeine Other (See Comments)    hallucinations    Patient Measurements: Height: 6\' 2"  (188 cm) Weight: 123.9 kg (273 lb 2.4 oz) IBW/kg (Calculated) : 82.2  Vital Signs: Temp: 97.8 F (36.6 C) (10/23 1033) Temp Source: Axillary (10/23 1033) BP: 106/76 (10/23 1100) Pulse Rate: 79 (10/23 1208)  Labs: Recent Labs    11/01/22 1007 11/01/22 1144  HGB 15.0 14.9  HCT 44.0 44.3  PLT  --  273    Estimated Creatinine Clearance: 103.7 mL/min (by C-G formula based on SCr of 1.06 mg/dL).   Medical History: Past Medical History:  Diagnosis Date   Acute respiratory failure with hypoxia (HCC) 08/27/2015   Mild-Oxygen saturation 88% with ambulation.   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  LMWH per Rx for VTE prophx: 40mg /d to start POD1 (note BMI>30) but ortho does not use 0.5mg /kg/d for obesity and likes low-dose anticoag for prophx immediately post-op due to high risk of bleeding.  Goal of Therapy:  VTE prophx. Monitor platelets by anticoagulation protocol: Yes   Plan:  Lovenox 40mg  daily to start POD1. Pharmacy will sign off. Please reconsult  for further dosing assitance. Ortho can make recommendations/changes as patient recovers for their more typical post-op DVT prophx such as ASA vs DOAC.   Kalena Mander S. Merilynn Finland, PharmD, BCPS Clinical Staff Pharmacist [email or web address]  Goebel Hellums S. Merilynn Finland, PharmD, BCPS Clinical Staff Pharmacist [email or web address] Merilynn Finland, [employer] 11/01/2022,12:34 PM

## 2022-11-01 NOTE — Transfer of Care (Signed)
Immediate Anesthesia Transfer of Care Note  Patient: Fernando [name]  Procedure(s) Performed: COMPUTER ASSISTED TOTAL KNEE ARTHROPLASTY (Right: Knee)  Patient Location: ICU  Anesthesia Type:General  Level of Consciousness: sedated and Patient remains intubated per anesthesia plan  Airway & Oxygen Therapy: Patient remains intubated per anesthesia plan  Post-op Assessment: Report given to RN and Post -op Vital signs reviewed and stable  Post vital signs: Reviewed and stable  Last Vitals:  Vitals Value Taken Time  BP 113/80 11/01/22 1020  Temp    Pulse 79 11/01/22 1025  Resp 25 11/01/22 1027  SpO2 92 % 11/01/22 1020  Vitals shown include unfiled device data.  Last Pain:  Vitals:   11/01/22 0626  TempSrc: Oral  PainSc:          Complications: No notable events documented.

## 2022-11-01 NOTE — Anesthesia Procedure Notes (Addendum)
Procedure Name: Intubation Date/Time: 11/01/2022 9:34 AM  Performed by: Ahmed Prima, CRNAPre-anesthesia Checklist: Patient identified, Emergency Drugs available, Suction available and Patient being monitored Patient Re-evaluated:Patient Re-evaluated prior to induction Oxygen Delivery Method: Circle system utilized Preoxygenation: Pre-oxygenation with 100% oxygen Induction Type: IV induction Ventilation: Two handed mask ventilation required, Oral airway inserted - appropriate to patient size and Mask ventilation with difficulty Grade View: Grade II Tube type: Oral Tube size: 7.5 mm Number of attempts: 1 Airway Equipment and Method: Stylet, Oral airway and Video-laryngoscopy Placement Confirmation: ETT inserted through vocal cords under direct vision, positive ETCO2 and breath sounds checked- equal and bilateral Secured at: 24 (at lips) cm Tube secured with: Tape Dental Injury: Teeth and Oropharynx as per pre-operative assessment  Comments: Pt. Is a difficult 2 person mask. LMA insertion attempted, but unable to ventilate pt. First view with Mac 4 on glidescope unsuccessful; 2nd attempt with Mac 3 glidescope successful.

## 2022-11-01 NOTE — Consult Note (Addendum)
NAME:  Fernando [name], MRN:  [id number], DOB:  02-Dec-1960, LOS: 0 ADMISSION DATE:  11/01/2022, CONSULTATION DATE:  10/23 REFERRING MD:  Dr. Linna Caprice, CHIEF COMPLAINT:   shock  History of Present Illness:  61 year old male with past medical history as below, which is significant for hypertension, anxiety, obstructive sleep apnea, COPD, renal mass, thoracic aortic aneurysm, and CKD.  He was mated in June 2024 for acute pancreatitis and left against medical advice the following morning.  More recent medical course significant for right knee pain and functional disability due to arthritis who failed conservative treatments and presented for elective right total knee arthroplasty on 10/23.  The procedure was initially done with spinal anesthesia, however, midway through the surgery he abruptly became hypotensive and hypoxemic prompting intubation.  His skin became red and splotchy other concern for anaphylaxis.  He was started on epinephrine infusion and was initially difficult to oxygenate and remained hypotensive.  With ongoing resuscitation he did stabilize and was ultimately transferred to the ICU after the completion of the surgery.  PCCM is asked to consult for anaphylactic shock and hypoxic respiratory failure.  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   Significant Hospital Events: Including procedures, antibiotic start and stop dates in addition to other pertinent events   10/23 r knee  arthoplasty complicated by presumptive anaphylactic shock.   Interim History / Subjective:    Objective   Blood pressure 126/87, pulse 65, temperature 97.8 F (36.6 C), temperature source Axillary, resp. rate (!) 25, weight 121.6 kg, SpO2 95%.    Vent Mode: PRVC FiO2 (%):  [100 %] 100 % Set Rate:  [25 bmp] 25 bmp Vt Set:  [650 mL] 650 mL PEEP:  [5 cmH20] 5 cmH20 Plateau Pressure:  [21 cmH20] 21 cmH20   Intake/Output Summary (Last 24 hours) at 11/01/2022 1041 Last data filed at 11/01/2022 1018 Gross per 24 hour  Intake 2700 ml  Output 280 ml  Net 2420 ml   Filed Weights   11/01/22 0550  Weight: 121.6 kg    Examination: General: Obese middle aged male in NAD HENT: [place]/AT, PERRL, no JVD Lungs: Clear bilateral breath sounds Cardiovascular: RRR, no MRG. Upper extemity nail beds are cyanotic with sluggish capillary refill. Lower extremity nailbeds are less cyanotic with more brisk cap refill.  Abdomen: Soft, NT, ND Extremities: Surgical brace in place RLE Neuro: Sedated Skin: near total body rubor sparing the face with splotchy areas of pallor.      Resolved Hospital Problem list     Assessment & Plan:   Shock: presumably secondary to anaphylaxis. Unsure what the offending agent was. Did receive cefazolin pre-operatively? - Transfer to ICU - Epinephrine infusion for MAP 65 - Pepcid, benadryl - Solumedrol x 1 - Weaning epi (2 mcg), hopefully will titrate off. If not, will place CVL.  - I did add ancef to his allergy list, although I'm not 100% certain this was the cause.  Acute respiratory failure with hypoxia: - Full vent support - ABG/CXR pending - PRN sedation for RASS goal 0 to -1 - WUA/SBT when meets criteira - VAP bundle.   Acute  metabolic encephalopathy: Likely still a component of anesthesia, but I do worry about the potential for hypoxemic injury due to prolonged hypotension/hypoxia in the OR.  - Minimized sedating agents.  - Neurochecks - STAT  labs  Arthritis of the R knee s/p total arthoplasty 10/23 - management per ortho  HTN - holding home amlodipine, olmesartan, lasix for now.   Prediabetes: A1C 5.8 - CBG monitoring and SSI > getting steroids   Best Practice (right click and "Reselect all SmartList Selections" daily)   Diet/type: NPO DVT prophylaxis: LMWH GI prophylaxis: H2B Lines: Arterial Line Foley:  N/A Code Status:  full code Last date of multidisciplinary goals of care discussion [ ]   Labs   CBC: Recent Labs  Lab 11/01/22 1007  HGB 15.0  HCT 44.0    Basic Metabolic Panel: Recent Labs  Lab 11/01/22 1007  NA 142  K 2.7*   GFR: Estimated Creatinine Clearance: 102.7 mL/min (by C-G formula based on SCr of 1.06 mg/dL). No results for input(s): "PROCALCITON", "WBC", "LATICACIDVEN" in the last 168 hours.  Liver Function Tests: No results for input(s): "AST", "ALT", "ALKPHOS", "BILITOT", "PROT", "ALBUMIN" in the last 168 hours. No results for input(s): "LIPASE", "AMYLASE" in the last 168 hours. No results for input(s): "AMMONIA" in the last 168 hours.  ABG    Component Value Date/Time   PHART 7.099 (LL) 11/01/2022 1007   PCO2ART 90.5 (HH) 11/01/2022 1007   PO2ART 349 (H) 11/01/2022 1007   HCO3 28.6 (H) 11/01/2022 1007   TCO2 31 11/01/2022 1007   ACIDBASEDEF 4.0 (H) 11/01/2022 1007   O2SAT 100 11/01/2022 1007     Coagulation Profile: No results for input(s): "INR", "PROTIME" in the last 168 hours.  Cardiac Enzymes: No results for input(s): "CKTOTAL", "CKMB", "CKMBINDEX", "TROPONINI" in the last 168 hours.  HbA1C: Hgb A1c MFr Bld  Date/Time Value Ref Range Status  10/19/2022 09:31 AM 5.8 (H) 4.8 - 5.6 % Final    Comment:    (NOTE) Pre diabetes:          5.7%-6.4%  Diabetes:              >6.4%  Glycemic control for   <7.0% adults with diabetes   08/16/2009 05:53 AM (H) <5.7 % Final   6.3 (NOTE)                                                                       According to  the ADA Clinical Practice Recommendations for 2011, when HbA1c is used as a screening test:   >=6.5%   Diagnostic of Diabetes Mellitus           (if abnormal result  is confirmed)  5.7-6.4%   Increased risk of developing Diabetes Mellitus  References:Diagnosis and Classification of Diabetes Mellitus,Diabetes Care,2011,34(Suppl 1):S62-S69 and Standards of Medical Care in         Diabetes - 2011,Diabetes Care,2011,34  (Suppl 1):S11-S61.    CBG: No results for input(s): "GLUCAP" in the last 168 hours.  Review of Systems:   Patient is encephalopathic and/or intubated; therefore, history has been obtained from chart review.    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   Surgical History:   Past Surgical History:  Procedure Laterality Date   ABSCESS DRAINAGE     APPENDECTOMY     BIOPSY  07/24/2022   Procedure: BIOPSY;  Surgeon: Lanelle Bal, DO;  [facility];  Service: Endoscopy;;   CERVICAL FUSION     COLONOSCOPY     CYSTOSCOPY N/A 05/01/2013   Procedure: CYSTOSCOPY;  Surgeon: Ky Barban, MD;  Location: [facility];  Service: Urology;  Laterality: N/A;   ESOPHAGOGASTRODUODENOSCOPY (EGD) WITH PROPOFOL N/A 07/24/2022   Procedure: ESOPHAGOGASTRODUODENOSCOPY (EGD) WITH PROPOFOL;  Surgeon: Lanelle Bal, DO;  Location: [facility];  Service: Endoscopy;  Laterality: N/A;  8:00am, asa 3   EVALUATION UNDER ANESTHESIA WITH FISTULECTOMY N/A 06/23/2015   Procedure: ANAL  EXAM UNDER ANESTHESIA  FISTULOTOMY;  Surgeon: Romie Levee, MD;  Location: [facility];  Service: General;  Laterality: N/A;   FACIAL COSMETIC SURGERY     HERNIA REPAIR     INCISION AND DRAINAGE ABSCESS Right 05/01/2013   Procedure: INCISION AND DRAINAGE RIGHT SCROTAL ABSCESS;  Surgeon: Ky Barban, MD;  Location: [facility];  Service: Urology;  Laterality: Right;   INCISION AND DRAINAGE PERIRECTAL ABSCESS     KNEE ARTHROSCOPY     SPINE SURGERY     cervical   VIDEO ASSISTED THORACOSCOPY (VATS)/ LOBECTOMY Left 05/03/2015   Procedure: LEFT VIDEO ASSISTED THORACOSCOPY (VATS)/ LEFT LOWER LOBECTOMY, ON-Q CATHETER PLACEMENT;  Surgeon: Loreli Slot, MD;  Location: [facility];  Service: Thoracic;  Laterality: Left;   VIDEO BRONCHOSCOPY WITH ENDOBRONCHIAL ULTRASOUND N/A 04/22/2015   Procedure: VIDEO BRONCHOSCOPY WITH ENDOBRONCHIAL ULTRASOUND;  Surgeon: Loreli Slot, MD;  Location: [facility];  Service: Thoracic;  Laterality: N/A;     Social History:   reports that he has been smoking cigarettes. He started smoking about 22 years ago. He has a 7.5 pack-year smoking history. He has never used smokeless tobacco. He reports current alcohol use. He reports that he does not use drugs.   Family History:  His family history includes Breast cancer in his mother; Thyroid disease in his mother.   Allergies Allergies  Allergen Reactions   Morphine And Codeine Other (See Comments)    hallucinations     Home Medications  Prior to Admission medications   Medication Sig Start Date End Date Taking? Authorizing Provider  acetaminophen (TYLENOL) 500 MG tablet Take 1,000 mg by mouth every 6 (six) hours as needed for moderate pain.   Yes [provider]  ALPRAZolam Prudy Feeler) 1 MG tablet Take 1 mg by mouth in the morning, at noon, and at bedtime.   Yes [provider]  amLODipine (NORVASC) 5 MG tablet Take 1 tablet (5 mg total) by mouth daily. 06/20/22  Yes Roemhildt, Lorin T,  PA-C  aspirin (ASPIRIN CHILDRENS) 81 MG chewable tablet Chew 1 tablet (81 mg total) by mouth 2 (two) times daily with a meal. 11/01/22 12/16/22 Yes Hill, Alain Honey, PA-C  docusate sodium (COLACE) 100 MG capsule Take 1 capsule (100 mg total) by mouth 2 (two) times daily. 11/01/22 12/01/22 Yes Hill, Alain Honey, PA-C  ibuprofen (ADVIL) 200 MG tablet Take 400 mg by mouth every 6 (six) hours  as needed for moderate pain.   Yes [provider]  methocarbamol (ROBAXIN) 500 MG tablet Take 1 tablet (500 mg total) by mouth every 6 (six) hours as needed. 11/01/22  Yes Hill, Alain Honey, PA-C  Multiple Vitamin (MULTI VITAMIN PO) Take 1 tablet by mouth daily.   Yes [provider]  olmesartan (BENICAR) 20 MG tablet Take 20 mg by mouth daily. 09/28/22  Yes [provider]  ondansetron (ZOFRAN) 4 MG tablet Take 1 tablet (4 mg total) by mouth every 8 (eight) hours as needed for nausea or vomiting. 11/01/22 11/01/23 Yes Hill, Alain Honey, PA-C  oxyCODONE (ROXICODONE) 5 MG immediate release tablet Take 1 tablet (5 mg total) by mouth every 4 (four) hours as needed for severe pain (pain score 7-10). 11/01/22  Yes Hill, Alain Honey, PA-C  pantoprazole (PROTONIX) 40 MG tablet Take 1 tablet (40 mg total) by mouth 2 (two) times daily. Patient taking differently: Take 20 mg by mouth 2 (two) times daily. 07/24/22 07/24/23 Yes Carver, Hennie Duos, DO  polyethylene glycol (MIRALAX) 17 g packet Take 17 g by mouth daily as needed for mild constipation or moderate constipation. 11/01/22 12/01/22 Yes Hill, Alain Honey, PA-C  potassium chloride SA (KLOR-CON M) 20 MEQ tablet Take 20 mEq by mouth daily as needed (cramping).   Yes [provider]  senna (SENOKOT) 8.6 MG TABS tablet Take 2 tablets (17.2 mg total) by mouth at bedtime for 15 days. 11/01/22 11/16/22 Yes Hill, Alain Honey, PA-C  furosemide (LASIX) 20 MG tablet Take 20 mg by mouth daily as needed for edema or fluid.    [provider]  oxyCODONE-acetaminophen  (PERCOCET/ROXICET) 5-325 MG tablet Take 1 tablet by mouth every 6 (six) hours as needed for severe pain. Patient not taking: Reported on 10/10/2022 06/20/22   Su Monks, PA-C     Critical care time: 46 minutes     Joneen Roach, AGACNP-BC [facility]  See Amion for personal pager PCCM on call pager [phone] until 7pm. Please call Elink 7p-7a. [phone]  11/01/2022 11:05 AM

## 2022-11-01 NOTE — Anesthesia Procedure Notes (Signed)
Spinal  Patient location during procedure: OR Start time: 11/01/2022 7:38 AM End time: 11/01/2022 7:43 AM Reason for block: surgical anesthesia Staffing Performed: anesthesiologist  Anesthesiologist: Lowella Curb, MD Performed by: Lowella Curb, MD Authorized by: Lowella Curb, MD   Preanesthetic Checklist Completed: patient identified, IV checked, site marked, risks and benefits discussed, surgical consent, monitors and equipment checked, pre-op evaluation and timeout performed Spinal Block Patient position: sitting Prep: DuraPrep Patient monitoring: heart rate, cardiac monitor, continuous pulse ox and blood pressure Approach: midline Location: L3-4 Injection technique: single-shot Needle Needle type: Quincke  Needle gauge: 22 G Needle length: 9 cm Assessment Sensory level: T4 Events: CSF return

## 2022-11-01 NOTE — Anesthesia Procedure Notes (Signed)
Arterial Line Insertion Start/End10/23/2024 9:42 AM, 11/01/2022 9:52 AM Performed by: Gaynelle Adu, MD, anesthesiologist  Patient location: Pre-op. Preanesthetic checklist: patient identified, IV checked, site marked, risks and benefits discussed, surgical consent, monitors and equipment checked, pre-op evaluation, timeout performed and anesthesia consent Lidocaine 1% used for infiltration Left, radial was placed Catheter size: 20 G Hand hygiene performed  and maximum sterile barriers used   Attempts: 1 Procedure performed using ultrasound guided technique. Ultrasound Notes:anatomy identified, needle tip was noted to be adjacent to the nerve/plexus identified, no ultrasound evidence of intravascular and/or intraneural injection and image(s) printed for medical record Following insertion, dressing applied and Biopatch. Post procedure assessment: normal and unchanged  Patient tolerated the procedure well with no immediate complications.

## 2022-11-02 ENCOUNTER — Other Ambulatory Visit: Payer: Self-pay

## 2022-11-02 ENCOUNTER — Encounter (HOSPITAL_COMMUNITY): Payer: Self-pay | Admitting: Orthopedic Surgery

## 2022-11-02 DIAGNOSIS — T782XXA Anaphylactic shock, unspecified, initial encounter: Secondary | ICD-10-CM | POA: Diagnosis not present

## 2022-11-02 LAB — GLUCOSE, CAPILLARY
Glucose-Capillary: 105 mg/dL — ABNORMAL HIGH (ref 70–99)
Glucose-Capillary: 108 mg/dL — ABNORMAL HIGH (ref 70–99)
Glucose-Capillary: 113 mg/dL — ABNORMAL HIGH (ref 70–99)
Glucose-Capillary: 157 mg/dL — ABNORMAL HIGH (ref 70–99)
Glucose-Capillary: 222 mg/dL — ABNORMAL HIGH (ref 70–99)
Glucose-Capillary: 308 mg/dL — ABNORMAL HIGH (ref 70–99)
Glucose-Capillary: 94 mg/dL (ref 70–99)

## 2022-11-02 LAB — PHOSPHORUS: Phosphorus: 2 mg/dL — ABNORMAL LOW (ref 2.5–4.6)

## 2022-11-02 LAB — CBC
HCT: 40.8 % (ref 39.0–52.0)
Hemoglobin: 14.1 g/dL (ref 13.0–17.0)
MCH: 32.9 pg (ref 26.0–34.0)
MCHC: 34.6 g/dL (ref 30.0–36.0)
MCV: 95.1 fL (ref 80.0–100.0)
Platelets: 328 10*3/uL (ref 150–400)
RBC: 4.29 MIL/uL (ref 4.22–5.81)
RDW: 12.9 % (ref 11.5–15.5)
WBC: 18.8 10*3/uL — ABNORMAL HIGH (ref 4.0–10.5)
nRBC: 0 % (ref 0.0–0.2)

## 2022-11-02 LAB — BASIC METABOLIC PANEL
Anion gap: 8 (ref 5–15)
BUN: 17 mg/dL (ref 6–20)
CO2: 22 mmol/L (ref 22–32)
Calcium: 8.3 mg/dL — ABNORMAL LOW (ref 8.9–10.3)
Chloride: 103 mmol/L (ref 98–111)
Creatinine, Ser: 1.28 mg/dL — ABNORMAL HIGH (ref 0.61–1.24)
GFR, Estimated: >60 mL/min (ref >60)
Glucose, Bld: 279 mg/dL — ABNORMAL HIGH (ref 70–99)
Potassium: 3.5 mmol/L (ref 3.5–5.1)
Sodium: 133 mmol/L — ABNORMAL LOW (ref 135–145)

## 2022-11-02 LAB — URINALYSIS, ROUTINE W REFLEX MICROSCOPIC
Glucose, UA: NEGATIVE mg/dL
Ketones, ur: NEGATIVE mg/dL
Nitrite: NEGATIVE
Protein, ur: 30 mg/dL — AB
Specific Gravity, Urine: 1.03 (ref 1.005–1.030)
pH: 6 (ref 5.0–8.0)

## 2022-11-02 LAB — MAGNESIUM: Magnesium: 2.1 mg/dL (ref 1.7–2.4)

## 2022-11-02 MED ORDER — PNEUMOCOCCAL 20-VAL CONJ VACC 0.5 ML IM SUSY
0.5000 mL | PREFILLED_SYRINGE | INTRAMUSCULAR | Status: DC
  Filled 2022-11-02: qty 0.5

## 2022-11-02 MED ORDER — DOCUSATE SODIUM 100 MG PO CAPS
100.0000 mg | ORAL_CAPSULE | Freq: Two times a day (BID) | ORAL | Status: DC
  Administered 2022-11-02 – 2022-11-03 (×3): 100 mg via ORAL
  Filled 2022-11-02 (×3): qty 1

## 2022-11-02 MED ORDER — ONDANSETRON HCL 4 MG/2ML IJ SOLN: 4.0000 mg | Freq: Four times a day (QID) | INTRAMUSCULAR | Status: DC | PRN

## 2022-11-02 MED ORDER — ACETAMINOPHEN 160 MG/5ML PO SOLN: 650.0000 mg | ORAL | Status: DC | PRN

## 2022-11-02 MED ORDER — METOCLOPRAMIDE HCL 5 MG PO TABS: 5.0000 mg | ORAL_TABLET | Freq: Three times a day (TID) | ORAL | Status: DC | PRN

## 2022-11-02 MED ORDER — PHENYLEPHRINE HCL-NACL 20-0.9 MG/250ML-% IV SOLN: 0.0000 ug/min | INTRAVENOUS | Status: DC

## 2022-11-02 MED ORDER — ORAL CARE MOUTH RINSE: 15.0000 mL | OROMUCOSAL | Status: DC | PRN

## 2022-11-02 MED ORDER — ONDANSETRON HCL 4 MG PO TABS
4.0000 mg | ORAL_TABLET | Freq: Four times a day (QID) | ORAL | Status: DC | PRN
  Filled 2022-11-02: qty 1

## 2022-11-02 MED ORDER — METOCLOPRAMIDE HCL 5 MG/ML IJ SOLN: 5.0000 mg | Freq: Three times a day (TID) | INTRAMUSCULAR | Status: DC | PRN

## 2022-11-02 MED ORDER — OXYCODONE HCL 5 MG PO TABS
5.0000 mg | ORAL_TABLET | Freq: Four times a day (QID) | ORAL | Status: DC | PRN
  Administered 2022-11-02 (×3): 10 mg via ORAL
  Filled 2022-11-02 (×3): qty 2

## 2022-11-02 MED ORDER — POLYETHYLENE GLYCOL 3350 17 G PO PACK
17.0000 g | PACK | Freq: Every day | ORAL | Status: DC
  Administered 2022-11-02: 17 g via ORAL
  Filled 2022-11-02: qty 1

## 2022-11-02 MED ORDER — ASPIRIN 81 MG PO CHEW
81.0000 mg | CHEWABLE_TABLET | Freq: Two times a day (BID) | ORAL | Status: DC
  Administered 2022-11-02 – 2022-11-03 (×3): 81 mg via ORAL
  Filled 2022-11-02 (×3): qty 1

## 2022-11-02 MED ORDER — POTASSIUM PHOSPHATES 15 MMOLE/5ML IV SOLN
15.0000 mmol | Freq: Once | INTRAVENOUS | Status: AC
Start: 2022-11-02 — End: 2022-11-02
  Administered 2022-11-02: 15 mmol via INTRAVENOUS
  Filled 2022-11-02: qty 5

## 2022-11-02 MED ORDER — ALPRAZOLAM 0.5 MG PO TABS
1.0000 mg | ORAL_TABLET | Freq: Three times a day (TID) | ORAL | Status: DC | PRN
  Administered 2022-11-02 (×2): 1 mg via ORAL
  Filled 2022-11-02 (×3): qty 2

## 2022-11-02 MED ORDER — K PHOS MONO-SOD PHOS DI & MONO 155-852-130 MG PO TABS
250.0000 mg | ORAL_TABLET | Freq: Two times a day (BID) | ORAL | Status: DC
  Administered 2022-11-02 – 2022-11-03 (×3): 250 mg via ORAL
  Filled 2022-11-02 (×3): qty 1

## 2022-11-02 MED ORDER — OXYCODONE HCL 5 MG PO TABS: 5.0000 mg | ORAL_TABLET | Freq: Four times a day (QID) | ORAL | Status: DC | PRN

## 2022-11-02 MED ORDER — ACETAMINOPHEN 160 MG/5ML PO SOLN
650.0000 mg | ORAL | Status: DC | PRN
  Administered 2022-11-02: 650 mg via ORAL
  Filled 2022-11-02: qty 20.3

## 2022-11-02 MED ORDER — ACETAMINOPHEN 325 MG PO TABS: 650.0000 mg | ORAL_TABLET | ORAL | Status: DC | PRN

## 2022-11-02 MED ORDER — HYDRALAZINE HCL 20 MG/ML IJ SOLN
10.0000 mg | INTRAMUSCULAR | Status: DC | PRN
  Administered 2022-11-02: 10 mg via INTRAVENOUS
  Filled 2022-11-02: qty 1

## 2022-11-02 NOTE — TOC Initial Note (Signed)
Transition of Care [facility]) - Initial/Assessment Note   Patient Details  Name: Fernando [name] MRN: [id number] Date of Birth: [birth year]  Transition of Care [facility]) CM/SW Contact:    Ewing Schlein, LCSW Phone Number: 11/02/2022, 3:45 PM  Clinical Narrative: Patient is a prearranged ortho surgery and the plan was to discharge home with OPPT, but patient is currently in the ICU. TOC awaiting update regarding discharge plan in the event it has changed.  Expected Discharge Plan: OP Rehab Barriers to Discharge: Continued Medical Work up  Patient Goals and CMS Choice Choice offered to / list presented to : NA  Expected Discharge Plan and Services In-house Referral: Clinical Social Work Post Acute Care Choice: NA Living arrangements for the past 2 months: Single Family Home Expected Discharge Date: 11/01/22                Prior Living Arrangements/Services Living arrangements for the past 2 months: Single Family Home Lives with:: Parents Patient language and need for interpreter reviewed:: Yes Need for Family Participation in Patient Care: No (Comment) Care giver support system in place?: Yes (comment) Criminal Activity/Legal Involvement Pertinent to Current Situation/Hospitalization: No - Comment as needed  Emotional Assessment Orientation: : Oriented to Self, Oriented to Place, Oriented to  Time, Oriented to Situation Alcohol / Substance Use: Not Applicable Psych Involvement: No (comment)  Admission diagnosis:  S/P TKR (total knee replacement), right [Z96.651] Patient Active Problem List   Diagnosis Date Noted   Osteoarthritis of right knee 11/01/2022   S/P TKR (total knee replacement), right 11/01/2022   Anaphylactic shock 11/01/2022   Pancreatitis 06/21/2022   Thoracic aortic aneurysm without rupture (HCC) 05/18/2020   Costochondral chest pain 10/12/2016   Hypothyroidism 08/27/2015   Acute respiratory failure with hypoxia (HCC) 08/27/2015   COPD (chronic obstructive pulmonary  disease) (HCC) 08/27/2015   Obstructive sleep apnea 08/27/2015   Dyspnea on exertion 08/26/2015   Left-sided chest pain 08/26/2015   Obesity 08/26/2015   Anal fistula 08/26/2015   Pedal edema 08/26/2015   Hypokalemia 08/26/2015   Chronic diarrhea 08/26/2015   Essential hypertension 08/26/2015   Pain in the chest    HCAP (healthcare-associated pneumonia) 05/12/2015   Nodule of left lung 05/03/2015   Lung mass 03/30/2015   Peri-rectal abscess 03/07/2015   Abnormal CXR 03/07/2015   Scrotal wall abscess 05/01/2013   Cellulitis 05/01/2013   Hematuria 04/30/2013   Cellulitis of scrotum 04/29/2013   Tick bite 04/29/2013   Hypertension 12/30/2011   PCP:  Samuella Bruin Pharmacy:   [facility] [place] [place] - [address] [place] [address] Phone: [phone] Fax: [phone]  Social Determinants of Health (SDOH) Social History: SDOH Screenings   Food Insecurity: No Food Insecurity (06/21/2022)  Housing: Low Risk  (06/21/2022)  Transportation Needs: No Transportation Needs (06/21/2022)  Utilities: Not At Risk (06/21/2022)  Financial Resource Strain: Low Risk  (03/24/2022)   Received from [facility]  Social Connections: Unknown (06/05/2021)   Received from [facility]  Tobacco Use: High Risk (11/01/2022)   SDOH Interventions:    Readmission Risk Interventions     No data to display

## 2022-11-02 NOTE — Procedures (Signed)
Patient Name: Fernando [name]  MRN: [id number]  Epilepsy Attending: Charlsie Quest  Referring Physician/Provider: Duayne Cal, NP  Date: 11/01/2022 Duration: 2 hoours 5 mins  Patient history: 62yo   with ams getting eeg to evaluate for seizure  Level of alertness: lethargic   AEDs during EEG study: None  Technical aspects: This EEG was obtained using a 10 lead EEG system positioned circumferentially without any parasagittal coverage (rapid EEG). Computer selected EEG is reviewed as  well as background features and all clinically significant events.  Description: EEG showed an excessive amount of 15 to 18 Hz beta activity distributed symmetrically and diffusely. NO clear posterior dominant rhythm was seen. Hyperventilation and photic stimulation were not performed.     ABNORMALITY - Excessive beta, generalized  IMPRESSION: This limited ceribell EEG is within normal limits. The excessive beta activity seen in the background is most likely due to the effect of medications like benzodiazepine and is a benign EEG pattern. No seizures or epileptiform discharges were seen throughout the recording.  Fernando [name]

## 2022-11-02 NOTE — Progress Notes (Signed)
   11/02/22 1200  Spiritual Encounters  Type of Visit Initial  Care provided to: Patient  Conversation partners present during encounter Nurse  Referral source Clinical staff  Reason for visit Urgent spiritual support  OnCall Visit No  Spiritual Framework  Presenting Themes Goals in life/care  Patient Stress Factors Family relationships;Health changes;Lack of knowledge;Loss;Loss of control;Major life changes  Family Stress Factors Loss;Loss of control  Interventions  Spiritual Care Interventions Made Established relationship of care and support;Compassionate presence;Reflective listening;Normalization of emotions   Met with patient who is in crisis due to currently hpsitlized in ICU and just got word his Father is in hospital in [place] actively dying.  Provided counsel and comfort  - spoke with support staff and provided blanklet for comfort at this time.

## 2022-11-02 NOTE — Progress Notes (Signed)
   11/02/22 0818  Vent Select  Invasive or Noninvasive Invasive  Adult Vent Y  [REMOVED] Airway 7.5 mm  Removal Date/Time: 11/01/22 2017  Placement Date/Time: 11/01/22 (c) 0934   Grade View: Grade 2  Size (mm): 7.5 mm  Cuffed: Cuffed;Min.occ.pres.  Insertion attempts: 1  Airway Equipment: Stylet;Video Laryngoscope  Placement Confirmation: ETCO2 (Capnogr...  Secured at (cm) 24 cm  Measured From Lips  Secured Location Center  Secured By [employer]  Prone position No  Head position Left  Cuff Pressure (cm H2O) Green OR 18-26 CmH2O  Site Condition Cool;Dry  Adult Ventilator Settings  Vent Type Servo i  Humidity HME  Vent Mode (S)  PSV;CPAP  FiO2 (%) (S)  40 %  Pressure Support (S)  5 cmH20  PEEP (S)  5 cmH20  Adult Ventilator Measurements  Peak Airway Pressure 11 L/min  Mean Airway Pressure 8 cmH20  Resp Rate Spontaneous 24 br/min  Resp Rate Total 24 br/min  Spont TV 799 mL  Measured Ve 14.2 L  Total PEEP 5 cmH20  SpO2 99 %  Adult Ventilator Alarms  Alarms On Y  Ve High Alarm 23 L/min  Ve Low Alarm 4 L/min  Resp Rate High Alarm 40 br/min  Resp Rate Low Alarm 8  PEEP Low Alarm 2 cmH2O  Press High Alarm 40 cmH2O  T Apnea 20 sec(s)  VAP Prevention  HOB> 30 Degrees Y  Daily Weaning Assessment  Daily Assessment of Readiness to Wean Wean protocol criteria met (SBT performed)  SBT Method (S)  CPAP 5 cm H20 and PS 5 cm H20 (Changed over by provider.)  Weaning Start Time [phone]  Patient response Passed (Tolerated well)  Breath Sounds  Bilateral Breath Sounds Clear;Diminished  R Upper  Breath Sounds Clear  L Upper Breath Sounds Clear  R Lower Breath Sounds Diminished  L Lower Breath Sounds Diminished  Vent Respiratory Assessment  Level of Consciousness Alert  Respiratory Pattern Regular;Unlabored  Suction Method  Respiratory Interventions (S)   (Pt declined oral and ETT suctioning at this time. Positive cuff leak noted.)

## 2022-11-02 NOTE — Procedures (Signed)
Extubation Procedure Note  Patient Details:   Name: DEIONDRE CHORNEY DOB: 10-03-1960 MRN: [id number]   Airway Documentation:    Vent end date: (not recorded) Vent end time: (not recorded)   Evaluation  O2 sats: stable throughout Complications: No apparent complications Patient did tolerate procedure well. Bilateral Breath Sounds: Clear, Diminished   Yes Extubated to 2 L [place].   Nunzio Cobbs 11/02/2022, 8:55 AM

## 2022-11-02 NOTE — Progress Notes (Signed)
Patient tolerated AM ventilator wean well. Orders placed for extubation by Joneen Roach, NP. Patient extubated with RT at 8:50 AM. Tolerated extubation well. Mouth swabbed and suctioned prior to extubation and after. Per protocol OG tube removed by RN. Patient now breathing comfortably on 2L [place]. RN will continue to assess for changes in breathing status.

## 2022-11-02 NOTE — Progress Notes (Signed)
[facility] ADULT ICU REPLACEMENT PROTOCOL   The patient does apply for the [facility] Adult ICU Electrolyte Replacment Protocol based on the criteria listed below:   1.Exclusion criteria: TCTS, ECMO, Dialysis, and Myasthenia Gravis patients 2. Is GFR >/= 30 ml/min? Yes.    Patient's GFR today is >60 3. Is SCr </= 2? Yes.   Patient's SCr is 1.28 mg/dL 4. Did SCr increase >/= 0.5 in 24 hours? No. 5.Pt's weight >40kg  Yes.   6. Abnormal electrolyte(s): K+ = 3.5, Phos = 2.0  7. Electrolytes replaced per protocol 8.  Call MD STAT for K+ </= 2.5, Phos </= 1, or Mag </= 1 Physician:  Ogan. eMD  Faye Ramsay 11/02/2022 3:40 AM

## 2022-11-02 NOTE — Plan of Care (Signed)
  Problem: Education: Goal: Knowledge of General Education information will improve Description: Including pain rating scale, medication(s)/side effects and non-pharmacologic comfort measures Outcome: Progressing   Problem: Health Behavior/Discharge Planning: Goal: Ability to manage health-related needs will improve Outcome: Progressing   Problem: Clinical Measurements: Goal: Ability to maintain clinical measurements within normal limits will improve Outcome: Progressing Goal: Will remain free from infection Outcome: Progressing Goal: Diagnostic test results will improve Outcome: Progressing Goal: Respiratory complications will improve Outcome: Progressing Goal: Cardiovascular complication will be avoided Outcome: Progressing   Problem: Activity: Goal: Risk for activity intolerance will decrease Outcome: Progressing   Problem: Nutrition: Goal: Adequate nutrition will be maintained Outcome: Progressing   Problem: Coping: Goal: Level of anxiety will decrease Outcome: Progressing   Problem: Elimination: Goal: Will not experience complications related to bowel motility Outcome: Progressing Goal: Will not experience complications related to urinary retention Outcome: Progressing   Problem: Pain Management: Goal: General experience of comfort will improve Outcome: Progressing   Problem: Safety: Goal: Ability to remain free from injury will improve Outcome: Progressing   Problem: Skin Integrity: Goal: Risk for impaired skin integrity will decrease Outcome: Progressing   Problem: Activity: Goal: Ability to tolerate increased activity will improve Outcome: Progressing   Problem: Respiratory: Goal: Ability to maintain a clear airway and adequate ventilation will improve Outcome: Progressing   Problem: Role Relationship: Goal: Method of communication will improve Outcome: Progressing   Problem: Education: Goal: Ability to describe self-care measures that may  prevent or decrease complications (Diabetes Survival Skills Education) will improve Outcome: Progressing Goal: Individualized Educational Video(s) Outcome: Progressing   Problem: Coping: Goal: Ability to adjust to condition or change in health will improve Outcome: Progressing   Problem: Fluid Volume: Goal: Ability to maintain a balanced intake and output will improve Outcome: Progressing   Problem: Health Behavior/Discharge Planning: Goal: Ability to identify and utilize available resources and services will improve Outcome: Progressing Goal: Ability to manage health-related needs will improve Outcome: Progressing   Problem: Metabolic: Goal: Ability to maintain appropriate glucose levels will improve Outcome: Progressing   Problem: Nutritional: Goal: Maintenance of adequate nutrition will improve Outcome: Progressing Goal: Progress toward achieving an optimal weight will improve Outcome: Progressing   Problem: Skin Integrity: Goal: Risk for impaired skin integrity will decrease Outcome: Progressing   Problem: Tissue Perfusion: Goal: Adequacy of tissue perfusion will improve Outcome: Progressing   Problem: Education: Goal: Knowledge of the prescribed therapeutic regimen will improve Outcome: Progressing Goal: Individualized Educational Video(s) Outcome: Progressing   Problem: Activity: Goal: Ability to avoid complications of mobility impairment will improve Outcome: Progressing Goal: Range of joint motion will improve Outcome: Progressing   Problem: Clinical Measurements: Goal: Postoperative complications will be avoided or minimized Outcome: Progressing   Problem: Pain Management: Goal: Pain level will decrease with appropriate interventions Outcome: Progressing   Problem: Skin Integrity: Goal: Will show signs of wound healing Outcome: Progressing

## 2022-11-02 NOTE — Progress Notes (Addendum)
PCCM INTERVAL PROGRESS NOTE  Patient tolerating 5/5 well for 30+ minutes. RSBI 42 Positive cuff leak Will plan to extubate - discussed with MD and RT  Joneen Roach, AGACNP-BC [facility]  See Amion for personal pager PCCM on call pager [phone] until 7pm. Please call Elink 7p-7a. [phone]  11/02/2022 8:42 AM

## 2022-11-02 NOTE — Evaluation (Signed)
Physical Therapy Evaluation Patient Details Name: Fernando [name] MRN: [id number] DOB: Apr 08, 1960 Today's Date: 11/02/2022  History of Present Illness  62 yo male S/P RTKA 11/01/22, complicated by anaphalactic shock, intubated, extubated 11/02/22. PMH: Left lower Lobectomy, COPD, HTN, perirectal abscess, kidney cancer.  Clinical Impression  Pt admitted with above diagnosis.  Pt currently with functional limitations due to the deficits listed below (see PT Problem List). Pt will benefit from acute skilled PT to increase their independence and safety with mobility to allow discharge.   The patient is A/O x 4, eager to ambulate. Patient tolerated ambulating x 180'. HR 81- 132 with activity.  BP 160/85. SPO2 on RA 94%, dyspnea 3/4 with activity. Encouraged to slow breath rate. Patient with noted tremors of body.  The patient should progress to return home when PT goals met. The patient is eager to return home with  father being ill. Patient's family available to assist at DC.         If plan is discharge home, recommend the following: A little help with walking and/or transfers;Help with stairs or ramp for entrance;Assistance with cooking/housework;Assist for transportation   Can travel by private vehicle        Equipment Recommendations Rolling walker (2 wheels)  Recommendations for Other Services       Functional Status Assessment Patient has had a recent decline in their functional status and demonstrates the ability to make significant improvements in function in a reasonable and predictable amount of time.     Precautions / Restrictions Precautions Precautions: Knee;Fall Precaution Comments: monitor BP/HR, extubated Restrictions Weight Bearing Restrictions: No      Mobility  Bed Mobility Overal bed mobility: Needs Assistance Bed Mobility: Supine to Sit     Supine to sit: Min assist     General bed mobility comments: assist to pull up to sitting, quite tremulous in arms  and body    Transfers Overall transfer level: Needs assistance Equipment used: Rolling walker (2 wheels) Transfers: Sit to/from Stand Sit to Stand: Min assist, +2 safety/equipment, From elevated surface           General transfer comment: cues for hand and right leg position to stand and sit down    Ambulation/Gait Ambulation/Gait assistance: Min assist, +2 safety/equipment Gait Distance (Feet): 280 Feet Assistive device: Rolling walker (2 wheels) Gait Pattern/deviations: Step-through pattern Gait velocity: decr     General Gait Details: cues for sequence and  position inside Rw, Cues to slow cadence. 1 rest standing break  Stairs            Wheelchair Mobility     Tilt Bed    Modified Rankin (Stroke Patients Only)       Balance Overall balance assessment: Mild deficits observed, not formally tested                                           Pertinent Vitals/Pain Pain Assessment Pain Assessment: 0-10 Pain Score: 6  Pain Location: right knee Pain Descriptors / Indicators: Aching Pain Intervention(s): Monitored during session, Ice applied, Premedicated before session    Home Living Family/patient expects to be discharged to:: Private residence Living Arrangements: Parent;Other relatives Available Help at Discharge: Family;Available 24 hours/day Type of Home: House Home Access: Stairs to enter   [employer] of Steps: 1   Home Layout: Two level;Able to live on main level with  bedroom/bathroom Home Equipment: Cane - single point Additional Comments: upright RW(fathers)    Prior Function Prior Level of Function : Independent/Modified Independent             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, Naval architect ADLs Comments: IADLs     Extremity/Trunk Assessment   Upper Extremity Assessment Upper Extremity Assessment: Overall WFL for tasks assessed (noted tremors)    Lower Extremity Assessment Lower Extremity  Assessment: RLE deficits/detail;Generalized weakness RLE Deficits / Details: able  to SLR, Knee flexion 10-80    Cervical / Trunk Assessment Cervical / Trunk Assessment: Normal  Communication   Communication Communication: No apparent difficulties  Cognition Arousal: Alert Behavior During Therapy: WFL for tasks assessed/performed Overall Cognitive Status: Within Functional Limits for tasks assessed                                          General Comments      Exercises     Assessment/Plan    PT Assessment Patient needs continued PT services  PT Problem List Decreased strength;Decreased knowledge of precautions;Decreased mobility;Decreased activity tolerance;Decreased safety awareness;Cardiopulmonary status limiting activity;Decreased knowledge of use of DME       PT Treatment Interventions DME instruction;Therapeutic activities;Gait training;Therapeutic exercise;Patient/family education;Stair training;Functional mobility training    PT Goals (Current goals can be found in the Care Plan section)  Acute Rehab PT Goals Patient Stated Goal: home ASAP, father is in hospital PT Goal Formulation: With patient Time For Goal Achievement: 11/09/22 Potential to Achieve Goals: Good    Frequency 7X/week     Co-evaluation               AM-PAC PT "6 Clicks" Mobility  Outcome Measure Help needed turning from your back to your side while in a flat bed without using bedrails?: A Little Help needed moving from lying on your back to sitting on the side of a flat bed without using bedrails?: A Little Help needed moving to and from a bed to a chair (including a wheelchair)?: A Little Help needed standing up from a chair using your arms (e.g., wheelchair or bedside chair)?: A Little Help needed to walk in hospital room?: A Little Help needed climbing 3-5 steps with a railing? : A Lot 6 Click Score: 17    End of Session Equipment Utilized During Treatment: Gait  belt Activity Tolerance: Patient tolerated treatment well Patient left: in chair;with call bell/phone within reach;with nursing/sitter in room Nurse Communication: Mobility status PT Visit Diagnosis: Unsteadiness on feet (R26.81);Muscle weakness (generalized) (M62.81);Difficulty in walking, not elsewhere classified (R26.2);Pain Pain - Right/Left: Right Pain - part of body: Knee    Time: 4098-1191 PT Time Calculation (min) (ACUTE ONLY): 30 min   Charges:   PT Evaluation $PT Eval Low Complexity: 1 Low PT Treatments $Gait Training: 8-22 mins PT General Charges $$ ACUTE PT VISIT: 1 Visit         Blanchard Kelch PT Acute Rehabilitation Services Office [phone] Weekend pager-[phone]   Rada Hay 11/02/2022, 4:14 PM

## 2022-11-02 NOTE — Plan of Care (Signed)
  Problem: Clinical Measurements: Goal: Respiratory complications will improve Outcome: Progressing   Problem: Activity: Goal: Risk for activity intolerance will decrease Outcome: Progressing   Problem: Nutrition: Goal: Adequate nutrition will be maintained Outcome: Progressing   Problem: Pain Management: Goal: General experience of comfort will improve Outcome: Progressing   Problem: Safety: Goal: Ability to remain free from injury will improve Outcome: Progressing   Problem: Respiratory: Goal: Ability to maintain a clear airway and adequate ventilation will improve Outcome: Progressing   Problem: Role Relationship: Goal: Method of communication will improve Outcome: Progressing   Problem: Coping: Goal: Ability to adjust to condition or change in health will improve Outcome: Progressing   Problem: Nutritional: Goal: Maintenance of adequate nutrition will improve Outcome: Progressing   Problem: Skin Integrity: Goal: Risk for impaired skin integrity will decrease Outcome: Progressing   Problem: Activity: Goal: Ability to avoid complications of mobility impairment will improve Outcome: Progressing Goal: Range of joint motion will improve Outcome: Progressing   Problem: Pain Management: Goal: Pain level will decrease with appropriate interventions Outcome: Progressing

## 2022-11-02 NOTE — Progress Notes (Addendum)
    Subjective: Patient currently intubated.  Alert and responsive. Patient reports pain as moderate.  Denies N/V/CP/SOB.  Critical care provider at bedside this morning.   Objective:   VITALS:   Vitals:   11/02/22 1543 11/02/22 1600 11/02/22 1703 11/02/22 1800  BP:  (!) 140/72  (!) 156/90  Pulse: 96 99 (!) 110 99  Resp: 20 (!) 21 15 20   Temp:  98 F (36.7 C)    TempSrc:  Oral    SpO2: 96% 98% 100% 93%  Weight:      Height:        Patient sitting up in bed. Intubated. NAD.  Neurologically intact ABD soft Neurovascular intact Sensation intact distally Intact pulses distally Dorsiflexion/Plantar flexion intact Incision: dressing C/D/I No cellulitis present Compartment soft   Lab Results  Component Value Date   WBC 18.8 (H) 11/02/2022   HGB 14.1 11/02/2022   HCT 40.8 11/02/2022   MCV 95.1 11/02/2022   PLT 328 11/02/2022   BMET    Component Value Date/Time   NA 133 (L) 11/02/2022 0210   K 3.5 11/02/2022 0210   CL 103 11/02/2022 0210   CO2 22 11/02/2022 0210   GLUCOSE 279 (H) 11/02/2022 0210   BUN 17 11/02/2022 0210   CREATININE 1.28 (H) 11/02/2022 0210   CALCIUM 8.3 (L) 11/02/2022 0210   GFRNONAA >60 11/02/2022 0210     Assessment/Plan: 1 Day Post-Op   Principal Problem:   Osteoarthritis of right knee Active Problems:   S/P TKR (total knee replacement), right   Anaphylactic shock   WBAT with walker DVT ppx: Lovenox, SCDs, TEDS PO pain control PT/OT: Has not seen yet.  Dispo:  - Plan for extubation and removal of vasopressors today pending stability per critical care provider.  - Up with PT as tolerated.    Clois Dupes, PA-C 11/02/2022, 6:24 PM   [facility]  [place] [address], [place], [place] [address] Phone: [phone] [email or web address] Facebook  [employer]

## 2022-11-02 NOTE — Consult Note (Deleted)
NAME:  Fernando [name], MRN:  [id number], DOB:  29-Jun-1960, LOS: 1 ADMISSION DATE:  11/01/2022, CONSULTATION DATE:  10/23 REFERRING MD:  Dr. Linna Caprice, CHIEF COMPLAINT:   shock  History of Present Illness:  62 year old male with past medical history as below, which is significant for hypertension, anxiety, obstructive sleep apnea, COPD, renal mass, thoracic aortic aneurysm, and CKD.  He was mated in June 2024 for acute pancreatitis and left against medical advice the following morning.  More recent medical course significant for right knee pain and functional disability due to arthritis who failed conservative treatments and presented for elective right total knee arthroplasty on 10/23.  The procedure was initially done with spinal anesthesia, however, midway through the surgery he abruptly became hypotensive and hypoxemic prompting intubation.  His skin became red and splotchy other concern for anaphylaxis.  He was started on epinephrine infusion and was initially difficult to oxygenate and remained hypotensive.  With ongoing resuscitation he did stabilize and was ultimately transferred to the ICU after the completion of the surgery.  PCCM is asked to consult for anaphylactic shock and hypoxic respiratory failure.  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   Significant Hospital Events: Including procedures, antibiotic start and stop dates in addition to other pertinent events   10/23 r knee  arthoplasty complicated by presumptive anaphylactic shock.  10/24 off pressors. Weaning on vent. Hives and redness gone.   Interim History / Subjective:  Awake on vent. No complaints Wants tube out Tolerating SBT Some sediment noted in foley collection bag per RN.   Objective   Blood pressure 114/74, pulse (!) 51, temperature 99.1 F (37.3 C), temperature source Oral, resp. rate (!) 25, height 6\' 2"  (1.88 m), weight 123.9 kg, SpO2 99%.    Vent Mode: PRVC FiO2 (%):  [40 %-100 %] 40 % Set Rate:  [25 bmp] 25 bmp Vt Set:  [650 mL] 650 mL PEEP:  [5 cmH20] 5 cmH20 Plateau Pressure:  [6 cmH20-21 cmH20] 16 cmH20   Intake/Output Summary (Last 24 hours) at 11/02/2022 0805 Last data filed at 11/02/2022 0703 Gross per 24 hour  Intake 5575.88 ml  Output 2440 ml  Net 3135.88 ml   Filed Weights   11/01/22 0550 11/01/22 1146  Weight: 121.6 kg 123.9 kg    Examination: General: Middle aged male in NAD HENT: [place]/AT, PERRL, no JVD. Mild periorbital edema.  Lungs: Clear bilateral breath sounds Cardiovascular: RRR, no MRG. Nail beds are no longer cyanotic and cap refill is improved  Abdomen: Soft, NT, ND Extremities: Surgical brace in place RLE, distal extremity is warm with brisk capillary refill.  Neuro: awake on vent, communicating well and following commands.  Skin: Rubor and hives have resolved.   Images from 10/23     Resolved Hospital Problem list     Assessment & Plan:   Shock: presumably secondary to anaphylaxis. Unsure what the offending agent was. Did receive cefazolin pre-operatively - Transfer to ICU - Epinephrine infusion for MAP 65 - Pepcid, benadryl - Solumedrol x 1 - Tryptase level pending - Epi and vaso now off.  - I did add ancef to  his allergy list, although I'm not 100% certain this was the cause.  Acute respiratory failure with hypoxia: - Full vent support - PRN sedation for RASS goal 0 - SBT now - Will need to ensure cuff leak prior to extubation >  hopeful for today.  - VAP bundle.   Acute metabolic encephalopathy: Likely still a component of anesthesia, but I do worry about the potential for hypoxemic injury due to prolonged hypotension/hypoxia in the OR. > He is now awake and communicating. Good strength in all extremities.  - Minimize sedating agents.   Arthritis of the R knee s/p total arthoplasty 10/23 - management per ortho - PT to start today  AKI: likely 2/2 shock Hypok hypophos - trend chemistry - UA  HTN - holding home amlodipine, olmesartan, lasix for now.   Prediabetes: A1C 5.8 - CBG monitoring and SSI > getting steroids   Best Practice (right click and "Reselect all SmartList Selections" daily)   Diet/type: NPO DVT prophylaxis: LMWH GI prophylaxis: H2B Lines: Arterial Line Foley:  N/A Code Status:  full code Last date of multidisciplinary goals of care discussion [ ]    Critical care time: 36 minutes     Joneen Roach, AGACNP-BC [facility]  See Amion for personal pager PCCM on call pager [phone] until 7pm. Please call Elink 7p-7a. [phone]  11/02/2022 8:05 AM

## 2022-11-02 NOTE — Progress Notes (Signed)
NAME:  Fernando [name], MRN:  [id number], DOB:  04/08/1960, LOS: 1 ADMISSION DATE:  11/01/2022, CONSULTATION DATE:  10/23 REFERRING MD:  Dr. Linna Caprice, CHIEF COMPLAINT:   shock  History of Present Illness:  62 year old male with past medical history as below, which is significant for hypertension, anxiety, obstructive sleep apnea, COPD, renal mass, thoracic aortic aneurysm, and CKD.  He was mated in June 2024 for acute pancreatitis and left against medical advice the following morning.  More recent medical course significant for right knee pain and functional disability due to arthritis who failed conservative treatments and presented for elective right total knee arthroplasty on 10/23.  The procedure was initially done with spinal anesthesia, however, midway through the surgery he abruptly became hypotensive and hypoxemic prompting intubation.  His skin became red and splotchy other concern for anaphylaxis.  He was started on epinephrine infusion and was initially difficult to oxygenate and remained hypotensive.  With ongoing resuscitation he did stabilize and was ultimately transferred to the ICU after the completion of the surgery.  PCCM is asked to consult for anaphylactic shock and hypoxic respiratory failure.  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   Significant Hospital Events: Including procedures, antibiotic start and stop dates in addition to other pertinent events   10/23 r knee  arthoplasty complicated by presumptive anaphylactic shock.  10/24 off pressors. Weaning on vent. Hives and redness gone.   Interim History / Subjective:  Awake on vent. No complaints Wants tube out Tolerating SBT Some sediment noted in foley collection bag per RN.   Objective   Blood pressure 114/74, pulse (!) 51, temperature 99.1 F (37.3 C), temperature source Oral, resp. rate (!) 25, height 6\' 2"  (1.88 m), weight 123.9 kg, SpO2 99%.    Vent Mode: PRVC FiO2 (%):  [40 %-100 %] 40 % Set Rate:  [25 bmp] 25 bmp Vt Set:  [650 mL] 650 mL PEEP:  [5 cmH20] 5 cmH20 Plateau Pressure:  [6 cmH20-21 cmH20] 16 cmH20   Intake/Output Summary (Last 24 hours) at 11/02/2022 0831 Last data filed at 11/02/2022 0813 Gross per 24 hour  Intake 4995.05 ml  Output 2330 ml  Net 2665.05 ml   Filed Weights   11/01/22 0550 11/01/22 1146  Weight: 121.6 kg 123.9 kg    Examination: General: Middle aged male in NAD HENT: [place]/AT, PERRL, no JVD. Mild periorbital edema.  Lungs: Clear bilateral breath sounds Cardiovascular: RRR, no MRG. Nail beds are no longer cyanotic and cap refill is improved  Abdomen: Soft, NT, ND Extremities: Surgical brace in place RLE, distal extremity is warm with brisk capillary refill.  Neuro: awake on vent, communicating well and following commands.  Skin: Rubor and hives have resolved.   Images from 10/23     Resolved Hospital Problem list     Assessment & Plan:   Shock: presumably secondary to anaphylaxis. Unsure what the offending agent was. Did receive cefazolin pre-operatively - Transfer to ICU - Epinephrine infusion for MAP 65 - Pepcid, benadryl - Solumedrol x 1 - Tryptase level pending - Epi and vaso now off.  - I did add ancef to  his allergy list, although I'm not 100% certain this was the cause.  Acute respiratory failure with hypoxia: - Full vent support - PRN sedation for RASS goal 0 - SBT now - Will need to ensure cuff leak prior to extubation >  hopeful for today.  - VAP bundle.   Acute metabolic encephalopathy: Likely still a component of anesthesia, but I do worry about the potential for hypoxemic injury due to prolonged hypotension/hypoxia in the OR. > He is now awake and communicating. Good strength in all extremities.  - Minimize sedating agents.   Arthritis of the R knee s/p total arthoplasty 10/23 - management per ortho - PT to start today  AKI: likely 2/2 shock Hypok hypophos - trend chemistry - UA  HTN - holding home amlodipine, olmesartan, lasix for now.   Prediabetes: A1C 5.8 - CBG monitoring and SSI > getting steroids   Best Practice (right click and "Reselect all SmartList Selections" daily)   Diet/type: NPO DVT prophylaxis: LMWH GI prophylaxis: H2B Lines: Arterial Line Foley:  N/A Code Status:  full code Last date of multidisciplinary goals of care discussion [ ]    Critical care time: 36 minutes     Joneen Roach, AGACNP-BC [facility]  See Amion for personal pager PCCM on call pager [phone] until 7pm. Please call Elink 7p-7a. [phone]  11/02/2022 8:31 AM

## 2022-11-02 NOTE — Progress Notes (Addendum)
Physical Therapy Treatment Patient Details Name: Fernando [name] MRN: [id number] DOB: Oct 24, 1960 Today's Date: 11/02/2022   History of Present Illness 62 yo male S/P RTKA 11/01/22, complicated by anaphalactic shock, intubated, extubated 11/02/22. PMH:HTN,  perirectal abcess, Left lower lobectomy, COPD, kidney cancer    PT Comments  Patient performed  R Knee there exercises in recliner, instructed in  heel lift stretching when resting. Patient hopeful for Dc tomorrow. Continues  with tremors .    If plan is discharge home, recommend the following: A little help with walking and/or transfers;Help with stairs or ramp for entrance;Assistance with cooking/housework;Assist for transportation   Can travel by private vehicle        Equipment Recommendations  Rolling walker (2 wheels)    Recommendations for Other Services       Precautions / Restrictions Precautions Precautions: Knee;Fall Precaution Comments: monitor BP/HR, Restrictions Weight Bearing Restrictions: No     Mobility  Bed Mobility     General bed mobility comments: in recliner    Transfers                   Stairs             Wheelchair Mobility     Tilt Bed    Modified Rankin (Stroke Patients Only)       Balance Overall balance assessment: Mild deficits observed, not formally tested                                          Cognition Arousal: Alert Behavior During Therapy: WFL for tasks assessed/performed Overall Cognitive Status: Within Functional Limits for tasks assessed                                          Exercises Total Joint Exercises Ankle Circles/Pumps: AROM, Both Quad Sets: AROM, Both, 10 reps Heel Slides: AAROM, Right, 5 reps Straight Leg Raises: AROM, 10 reps, Right    General Comments        Pertinent Vitals/Pain Pain Assessment Pain Assessment: 0-10 Pain Score: 6  Pain Location: right knee Pain Descriptors /  Indicators: Aching Pain Intervention(s): Patient requesting pain meds-RN notified, Ice applied, Repositioned    Home Living Family/patient expects to be discharged to:: Private residence Living Arrangements: Parent;Other relatives Available Help at Discharge: Family;Available 24 hours/day Type of Home: House Home Access: Stairs to enter   [employer] of Steps: 1   Home Layout: Two level;Able to live on main level with bedroom/bathroom Home Equipment: Cane - single point Additional Comments: upright RW(fathers)    Prior Function            PT Goals (current goals can now be found in the care plan section) Acute Rehab PT Goals Patient Stated Goal: home ASAP, father is in hospital PT Goal Formulation: With patient Time For Goal Achievement: 11/09/22 Potential to Achieve Goals: Good Progress towards PT goals: Progressing toward goals    Frequency    7X/week      PT Plan      Co-evaluation              AM-PAC PT "6 Clicks" Mobility   Outcome Measure  Help needed turning from your back to your side while in a flat bed without using bedrails?: A  Little Help needed moving from lying on your back to sitting on the side of a flat bed without using bedrails?: A Little Help needed moving to and from a bed to a chair (including a wheelchair)?: A Little Help needed standing up from a chair using your arms (e.g., wheelchair or bedside chair)?: A Little Help needed to walk in hospital room?: A Little Help needed climbing 3-5 steps with a railing? : A Lot 6 Click Score: 17    End of Session Equipment Utilized During Treatment: Gait belt Activity Tolerance: Patient tolerated treatment well Patient left: in chair;with call bell/phone within reach;with nursing/sitter in room Nurse Communication: Mobility status PT Visit Diagnosis: Unsteadiness on feet (R26.81);Muscle weakness (generalized) (M62.81);Difficulty in walking, not elsewhere classified (R26.2);Pain Pain  - Right/Left: Right Pain - part of body: Knee     Time: 4098-1191 PT Time Calculation (min) (ACUTE ONLY): 15 min  Charges:    $Therapeutic Exercise: 8-22 mins PT General Charges $$ ACUTE PT VISIT: 1 Visit                     Blanchard Kelch PT Acute Rehabilitation Services Office [phone] Weekend [phone]    Rada Hay 11/02/2022, 4:47 PM

## 2022-11-03 DIAGNOSIS — M1711 Unilateral primary osteoarthritis, right knee: Secondary | ICD-10-CM | POA: Diagnosis not present

## 2022-11-03 LAB — BASIC METABOLIC PANEL
Anion gap: 10 (ref 5–15)
BUN: 19 mg/dL (ref 6–20)
CO2: 23 mmol/L (ref 22–32)
Calcium: 9.1 mg/dL (ref 8.9–10.3)
Chloride: 101 mmol/L (ref 98–111)
Creatinine, Ser: 0.91 mg/dL (ref 0.61–1.24)
GFR, Estimated: >60 mL/min (ref >60)
Glucose, Bld: 135 mg/dL — ABNORMAL HIGH (ref 70–99)
Potassium: 3.4 mmol/L — ABNORMAL LOW (ref 3.5–5.1)
Sodium: 134 mmol/L — ABNORMAL LOW (ref 135–145)

## 2022-11-03 LAB — MAGNESIUM: Magnesium: 2.2 mg/dL (ref 1.7–2.4)

## 2022-11-03 LAB — CBC
HCT: 34.3 % — ABNORMAL LOW (ref 39.0–52.0)
Hemoglobin: 11.9 g/dL — ABNORMAL LOW (ref 13.0–17.0)
MCH: 32.6 pg (ref 26.0–34.0)
MCHC: 34.7 g/dL (ref 30.0–36.0)
MCV: 94 fL (ref 80.0–100.0)
Platelets: 276 10*3/uL (ref 150–400)
RBC: 3.65 MIL/uL — ABNORMAL LOW (ref 4.22–5.81)
RDW: 12.9 % (ref 11.5–15.5)
WBC: 22.1 10*3/uL — ABNORMAL HIGH (ref 4.0–10.5)
nRBC: 0 % (ref 0.0–0.2)

## 2022-11-03 LAB — GLUCOSE, CAPILLARY
Glucose-Capillary: 87 mg/dL (ref 70–99)
Glucose-Capillary: 99 mg/dL (ref 70–99)
Glucose-Capillary: 144 mg/dL — ABNORMAL HIGH (ref 70–99)
Glucose-Capillary: 87 mg/dL (ref 70–99)
Glucose-Capillary: 97 mg/dL (ref 70–99)

## 2022-11-03 LAB — PHOSPHORUS: Phosphorus: 1.9 mg/dL — ABNORMAL LOW (ref 2.5–4.6)

## 2022-11-03 MED ORDER — ALPRAZOLAM 0.5 MG PO TABS
1.0000 mg | ORAL_TABLET | Freq: Once | ORAL | Status: AC
  Administered 2022-11-03: 1 mg via ORAL

## 2022-11-03 MED ORDER — POTASSIUM CHLORIDE CRYS ER 20 MEQ PO TBCR
40.0000 meq | EXTENDED_RELEASE_TABLET | Freq: Once | ORAL | Status: AC
  Administered 2022-11-03: 40 meq via ORAL
  Filled 2022-11-03: qty 2

## 2022-11-03 MED ORDER — POTASSIUM PHOSPHATES 15 MMOLE/5ML IV SOLN
15.0000 mmol | Freq: Once | INTRAVENOUS | Status: AC
  Administered 2022-11-03: 15 mmol via INTRAVENOUS
  Filled 2022-11-03: qty 5

## 2022-11-03 MED ORDER — PREDNISONE 10 MG PO TABS
40.0000 mg | ORAL_TABLET | Freq: Every day | ORAL | 0 refills | Status: AC
Start: 2022-11-03 — End: 2022-11-06

## 2022-11-03 MED ORDER — PANTOPRAZOLE SODIUM 20 MG PO TBEC
20.0000 mg | DELAYED_RELEASE_TABLET | Freq: Two times a day (BID) | ORAL | Status: DC
  Administered 2022-11-03: 20 mg via ORAL
  Filled 2022-11-03: qty 1

## 2022-11-03 MED ORDER — AMLODIPINE BESYLATE 5 MG PO TABS
5.0000 mg | ORAL_TABLET | Freq: Every day | ORAL | Status: DC
  Administered 2022-11-03: 5 mg via ORAL
  Filled 2022-11-03: qty 1

## 2022-11-03 MED ORDER — HYDRALAZINE HCL 20 MG/ML IJ SOLN
20.0000 mg | Freq: Once | INTRAMUSCULAR | Status: AC
  Administered 2022-11-03: 20 mg via INTRAVENOUS
  Filled 2022-11-03: qty 1

## 2022-11-03 MED ORDER — IRBESARTAN 150 MG PO TABS
150.0000 mg | ORAL_TABLET | Freq: Every day | ORAL | Status: DC
  Administered 2022-11-03: 150 mg via ORAL
  Filled 2022-11-03: qty 1

## 2022-11-03 MED ORDER — DEXMEDETOMIDINE HCL IN NACL 200 MCG/50ML IV SOLN
0.0000 ug/kg/h | INTRAVENOUS | Status: DC
  Administered 2022-11-03: 0.5 ug/kg/h via INTRAVENOUS
  Administered 2022-11-03: 0.4 ug/kg/h via INTRAVENOUS
  Filled 2022-11-03 (×2): qty 50

## 2022-11-03 NOTE — Progress Notes (Signed)
    Subjective:  Patient reports pain as mild to moderate.  Denies N/V/CP/SOB/Abd pain. He reports his knee pain is better controlled today.  Extubated yesterday morning doing well. He reports he is hoarse but to be expected. He did excellent with PT yesterday.   Unfortunately his father passed away early this morning. He is doing okay. Eager for d/c home to be with his family during this time.  Objective:   VITALS:   Vitals:   11/03/22 0906 11/03/22 1000 11/03/22 1035 11/03/22 1100  BP:  113/80 113/80 (!) 147/90  Pulse:  (!) 57  64  Resp:  15  20  Temp: 97.8 F (36.6 C)     TempSrc: Oral     SpO2:  96%  98%  Weight:      Height:        Patient alert and oriented x3. NAD Neurologically intact ABD soft Neurovascular intact Sensation intact distally Intact pulses distally Dorsiflexion/Plantar flexion intact Incision: dressing C/D/I No cellulitis present Compartment soft   Lab Results  Component Value Date   WBC 22.1 (H) 11/03/2022   HGB 11.9 (L) 11/03/2022   HCT 34.3 (L) 11/03/2022   MCV 94.0 11/03/2022   PLT 276 11/03/2022   BMET    Component Value Date/Time   NA 134 (L) 11/03/2022 0409   K 3.4 (L) 11/03/2022 0409   CL 101 11/03/2022 0409   CO2 23 11/03/2022 0409   GLUCOSE 135 (H) 11/03/2022 0409   BUN 19 11/03/2022 0409   CREATININE 0.91 11/03/2022 0409   CALCIUM 9.1 11/03/2022 0409   GFRNONAA >60 11/03/2022 0409     Assessment/Plan: 2 Days Post-Op   Principal Problem:   Osteoarthritis of right knee Active Problems:   S/P TKR (total knee replacement), right   Anaphylactic shock   WBAT with walker DVT ppx: Lovenox, d/c on aspirin if okay with primary team. , SCDs, TEDS PO pain control PT/OT: Patient ambulated 280 feet with PT yesterday.   Dispo:  - Patient extubated yesterday morning. Pressors discontinued.  - Discharge per primary team. Patient hopeful for today.  - Patient to follow-up in office in 2 weeks for repeat eval right knee. Plan  for OPPT.    Clois Dupes, PA-C 11/03/2022, 12:32 PM   [facility]  [place] [address], [place], [place] [address] Phone: [phone] [email or web address] Facebook  [employer]

## 2022-11-03 NOTE — Progress Notes (Signed)
NAME:  Fernando [name], MRN:  [id number], DOB:  January 14, 1960, LOS: 2 ADMISSION DATE:  11/01/2022, CONSULTATION DATE:  10/23 REFERRING MD:  Dr. Linna Caprice, CHIEF COMPLAINT:   shock  History of Present Illness:  62 year old male with past medical history as below, which is significant for hypertension, anxiety, obstructive sleep apnea, COPD, renal mass, thoracic aortic aneurysm, and CKD.  He was admitted in June 2024 for acute pancreatitis and left against medical advice the following morning.  More recent medical course significant for right knee pain and functional disability due to arthritis who failed conservative treatments and presented for elective right total knee arthroplasty on 10/23.  The procedure was initially done with spinal anesthesia, however, midway through the surgery he abruptly became hypotensive and hypoxemic prompting intubation.  His skin became red and splotchy other concern for anaphylaxis.  He was started on epinephrine infusion and was initially difficult to oxygenate and remained hypotensive.  With ongoing resuscitation he did stabilize and was ultimately transferred to the ICU after the completion of the surgery.  PCCM is asked to consult for anaphylactic shock and hypoxic respiratory failure.  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   Significant Hospital Events: Including procedures, antibiotic start and stop dates in addition to other pertinent events   10/23 r knee  arthoplasty complicated by presumptive anaphylactic shock.  10/24 off pressors. Weaning on vent. Hives and redness gone.  10/25 overnight patient seen with increased restlessness and agitation with tachycardia and hypertension.  Concerns for benzodiazepine withdrawal coupled with situational anxiety given the loss of his father started on Precedex drip.  This a.m. completely alert and oriented on low-dose Precedex drip  Interim History / Subjective:  Alert and interactive this a.m.  Able to articulate all events of hospitalization including overnight events.  Reports utilizing 3 mg of Xanax nightly for sleep for last 10 years with 1 episode of withdrawal in the past  Objective   Blood pressure (!) 149/109, pulse (!) 125, temperature 97.8 F (36.6 C), temperature source Oral, resp. rate (!) 23, height 6\' 2"  (1.88 m), weight 123.9 kg, SpO2 95%.        Intake/Output Summary (Last 24 hours) at 11/03/2022 3875 Last data filed at 11/03/2022 6433 Gross per 24 hour  Intake 1042.22 ml  Output 2460 ml  Net -1417.78 ml   Filed Weights   11/01/22 0550 11/01/22 1146  Weight: 121.6 kg 123.9 kg    Examination: General: Acute on chronic ill-appearing middle-age male lying in bed in no acute distress HEENT: [place]/AT, MM pink/moist, PERRL,  Neuro: Alert and oriented x 3, nonfocal CV: s1s2 regular rate and rhythm, no murmur, rubs, or gallops,  PULM: Clear to auscultation bilaterally, no increased work of breathing, no added breath sounds GI: soft, bowel sounds active in all 4 quadrants, non-tender, non-distended, tolerating oral diet Extremities: warm/dry, no edema  Skin: no rashes or lesions   Resolved Hospital Problem list   Shock  -Presumably secondary to anaphylaxis. Unsure what the offending agent was. Did receive cefazolin pre-operatively.  Rash resolved a.m. 10/25 Acute respiratory failure with hypoxia: -Extubated 10/24 Acute metabolic encephalopathy -Alert  and oriented x 4, nonfocal a.m.  10/25 AKI  Assessment & Plan:  Patient remained stable this a.m. with much improved mentation.  Patient is requesting to be discharged today to be with family after the loss of his father overnight.  Medically he appears stable this a.m. Will plan to discontinue Precedex drip this a.m, resume home as needed 3 times daily Xanax and give one more dose of steroids.  If patient remains stable this afternoon will plan for discharge home with close follow-up with PCP and Ortho.   Arthritis of the R knee  -s/p total arthoplasty 10/23 P: Management for Ortho  Hypok hypophos P: IV K-Phos supplementation this a.m. Oral potassium supplementation as well   HTN P: Resume home Amlodipine and Olmesartan this am  Hold home lasix   Prediabetes  -A1C 5.8 P: As needed SSI    Best Practice (right click and "Reselect all SmartList Selections" daily)   Diet/type: NPO DVT prophylaxis: LMWH GI prophylaxis: H2B Lines: Arterial Line Foley:  N/A Code Status:  full code Last date of multidisciplinary goals of care discussion [ ]    Critical care time:    Laythan Hayter D. Harris, NP-C [facility] Personal contact information can be found on Amion  If no contact or response made please call [phone] 11/03/2022, 9:10 AM

## 2022-11-03 NOTE — Discharge Summary (Signed)
Physician Discharge Summary         Patient ID: Fernando [name] MRN: [id number] DOB/AGE: [birth year] 62 y.o.  Admit date: 11/01/2022 Discharge date: 11/03/2022  Discharge Diagnoses:   Shock  -Presumably secondary to anaphylaxis. Unsure what the offending agent was. Did receive cefazolin pre-operatively.  Rash resolved a.m. 10/25 Acute respiratory failure with hypoxia: -Extubated 10/24 Acute metabolic encephalopathy -Alert and oriented x 4, nonfocal a.m. 10/25 AKI  Arthritis of the R knee  Hypok hypophos HTN Prediabetes    Discharge summary   62 year old male with past medical history as below, which is significant for hypertension, anxiety, obstructive sleep apnea, COPD, renal mass, thoracic aortic aneurysm, and CKD.  He was admitted in June 2024 for acute pancreatitis and left against medical advice the following morning.  More recent medical course significant for right knee pain and functional disability due to arthritis who failed conservative treatments and presented for elective right total knee arthroplasty on 10/23. The procedure was initially done with spinal anesthesia, however, midway through the surgery he abruptly became hypotensive and hypoxemic prompting intubation.  His skin became red and splotchy other concern for anaphylaxis.  He was started on epinephrine infusion and was initially difficult to oxygenate and remained hypotensive.  With ongoing resuscitation he did stabilize and was ultimately transferred to the ICU after the completion of the surgery.  PCCM is asked to consult for anaphylactic shock and hypoxic respiratory failure.   Overnight of surgery patient remained on ventilator and epi drip.  Following day epi drip was discontinued and patient tolerated extubation.  Overnight 10/25 patient became increasingly agitated with tachycardia and hypertension likely multifactorial including early benzodiazepine withdrawal and situational anxiety given the loss of his  father.  By afternoon 10/25 patient was back to baseline mentation off Precedex drip.  He is ambulating per baseline and tolerating oral diet.  He was reevaluated in the afternoon and stable for discharge home.  Patient will need to follow-up with PCP and Ortho upon discharge.     Discharge Plan by Active Problems   Shock - Resolved afternoon 10/24  -Presumably secondary to anaphylaxis. Unsure what the offending agent was. Did receive cefazolin pre-operatively.  Rash resolved a.m. 10/25  Acute respiratory failure with hypoxia -Extubated 10/24  Acute metabolic encephalopathy -Alert and oriented x 4, nonfocal a.m.10/25  AKI - resolved 10/25   Arthritis of the R knee  -s/p total arthoplasty 10/23 P: Management for Ortho   Hypokalemia Hypophosphatemia  P: IV K-Phos supplementation and Oral potassium supplementation a.m.10/25 Will need to follow up with PCP for repeat labs early next week    HTN P: Resume home Amlodipine and Olmesartan this am  As needed lasix   Prediabetes  -A1C 5.8 P: Further management per PCP     Significant Hospital events/studies  10/23 r knee arthoplasty complicated by presumptive anaphylactic shock.  10/24 off pressors. Weaning on vent. Hives and redness gone.  10/25 overnight patient seen with increased restlessness and agitation with tachycardia and hypertension.  Concerns for benzodiazepine withdrawal coupled with situational anxiety given the loss of his father started on Precedex drip.  Stable for d/c by afternoon   Procedures   -S/P total arthoplasty 10/23  Culture data/antimicrobials   N/A   Consults  PCCM    Discharge Exam: BP (!) 147/90   Pulse 64   Temp 97.8 F (36.6 C) (Oral)   Resp 20   Ht 6\' 2"  (1.88 m)   Wt 123.9 kg   SpO2 98%  BMI 35.07 kg/m   General: Acute on chronic ill-appearing middle-age male lying in bed in no acute distress HEENT: [place]/AT, MM pink/moist, PERRL,  Neuro: Alert and oriented x 3, nonfocal CV:  s1s2 regular rate and rhythm, no murmur, rubs, or gallops,  PULM: Clear to auscultation bilaterally, no increased work of breathing, no added breath sounds GI: soft, bowel sounds active in all 4 quadrants, non-tender, non-distended, tolerating oral diet Extremities: warm/dry, no edema  Skin: no rashes or lesions  Labs at discharge   Lab Results  Component Value Date   CREATININE 0.91 11/03/2022   BUN 19 11/03/2022   NA 134 (L) 11/03/2022   K 3.4 (L) 11/03/2022   CL 101 11/03/2022   CO2 23 11/03/2022   Lab Results  Component Value Date   WBC 22.1 (H) 11/03/2022   HGB 11.9 (L) 11/03/2022   HCT 34.3 (L) 11/03/2022   MCV 94.0 11/03/2022   PLT 276 11/03/2022   Lab Results  Component Value Date   ALT 20 11/01/2022   AST 20 11/01/2022   ALKPHOS 66 11/01/2022   BILITOT 0.9 11/01/2022   Lab Results  Component Value Date   INR 0.98 04/30/2015   INR 1.02 04/22/2015   INR 0.97 08/16/2009    Current radiological studies    DG Chest 1 View  Result Date: 11/01/2022 CLINICAL DATA:  Status post central line placement EXAM: PORTABLE CHEST 1 VIEW COMPARISON:  11/01/2022 FINDINGS: Cardiac shadow is prominent but stable. Gastric catheter is noted within the stomach. Endotracheal tube is noted in satisfactory position. Left jugular central line is now seen with the catheter tip at the confluence of the innominate veins. No pneumothorax is seen. The overall inspiratory effort is poor. Stable left basilar atelectasis is noted. IMPRESSION: No pneumothorax following central line placement as described. Tubes and lines stable from the prior study. Stable left basilar atelectasis is seen. Electronically Signed   By: Alcide Clever M.D.   On: 11/01/2022 18:37   Rapid EEG  Result Date: 11/01/2022 Charlsie Quest, MD     11/02/2022 10:33 AM Patient Name: Fernando [name] MRN: [id number] Epilepsy Attending: Charlsie Quest Referring Physician/Provider: Duayne Cal, NP Date: 11/01/2022 Duration: 2  hoours 5 mins Patient history: 62yo   with ams getting eeg to evaluate for seizure Level of alertness: lethargic AEDs during EEG study: None Technical aspects: This EEG was obtained using a 10 lead EEG system positioned circumferentially without any parasagittal coverage (rapid EEG). Computer selected EEG is reviewed as  well as background features and all clinically significant events. Description: EEG showed an excessive amount of 15 to 18 Hz beta activity distributed symmetrically and diffusely. NO clear posterior dominant rhythm was seen. Hyperventilation and photic stimulation were not performed.   ABNORMALITY - Excessive beta, generalized IMPRESSION: This limited ceribell EEG is within normal limits. The excessive beta activity seen in the background is most likely due to the effect of medications like benzodiazepine and is a benign EEG pattern. No seizures or epileptiform discharges were seen throughout the recording. Charlsie Quest    Disposition:  Home     Discharge Instructions     Call MD / Call 911   Complete by: As directed    If you experience chest pain or shortness of breath, CALL 911 and be transported to the hospital emergency room.  If you develope a fever above 101 F, pus (white drainage) or increased drainage or redness at the wound, or calf pain, call  your surgeon's office.   Change dressing   Complete by: As directed    Do not remove your dressing.   Constipation Prevention   Complete by: As directed    Drink plenty of fluids.  Prune juice may be helpful.  You may use a stool softener, such as Colace (over the counter) 100 mg twice a day.  Use MiraLax (over the counter) for constipation as needed.   Diet - low sodium heart healthy   Complete by: As directed    Discharge instructions   Complete by: As directed    Elevate toes above nose. Use cryotherapy as needed for pain and swelling.   Do not put a pillow under the knee. Place it under the heel.   Complete by: As  directed    Driving restrictions   Complete by: As directed    No driving for 6 weeks   Increase activity slowly as tolerated   Complete by: As directed    Lifting restrictions   Complete by: As directed    No lifting for 6 weeks   Post-operative opioid taper instructions:   Complete by: As directed    POST-OPERATIVE OPIOID TAPER INSTRUCTIONS: It is important to wean off of your opioid medication as soon as possible. If you do not need pain medication after your surgery it is ok to stop day one. Opioids include: Codeine, Hydrocodone(Norco, Vicodin), Oxycodone(Percocet, oxycontin) and hydromorphone amongst others.  Long term and even short term use of opiods can cause: Increased pain response Dependence Constipation Depression Respiratory depression And more.  Withdrawal symptoms can include Flu like symptoms Nausea, vomiting And more Techniques to manage these symptoms Hydrate well Eat regular healthy meals Stay active Use relaxation techniques(deep breathing, meditating, yoga) Do Not substitute Alcohol to help with tapering If you have been on opioids for less than two weeks and do not have pain than it is ok to stop all together.  Plan to wean off of opioids This plan should start within one week post op of your joint replacement. Maintain the same interval or time between taking each dose and first decrease the dose.  Cut the total daily intake of opioids by one tablet each day Next start to increase the time between doses. The last dose that should be eliminated is the evening dose.      TED hose   Complete by: As directed    Use stockings (TED hose) for 2 weeks on both leg(s).  You may remove them at night for sleeping.   Weight bearing as tolerated   Complete by: As directed        Allergies as of 11/03/2022       Reactions   Ancef [cefazolin] Anaphylaxis   Morphine And Codeine Other (See Comments)   hallucinations        Medication List     STOP  taking these medications    oxyCODONE-acetaminophen 5-325 MG tablet Commonly known as: PERCOCET/ROXICET       TAKE these medications    acetaminophen 500 MG tablet Commonly known as: TYLENOL Take 1,000 mg by mouth every 6 (six) hours as needed for moderate pain.   ALPRAZolam 1 MG tablet Commonly known as: XANAX Take 1 mg by mouth in the morning, at noon, and at bedtime.   amLODipine 5 MG tablet Commonly known as: NORVASC Take 1 tablet (5 mg total) by mouth daily.   aspirin 81 MG chewable tablet Commonly known as: Aspirin Childrens Chew 1 tablet (81  mg total) by mouth 2 (two) times daily with a meal.   docusate sodium 100 MG capsule Commonly known as: Colace Take 1 capsule (100 mg total) by mouth 2 (two) times daily.   furosemide 20 MG tablet Commonly known as: LASIX Take 20 mg by mouth daily as needed for edema or fluid.   ibuprofen 200 MG tablet Commonly known as: ADVIL Take 400 mg by mouth every 6 (six) hours as needed for moderate pain.   methocarbamol 500 MG tablet Commonly known as: ROBAXIN Take 1 tablet (500 mg total) by mouth every 6 (six) hours as needed.   MULTI VITAMIN PO Take 1 tablet by mouth daily.   olmesartan 20 MG tablet Commonly known as: BENICAR Take 20 mg by mouth daily.   ondansetron 4 MG tablet Commonly known as: Zofran Take 1 tablet (4 mg total) by mouth every 8 (eight) hours as needed for nausea or vomiting.   oxyCODONE 5 MG immediate release tablet Commonly known as: Roxicodone Take 1 tablet (5 mg total) by mouth every 4 (four) hours as needed for severe pain (pain score 7-10).   pantoprazole 40 MG tablet Commonly known as: Protonix Take 1 tablet (40 mg total) by mouth 2 (two) times daily. What changed: how much to take   polyethylene glycol 17 g packet Commonly known as: MiraLax Take 17 g by mouth daily as needed for mild constipation or moderate constipation.   potassium chloride SA 20 MEQ tablet Commonly known as: KLOR-CON  M Take 20 mEq by mouth daily as needed (cramping).   predniSONE 10 MG tablet Commonly known as: DELTASONE Take 4 tablets (40 mg total) by mouth daily for 3 days.   senna 8.6 MG Tabs tablet Commonly known as: SENOKOT Take 2 tablets (17.2 mg total) by mouth at bedtime for 15 days.               Discharge Care Instructions  (From admission, onward)           Start     Ordered   11/01/22 0000  Weight bearing as tolerated        11/01/22 0717   11/01/22 0000  Change dressing       Comments: Do not remove your dressing.   11/01/22 0717             Follow-up appointment   PCP Ortho   Discharge Condition:    stable  Signed: Tamerra Merkley D. Harris 11/03/2022, 12:52 PM

## 2022-11-03 NOTE — Progress Notes (Signed)
eLink Physician-Brief Progress Note Patient Name: Fernando [name] DOB: 1960-11-30 MRN: [id number]   Date of Service  11/03/2022  HPI/Events of Note  Patient with anxiety and tachycardia secondary to being off Xanax, he was given 1 mg of Xanax , and when symptoms persisted he was started on Precedex gtt to augment anxiolysis without respiratory depression, given recent extubation, and ongoing concern about post-extubation airway edema / hoarseness.  eICU Interventions  See above.        [name] 11/03/2022, 5:20 AM

## 2022-11-03 NOTE — Progress Notes (Signed)
Pts father passed away tonight.  He is wanting to be discharged and refusing to let us obtain an arterial blood gas at this time.

## 2022-11-03 NOTE — Hospital Course (Addendum)
62 YOMw/ hypertension, anxiety, obstructive sleep apnea, COPD, renal mass, thoracic aortic aneurysm, and CKD I who was admitted in June 2024 for acute pancreatitis and left against medical advice  and more recently medical course significant for right knee pain and functional disability due to arthritis who failed conservative treatments and presented for elective right total knee arthroplasty on 11/01/22, initially done with spinal anesthesia, however, midway through the surgery he abruptly became hypotensive and hypoxemic prompting intubation. His skin became red and splotchy other concern for anaphylaxis. He was started on epinephrine infusion and was initially difficult to oxygenate and remained hypotensive. With ongoing resuscitation he did stabilize and was ultimately transferred to the ICU after the completion of the surgery.  10/23 r knee arthoplasty complicated by presumptive anaphylactic shock.  10/24 off pressors. Weaned off vent. 10.25 Transferred under [facility] service  Has been tachycardic, afebrile BP stable on room air Labs with mild hypokalemia leukocytosis trending up to 22.1.  Bilirubin has been benzo withdrawal and his Xanax was resumed 10/25 am and started on Precedex.

## 2022-11-03 NOTE — TOC Transition Note (Signed)
Transition of Care [facility]) - CM/SW Discharge Note  Patient Details  Name: Fernando [name] MRN: [id number] Date of Birth: October 12, 1960  [facility]) CM/SW Contact:  Ewing Schlein, LCSW Phone Number: 11/03/2022, 1:41 PM  Clinical Narrative: CSW met with patient to confirm discharge plan. Patient will go home with OPPT at [facility] in [place]. Patient has a rolling walker and walker with platforms at home, so there are no DME needs at this time. TOC signing off.    Final next level of care: OP Rehab Barriers to Discharge: Barriers Resolved  Patient Goals and CMS Choice Choice offered to / list presented to : NA  Discharge Plan and Services Additional resources added to the After Visit Summary for   In-house Referral: Clinical Social Work Post Acute Care Choice: NA          DME Arranged: N/A DME Agency: NA  Social Determinants of Health (SDOH) Interventions SDOH Screenings   Food Insecurity: No Food Insecurity (11/02/2022)  Housing: Medium Risk (11/02/2022)  Transportation Needs: No Transportation Needs (11/02/2022)  Utilities: Not At Risk (11/02/2022)  Financial Resource Strain: Low Risk  (03/24/2022)   Received from [facility], [facility]  Social Connections: Unknown (06/05/2021)   Received from [facility], [facility]  Tobacco Use: High Risk (11/01/2022)   Readmission Risk Interventions     No data to display

## 2022-11-04 ENCOUNTER — Other Ambulatory Visit: Payer: Self-pay

## 2022-11-04 ENCOUNTER — Encounter (HOSPITAL_COMMUNITY): Payer: Self-pay | Admitting: *Deleted

## 2022-11-04 ENCOUNTER — Emergency Department (HOSPITAL_COMMUNITY)
Admission: EM | Admit: 2022-11-04 | Discharge: 2022-11-04 | Disposition: A | Discharge status: Home / Self Care | Payer: Medicaid Other

## 2022-11-04 ENCOUNTER — Emergency Department (HOSPITAL_COMMUNITY): Payer: Medicaid Other

## 2022-11-04 DIAGNOSIS — J449 Chronic obstructive pulmonary disease, unspecified: Secondary | ICD-10-CM | POA: Insufficient documentation

## 2022-11-04 DIAGNOSIS — Z85528 Personal history of other malignant neoplasm of kidney: Secondary | ICD-10-CM | POA: Insufficient documentation

## 2022-11-04 DIAGNOSIS — M25561 Pain in right knee: Secondary | ICD-10-CM | POA: Diagnosis not present

## 2022-11-04 DIAGNOSIS — Z79899 Other long term (current) drug therapy: Secondary | ICD-10-CM | POA: Insufficient documentation

## 2022-11-04 DIAGNOSIS — G8918 Other acute postprocedural pain: Secondary | ICD-10-CM | POA: Diagnosis present

## 2022-11-04 DIAGNOSIS — Z7951 Long term (current) use of inhaled steroids: Secondary | ICD-10-CM | POA: Insufficient documentation

## 2022-11-04 DIAGNOSIS — M7989 Other specified soft tissue disorders: Secondary | ICD-10-CM | POA: Diagnosis not present

## 2022-11-04 DIAGNOSIS — N189 Chronic kidney disease, unspecified: Secondary | ICD-10-CM | POA: Diagnosis not present

## 2022-11-04 DIAGNOSIS — Z7982 Long term (current) use of aspirin: Secondary | ICD-10-CM | POA: Insufficient documentation

## 2022-11-04 DIAGNOSIS — Z96651 Presence of right artificial knee joint: Secondary | ICD-10-CM | POA: Insufficient documentation

## 2022-11-04 DIAGNOSIS — I129 Hypertensive chronic kidney disease with stage 1 through stage 4 chronic kidney disease, or unspecified chronic kidney disease: Secondary | ICD-10-CM | POA: Insufficient documentation

## 2022-11-04 LAB — BASIC METABOLIC PANEL
Anion gap: 11 (ref 5–15)
BUN: 21 mg/dL — ABNORMAL HIGH (ref 6–20)
CO2: 26 mmol/L (ref 22–32)
Calcium: 8.6 mg/dL — ABNORMAL LOW (ref 8.9–10.3)
Chloride: 101 mmol/L (ref 98–111)
Creatinine, Ser: 1.02 mg/dL (ref 0.61–1.24)
GFR, Estimated: >60 mL/min (ref >60)
Glucose, Bld: 103 mg/dL — ABNORMAL HIGH (ref 70–99)
Potassium: 3.4 mmol/L — ABNORMAL LOW (ref 3.5–5.1)
Sodium: 138 mmol/L (ref 135–145)

## 2022-11-04 LAB — LACTIC ACID, PLASMA
Lactic Acid, Venous: 1.3 mmol/L (ref 0.5–1.9)
Lactic Acid, Venous: 1 mmol/L (ref 0.5–1.9)

## 2022-11-04 LAB — CBC WITH DIFFERENTIAL/PLATELET
Abs Immature Granulocytes: 0.06 10*3/uL (ref 0.00–0.07)
Basophils Absolute: 0 10*3/uL (ref 0.0–0.1)
Basophils Relative: 0 %
Eosinophils Absolute: 0 10*3/uL (ref 0.0–0.5)
Eosinophils Relative: 0 %
HCT: 34.7 % — ABNORMAL LOW (ref 39.0–52.0)
Hemoglobin: 12 g/dL — ABNORMAL LOW (ref 13.0–17.0)
Immature Granulocytes: 1 %
Lymphocytes Relative: 24 %
Lymphs Abs: 2.9 10*3/uL (ref 0.7–4.0)
MCH: 32.9 pg (ref 26.0–34.0)
MCHC: 34.6 g/dL (ref 30.0–36.0)
MCV: 95.1 fL (ref 80.0–100.0)
Monocytes Absolute: 1.3 10*3/uL — ABNORMAL HIGH (ref 0.1–1.0)
Monocytes Relative: 11 %
Neutro Abs: 7.7 10*3/uL (ref 1.7–7.7)
Neutrophils Relative %: 64 %
Platelets: 282 10*3/uL (ref 150–400)
RBC: 3.65 MIL/uL — ABNORMAL LOW (ref 4.22–5.81)
RDW: 13 % (ref 11.5–15.5)
WBC: 11.9 10*3/uL — ABNORMAL HIGH (ref 4.0–10.5)
nRBC: 0 % (ref 0.0–0.2)

## 2022-11-04 LAB — C-REACTIVE PROTEIN: CRP: 2.6 mg/dL — ABNORMAL HIGH (ref <1.0)

## 2022-11-04 LAB — SEDIMENTATION RATE: Sed Rate: 40 mm/h — ABNORMAL HIGH (ref 0–16)

## 2022-11-04 MED ORDER — FENTANYL CITRATE PF 50 MCG/ML IJ SOSY
25.0000 ug | PREFILLED_SYRINGE | Freq: Once | INTRAMUSCULAR | Status: AC
  Administered 2022-11-04: 25 ug via INTRAVENOUS
  Filled 2022-11-04: qty 1

## 2022-11-04 MED ORDER — KETOROLAC TROMETHAMINE 10 MG PO TABS: 10.0000 mg | ORAL_TABLET | Freq: Four times a day (QID) | ORAL | 0 refills | Status: AC | PRN

## 2022-11-04 MED ORDER — CYCLOBENZAPRINE HCL 10 MG PO TABS
10.0000 mg | ORAL_TABLET | Freq: Two times a day (BID) | ORAL | 0 refills | Status: AC | PRN
Start: 2022-11-04 — End: ?

## 2022-11-04 MED ORDER — HYDROMORPHONE HCL 1 MG/ML IJ SOLN
1.0000 mg | Freq: Once | INTRAMUSCULAR | Status: AC
  Administered 2022-11-04: 1 mg via INTRAVENOUS
  Filled 2022-11-04: qty 1

## 2022-11-04 MED ORDER — KETOROLAC TROMETHAMINE 30 MG/ML IJ SOLN
30.0000 mg | Freq: Once | INTRAMUSCULAR | Status: AC
  Administered 2022-11-04: 30 mg via INTRAVENOUS
  Filled 2022-11-04: qty 1

## 2022-11-04 MED ORDER — CYCLOBENZAPRINE HCL 10 MG PO TABS
10.0000 mg | ORAL_TABLET | Freq: Once | ORAL | Status: AC
  Administered 2022-11-04: 10 mg via ORAL
  Filled 2022-11-04: qty 1

## 2022-11-04 NOTE — Discharge Instructions (Addendum)
Please take your pain medications as prescribed. If you have stomach upset, please stop taking Toradol. I recommend close follow-up with your orthopedics for reevaluation.  Please do not hesitate to return to emergency department if worrisome signs symptoms we discussed become apparent.

## 2022-11-04 NOTE — ED Triage Notes (Signed)
Pt with redness and hot to touch to right knee, had surgery on Wednesday.  Pt was discharged yesterday after having anaphylaxis from Ancef.  Pt is SOB in triage. Severe pain to right knee.

## 2022-11-04 NOTE — ED Provider Notes (Signed)
[place] EMERGENCY DEPARTMENT AT [facility] Provider Note   CSN: [id number] Arrival date & time: 11/04/22  1635     History  Chief Complaint  Patient presents with   Post-op Problem    Fernando [name] is a 62 y.o. male with a past medical history of anxiety, kidney cancer, COPD, hypertension presents today for evaluation of knee pain.  Patient recently got a total knee replacement on 10/23 by Dr. Garlon Hatchet.  Patient was admitted to the ICU for 2 days due to having of anaphylactic reaction to Ancef during the surgery.  Patient was discharged home yesterday.  He returned to the ER due to having increased pain and swelling and redness to his right knee.  Patient is still nonweightbearing.  He denies any fever.  Patient has been taking oxycodone 5 mg every 4 hours with no relief.  HPI  Past Medical History:  Diagnosis Date   Acute respiratory failure with hypoxia (HCC) 08/27/2015   Mild-Oxygen saturation 88% with ambulation.   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   Tobacco abuse    Umbilical hernia    has been repaired   Past Surgical History:  Procedure Laterality Date   ABSCESS DRAINAGE     APPENDECTOMY     BIOPSY  07/24/2022   Procedure: BIOPSY;  Surgeon: Lanelle Bal, DO;  Location: [facility];  Service: Endoscopy;;   CERVICAL FUSION     COLONOSCOPY     CYSTOSCOPY N/A 05/01/2013   Procedure: CYSTOSCOPY;  Surgeon: Ky Barban, MD;   Location: [facility] ORS;  Service: Urology;  Laterality: N/A;   ESOPHAGOGASTRODUODENOSCOPY (EGD) WITH PROPOFOL N/A 07/24/2022   Procedure: ESOPHAGOGASTRODUODENOSCOPY (EGD) WITH PROPOFOL;  Surgeon: Lanelle Bal, DO;  Location: [facility];  Service: Endoscopy;  Laterality: N/A;  8:00am, asa 3   EVALUATION UNDER ANESTHESIA WITH FISTULECTOMY N/A 06/23/2015   Procedure: ANAL EXAM UNDER ANESTHESIA  FISTULOTOMY;  Surgeon: Romie Levee, MD;  Location: [facility];  Service: General;  Laterality: N/A;   FACIAL COSMETIC SURGERY     HERNIA REPAIR     INCISION AND DRAINAGE ABSCESS Right 05/01/2013   Procedure: INCISION AND DRAINAGE RIGHT SCROTAL ABSCESS;  Surgeon: Ky Barban, MD;  Location: [facility] ORS;  Service: Urology;  Laterality: Right;   INCISION AND DRAINAGE PERIRECTAL ABSCESS     KNEE ARTHROPLASTY Right 11/01/2022   Procedure: COMPUTER ASSISTED TOTAL KNEE ARTHROPLASTY;  Surgeon: Samson Frederic, MD;  Location: [facility] ORS;  Service: Orthopedics;  Laterality: Right;  160 flip room   KNEE ARTHROSCOPY     SPINE SURGERY     cervical   VIDEO ASSISTED THORACOSCOPY (VATS)/ LOBECTOMY Left 05/03/2015   Procedure: LEFT VIDEO ASSISTED THORACOSCOPY (VATS)/ LEFT LOWER LOBECTOMY, ON-Q CATHETER PLACEMENT;  Surgeon: Loreli Slot, MD;  Location: [facility]  OR;  Service: Thoracic;  Laterality: Left;   VIDEO BRONCHOSCOPY WITH ENDOBRONCHIAL ULTRASOUND N/A 04/22/2015   Procedure: VIDEO BRONCHOSCOPY WITH ENDOBRONCHIAL ULTRASOUND;  Surgeon: Loreli Slot, MD;  Location: [facility] OR;  Service: Thoracic;  Laterality: N/A;     Home Medications Prior to Admission medications   Medication Sig Start Date End Date Taking? Authorizing Provider  acetaminophen (TYLENOL) 500 MG tablet Take 1,000 mg by mouth every 6 (six) hours as needed for moderate pain.    [provider]  ALPRAZolam Prudy Feeler) 1 MG tablet Take 1 mg by mouth in the morning, at noon, and at bedtime.    [provider]  amLODipine  (NORVASC) 5 MG tablet Take 1 tablet (5 mg total) by mouth daily. 06/20/22   Roemhildt, Lorin T, PA-C  aspirin (ASPIRIN CHILDRENS) 81 MG chewable tablet Chew 1 tablet (81 mg total) by mouth 2 (two) times daily with a meal. 11/01/22 12/16/22  Hill, Alain Honey, PA-C  docusate sodium (COLACE) 100 MG capsule Take 1 capsule (100 mg total) by mouth 2 (two) times daily. 11/01/22 12/01/22  Clois Dupes, PA-C  furosemide (LASIX) 20 MG tablet Take 20 mg by mouth daily as needed for edema or fluid.    [provider]  ibuprofen (ADVIL) 200 MG tablet Take 400 mg by mouth every 6 (six) hours as needed for moderate pain.    [provider]  methocarbamol (ROBAXIN) 500 MG tablet Take 1 tablet (500 mg total) by mouth every 6 (six) hours as needed. 11/01/22   Clois Dupes, PA-C  Multiple Vitamin (MULTI VITAMIN PO) Take 1 tablet by mouth daily.    [provider]  olmesartan (BENICAR) 20 MG tablet Take 20 mg by mouth daily. 09/28/22   [provider]  ondansetron (ZOFRAN) 4 MG tablet Take 1 tablet (4 mg total) by mouth every 8 (eight) hours as needed for nausea or vomiting. 11/01/22 11/01/23  Clois Dupes, PA-C  oxyCODONE (ROXICODONE) 5 MG immediate release tablet Take 1 tablet (5 mg total) by mouth every 4 (four) hours as needed for severe pain (pain score 7-10). 11/01/22   Hill, Alain Honey, PA-C  pantoprazole (PROTONIX) 40 MG tablet Take 1 tablet (40 mg total) by mouth 2 (two) times daily. Patient taking differently: Take 20 mg by mouth 2 (two) times daily. 07/24/22 07/24/23  Lanelle Bal, DO  polyethylene glycol (MIRALAX) 17 g packet Take 17 g by mouth daily as needed for mild constipation or moderate constipation. 11/01/22 12/01/22  Clois Dupes, PA-C  potassium chloride SA (KLOR-CON M) 20 MEQ tablet Take 20 mEq by mouth daily as needed (cramping).    [provider]  predniSONE (DELTASONE) 10 MG tablet Take 4 tablets (40 mg total) by mouth daily for 3 days. 11/03/22  11/06/22  Janeann Forehand D, NP  senna (SENOKOT) 8.6 MG TABS tablet Take 2 tablets (17.2 mg total) by mouth at bedtime for 15 days. 11/01/22 11/16/22  Clois Dupes, PA-C      Allergies    Ancef [cefazolin] and Morphine and codeine    Review of Systems   Review of Systems Negative except as per HPI.  Physical Exam Updated Vital Signs BP 124/82   Pulse 82   Temp 98.1 F (36.7 C) (Oral)   Resp 14   Ht 6\' 2"  (1.88 m)   Wt 123.9 kg   SpO2 95%   BMI 35.07 kg/m  Physical Exam Vitals and nursing note reviewed.  Constitutional:  Appearance: Normal appearance.  HENT:     Head: Normocephalic and atraumatic.     Mouth/Throat:     Mouth: Mucous membranes are moist.  Eyes:     General: No scleral icterus. Cardiovascular:     Rate and Rhythm: Normal rate and regular rhythm.     Pulses: Normal pulses.     Heart sounds: Normal heart sounds.  Pulmonary:     Effort: Pulmonary effort is normal.     Breath sounds: Normal breath sounds.  Abdominal:     General: Abdomen is flat.     Palpations: Abdomen is soft.     Tenderness: There is no abdominal tenderness.  Musculoskeletal:        General: No deformity.     Comments: Right knee is postop.  There was swelling, redness and skin is warm to touch to the right knee.  Skin:    General: Skin is warm.     Findings: No rash.  Neurological:     General: No focal deficit present.     Mental Status: He is alert.  Psychiatric:        Mood and Affect: Mood normal.    ED Results / Procedures / Treatments   Labs (all labs ordered are listed, but only abnormal results are displayed) Labs Reviewed  CBC WITH DIFFERENTIAL/PLATELET  BASIC METABOLIC PANEL  SEDIMENTATION RATE  C-REACTIVE PROTEIN    EKG EKG Interpretation Date/Time:  Saturday November 04 2022 16:48:41 EDT Ventricular Rate:  92 PR Interval:  136 QRS Duration:  84 QT Interval:  342 QTC Calculation: 422 R Axis:   -20  Text Interpretation: Sinus rhythm with marked  sinus arrhythmia Inferior infarct , age undetermined Abnormal ECG When compared with ECG of 03-Nov-2022 05:06, Fusion complexes are no longer Present Premature ventricular complexes are no longer Present Inferior infarct is now Present ST no longer elevated in Inferior leads Confirmed by Estanislado Pandy [phone]) on 11/04/2022 5:16:53 PM  Radiology No results found.  Procedures Procedures    Medications Ordered in ED Medications  HYDROmorphone (DILAUDID) injection 1 mg (has no administration in time range)    ED Course/ Medical Decision Making/ A&P                                 Medical Decision Making Amount and/or Complexity of Data Reviewed Labs: ordered. Radiology: ordered.  Risk Prescription drug management.   This patient presents to the ED for knee pain, this involves an extensive number of treatment options, and is a complaint that carries with a high risk of complications and morbidity.  The differential diagnosis includes fracture, dislocation, septic joint, postop complication.  This is not an exhaustive list.  Lab tests: I ordered and personally interpreted labs.  The pertinent results include: WBC 11.9. Hbg unremarkable. Platelets unremarkable. Electrolytes unremarkable. BUN, creatinine unremarkable.  Lactic acid 1.0.  Sed rate is 40.  Imaging studies: I ordered imaging studies, personally reviewed, interpreted imaging and agree with the radiologist's interpretations. The results include: 1. Status post RIGHT knee arthroplasty with a moderate joint effusion. 2. There is a favored overall decreased volume of soft tissue air in the joint space compared to immediate postoperative film and a favored slightly increased amount in the anterior superficial soft tissues. This is nonspecific. If concern for necrotizing fasciitis, recommend correlation with physical exam as this is a clinical diagnosis.  Problem list/ ED course/ Critical interventions/ Medical  management:  HPI: See above Vital signs within normal range and stable throughout visit. Laboratory/imaging studies significant for: See above. On physical examination, patient is afebrile and appears in no acute distress.  There was swelling, redness to the right knee and skin is warm to touch.  X-ray of the right knee showed as above.  Given workup I have low suspicion for fracture, dislocation, significant ligamentous injury, septic arthritis, gout flare. Will consult orthopedics. I have reviewed the patient home medicines and have made adjustments as needed.  Cardiac monitoring/EKG: The patient was maintained on a cardiac monitor.  I personally reviewed and interpreted the cardiac monitor which showed an underlying rhythm of: sinus rhythm.  Additional history obtained: External records from outside source obtained and reviewed including: Chart review including previous notes, labs, imaging.  Consultations obtained: I spoke to Dr. Aundria Rud orthopedics.  He recommended pain control, adding Toradol and Flexeril to the regimen.  Patient can follow-up with orthopedics outpatient if pain is controled.  Disposition Continued outpatient therapy. Follow-up with orthopedics recommended for reevaluation of symptoms. Treatment plan discussed with patient.  Pt acknowledged understanding was agreeable to the plan. Worrisome signs and symptoms were discussed with patient, and patient acknowledged understanding to return to the ED if they noticed these signs and symptoms. Patient was stable upon discharge.   This chart was dictated using voice recognition software.  Despite best efforts to proofread,  errors can occur which can change the documentation meaning.          Final Clinical Impression(s) / ED Diagnoses Final diagnoses:  Post-operative pain    Rx / DC Orders ED Discharge Orders          Ordered    cyclobenzaprine (FLEXERIL) 10 MG tablet  2 times daily PRN        11/04/22 2128     ketorolac (TORADOL) 10 MG tablet  Every 6 hours PRN        11/04/22 2128              Jeanelle Malling, PA 11/04/22 2135    Coral Spikes, DO 11/05/22 0021

## 2022-11-05 LAB — TRYPTASE: Tryptase: 3.4 ug/L (ref 2.2–13.2)

## 2022-11-06 NOTE — Progress Notes (Signed)
To clarify AKI diagnosis. Patient has worsening shock with serum creatinine climbing to 1.28 from baseline ~ 1.

## 2022-11-09 LAB — CULTURE, BLOOD (ROUTINE X 2)
Culture: NO GROWTH
Culture: NO GROWTH

## 2023-02-28 ENCOUNTER — Emergency Department (HOSPITAL_COMMUNITY)
Admission: EM | Admit: 2023-02-28 | Discharge: 2023-02-28 | Discharge status: Against Medical Advice | Payer: Medicaid Other | Attending: Emergency Medicine | Admitting: Emergency Medicine

## 2023-02-28 ENCOUNTER — Emergency Department (HOSPITAL_COMMUNITY): Payer: Medicaid Other

## 2023-02-28 ENCOUNTER — Other Ambulatory Visit: Payer: Self-pay

## 2023-02-28 ENCOUNTER — Encounter (HOSPITAL_COMMUNITY): Payer: Self-pay

## 2023-02-28 DIAGNOSIS — R059 Cough, unspecified: Secondary | ICD-10-CM | POA: Insufficient documentation

## 2023-02-28 DIAGNOSIS — Z5321 Procedure and treatment not carried out due to patient leaving prior to being seen by health care provider: Secondary | ICD-10-CM | POA: Diagnosis not present

## 2023-02-28 LAB — RESP PANEL BY RT-PCR (RSV, FLU A&B, COVID)  RVPGX2
Influenza A by PCR: NEGATIVE
Influenza B by PCR: NEGATIVE
Resp Syncytial Virus by PCR: NEGATIVE
SARS Coronavirus 2 by RT PCR: NEGATIVE

## 2023-02-28 NOTE — ED Triage Notes (Signed)
Pt started feeling bad on Sunday with cough, no fever, weakness, runny nose.

## 2023-03-14 ENCOUNTER — Ambulatory Visit: Payer: MEDICAID | Admitting: Cardiology

## 2023-03-19 ENCOUNTER — Other Ambulatory Visit: Payer: Self-pay

## 2023-03-19 ENCOUNTER — Emergency Department (HOSPITAL_COMMUNITY): Payer: MEDICAID

## 2023-03-19 ENCOUNTER — Encounter (HOSPITAL_COMMUNITY): Payer: Self-pay

## 2023-03-19 ENCOUNTER — Emergency Department (HOSPITAL_COMMUNITY)
Admission: EM | Admit: 2023-03-19 | Discharge: 2023-03-20 | Disposition: A | Discharge status: Home / Self Care | Payer: MEDICAID | Attending: Emergency Medicine | Admitting: Emergency Medicine

## 2023-03-19 DIAGNOSIS — R519 Headache, unspecified: Secondary | ICD-10-CM | POA: Diagnosis present

## 2023-03-19 DIAGNOSIS — R059 Cough, unspecified: Secondary | ICD-10-CM | POA: Diagnosis not present

## 2023-03-19 DIAGNOSIS — R202 Paresthesia of skin: Secondary | ICD-10-CM | POA: Insufficient documentation

## 2023-03-19 DIAGNOSIS — M542 Cervicalgia: Secondary | ICD-10-CM | POA: Insufficient documentation

## 2023-03-19 LAB — CBC WITH DIFFERENTIAL/PLATELET
Abs Immature Granulocytes: 0.04 10*3/uL (ref 0.00–0.07)
Basophils Absolute: 0 10*3/uL (ref 0.0–0.1)
Basophils Relative: 0 %
Eosinophils Absolute: 0.3 10*3/uL (ref 0.0–0.5)
Eosinophils Relative: 3 %
HCT: 43.3 % (ref 39.0–52.0)
Hemoglobin: 15 g/dL (ref 13.0–17.0)
Immature Granulocytes: 0 %
Lymphocytes Relative: 20 %
Lymphs Abs: 1.9 10*3/uL (ref 0.7–4.0)
MCH: 31.6 pg (ref 26.0–34.0)
MCHC: 34.6 g/dL (ref 30.0–36.0)
MCV: 91.2 fL (ref 80.0–100.0)
Monocytes Absolute: 1 10*3/uL (ref 0.1–1.0)
Monocytes Relative: 11 %
Neutro Abs: 6 10*3/uL (ref 1.7–7.7)
Neutrophils Relative %: 66 %
Platelets: 294 10*3/uL (ref 150–400)
RBC: 4.75 MIL/uL (ref 4.22–5.81)
RDW: 14.9 % (ref 11.5–15.5)
WBC: 9.3 10*3/uL (ref 4.0–10.5)
nRBC: 0 % (ref 0.0–0.2)

## 2023-03-19 LAB — COMPREHENSIVE METABOLIC PANEL
ALT: 32 U/L (ref 0–44)
AST: 39 U/L (ref 15–41)
Albumin: 4 g/dL (ref 3.5–5.0)
Alkaline Phosphatase: 96 U/L (ref 38–126)
Anion gap: 12 (ref 5–15)
BUN: 17 mg/dL (ref 8–23)
CO2: 26 mmol/L (ref 22–32)
Calcium: 9.2 mg/dL (ref 8.9–10.3)
Chloride: 96 mmol/L — ABNORMAL LOW (ref 98–111)
Creatinine, Ser: 1.13 mg/dL (ref 0.61–1.24)
GFR, Estimated: >60 mL/min (ref >60)
Glucose, Bld: 95 mg/dL (ref 70–99)
Potassium: 3.2 mmol/L — ABNORMAL LOW (ref 3.5–5.1)
Sodium: 134 mmol/L — ABNORMAL LOW (ref 135–145)
Total Bilirubin: 1 mg/dL (ref 0.0–1.2)
Total Protein: 8.2 g/dL — ABNORMAL HIGH (ref 6.5–8.1)

## 2023-03-19 MED ORDER — METOCLOPRAMIDE HCL 5 MG/ML IJ SOLN
10.0000 mg | Freq: Once | INTRAMUSCULAR | Status: AC
  Administered 2023-03-19: 10 mg via INTRAVENOUS
  Filled 2023-03-19: qty 2

## 2023-03-19 MED ORDER — FENTANYL CITRATE PF 50 MCG/ML IJ SOSY
50.0000 ug | PREFILLED_SYRINGE | Freq: Once | INTRAMUSCULAR | Status: AC
Start: 2023-03-19 — End: 2023-03-19
  Administered 2023-03-19: 50 ug via INTRAVENOUS
  Filled 2023-03-19: qty 1

## 2023-03-19 MED ORDER — KETOROLAC TROMETHAMINE 15 MG/ML IJ SOLN
15.0000 mg | Freq: Once | INTRAMUSCULAR | Status: AC
  Administered 2023-03-19: 15 mg via INTRAVENOUS
  Filled 2023-03-19: qty 1

## 2023-03-19 MED ORDER — IOHEXOL 350 MG/ML SOLN
75.0000 mL | Freq: Once | INTRAVENOUS | Status: AC | PRN
  Administered 2023-03-19: 75 mL via INTRAVENOUS

## 2023-03-19 MED ORDER — DIPHENHYDRAMINE HCL 50 MG/ML IJ SOLN
25.0000 mg | Freq: Once | INTRAMUSCULAR | Status: AC
  Administered 2023-03-19: 25 mg via INTRAVENOUS
  Filled 2023-03-19: qty 1

## 2023-03-19 MED ORDER — LACTATED RINGERS IV BOLUS
1000.0000 mL | Freq: Once | INTRAVENOUS | Status: AC
  Administered 2023-03-19: 1000 mL via INTRAVENOUS

## 2023-03-19 MED ORDER — HYDROMORPHONE HCL 1 MG/ML IJ SOLN
1.0000 mg | Freq: Once | INTRAMUSCULAR | Status: AC
  Administered 2023-03-19: 1 mg via INTRAVENOUS
  Filled 2023-03-19: qty 1

## 2023-03-19 NOTE — ED Triage Notes (Addendum)
 Pt presents to ED with mom from home c/o severe headache to right posterior lower head with pain shooting in neck x 2 weeks ago, tonight is worse, says pain in head is wrapping around to ear, face, head like sharp pins and needles, pt has hx of AA, reports coughing hard yesterday while driving and feeling like he was going to pass out, pt says right hand has been going numb off and on today. Pt also has dizziness, pt reports death of great niece last night also, so increased stress present. Pt says head "hurts just to wear hat"

## 2023-03-19 NOTE — ED Provider Notes (Signed)
 [place] EMERGENCY DEPARTMENT AT [facility] Provider Note   CSN: [id number] Arrival date & time: 03/19/23  2032     History {Add pertinent medical, surgical, social history, OB history to HPI:1} Chief Complaint  Patient presents with  . Headache    Neck pain    Fernando [name] is a 63 y.o. male.  HPI 63 year old male presents with right-sided headache/neck pain.  Symptoms been gradually worsening for a couple weeks.  Started off pretty mild.  No trauma noted.  Pain is primarily to his right occiput and neck and he has been having tingling sensation in his right trapezius.  He feels like his right arm and leg are little weaker.  This is also been going on for 2 weeks.  He denies any fevers but felt like some pictures he was looking at today were moving a little bit for his vision.  However no double vision.  Symptoms acutely have gotten worse over the last several days and got worse when he coughed in the car a few days ago.  He also nearly passed out when he was coughing.  He has not take anything for the headache but it has gotten to the point that it is intolerable.  No neck stiffness.  Home Medications Prior to Admission medications   Medication Sig Start Date End Date Taking? Authorizing Provider  acetaminophen (TYLENOL) 500 MG tablet Take 1,000 mg by mouth every 6 (six) hours as needed for moderate pain.    [provider]  ALPRAZolam Prudy Feeler) 1 MG tablet Take 1 mg by mouth in the morning, at noon, and at bedtime.    [provider]  amLODipine (NORVASC) 5 MG tablet Take 1 tablet (5 mg total) by mouth daily. 06/20/22   Roemhildt, Lorin T, PA-C  cyclobenzaprine (FLEXERIL) 10 MG tablet Take 1 tablet (10 mg total) by mouth 2 (two) times daily as needed for muscle spasms. 11/04/22   Jeanelle Malling, PA  furosemide (LASIX) 20 MG tablet Take 20 mg by mouth daily as needed for edema or fluid.    [provider]  ibuprofen (ADVIL) 200 MG tablet Take 400 mg by  mouth every 6 (six) hours as needed for moderate pain.    [provider]  ketorolac (TORADOL) 10 MG tablet Take 1 tablet (10 mg total) by mouth every 6 (six) hours as needed. 11/04/22   Jeanelle Malling, PA  methocarbamol (ROBAXIN) 500 MG tablet Take 1 tablet (500 mg total) by mouth every 6 (six) hours as needed. 11/01/22   Clois Dupes, PA-C  Multiple Vitamin (MULTI VITAMIN PO) Take 1 tablet by mouth daily.    [provider]  olmesartan (BENICAR) 20 MG tablet Take 20 mg by mouth daily. 09/28/22   [provider]  ondansetron (ZOFRAN) 4 MG tablet Take 1 tablet (4 mg total) by mouth every 8 (eight) hours as needed for nausea or vomiting. 11/01/22 11/01/23  Clois Dupes, PA-C  oxyCODONE (ROXICODONE) 5 MG immediate release tablet Take 1 tablet (5 mg total) by mouth every 4 (four) hours as needed for severe pain (pain score 7-10). 11/01/22   Hill, Alain Honey, PA-C  pantoprazole (PROTONIX) 40 MG tablet Take 1 tablet (40 mg total) by mouth 2 (two) times daily. Patient taking differently: Take 20 mg by mouth 2 (two) times daily. 07/24/22 07/24/23  Lanelle Bal, DO  potassium chloride SA (KLOR-CON M) 20 MEQ tablet Take 20 mEq by mouth daily as needed (cramping).  [provider]      Allergies    Ancef [cefazolin] and Morphine and codeine    Review of Systems   Review of Systems  Constitutional:  Negative for fever.  Eyes:  Positive for visual disturbance.  Respiratory:  Negative for shortness of breath.   Cardiovascular:  Negative for chest pain.  Musculoskeletal:  Positive for neck pain.  Neurological:  Positive for weakness, numbness and headaches.    Physical Exam Updated Vital Signs BP (!) 122/95 (BP Location: Right Arm)   Pulse 90   Temp 97.8 F (36.6 C) (Oral)   Resp (!) 22   Ht 6\' 2"  (1.88 m)   Wt 123.9 kg   SpO2 95%   BMI 35.07 kg/m  Physical Exam Vitals and nursing note reviewed.  Constitutional:      Appearance: He is well-developed.  HENT:      Head: Normocephalic and atraumatic.   Eyes:     Extraocular Movements: Extraocular movements intact.     Pupils: Pupils are equal, round, and reactive to light.  Cardiovascular:     Rate and Rhythm: Normal rate and regular rhythm.     Heart sounds: Normal heart sounds.  Pulmonary:     Effort: Pulmonary effort is normal.     Breath sounds: Normal breath sounds.  Abdominal:     Palpations: Abdomen is soft.     Tenderness: There is no abdominal tenderness.  Musculoskeletal:     Cervical back: No rigidity.  Skin:    General: Skin is warm and dry.  Neurological:     Mental Status: He is alert.     Comments: CN 3-12 grossly intact. 5/5 strength in all 4 extremities. Grossly normal sensation. Normal finger to nose.     ED Results / Procedures / Treatments   Labs (all labs ordered are listed, but only abnormal results are displayed) Labs Reviewed  COMPREHENSIVE METABOLIC PANEL  CBC WITH DIFFERENTIAL/PLATELET    EKG None  Radiology No results found.  Procedures Procedures  {Document cardiac monitor, telemetry assessment procedure when appropriate:1}  Medications Ordered in ED Medications  lactated ringers bolus 1,000 mL (has no administration in time range)  fentaNYL (SUBLIMAZE) injection 50 mcg (has no administration in time range)    ED Course/ Medical Decision Making/ A&P   {   Click here for ABCD2, HEART and other calculatorsREFRESH Note before signing :1}                              Medical Decision Making Amount and/or Complexity of Data Reviewed Labs: ordered. Radiology: ordered.  Risk Prescription drug management.   ***  {Document critical care time when appropriate:1} {Document review of labs and clinical decision tools ie heart score, Chads2Vasc2 etc:1}  {Document your independent review of radiology images, and any outside records:1} {Document your discussion with family members, caretakers, and with consultants:1} {Document social  determinants of health affecting pt's care:1} {Document your decision making why or why not admission, treatments were needed:1} Final Clinical Impression(s) / ED Diagnoses Final diagnoses:  None    Rx / DC Orders ED Discharge Orders     None

## 2023-03-19 NOTE — ED Notes (Signed)
 Patient transported to CT

## 2023-03-20 NOTE — ED Provider Notes (Signed)
  Physical Exam  BP (!) 95/57   Pulse 61   Temp 97.8 F (36.6 C) (Oral)   Resp 13   Ht 6\' 2"  (1.88 m)   Wt 123.9 kg   SpO2 93%   BMI 35.07 kg/m   Physical Exam Vitals and nursing note reviewed.  Constitutional:      Appearance: He is well-developed.  HENT:     Head: Normocephalic and atraumatic.  Neurological:     Mental Status: He is alert and oriented to person, place, and time.     Cranial Nerves: No cranial nerve deficit.     Procedures  Procedures  ED Course / MDM    Medical Decision Making Amount and/or Complexity of Data Reviewed Labs: ordered. Radiology: ordered.  Risk Prescription drug management.   Care assumed from Dr. Criss Alvine at shift change.  Patient presenting here with complaints of head and neck pain as described in Dr. Alric Ran note.  Care signed out to me awaiting results of a CTA of the head and neck.  This study has returned and is unremarkable.  Patient seems to be feeling somewhat better.  He is awake, alert, and neurologically intact and I feel can safely be discharged.  Patient to follow-up with primary doctor if symptoms or not improving.       Fernando Lyons, MD 03/20/23 0040

## 2023-03-20 NOTE — Discharge Instructions (Signed)
 Take ibuprofen 600 mg every 6 hours as needed for pain.  Rest.  Follow-up with primary doctor if symptoms are not improving in the next 3 to 4 days, and return to the ER if symptoms significantly worsen or change.

## 2023-03-28 ENCOUNTER — Ambulatory Visit: Payer: MEDICAID | Admitting: Cardiology

## 2023-03-28 ENCOUNTER — Encounter: Payer: Self-pay | Admitting: Cardiology

## 2023-03-28 NOTE — Progress Notes (Deleted)
 Clinical Summary Fernando [name] is a 63 y.o.male  seen today as a new patient. Last seen by Dr Fernando [name] in 2017   1.History of chest pain - cath 2011 no significant disease - Echocardiogram 08/27/15 normal left ventricular systolic function, LVEF 60-65%, normal regional wall motion, mild mitral regurgitation.  2017 nuclear sterss: no ischemia. Reports significant side effects to dobutamine, "said he felt like he was dying"   - some left sided chest pains at times. Tender to press on, worst with position. No exertioanl chest pain    09/2022 nuclear stress: no ischemia    2.COPD     3. EtOH abuse   4.Preop evaluation - considering knee replacement - DOE with walking short distances, he attributes to his COPD. Has progressed. Some cough, some wheezing. Not using maintence inhaler - DOE with walking 2 flights of stairs.      5. Aortic aneurysm - 4.4 cm ascending aortic aneurysm noted 03/2020 CT PE - 08/2022 CTA: 4.5 cm ascending aortic aneurysm     Truck driver Past Medical History:  Diagnosis Date   Acute respiratory failure with hypoxia (HCC) 08/27/2015   Mild-Oxygen saturation 88% with ambulation.   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     ALPRAZolam (XANAX) 1 MG tablet Take 1 mg by mouth in the morning, at noon, and at bedtime.     amLODipine (NORVASC) 5 MG tablet Take 1 tablet (5 mg total) by mouth daily. 30 tablet 0   cyclobenzaprine (FLEXERIL) 10 MG tablet Take 1 tablet (10 mg total) by mouth 2 (two) times daily as needed for muscle spasms. 20 tablet 0   furosemide (LASIX) 20 MG tablet Take 20 mg by mouth daily as needed for edema or fluid.     ibuprofen (ADVIL) 200 MG tablet Take 400 mg by mouth every 6 (six) hours as needed for moderate pain.     ketorolac (TORADOL) 10 MG tablet Take 1 tablet (10 mg total) by mouth every 6 (six) hours as needed. 10 tablet 0   methocarbamol (ROBAXIN) 500 MG tablet Take 1 tablet (500 mg total) by mouth every 6 (six) hours as needed. 20 tablet 0   Multiple Vitamin (MULTI VITAMIN PO) Take 1 tablet by mouth daily.     olmesartan (BENICAR) 20 MG tablet Take 20 mg by mouth daily.     ondansetron (ZOFRAN) 4  MG tablet Take 1 tablet (4 mg total) by mouth every 8 (eight) hours as needed for nausea or vomiting. 30 tablet 0   oxyCODONE (ROXICODONE) 5 MG immediate release tablet Take 1 tablet (5 mg total) by mouth every 4 (four) hours as needed for severe pain (pain score 7-10). 40 tablet 0   pantoprazole (PROTONIX) 40 MG tablet Take 1 tablet (40 mg total) by mouth 2 (two) times daily. (Patient taking differently: Take 20 mg by mouth 2 (two) times daily.) 60 tablet 11   potassium chloride SA (KLOR-CON M) 20 MEQ tablet Take 20 mEq by mouth daily as needed (cramping).     No current facility-administered medications for this visit.     Past Surgical History:  Procedure Laterality Date   ABSCESS DRAINAGE     APPENDECTOMY     BIOPSY  07/24/2022   Procedure: BIOPSY;  Surgeon: Fernando Bal, DO;  Location: [facility];  Service: Endoscopy;;   CERVICAL  FUSION     COLONOSCOPY     CYSTOSCOPY N/A 05/01/2013   Procedure: CYSTOSCOPY;  Surgeon: Fernando Barban, MD;  Location: [facility];  Service: Urology;  Laterality: N/A;   ESOPHAGOGASTRODUODENOSCOPY (EGD) WITH PROPOFOL N/A 07/24/2022   Procedure: ESOPHAGOGASTRODUODENOSCOPY (EGD) WITH PROPOFOL;  Surgeon: Fernando Bal, DO;  Location: [facility];  Service: Endoscopy;  Laterality: N/A;  8:00am, asa 3   EVALUATION UNDER ANESTHESIA WITH FISTULECTOMY N/A 06/23/2015   Procedure: ANAL EXAM UNDER ANESTHESIA  FISTULOTOMY;  Surgeon: Fernando Levee, MD;  Location: [facility];  Service: General;  Laterality: N/A;   FACIAL COSMETIC SURGERY     HERNIA REPAIR     INCISION AND DRAINAGE ABSCESS Right 05/01/2013   Procedure: INCISION AND DRAINAGE RIGHT SCROTAL ABSCESS;  Surgeon: Fernando Barban, MD;  Location: [facility];  Service: Urology;  Laterality: Right;   INCISION AND DRAINAGE PERIRECTAL ABSCESS     KNEE ARTHROPLASTY Right 11/01/2022   Procedure: COMPUTER ASSISTED TOTAL KNEE ARTHROPLASTY;  Surgeon: Fernando Frederic, MD;  Location: [facility];  Service: Orthopedics;  Laterality: Right;  160 flip room   KNEE ARTHROSCOPY     SPINE SURGERY     cervical   VIDEO ASSISTED THORACOSCOPY (VATS)/ LOBECTOMY Left 05/03/2015   Procedure: LEFT VIDEO ASSISTED THORACOSCOPY (VATS)/ LEFT LOWER LOBECTOMY, ON-Q CATHETER PLACEMENT;  Surgeon: Fernando Slot, MD;  Location: [facility];  Service: Thoracic;  Laterality: Left;   VIDEO BRONCHOSCOPY WITH ENDOBRONCHIAL ULTRASOUND N/A 04/22/2015   Procedure: VIDEO BRONCHOSCOPY WITH ENDOBRONCHIAL ULTRASOUND;  Surgeon: Fernando Slot, MD;  Location: [facility];  Service: Thoracic;  Laterality: N/A;     Allergies  Allergen Reactions   Ancef [Cefazolin] Anaphylaxis   Morphine And Codeine Other (See Comments)    hallucinations      Family History  Problem Relation Age of Onset   Breast cancer Mother    Thyroid disease Mother      Social History Fernando [name] reports  that he has been smoking cigarettes. He started smoking about 23 years ago. He has a 7.5 pack-year smoking history. He has never used smokeless tobacco. Fernando [name] reports current alcohol use.   Review of Systems CONSTITUTIONAL: No weight loss, fever, chills, weakness or fatigue.  HEENT: Eyes: No visual loss, blurred vision, double vision or yellow sclerae.No hearing loss, sneezing, congestion, runny nose or sore throat.  SKIN: No rash or itching.  CARDIOVASCULAR:  RESPIRATORY: No shortness of breath, cough or sputum.  GASTROINTESTINAL: No anorexia, nausea, vomiting or diarrhea.  No abdominal pain or blood.  GENITOURINARY: No burning on urination, no polyuria NEUROLOGICAL: No headache, dizziness, syncope, paralysis, ataxia, numbness or tingling in the extremities. No change in bowel or bladder control.  MUSCULOSKELETAL: No muscle, back pain, joint pain or stiffness.  LYMPHATICS: No enlarged nodes. No history of splenectomy.  PSYCHIATRIC: No history of depression or anxiety.  ENDOCRINOLOGIC: No reports of sweating, cold or heat intolerance. No polyuria or polydipsia.  Marland Kitchen   Physical Examination There were no vitals filed for this visit. There were no vitals filed for this visit.  Gen: resting comfortably, no acute distress HEENT: no scleral icterus, pupils equal round and reactive, no palptable cervical adenopathy,  CV Resp: Clear to auscultation bilaterally GI: abdomen is soft, non-tender, non-distended, normal bowel sounds, no hepatosplenomegaly MSK: extremities are warm, no edema.  Skin: warm, no rash Neuro:  no focal deficits Psych: appropriate affect   Diagnostic Studies  09/2022 nuclear stress The study is normal. There are no perfusion defects consistent with prior infarct or current ischemia. The study is low risk.   No ST deviation was noted.   Moderate size mild intensity inferior defect most prominent in the resting images with normal wall motion. Finding is consistent  with subdiaphragmatic attenuation.   Left ventricular function is normal. Nuclear stress EF: 55%. The left ventricular ejection fraction is normal (55-65%). End diastolic cavity size is normal.   Assessment and Plan  1.Aortic aneurysm - needs repeat CTA, will order   2. History of chest pain - prior testing has been benign with cath in 2011 and stress test 2017 - recent chest pain atypical, monitor at this time   3. DOE - significant DOE with exertion unclear etiology. May be related to his COPD but cannot exclude a cardiac component - plan for lexiscan to further evaluate. Of note no active wheezing today, reports felt terrible with dobutamine in 2017. Had lexi in 2011 without issue. Cannot run on treadmill due to knee pain    4. Preoperative evaluation - considering knee replacement - does not tolerate without symptoms - given his DOE we are planning for lexiscan as listed above. Hold on surgery until results are back.    F/u pending test results.    09/12/22 Addendum Normal stress test. Chest CTA shows moderate aortic aneurysm that will be monitored. Ok to proceed with knee replacement from a cardiac standpoint.       Antoine Poche, M.D., F.A.C.C.

## 2023-11-17 ENCOUNTER — Other Ambulatory Visit: Payer: Self-pay

## 2023-11-17 ENCOUNTER — Emergency Department (HOSPITAL_COMMUNITY): Payer: MEDICAID

## 2023-11-17 ENCOUNTER — Encounter (HOSPITAL_COMMUNITY): Payer: Self-pay

## 2023-11-17 ENCOUNTER — Emergency Department (HOSPITAL_COMMUNITY)
Admission: EM | Admit: 2023-11-17 | Discharge: 2023-11-17 | Disposition: A | Discharge status: Home / Self Care | Payer: MEDICAID

## 2023-11-17 DIAGNOSIS — J40 Bronchitis, not specified as acute or chronic: Secondary | ICD-10-CM | POA: Diagnosis not present

## 2023-11-17 DIAGNOSIS — R051 Acute cough: Secondary | ICD-10-CM

## 2023-11-17 DIAGNOSIS — Z85118 Personal history of other malignant neoplasm of bronchus and lung: Secondary | ICD-10-CM | POA: Insufficient documentation

## 2023-11-17 LAB — RESP PANEL BY RT-PCR (RSV, FLU A&B, COVID)  RVPGX2
Influenza A by PCR: NEGATIVE
Influenza B by PCR: NEGATIVE
Resp Syncytial Virus by PCR: NEGATIVE
SARS Coronavirus 2 by RT PCR: NEGATIVE

## 2023-11-17 MED ORDER — ALBUTEROL SULFATE HFA 108 (90 BASE) MCG/ACT IN AERS
2.0000 | INHALATION_SPRAY | RESPIRATORY_TRACT | Status: DC | PRN
  Administered 2023-11-17: 2 via RESPIRATORY_TRACT
  Filled 2023-11-17: qty 6.7

## 2023-11-17 MED ORDER — PREDNISONE 10 MG PO TABS: 20.0000 mg | ORAL_TABLET | Freq: Every day | ORAL | 0 refills | Status: AC

## 2023-11-17 MED ORDER — AZITHROMYCIN 250 MG PO TABS: 500.0000 mg | ORAL_TABLET | Freq: Every day | ORAL | 0 refills | Status: AC

## 2023-11-17 MED ORDER — IPRATROPIUM-ALBUTEROL 0.5-2.5 (3) MG/3ML IN SOLN
3.0000 mL | Freq: Once | RESPIRATORY_TRACT | Status: AC
  Administered 2023-11-17: 3 mL via RESPIRATORY_TRACT
  Filled 2023-11-17: qty 3

## 2023-11-17 MED ORDER — HYDROCODONE BIT-HOMATROP MBR 5-1.5 MG/5ML PO SOLN
5.0000 mL | Freq: Once | ORAL | Status: AC
  Administered 2023-11-17: 5 mL via ORAL
  Filled 2023-11-17: qty 5

## 2023-11-17 MED ORDER — HYDROCODONE BIT-HOMATROP MBR 5-1.5 MG/5ML PO SOLN: 5.0000 mL | Freq: Four times a day (QID) | ORAL | 0 refills | Status: AC | PRN

## 2023-11-17 NOTE — ED Provider Notes (Signed)
 [place] EMERGENCY DEPARTMENT AT [facility] Provider Note   CSN: [id number] Arrival date & time: 11/17/23  9172     Patient presents with: Cough   Fernando [name] is a 63 y.o. male.   63 year old male presents for evaluation of cough.  He states it started 2 days ago but got worse last night.  Has a history of lung cancer status post lobectomy.  He states he has also had chills as well as fatigue.  He admits to productive cough but has not been coughing up blood.  Denies any chest pain.  States he has some increased shortness of breath from baseline.  Denies any other symptoms or concerns.   Cough Associated symptoms: no chest pain, no chills, no ear pain, no fever, no rash, no shortness of breath and no sore throat        Prior to Admission medications   Medication Sig Start Date End Date Taking? Authorizing Provider  azithromycin (ZITHROMAX) 250 MG tablet Take 2 tablets (500 mg total) by mouth daily for 3 days. Take first 2 tablets together, then 1 every day until finished. 11/17/23 11/20/23 Yes Kolette Vey L, DO  HYDROcodone  bit-homatropine (HYCODAN) 5-1.5 MG/5ML syrup Take 5 mLs by mouth every 6 (six) hours as needed for up to 3 days for cough. 11/17/23 11/20/23 Yes Patrycja Mumpower L, DO  predniSONE  (DELTASONE ) 10 MG tablet Take 2 tablets (20 mg total) by mouth daily for 5 days. 11/17/23 11/22/23 Yes Raysean Graumann L, DO  acetaminophen  (TYLENOL ) 500 MG tablet Take 1,000 mg by mouth every 6 (six) hours as needed for moderate pain.    [provider]  ALPRAZolam  (XANAX ) 1 MG tablet Take 1 mg by mouth in the morning, at noon, and at bedtime.    [provider]  amLODipine  (NORVASC ) 5 MG tablet Take 1 tablet (5 mg total) by mouth daily. 06/20/22   Roemhildt, Lorin T, PA-C  cyclobenzaprine  (FLEXERIL ) 10 MG tablet Take 1 tablet (10 mg total) by mouth 2 (two) times daily as needed for muscle spasms. 11/04/22   Ladora Congress, PA  furosemide (LASIX) 20 MG tablet  Take 20 mg by mouth daily as needed for edema or fluid.    [provider]  ibuprofen  (ADVIL ) 200 MG tablet Take 400 mg by mouth every 6 (six) hours as needed for moderate pain.    [provider]  ketorolac  (TORADOL ) 10 MG tablet Take 1 tablet (10 mg total) by mouth every 6 (six) hours as needed. 11/04/22   Ladora Congress, PA  methocarbamol  (ROBAXIN ) 500 MG tablet Take 1 tablet (500 mg total) by mouth every 6 (six) hours as needed. 11/01/22   Leigh Valery RAMAN, PA-C  Multiple Vitamin (MULTI VITAMIN PO) Take 1 tablet by mouth daily.    [provider]  olmesartan (BENICAR) 20 MG tablet Take 20 mg by mouth daily. 09/28/22   [provider]  oxyCODONE  (ROXICODONE ) 5 MG immediate release tablet Take 1 tablet (5 mg total) by mouth every 4 (four) hours as needed for severe pain (pain score 7-10). 11/01/22   Hill, Valery RAMAN, PA-C  pantoprazole  (PROTONIX ) 40 MG tablet Take 1 tablet (40 mg total) by mouth 2 (two) times daily. Patient taking differently: Take 20 mg by mouth 2 (two) times daily. 07/24/22 07/24/23  Cindie Carlin POUR, DO  potassium chloride  SA (KLOR-CON  M) 20 MEQ tablet Take 20 mEq by mouth daily as needed (cramping).    [provider]  Allergies: Ancef  [cefazolin ] and Morphine and codeine    Review of Systems  Constitutional:  Negative for chills and fever.  HENT:  Negative for ear pain and sore throat.   Eyes:  Negative for pain and visual disturbance.  Respiratory:  Positive for cough. Negative for shortness of breath.   Cardiovascular:  Negative for chest pain and palpitations.  Gastrointestinal:  Negative for abdominal pain and vomiting.  Genitourinary:  Negative for dysuria and hematuria.  Musculoskeletal:  Negative for arthralgias and back pain.  Skin:  Negative for color change and rash.  Neurological:  Negative for seizures and syncope.  All other systems reviewed and are negative.   Updated Vital Signs BP 126/85   Pulse 80   Temp 98.4 F  (36.9 C) (Oral)   Resp (!) 25   Ht 6' 2 (1.88 m)   Wt 117.9 kg   SpO2 93%   BMI 33.38 kg/m   Physical Exam Vitals and nursing note reviewed.  Constitutional:      General: He is not in acute distress.    Appearance: Normal appearance. He is well-developed. He is not ill-appearing.  HENT:     Head: Normocephalic and atraumatic.  Eyes:     Conjunctiva/sclera: Conjunctivae normal.  Cardiovascular:     Rate and Rhythm: Normal rate and regular rhythm.     Pulses: Normal pulses.     Heart sounds: Normal heart sounds. No murmur heard. Pulmonary:     Effort: Pulmonary effort is normal. No respiratory distress.     Breath sounds: Normal breath sounds. No stridor. No wheezing or rhonchi.  Abdominal:     Palpations: Abdomen is soft.     Tenderness: There is no abdominal tenderness.  Musculoskeletal:        General: No swelling.     Cervical back: Neck supple.  Skin:    General: Skin is warm and dry.     Capillary Refill: Capillary refill takes less than 2 seconds.  Neurological:     Mental Status: He is alert.  Psychiatric:        Mood and Affect: Mood normal.     (all labs ordered are listed, but only abnormal results are displayed) Labs Reviewed  RESP PANEL BY RT-PCR (RSV, FLU A&B, COVID)  [id number]    EKG: EKG Interpretation Date/Time:  Saturday November 17 2023 08:37:59 EST Ventricular Rate:  80 PR Interval:  170 QRS Duration:  106 QT Interval:  364 QTC Calculation: 420 R Axis:   -35  Text Interpretation: Sinus rhythm Left axis deviation Low voltage, extremity leads Nonspecific T abnormalities, lateral leads Compared with prior EKG from 03/19/2023 Confirmed by Gennaro Bouchard ([id number]) on 11/17/2023 9:10:55 AM  Radiology: [facility] Chest 2 View Result Date: 11/17/2023 EXAM: 2 VIEW(S) XRAY OF THE CHEST 11/17/2023 09:26:00 AM COMPARISON: 02/28/2023 CLINICAL HISTORY: sob, cough, hx of lung ca s/p resection FINDINGS: LUNGS AND PLEURA: Left lower lobectomy changes with left lung  volume loss. Mild elevation of the left hemidiaphragm, unchanged. No focal pulmonary opacity. No pulmonary edema. No pleural effusion. No pneumothorax. HEART AND MEDIASTINUM: No acute abnormality of the cardiac and mediastinal silhouettes. BONES AND SOFT TISSUES: Orthopedic fixation hardware in the lower cervical spine. No acute osseous abnormality. IMPRESSION: 1. No acute findings. 2. Postoperative changes of left lower lobectomy with associated left lung volume loss and mild elevation of the left hemidiaphragm, unchanged. Electronically signed by: Waddell Calk MD 11/17/2023 09:57 AM EST RP Workstation: [id number]     Procedures  Medications Ordered in the ED  albuterol  (VENTOLIN  HFA) 108 (90 Base) MCG/ACT inhaler 2 puff (2 puffs Inhalation Given 11/17/23 1035)  ipratropium-albuterol  (DUONEB) 0.5-2.5 (3) MG/3ML nebulizer solution 3 mL (3 mLs Nebulization Given 11/17/23 0929)  HYDROcodone  bit-homatropine (HYCODAN) 5-1.5 MG/5ML syrup 5 mL (5 mLs Oral Given 11/17/23 0916)                                    Medical Decision Making Cardiac monitor interpretation: Sinus rhythm, no ectopy  Patient here for cough and bodyaches.  X-ray is unremarkable and vitals are stable.  Gave him a breathing treatment here and some cough medicine he is feeling better.  Will give him a prescription for albuterol  as well as steroids and azithromycin.  COVID flu and RSV are negative.  He does have a history significant for lung cancer status post lobectomy.  Advise close up with primary care and otherwise return to the ER for new or worsening symptoms.  He feels comfortable to plan be discharged home.  Problems Addressed: Acute cough: acute illness or injury Bronchitis: acute illness or injury  Amount and/or Complexity of Data Reviewed External Data Reviewed: notes.    Details: Outpatient records reviewed and patient follows up quite regularly for ongoing neck pain Labs: ordered. Decision-making details documented  in ED Course.    Details: Ordered and reviewed by me and negative for COVID flu and RSV Radiology: ordered and independent interpretation performed. Decision-making details documented in ED Course.    Details: Ordered and interpreted by me independently of radiology Chest x-ray: Shows no acute infiltrate or abnormality ECG/medicine tests: ordered and independent interpretation performed. Decision-making details documented in ED Course.    Details: Ordered and interpreted by me in the absence of cardiology and shows sinus rhythm, no STEMI or significant change when compared to prior  Risk OTC drugs. Prescription drug management.      Final diagnoses:  Bronchitis  Acute cough    ED Discharge Orders          Ordered    HYDROcodone  bit-homatropine (HYCODAN) 5-1.5 MG/5ML syrup  Every 6 hours PRN        11/17/23 1027    azithromycin (ZITHROMAX) 250 MG tablet  Daily        11/17/23 1027    predniSONE  (DELTASONE ) 10 MG tablet  Daily        11/17/23 1027               Cataleah Stites L, DO 11/17/23 1322

## 2023-11-17 NOTE — Discharge Instructions (Addendum)
 Follow-up with your primary care doctor next week.  If your symptoms get worse return to the ER.  Use your Hycodan as needed for cough and your albuterol  as needed for shortness of breath.  Take your steroids and your antibiotics as prescribed.

## 2023-11-17 NOTE — ED Triage Notes (Signed)
 POV from home C/O cough and SOB since last night, weakness and aching all over

## 2023-12-26 ENCOUNTER — Ambulatory Visit: Payer: Self-pay | Admitting: Nurse Practitioner

## 2023-12-26 NOTE — Progress Notes (Deleted)
 Subjective:  Patient ID: Fernando [name], male    DOB: Jan 02, 1961, 63 y.o.   MRN: [id number]  Patient Care Team: Deitra Morton Sebastian Nena, NP as PCP - General (Nurse Practitioner) Alvan Dorn FALCON, MD as PCP - Cardiology (Cardiology)   Chief Complaint:  Establish Care   HPI: [name] is a 63 y.o. male presenting on 12/26/2023 for Establish Care   Discussed the use of AI scribe software for clinical note transcription with the patient, who gave verbal consent to proceed.  History of Present Illness       Relevant past medical, surgical, family, and social history reviewed and updated as indicated.  Allergies and medications reviewed and updated. Data reviewed: Chart in Epic.   Past Medical History:  Diagnosis Date   Acute respiratory failure with hypoxia (HCC) 08/27/2015   Mild-Oxygen  saturation 88% with ambulation.   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   Tobacco abuse    Umbilical hernia    has been repaired    Past Surgical History:  Procedure Laterality Date   ABSCESS DRAINAGE     APPENDECTOMY     BIOPSY  07/24/2022   Procedure: BIOPSY;  Surgeon: Cindie Carlin POUR, DO;  [facility];  Service: Endoscopy;;   CERVICAL FUSION     COLONOSCOPY     CYSTOSCOPY N/A 05/01/2013   Procedure: CYSTOSCOPY;  Surgeon: Emery LILLETTE Blaze, MD;  Location: [facility];  Service: Urology;  Laterality: N/A;   ESOPHAGOGASTRODUODENOSCOPY (EGD) WITH PROPOFOL  N/A  07/24/2022   Procedure: ESOPHAGOGASTRODUODENOSCOPY (EGD) WITH PROPOFOL ;  Surgeon: Cindie Carlin POUR, DO;  [facility];  Service: Endoscopy;  Laterality: N/A;  8:00am, asa 3   EVALUATION UNDER ANESTHESIA WITH FISTULECTOMY N/A 06/23/2015   Procedure: ANAL EXAM UNDER ANESTHESIA  FISTULOTOMY;  Surgeon: Bernarda Ned, MD;  Location: [facility];  Service: General;  Laterality: N/A;   FACIAL COSMETIC SURGERY     HERNIA REPAIR     INCISION AND DRAINAGE ABSCESS Right 05/01/2013   Procedure: INCISION AND DRAINAGE RIGHT SCROTAL ABSCESS;  Surgeon: Emery LILLETTE Blaze, MD;  Location: [facility];  Service: Urology;  Laterality: Right;   INCISION AND DRAINAGE PERIRECTAL ABSCESS     KNEE ARTHROPLASTY Right 11/01/2022   Procedure: COMPUTER ASSISTED TOTAL KNEE ARTHROPLASTY;  Surgeon: Fidel Redell, MD;  Location: [facility];  Service: Orthopedics;  Laterality: Right;  160 flip room   KNEE ARTHROSCOPY     SPINE SURGERY     cervical   VIDEO ASSISTED THORACOSCOPY (VATS)/ LOBECTOMY Left 05/03/2015   Procedure: LEFT VIDEO ASSISTED THORACOSCOPY (VATS)/ LEFT LOWER LOBECTOMY, ON-Q CATHETER PLACEMENT;  Surgeon: Elspeth JAYSON Millers, MD;  [facility];  Service: Thoracic;  Laterality: Left;   VIDEO BRONCHOSCOPY WITH ENDOBRONCHIAL ULTRASOUND N/A 04/22/2015   Procedure: VIDEO BRONCHOSCOPY WITH  ENDOBRONCHIAL ULTRASOUND;  Surgeon: Elspeth JAYSON Millers, MD;  Location: [facility];  Service: Thoracic;  Laterality: N/A;    Social History   Socioeconomic History   Marital status: Divorced    Spouse name: Not on file   Number of children: 2   Years of education: 10   Highest education level: Not on file  Occupational History   Occupation: N/A  Tobacco Use   Smoking status: Some Days    Current packs/day: 0.00    Average packs/day: 0.5 packs/day for 15.0 years (7.5 ttl pk-yrs)    Types: Cigarettes    Start date: 03/04/2000    Last attempt to quit: 03/05/2015    Years since quitting: 8.8   Smokeless  tobacco: Never  Vaping Use   Vaping status: Never Used  Substance and Sexual Activity   Alcohol  use: Yes    Comment: occasionally   Drug use: No   Sexual activity: Never    Birth control/protection: None, Abstinence  Other Topics Concern   Not on file  Social History Narrative   Rare caffeine use    Social Drivers of Health   Tobacco Use: High Risk (11/17/2023)   Patient History    Smoking Tobacco Use: Some Days    Smokeless Tobacco Use: Never    Passive Exposure: Not on file  Financial Resource Strain: Low Risk (03/22/2023)   Received from [facility]   Overall Financial Resource Strain (CARDIA)    Difficulty of Paying Living Expenses: Not hard at all  Food Insecurity: Patient Declined (10/01/2023)   Received from [facility]   Epic    Within the past 12 months, you worried that your food would run out before you got the money to buy more.: Patient declined    Within the past 12 months, the food you bought just didn't last and you didn't have money to get more.: Patient declined  Transportation Needs: No Transportation Needs (10/01/2023)   Received from [facility]   Epic    In the past 12 months, has lack of transportation kept you from medical appointments or from getting medications?: No    In the past 12 months, has lack of transportation kept you from meetings, work, or from getting things needed for daily living?: No  Physical Activity: Not on file  Stress: Not on file  Social Connections: Not on file  Intimate Partner Violence: Not At Risk (05/29/2023)   Received from [facility]   HITS    Over the last 12 months how often did your partner physically hurt you?: Never    Over the last 12 months how often did your partner insult you or talk down to you?: Never    Over the last 12 months how often did your partner threaten you with physical harm?: Never    Over the last 12 months how often did your partner scream or curse at you?: Never  Depression (PHQ2-9): Not  on file  Alcohol  Screen: Not on file  Housing: Unknown (10/01/2023)   Received from [facility]    In the last 12 months, was there a time when you were not able to pay the mortgage or rent on time?: No    Number of Times Moved in the Last Year: Not on file    At any time in the past 12 months, were you homeless or living in a shelter (including now)?: No  Utilities: Not At Risk (10/01/2023)   Received from [facility]  Epic    In the past 12 months has the electric, gas, oil, or water  company threatened to shut off services in your home?: No  Health Literacy: Not on file    Outpatient Encounter Medications as of 12/26/2023  Medication Sig   acetaminophen  (TYLENOL ) 500 MG tablet Take 1,000 mg by mouth every 6 (six) hours as needed for moderate pain.   ALPRAZolam  (XANAX ) 1 MG tablet Take 1 mg by mouth in the morning, at noon, and at bedtime.   amLODipine  (NORVASC ) 5 MG tablet Take 1 tablet (5 mg total) by mouth daily.   cyclobenzaprine  (FLEXERIL ) 10 MG tablet Take 1 tablet (10 mg total) by mouth 2 (two) times daily as needed for muscle spasms.   furosemide (LASIX) 20 MG tablet Take 20 mg by mouth daily as needed for edema or fluid.   ibuprofen  (ADVIL ) 200 MG tablet Take 400 mg by mouth every 6 (six) hours as needed for moderate pain.   ketorolac  (TORADOL ) 10 MG tablet Take 1 tablet (10 mg total) by mouth every 6 (six) hours as needed.   methocarbamol  (ROBAXIN ) 500 MG tablet Take 1 tablet (500 mg total) by mouth every 6 (six) hours as needed.   Multiple Vitamin (MULTI VITAMIN PO) Take 1 tablet by mouth daily.   olmesartan (BENICAR) 20 MG tablet Take 20 mg by mouth daily.   oxyCODONE  (ROXICODONE ) 5 MG immediate release tablet Take 1 tablet (5 mg total) by mouth every 4 (four) hours as needed for severe pain (pain score 7-10).   pantoprazole  (PROTONIX ) 40 MG tablet Take 1 tablet (40 mg total) by mouth 2 (two) times daily. (Patient taking differently: Take 20 mg by mouth 2 (two)  times daily.)   potassium chloride  SA (KLOR-CON  M) 20 MEQ tablet Take 20 mEq by mouth daily as needed (cramping).   No facility-administered encounter medications on file as of 12/26/2023.    Allergies[1]  Pertinent ROS per HPI, otherwise unremarkable      Objective:  There were no vitals taken for this visit.   Wt Readings from Last 3 Encounters:  11/17/23 260 lb (117.9 kg)  03/19/23 273 lb 2.4 oz (123.9 kg)  11/04/22 273 lb 2.4 oz (123.9 kg)    Physical Exam Physical Exam      Results for orders placed or performed during the hospital encounter of 11/17/23  Resp panel by RT-PCR (RSV, Flu A&B, Covid) Anterior Nasal Swab   Collection Time: 11/17/23  8:45 AM   Specimen: Anterior Nasal Swab  Result Value Ref Range   SARS Coronavirus 2 by RT PCR NEGATIVE NEGATIVE   Influenza A by PCR NEGATIVE NEGATIVE   Influenza B by PCR NEGATIVE NEGATIVE   Resp Syncytial Virus by PCR NEGATIVE NEGATIVE       Pertinent labs & imaging results that were available during my care of the patient were reviewed by me and considered in my medical decision making.  Assessment & Plan:  Jarrah was seen today for establish care.  Diagnoses and all orders for this visit:  Encounter for general adult medical examination with abnormal findings  Primary hypertension  Other specified hypothyroidism     Assessment and Plan Assessment & Plan       Continue all other maintenance medications.  Follow up plan: No follow-ups on file.   Continue healthy lifestyle choices, including diet (rich in fruits, vegetables, and lean proteins, and low in salt and simple carbohydrates) and exercise (at least 30 minutes of moderate physical activity daily).  Educational handout given for ***  The above assessment and management plan was discussed with the patient. The patient verbalized understanding of and has agreed to the management plan. Patient is aware to call the clinic if they develop any new  symptoms or if symptoms persist or worsen. Patient is aware when to return to the clinic for a follow-up visit. Patient educated on when it is appropriate to go to the emergency department.   Nena Cassis Morton Hummer, DNP [facility] [address] [place], [place] [address] [phone]       [1]  Allergies Allergen Reactions   Ancef  [Cefazolin ] Anaphylaxis   Morphine And Codeine Other (See Comments)    hallucinations

## 2023-12-26 NOTE — Progress Notes (Deleted)
 Subjective:  Patient ID: Fernando [name], male    DOB: 1960/10/18, 63 y.o.   MRN: [id number]  Patient Care Team: Deitra Morton Hummer, Nena, NP as PCP - General (Nurse Practitioner) Alvan Dorn FALCON, MD as PCP - Cardiology (Cardiology)   Chief Complaint:  No chief complaint on file.   HPI: Fernando [name] is a 63 y.o. male presenting on 12/26/2023 for No chief complaint on file.   Discussed the use of AI scribe software for clinical note transcription with the patient, who gave verbal consent to proceed.  History of Present Illness       Relevant past medical, surgical, family, and social history reviewed and updated as indicated.  Allergies and medications reviewed and updated. Data reviewed: Chart in Epic.   Past Medical History:  Diagnosis Date   Acute respiratory failure with hypoxia (HCC) 08/27/2015   Mild-Oxygen  saturation 88% with ambulation.   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   Tobacco abuse    Umbilical hernia    has been repaired    Past Surgical History:  Procedure Laterality Date   ABSCESS DRAINAGE     APPENDECTOMY     BIOPSY  07/24/2022   Procedure: BIOPSY;  Surgeon: Cindie Carlin POUR, DO;  [facility];  Service: Endoscopy;;   CERVICAL FUSION     COLONOSCOPY     CYSTOSCOPY N/A 05/01/2013   Procedure: CYSTOSCOPY;  Surgeon: Emery LILLETTE Blaze, MD;  [facility];  Service: Urology;  Laterality: N/A;   ESOPHAGOGASTRODUODENOSCOPY (EGD)  WITH PROPOFOL  N/A 07/24/2022   Procedure: ESOPHAGOGASTRODUODENOSCOPY (EGD) WITH PROPOFOL ;  Surgeon: Cindie Carlin POUR, DO;  [facility];  Service: Endoscopy;  Laterality: N/A;  8:00am, asa 3   EVALUATION UNDER ANESTHESIA WITH FISTULECTOMY N/A 06/23/2015   Procedure: ANAL EXAM UNDER ANESTHESIA  FISTULOTOMY;  Surgeon: Bernarda Ned, MD;  Location: [facility];  Service: General;  Laterality: N/A;   FACIAL COSMETIC SURGERY     HERNIA REPAIR     INCISION AND DRAINAGE ABSCESS Right 05/01/2013   Procedure: INCISION AND DRAINAGE RIGHT SCROTAL ABSCESS;  Surgeon: Emery LILLETTE Blaze, MD;  [facility]: [facility];  Service: Urology;  Laterality: Right;   INCISION AND DRAINAGE PERIRECTAL ABSCESS     KNEE ARTHROPLASTY Right 11/01/2022   Procedure: COMPUTER ASSISTED TOTAL KNEE ARTHROPLASTY;  Surgeon: Fidel Redell, MD;  Location: [facility];  Service: Orthopedics;  Laterality: Right;  160 flip room   KNEE ARTHROSCOPY     SPINE SURGERY     cervical   VIDEO ASSISTED THORACOSCOPY (VATS)/ LOBECTOMY Left 05/03/2015   Procedure: LEFT VIDEO ASSISTED THORACOSCOPY (VATS)/ LEFT LOWER LOBECTOMY, ON-Q CATHETER PLACEMENT;  Surgeon: Elspeth JAYSON Millers, MD;  Location[facility];  Service: Thoracic;  Laterality: Left;   VIDEO BRONCHOSCOPY WITH ENDOBRONCHIAL ULTRASOUND N/A 04/22/2015  Procedure: VIDEO BRONCHOSCOPY WITH ENDOBRONCHIAL ULTRASOUND;  Surgeon: Elspeth JAYSON Millers, MD;  Location: [facility];  Service: Thoracic;  Laterality: N/A;    Social History   Socioeconomic History   Marital status: Divorced    Spouse name: Not on file   Number of children: 2   Years of education: 10   Highest education level: Not on file  Occupational History   Occupation: N/A  Tobacco Use   Smoking status: Some Days    Current packs/day: 0.00    Average packs/day: 0.5 packs/day for 15.0 years (7.5 ttl pk-yrs)    Types: Cigarettes    Start date: 03/04/2000    Last attempt to quit: 03/05/2015    Years since quitting: 8.8    Smokeless tobacco: Never  Vaping Use   Vaping status: Never Used  Substance and Sexual Activity   Alcohol  use: Yes    Comment: occasionally   Drug use: No   Sexual activity: Never    Birth control/protection: None, Abstinence  Other Topics Concern   Not on file  Social History Narrative   Rare caffeine use    Social Drivers of Health   Tobacco Use: High Risk (11/17/2023)   Patient History    Smoking Tobacco Use: Some Days    Smokeless Tobacco Use: Never    Passive Exposure: Not on file  Financial Resource Strain: Low Risk (03/22/2023)   Received from [facility]   Overall Financial Resource Strain (CARDIA)    Difficulty of Paying Living Expenses: Not hard at all  Food Insecurity: Patient Declined (10/01/2023)   Received from [facility]   Epic    Within the past 12 months, you worried that your food would run out before you got the money to buy more.: Patient declined    Within the past 12 months, the food you bought just didn't last and you didn't have money to get more.: Patient declined  Transportation Needs: No Transportation Needs (10/01/2023)   Received from [facility]   Epic    In the past 12 months, has lack of transportation kept you from medical appointments or from getting medications?: No    In the past 12 months, has lack of transportation kept you from meetings, work, or from getting things needed for daily living?: No  Physical Activity: Not on file  Stress: Not on file  Social Connections: Not on file  Intimate Partner Violence: Not At Risk (05/29/2023)   Received from [facility]   HITS    Over the last 12 months how often did your partner physically hurt you?: Never    Over the last 12 months how often did your partner insult you or talk down to you?: Never    Over the last 12 months how often did your partner threaten you with physical harm?: Never    Over the last 12 months how often did your partner scream or curse at you?: Never  Depression  (PHQ2-9): Not on file  Alcohol  Screen: Not on file  Housing: Unknown (10/01/2023)   Received [facility]    In the last 12 months, was there a time when you were not able to pay the mortgage or rent on time?: No    Number of Times Moved in the Last Year: Not on file    At any time in the past 12 months, were you homeless or living in a shelter (including now)?: No  Utilities: Not At Risk (10/01/2023)   Received  from [facility]    In the past 12 months has the electric, gas, oil, or water  company threatened to shut off services in your home?: No  Health Literacy: Not on file    Outpatient Encounter Medications as of 12/26/2023  Medication Sig   acetaminophen  (TYLENOL ) 500 MG tablet Take 1,000 mg by mouth every 6 (six) hours as needed for moderate pain.   ALPRAZolam  (XANAX ) 1 MG tablet Take 1 mg by mouth in the morning, at noon, and at bedtime.   amLODipine  (NORVASC ) 5 MG tablet Take 1 tablet (5 mg total) by mouth daily.   cyclobenzaprine  (FLEXERIL ) 10 MG tablet Take 1 tablet (10 mg total) by mouth 2 (two) times daily as needed for muscle spasms.   furosemide (LASIX) 20 MG tablet Take 20 mg by mouth daily as needed for edema or fluid.   ibuprofen  (ADVIL ) 200 MG tablet Take 400 mg by mouth every 6 (six) hours as needed for moderate pain.   ketorolac  (TORADOL ) 10 MG tablet Take 1 tablet (10 mg total) by mouth every 6 (six) hours as needed.   methocarbamol  (ROBAXIN ) 500 MG tablet Take 1 tablet (500 mg total) by mouth every 6 (six) hours as needed.   Multiple Vitamin (MULTI VITAMIN PO) Take 1 tablet by mouth daily.   olmesartan (BENICAR) 20 MG tablet Take 20 mg by mouth daily.   oxyCODONE  (ROXICODONE ) 5 MG immediate release tablet Take 1 tablet (5 mg total) by mouth every 4 (four) hours as needed for severe pain (pain score 7-10).   pantoprazole  (PROTONIX ) 40 MG tablet Take 1 tablet (40 mg total) by mouth 2 (two) times daily. (Patient taking differently: Take 20 mg by  mouth 2 (two) times daily.)   potassium chloride  SA (KLOR-CON  M) 20 MEQ tablet Take 20 mEq by mouth daily as needed (cramping).   No facility-administered encounter medications on file as of 12/26/2023.    Allergies[1]  Pertinent ROS per HPI, otherwise unremarkable      Objective:  There were no vitals taken for this visit.   Wt Readings from Last 3 Encounters:  11/17/23 260 lb (117.9 kg)  03/19/23 273 lb 2.4 oz (123.9 kg)  11/04/22 273 lb 2.4 oz (123.9 kg)    Physical Exam Physical Exam      Results for orders placed or performed during the hospital encounter of 11/17/23  Resp panel by RT-PCR (RSV, Flu A&B, Covid) Anterior Nasal Swab   Collection Time: 11/17/23  8:45 AM   Specimen: Anterior Nasal Swab  Result Value Ref Range   SARS Coronavirus 2 by RT PCR NEGATIVE NEGATIVE   Influenza A by PCR NEGATIVE NEGATIVE   Influenza B by PCR NEGATIVE NEGATIVE   Resp Syncytial Virus by PCR NEGATIVE NEGATIVE       Pertinent labs & imaging results that were available during my care of the patient were reviewed by me and considered in my medical decision making.  Assessment & Plan:  Diagnoses and all orders for this visit:  Encounter for general adult medical examination with abnormal findings  Primary hypertension  Other specified hypothyroidism     Assessment and Plan Assessment & Plan       Continue all other maintenance medications.  Follow up plan: No follow-ups on file.   Continue healthy lifestyle choices, including diet (rich in fruits, vegetables, and lean proteins, and low in salt and simple carbohydrates) and exercise (at least 30 minutes of moderate physical activity daily).  Educational handout  given for ***  The above assessment and management plan was discussed with the patient. The patient verbalized understanding of and has agreed to the management plan. Patient is aware to call the clinic if they develop any new symptoms or if symptoms persist  or worsen. Patient is aware when to return to the clinic for a follow-up visit. Patient educated on when it is appropriate to go to the emergency department.   Nena Cassis Morton Hummer, DNP [facility] [address] [place], [place] [address] [phone]      [1]  Allergies Allergen Reactions   Ancef  [Cefazolin ] Anaphylaxis   Morphine And Codeine Other (See Comments)    hallucinations
